# Patient Record
Sex: Female | Born: 1945 | Race: Black or African American | Hispanic: No | Smoking: Never smoker
Health system: Southern US, Community
[De-identification: ages and names within clinical notes are randomized; demographics above are authoritative.]

## PROBLEM LIST (undated history)

## (undated) DIAGNOSIS — K219 Gastro-esophageal reflux disease without esophagitis: Secondary | ICD-10-CM

## (undated) DIAGNOSIS — N183 Chronic kidney disease, stage 3 unspecified: Secondary | ICD-10-CM

## (undated) DIAGNOSIS — I10 Essential (primary) hypertension: Secondary | ICD-10-CM

## (undated) DIAGNOSIS — R011 Cardiac murmur, unspecified: Secondary | ICD-10-CM

## (undated) DIAGNOSIS — Z972 Presence of dental prosthetic device (complete) (partial): Secondary | ICD-10-CM

## (undated) DIAGNOSIS — E785 Hyperlipidemia, unspecified: Secondary | ICD-10-CM

## (undated) DIAGNOSIS — R413 Other amnesia: Secondary | ICD-10-CM

## (undated) DIAGNOSIS — E669 Obesity, unspecified: Secondary | ICD-10-CM

## (undated) DIAGNOSIS — G4730 Sleep apnea, unspecified: Secondary | ICD-10-CM

## (undated) DIAGNOSIS — I2699 Other pulmonary embolism without acute cor pulmonale: Secondary | ICD-10-CM

## (undated) DIAGNOSIS — S63509A Unspecified sprain of unspecified wrist, initial encounter: Secondary | ICD-10-CM

## (undated) DIAGNOSIS — S66919A Strain of unspecified muscle, fascia and tendon at wrist and hand level, unspecified hand, initial encounter: Secondary | ICD-10-CM

## (undated) DIAGNOSIS — M169 Osteoarthritis of hip, unspecified: Secondary | ICD-10-CM

## (undated) DIAGNOSIS — Z973 Presence of spectacles and contact lenses: Secondary | ICD-10-CM

## (undated) DIAGNOSIS — Z9989 Dependence on other enabling machines and devices: Secondary | ICD-10-CM

## (undated) DIAGNOSIS — I82409 Acute embolism and thrombosis of unspecified deep veins of unspecified lower extremity: Secondary | ICD-10-CM

## (undated) DIAGNOSIS — D649 Anemia, unspecified: Secondary | ICD-10-CM

## (undated) HISTORY — DX: Acute embolism and thrombosis of unspecified deep veins of unspecified lower extremity: I82.409

## (undated) HISTORY — DX: Hyperlipidemia, unspecified: E78.5

## (undated) HISTORY — PX: SHOULDER SURGERY: SHX246

## (undated) HISTORY — DX: Essential (primary) hypertension: I10

## (undated) HISTORY — DX: Osteoarthritis of hip, unspecified: M16.9

## (undated) HISTORY — PX: TRANSTHORACIC ECHOCARDIOGRAM: SHX275

## (undated) HISTORY — DX: Obesity, unspecified: E66.9

## (undated) HISTORY — DX: Chronic kidney disease, stage 3 (moderate): N18.3

## (undated) HISTORY — DX: Other pulmonary embolism without acute cor pulmonale: I26.99

## (undated) HISTORY — PX: PARTIAL HIP ARTHROPLASTY: SHX733

## (undated) HISTORY — PX: TOTAL HIP ARTHROPLASTY: SHX124

---
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
## 1979-08-06 HISTORY — PX: TUBAL LIGATION: SHX77

## 1999-06-23 ENCOUNTER — Encounter: Payer: Self-pay | Admitting: Internal Medicine

## 1999-06-23 ENCOUNTER — Ambulatory Visit (HOSPITAL_COMMUNITY): Admission: RE | Admit: 1999-06-23 | Discharge: 1999-06-23 | Payer: Self-pay | Admitting: Internal Medicine

## 1999-07-23 ENCOUNTER — Encounter: Payer: Self-pay | Admitting: Emergency Medicine

## 1999-07-23 ENCOUNTER — Emergency Department (HOSPITAL_COMMUNITY): Admission: EM | Admit: 1999-07-23 | Discharge: 1999-07-23 | Payer: Self-pay | Admitting: Emergency Medicine

## 1999-10-11 ENCOUNTER — Encounter: Payer: Self-pay | Admitting: Internal Medicine

## 1999-10-11 ENCOUNTER — Ambulatory Visit (HOSPITAL_COMMUNITY): Admission: RE | Admit: 1999-10-11 | Discharge: 1999-10-11 | Payer: Self-pay | Admitting: Internal Medicine

## 2000-02-15 ENCOUNTER — Other Ambulatory Visit: Admission: RE | Admit: 2000-02-15 | Discharge: 2000-02-15 | Payer: Self-pay | Admitting: Family Medicine

## 2000-09-16 ENCOUNTER — Ambulatory Visit (HOSPITAL_COMMUNITY): Admission: RE | Admit: 2000-09-16 | Discharge: 2000-09-16 | Payer: Self-pay | Admitting: Family Medicine

## 2001-08-25 ENCOUNTER — Encounter: Payer: Self-pay | Admitting: Emergency Medicine

## 2001-08-25 ENCOUNTER — Emergency Department (HOSPITAL_COMMUNITY): Admission: EM | Admit: 2001-08-25 | Discharge: 2001-08-26 | Payer: Self-pay | Admitting: Emergency Medicine

## 2002-02-25 ENCOUNTER — Encounter: Payer: Self-pay | Admitting: Internal Medicine

## 2002-02-25 ENCOUNTER — Ambulatory Visit (HOSPITAL_COMMUNITY): Admission: RE | Admit: 2002-02-25 | Discharge: 2002-02-25 | Payer: Self-pay | Admitting: Internal Medicine

## 2002-12-04 HISTORY — PX: OTHER SURGICAL HISTORY: SHX169

## 2003-03-04 ENCOUNTER — Encounter: Payer: Self-pay | Admitting: Family Medicine

## 2003-03-04 ENCOUNTER — Ambulatory Visit (HOSPITAL_COMMUNITY): Admission: RE | Admit: 2003-03-04 | Discharge: 2003-03-04 | Payer: Self-pay | Admitting: Family Medicine

## 2003-04-15 ENCOUNTER — Encounter: Payer: Self-pay | Admitting: Orthopedic Surgery

## 2003-04-18 ENCOUNTER — Inpatient Hospital Stay (HOSPITAL_COMMUNITY): Admission: RE | Admit: 2003-04-18 | Discharge: 2003-04-22 | Payer: Self-pay | Admitting: Orthopedic Surgery

## 2003-04-18 ENCOUNTER — Encounter: Payer: Self-pay | Admitting: Orthopedic Surgery

## 2003-04-22 ENCOUNTER — Inpatient Hospital Stay (HOSPITAL_COMMUNITY)
Admission: RE | Admit: 2003-04-22 | Discharge: 2003-04-29 | Payer: Self-pay | Admitting: Physical Medicine & Rehabilitation

## 2003-09-01 ENCOUNTER — Inpatient Hospital Stay (HOSPITAL_COMMUNITY): Admission: EM | Admit: 2003-09-01 | Discharge: 2003-09-29 | Payer: Self-pay | Admitting: Emergency Medicine

## 2003-09-04 ENCOUNTER — Encounter: Payer: Self-pay | Admitting: Cardiovascular Disease

## 2003-10-03 ENCOUNTER — Encounter: Admission: RE | Admit: 2003-10-03 | Discharge: 2003-10-03 | Payer: Self-pay | Admitting: Family Medicine

## 2003-10-17 ENCOUNTER — Encounter: Admission: RE | Admit: 2003-10-17 | Discharge: 2003-10-17 | Payer: Self-pay | Admitting: Sports Medicine

## 2003-11-03 HISTORY — PX: OTHER SURGICAL HISTORY: SHX169

## 2004-01-26 ENCOUNTER — Encounter: Admission: RE | Admit: 2004-01-26 | Discharge: 2004-01-26 | Payer: Self-pay | Admitting: Family Medicine

## 2004-02-02 ENCOUNTER — Encounter: Admission: RE | Admit: 2004-02-02 | Discharge: 2004-02-02 | Payer: Self-pay | Admitting: Family Medicine

## 2004-02-07 ENCOUNTER — Encounter: Admission: RE | Admit: 2004-02-07 | Discharge: 2004-02-07 | Payer: Self-pay | Admitting: Sports Medicine

## 2004-05-01 ENCOUNTER — Ambulatory Visit: Payer: Self-pay | Admitting: Family Medicine

## 2004-06-08 ENCOUNTER — Ambulatory Visit: Payer: Self-pay | Admitting: Cardiovascular Disease

## 2004-06-18 ENCOUNTER — Ambulatory Visit: Payer: Self-pay | Admitting: Cardiology

## 2004-07-02 ENCOUNTER — Ambulatory Visit: Payer: Self-pay | Admitting: Internal Medicine

## 2004-07-09 ENCOUNTER — Ambulatory Visit: Payer: Self-pay | Admitting: Cardiology

## 2004-07-16 ENCOUNTER — Ambulatory Visit: Payer: Self-pay | Admitting: Cardiology

## 2004-08-02 ENCOUNTER — Ambulatory Visit: Payer: Self-pay | Admitting: Cardiovascular Disease

## 2004-08-16 ENCOUNTER — Ambulatory Visit: Payer: Self-pay | Admitting: Internal Medicine

## 2004-08-23 ENCOUNTER — Ambulatory Visit: Payer: Self-pay | Admitting: Cardiology

## 2004-08-30 ENCOUNTER — Ambulatory Visit: Payer: Self-pay | Admitting: Sports Medicine

## 2004-09-03 ENCOUNTER — Ambulatory Visit: Payer: Self-pay | Admitting: Internal Medicine

## 2004-09-05 ENCOUNTER — Ambulatory Visit: Payer: Self-pay | Admitting: Family Medicine

## 2004-09-20 ENCOUNTER — Ambulatory Visit: Payer: Self-pay | Admitting: Cardiology

## 2004-10-04 ENCOUNTER — Ambulatory Visit: Payer: Self-pay | Admitting: Internal Medicine

## 2005-01-21 ENCOUNTER — Ambulatory Visit: Payer: Self-pay | Admitting: Family Medicine

## 2005-01-21 ENCOUNTER — Ambulatory Visit: Payer: Self-pay | Admitting: Cardiology

## 2005-01-30 ENCOUNTER — Ambulatory Visit: Payer: Self-pay | Admitting: Cardiology

## 2005-02-04 ENCOUNTER — Ambulatory Visit: Payer: Self-pay | Admitting: Cardiology

## 2005-02-12 ENCOUNTER — Ambulatory Visit: Payer: Self-pay | Admitting: *Deleted

## 2005-02-26 ENCOUNTER — Ambulatory Visit: Payer: Self-pay | Admitting: Cardiology

## 2005-03-12 ENCOUNTER — Ambulatory Visit: Payer: Self-pay | Admitting: Cardiology

## 2005-03-19 ENCOUNTER — Ambulatory Visit: Payer: Self-pay | Admitting: Cardiology

## 2005-03-25 ENCOUNTER — Ambulatory Visit: Payer: Self-pay | Admitting: Cardiology

## 2005-04-16 ENCOUNTER — Ambulatory Visit: Payer: Self-pay | Admitting: *Deleted

## 2005-05-07 ENCOUNTER — Ambulatory Visit: Payer: Self-pay | Admitting: Internal Medicine

## 2005-07-24 ENCOUNTER — Ambulatory Visit: Payer: Self-pay | Admitting: Internal Medicine

## 2005-08-21 ENCOUNTER — Ambulatory Visit: Payer: Self-pay | Admitting: Cardiology

## 2005-08-28 ENCOUNTER — Ambulatory Visit: Payer: Self-pay | Admitting: Cardiology

## 2005-09-27 ENCOUNTER — Ambulatory Visit: Payer: Self-pay | Admitting: Internal Medicine

## 2005-11-14 ENCOUNTER — Ambulatory Visit: Payer: Self-pay | Admitting: Cardiology

## 2005-11-22 ENCOUNTER — Ambulatory Visit: Payer: Self-pay | Admitting: Family Medicine

## 2005-12-09 ENCOUNTER — Encounter (INDEPENDENT_AMBULATORY_CARE_PROVIDER_SITE_OTHER): Payer: Self-pay | Admitting: *Deleted

## 2005-12-09 LAB — CONVERTED CEMR LAB

## 2005-12-23 ENCOUNTER — Ambulatory Visit: Payer: Self-pay | Admitting: Family Medicine

## 2005-12-31 ENCOUNTER — Ambulatory Visit: Payer: Self-pay | Admitting: Sports Medicine

## 2006-01-02 ENCOUNTER — Encounter: Admission: RE | Admit: 2006-01-02 | Discharge: 2006-01-02 | Payer: Self-pay | Admitting: Sports Medicine

## 2006-01-15 ENCOUNTER — Ambulatory Visit: Payer: Self-pay | Admitting: Family Medicine

## 2006-01-29 ENCOUNTER — Ambulatory Visit: Payer: Self-pay | Admitting: Family Medicine

## 2006-02-10 ENCOUNTER — Ambulatory Visit: Payer: Self-pay | Admitting: Family Medicine

## 2006-02-26 ENCOUNTER — Ambulatory Visit: Payer: Self-pay | Admitting: Sports Medicine

## 2006-03-05 ENCOUNTER — Ambulatory Visit: Payer: Self-pay | Admitting: Family Medicine

## 2006-03-18 ENCOUNTER — Ambulatory Visit: Payer: Self-pay | Admitting: Family Medicine

## 2006-03-23 ENCOUNTER — Emergency Department (HOSPITAL_COMMUNITY): Admission: EM | Admit: 2006-03-23 | Discharge: 2006-03-23 | Payer: Self-pay | Admitting: Emergency Medicine

## 2006-03-25 ENCOUNTER — Ambulatory Visit: Payer: Self-pay | Admitting: Family Medicine

## 2006-04-21 ENCOUNTER — Ambulatory Visit: Payer: Self-pay | Admitting: Sports Medicine

## 2006-05-13 ENCOUNTER — Ambulatory Visit: Payer: Self-pay | Admitting: Family Medicine

## 2006-06-13 ENCOUNTER — Ambulatory Visit: Payer: Self-pay | Admitting: Family Medicine

## 2006-06-20 ENCOUNTER — Ambulatory Visit: Payer: Self-pay | Admitting: Family Medicine

## 2006-07-04 ENCOUNTER — Ambulatory Visit: Payer: Self-pay | Admitting: Family Medicine

## 2006-08-27 ENCOUNTER — Ambulatory Visit: Payer: Self-pay | Admitting: Family Medicine

## 2006-08-27 ENCOUNTER — Encounter (INDEPENDENT_AMBULATORY_CARE_PROVIDER_SITE_OTHER): Payer: Self-pay | Admitting: Family Medicine

## 2006-08-27 LAB — CONVERTED CEMR LAB
BUN: 28 mg/dL — ABNORMAL HIGH (ref 6–23)
CO2: 26 meq/L (ref 19–32)
Calcium: 9.6 mg/dL (ref 8.4–10.5)
Chloride: 104 meq/L (ref 96–112)
Creatinine, Ser: 1.46 mg/dL — ABNORMAL HIGH (ref 0.40–1.20)
Glucose, Bld: 94 mg/dL (ref 70–99)
Potassium: 4.2 meq/L (ref 3.5–5.3)
Sodium: 139 meq/L (ref 135–145)

## 2006-10-02 DIAGNOSIS — E785 Hyperlipidemia, unspecified: Secondary | ICD-10-CM | POA: Insufficient documentation

## 2006-10-02 DIAGNOSIS — D649 Anemia, unspecified: Secondary | ICD-10-CM

## 2006-10-02 DIAGNOSIS — Z8679 Personal history of other diseases of the circulatory system: Secondary | ICD-10-CM | POA: Insufficient documentation

## 2006-10-02 DIAGNOSIS — E669 Obesity, unspecified: Secondary | ICD-10-CM | POA: Insufficient documentation

## 2006-10-02 DIAGNOSIS — I10 Essential (primary) hypertension: Secondary | ICD-10-CM | POA: Insufficient documentation

## 2006-10-02 HISTORY — DX: Anemia, unspecified: D64.9

## 2006-10-03 ENCOUNTER — Encounter (INDEPENDENT_AMBULATORY_CARE_PROVIDER_SITE_OTHER): Payer: Self-pay | Admitting: *Deleted

## 2007-03-02 ENCOUNTER — Ambulatory Visit: Payer: Self-pay | Admitting: Family Medicine

## 2007-03-02 LAB — CONVERTED CEMR LAB: INR: 1.8

## 2007-03-25 ENCOUNTER — Ambulatory Visit: Payer: Self-pay | Admitting: Family Medicine

## 2007-03-25 ENCOUNTER — Encounter (INDEPENDENT_AMBULATORY_CARE_PROVIDER_SITE_OTHER): Payer: Self-pay | Admitting: Family Medicine

## 2007-03-25 DIAGNOSIS — N183 Chronic kidney disease, stage 3 unspecified: Secondary | ICD-10-CM | POA: Insufficient documentation

## 2007-03-25 LAB — CONVERTED CEMR LAB
ALT: 13 units/L (ref 0–35)
AST: 16 units/L (ref 0–37)
Albumin: 4.2 g/dL (ref 3.5–5.2)
Alkaline Phosphatase: 73 units/L (ref 39–117)
BUN: 19 mg/dL (ref 6–23)
CO2: 27 meq/L (ref 19–32)
Calcium: 9.3 mg/dL (ref 8.4–10.5)
Chloride: 104 meq/L (ref 96–112)
Creatinine, Ser: 1.28 mg/dL — ABNORMAL HIGH (ref 0.40–1.20)
Glucose, Bld: 88 mg/dL (ref 70–99)
HCT: 39.8 % (ref 36.0–46.0)
Hemoglobin: 13 g/dL (ref 12.0–15.0)
INR: 2.9
MCHC: 32.7 g/dL (ref 30.0–36.0)
MCV: 88.6 fL (ref 78.0–100.0)
Platelets: 294 10*3/uL (ref 150–400)
Potassium: 3.6 meq/L (ref 3.5–5.3)
RBC: 4.49 M/uL (ref 3.87–5.11)
RDW: 14.8 % — ABNORMAL HIGH (ref 11.5–14.0)
Sodium: 141 meq/L (ref 135–145)
Total Bilirubin: 0.5 mg/dL (ref 0.3–1.2)
Total Protein: 7.2 g/dL (ref 6.0–8.3)
WBC: 8.2 10*3/uL (ref 4.0–10.5)

## 2007-03-30 ENCOUNTER — Ambulatory Visit: Payer: Self-pay | Admitting: Family Medicine

## 2007-04-08 ENCOUNTER — Ambulatory Visit: Admission: RE | Admit: 2007-04-08 | Discharge: 2007-04-08 | Payer: Self-pay | Admitting: Sports Medicine

## 2007-04-08 ENCOUNTER — Ambulatory Visit: Payer: Self-pay | Admitting: Surgery

## 2007-04-08 ENCOUNTER — Ambulatory Visit: Payer: Self-pay | Admitting: Family Medicine

## 2007-08-04 ENCOUNTER — Telehealth: Payer: Self-pay | Admitting: *Deleted

## 2007-08-13 ENCOUNTER — Ambulatory Visit: Payer: Self-pay | Admitting: Family Medicine

## 2007-08-13 ENCOUNTER — Encounter (INDEPENDENT_AMBULATORY_CARE_PROVIDER_SITE_OTHER): Payer: Self-pay | Admitting: Family Medicine

## 2007-08-13 LAB — CONVERTED CEMR LAB
BUN: 24 mg/dL — ABNORMAL HIGH (ref 6–23)
CO2: 24 meq/L (ref 19–32)
Calcium: 9.6 mg/dL (ref 8.4–10.5)
Chloride: 102 meq/L (ref 96–112)
Creatinine, Ser: 1.54 mg/dL — ABNORMAL HIGH (ref 0.40–1.20)
Glucose, Bld: 95 mg/dL (ref 70–99)
INR: 2.8
Potassium: 3.8 meq/L (ref 3.5–5.3)
Sodium: 140 meq/L (ref 135–145)

## 2007-09-16 ENCOUNTER — Ambulatory Visit: Payer: Self-pay | Admitting: Family Medicine

## 2007-11-17 ENCOUNTER — Telehealth: Payer: Self-pay | Admitting: *Deleted

## 2008-03-17 ENCOUNTER — Encounter: Payer: Self-pay | Admitting: *Deleted

## 2008-03-23 ENCOUNTER — Encounter: Payer: Self-pay | Admitting: Family Medicine

## 2008-03-27 ENCOUNTER — Ambulatory Visit: Payer: Self-pay | Admitting: Vascular Surgery

## 2008-03-27 ENCOUNTER — Emergency Department (HOSPITAL_COMMUNITY): Admission: EM | Admit: 2008-03-27 | Discharge: 2008-03-27 | Payer: Self-pay | Admitting: Emergency Medicine

## 2008-03-27 ENCOUNTER — Encounter (INDEPENDENT_AMBULATORY_CARE_PROVIDER_SITE_OTHER): Payer: Self-pay | Admitting: Emergency Medicine

## 2008-03-30 ENCOUNTER — Telehealth (INDEPENDENT_AMBULATORY_CARE_PROVIDER_SITE_OTHER): Payer: Self-pay | Admitting: *Deleted

## 2008-04-01 ENCOUNTER — Encounter: Payer: Self-pay | Admitting: Family Medicine

## 2008-04-07 ENCOUNTER — Encounter: Payer: Self-pay | Admitting: Family Medicine

## 2008-04-07 ENCOUNTER — Ambulatory Visit: Payer: Self-pay | Admitting: Family Medicine

## 2008-04-07 DIAGNOSIS — M169 Osteoarthritis of hip, unspecified: Secondary | ICD-10-CM | POA: Insufficient documentation

## 2008-04-07 DIAGNOSIS — M1610 Unilateral primary osteoarthritis, unspecified hip: Secondary | ICD-10-CM | POA: Insufficient documentation

## 2008-04-07 LAB — CONVERTED CEMR LAB
ALT: 27 units/L (ref 0–35)
AST: 17 units/L (ref 0–37)
Albumin: 4.2 g/dL (ref 3.5–5.2)
Alkaline Phosphatase: 71 units/L (ref 39–117)
BUN: 20 mg/dL (ref 6–23)
CO2: 29 meq/L (ref 19–32)
Calcium: 9.7 mg/dL (ref 8.4–10.5)
Chloride: 101 meq/L (ref 96–112)
Creatinine, Ser: 1.27 mg/dL — ABNORMAL HIGH (ref 0.40–1.20)
Glucose, Bld: 92 mg/dL (ref 70–99)
Potassium: 4.5 meq/L (ref 3.5–5.3)
Sodium: 140 meq/L (ref 135–145)
Total Bilirubin: 0.4 mg/dL (ref 0.3–1.2)
Total Protein: 7.3 g/dL (ref 6.0–8.3)

## 2008-04-08 ENCOUNTER — Telehealth: Payer: Self-pay | Admitting: *Deleted

## 2008-05-05 ENCOUNTER — Encounter: Payer: Self-pay | Admitting: Family Medicine

## 2008-05-05 ENCOUNTER — Ambulatory Visit: Payer: Self-pay | Admitting: Family Medicine

## 2008-05-05 LAB — CONVERTED CEMR LAB
Cholesterol: 218 mg/dL — ABNORMAL HIGH (ref 0–200)
HDL: 53 mg/dL (ref >39)
INR: 3.1
LDL Cholesterol: 145 mg/dL — ABNORMAL HIGH (ref 0–99)
Total CHOL/HDL Ratio: 4.1
Triglycerides: 98 mg/dL (ref <150)
VLDL: 20 mg/dL (ref 0–40)

## 2008-05-06 ENCOUNTER — Encounter: Payer: Self-pay | Admitting: Family Medicine

## 2008-05-06 ENCOUNTER — Ambulatory Visit: Payer: Self-pay | Admitting: Family Medicine

## 2008-05-06 LAB — CONVERTED CEMR LAB: Pap Smear: NORMAL

## 2008-05-13 ENCOUNTER — Encounter: Admission: RE | Admit: 2008-05-13 | Discharge: 2008-05-13 | Payer: Self-pay | Admitting: Family Medicine

## 2008-05-26 ENCOUNTER — Encounter: Payer: Self-pay | Admitting: Family Medicine

## 2008-06-22 ENCOUNTER — Encounter: Payer: Self-pay | Admitting: Family Medicine

## 2008-06-23 ENCOUNTER — Ambulatory Visit: Payer: Self-pay | Admitting: Family Medicine

## 2008-06-23 ENCOUNTER — Encounter: Payer: Self-pay | Admitting: Family Medicine

## 2008-06-23 LAB — CONVERTED CEMR LAB: Direct LDL: 99 mg/dL

## 2008-07-15 ENCOUNTER — Ambulatory Visit (HOSPITAL_COMMUNITY): Admission: RE | Admit: 2008-07-15 | Discharge: 2008-07-15 | Payer: Self-pay | Admitting: Orthopedic Surgery

## 2008-09-08 ENCOUNTER — Encounter: Payer: Self-pay | Admitting: Family Medicine

## 2008-10-13 ENCOUNTER — Telehealth: Payer: Self-pay | Admitting: Family Medicine

## 2008-11-28 ENCOUNTER — Ambulatory Visit: Payer: Self-pay | Admitting: Family Medicine

## 2008-11-28 LAB — CONVERTED CEMR LAB
Cholesterol, target level: 200 mg/dL
HDL goal, serum: 40 mg/dL
INR: 5
LDL Goal: 130 mg/dL

## 2008-12-05 ENCOUNTER — Ambulatory Visit: Payer: Self-pay | Admitting: Family Medicine

## 2008-12-05 LAB — CONVERTED CEMR LAB: INR: 2.2

## 2008-12-19 ENCOUNTER — Ambulatory Visit: Payer: Self-pay | Admitting: Family Medicine

## 2008-12-19 LAB — CONVERTED CEMR LAB: INR: 2.3

## 2009-01-09 ENCOUNTER — Ambulatory Visit: Payer: Self-pay | Admitting: Family Medicine

## 2009-01-09 LAB — CONVERTED CEMR LAB: INR: 1.7

## 2009-02-03 ENCOUNTER — Inpatient Hospital Stay (HOSPITAL_COMMUNITY): Admission: RE | Admit: 2009-02-03 | Discharge: 2009-02-07 | Payer: Self-pay | Admitting: Orthopedic Surgery

## 2009-02-08 ENCOUNTER — Encounter: Payer: Self-pay | Admitting: Family Medicine

## 2009-02-10 ENCOUNTER — Encounter: Payer: Self-pay | Admitting: *Deleted

## 2009-02-16 ENCOUNTER — Telehealth: Payer: Self-pay | Admitting: Family Medicine

## 2009-02-22 ENCOUNTER — Encounter: Payer: Self-pay | Admitting: Family Medicine

## 2009-02-23 ENCOUNTER — Encounter: Payer: Self-pay | Admitting: Family Medicine

## 2009-02-23 LAB — CONVERTED CEMR LAB: INR: 3.7

## 2009-03-02 ENCOUNTER — Encounter (INDEPENDENT_AMBULATORY_CARE_PROVIDER_SITE_OTHER): Payer: Self-pay | Admitting: *Deleted

## 2009-03-10 ENCOUNTER — Telehealth: Payer: Self-pay | Admitting: Family Medicine

## 2009-04-14 ENCOUNTER — Ambulatory Visit: Payer: Self-pay | Admitting: Family Medicine

## 2009-04-15 ENCOUNTER — Encounter: Payer: Self-pay | Admitting: Family Medicine

## 2009-04-28 ENCOUNTER — Telehealth (INDEPENDENT_AMBULATORY_CARE_PROVIDER_SITE_OTHER): Payer: Self-pay | Admitting: *Deleted

## 2009-05-05 ENCOUNTER — Encounter: Payer: Self-pay | Admitting: Family Medicine

## 2009-05-05 ENCOUNTER — Ambulatory Visit: Payer: Self-pay | Admitting: Family Medicine

## 2009-05-05 LAB — CONVERTED CEMR LAB
ALT: 12 units/L (ref 0–35)
AST: 18 units/L (ref 0–37)
Albumin: 4.4 g/dL (ref 3.5–5.2)
Alkaline Phosphatase: 104 units/L (ref 39–117)
BUN: 29 mg/dL — ABNORMAL HIGH (ref 6–23)
CO2: 23 meq/L (ref 19–32)
Calcium: 9.7 mg/dL (ref 8.4–10.5)
Chloride: 103 meq/L (ref 96–112)
Cholesterol: 165 mg/dL (ref 0–200)
Creatinine, Ser: 1.65 mg/dL — ABNORMAL HIGH (ref 0.40–1.20)
Glucose, Bld: 86 mg/dL (ref 70–99)
HCT: 39.7 % (ref 36.0–46.0)
HDL: 49 mg/dL (ref >39)
Hemoglobin: 12.3 g/dL (ref 12.0–15.0)
INR: 3.3
LDL Cholesterol: 94 mg/dL (ref 0–99)
MCHC: 31 g/dL (ref 30.0–36.0)
MCV: 89.2 fL (ref 78.0–100.0)
Platelets: 311 10*3/uL (ref 150–400)
Potassium: 3.8 meq/L (ref 3.5–5.3)
RBC: 4.45 M/uL (ref 3.87–5.11)
RDW: 15.7 % — ABNORMAL HIGH (ref 11.5–15.5)
Sodium: 142 meq/L (ref 135–145)
Total Bilirubin: 0.4 mg/dL (ref 0.3–1.2)
Total CHOL/HDL Ratio: 3.4
Total Protein: 7.5 g/dL (ref 6.0–8.3)
Triglycerides: 108 mg/dL (ref <150)
VLDL: 22 mg/dL (ref 0–40)
WBC: 8.2 10*3/uL (ref 4.0–10.5)

## 2009-05-12 ENCOUNTER — Telehealth: Payer: Self-pay | Admitting: Family Medicine

## 2009-08-18 ENCOUNTER — Telehealth (INDEPENDENT_AMBULATORY_CARE_PROVIDER_SITE_OTHER): Payer: Self-pay | Admitting: *Deleted

## 2009-08-24 ENCOUNTER — Ambulatory Visit: Payer: Self-pay | Admitting: Family Medicine

## 2009-08-24 DIAGNOSIS — I8290 Acute embolism and thrombosis of unspecified vein: Secondary | ICD-10-CM

## 2009-08-24 HISTORY — DX: Acute embolism and thrombosis of unspecified vein: I82.90

## 2009-08-24 LAB — CONVERTED CEMR LAB: INR: 1.7

## 2009-09-06 ENCOUNTER — Ambulatory Visit: Payer: Self-pay | Admitting: Critical Care Medicine

## 2009-09-07 ENCOUNTER — Ambulatory Visit: Payer: Self-pay | Admitting: Family Medicine

## 2009-09-07 ENCOUNTER — Encounter: Payer: Self-pay | Admitting: Family Medicine

## 2009-09-07 LAB — CONVERTED CEMR LAB
ALT: 13 units/L (ref 0–35)
AST: 19 units/L (ref 0–37)
Albumin: 4.2 g/dL (ref 3.5–5.2)
Alkaline Phosphatase: 106 units/L (ref 39–117)
BUN: 25 mg/dL — ABNORMAL HIGH (ref 6–23)
CO2: 24 meq/L (ref 19–32)
Calcium: 9.9 mg/dL (ref 8.4–10.5)
Chloride: 103 meq/L (ref 96–112)
Cholesterol: 149 mg/dL (ref 0–200)
Creatinine, Ser: 1.37 mg/dL — ABNORMAL HIGH (ref 0.40–1.20)
Glucose, Bld: 93 mg/dL (ref 70–99)
HCT: 41.3 % (ref 36.0–46.0)
HDL: 47 mg/dL (ref >39)
Hemoglobin: 13 g/dL (ref 12.0–15.0)
INR: 3.4
LDL Cholesterol: 89 mg/dL (ref 0–99)
MCHC: 31.5 g/dL (ref 30.0–36.0)
MCV: 92 fL (ref 78.0–100.0)
Platelets: 380 10*3/uL (ref 150–400)
Potassium: 3.8 meq/L (ref 3.5–5.3)
RBC: 4.49 M/uL (ref 3.87–5.11)
RDW: 14.9 % (ref 11.5–15.5)
Sodium: 141 meq/L (ref 135–145)
Total Bilirubin: 0.4 mg/dL (ref 0.3–1.2)
Total CHOL/HDL Ratio: 3.2
Total Protein: 7.5 g/dL (ref 6.0–8.3)
Triglycerides: 63 mg/dL (ref <150)
VLDL: 13 mg/dL (ref 0–40)
Vit D, 25-Hydroxy: 18 ng/mL — ABNORMAL LOW (ref 30–89)
WBC: 6.7 10*3/uL (ref 4.0–10.5)

## 2009-09-10 ENCOUNTER — Encounter: Payer: Self-pay | Admitting: Family Medicine

## 2009-09-15 ENCOUNTER — Encounter: Admission: RE | Admit: 2009-09-15 | Discharge: 2009-09-15 | Payer: Self-pay | Admitting: Family Medicine

## 2009-09-21 ENCOUNTER — Ambulatory Visit: Payer: Self-pay | Admitting: Family Medicine

## 2009-09-21 LAB — CONVERTED CEMR LAB: INR: 2.6

## 2009-10-05 ENCOUNTER — Ambulatory Visit: Payer: Self-pay | Admitting: Family Medicine

## 2009-10-05 LAB — CONVERTED CEMR LAB: INR: 2.8

## 2009-10-27 ENCOUNTER — Ambulatory Visit: Payer: Self-pay | Admitting: Family Medicine

## 2009-10-27 LAB — CONVERTED CEMR LAB: INR: 2.1

## 2009-11-15 ENCOUNTER — Telehealth: Payer: Self-pay | Admitting: Internal Medicine

## 2009-11-16 ENCOUNTER — Ambulatory Visit: Payer: Self-pay

## 2009-11-16 ENCOUNTER — Encounter: Payer: Self-pay | Admitting: Critical Care Medicine

## 2009-11-16 ENCOUNTER — Ambulatory Visit: Payer: Self-pay | Admitting: Internal Medicine

## 2009-11-16 ENCOUNTER — Encounter: Payer: Self-pay | Admitting: Adult Health

## 2009-11-16 LAB — CONVERTED CEMR LAB
INR: 3.1 — ABNORMAL HIGH (ref 0.8–1.0)
Prothrombin Time: 31.7 s — ABNORMAL HIGH (ref 9.1–11.7)

## 2009-11-23 ENCOUNTER — Ambulatory Visit: Payer: Self-pay | Admitting: Family Medicine

## 2009-11-23 LAB — CONVERTED CEMR LAB: INR: 2.3

## 2009-11-28 ENCOUNTER — Telehealth: Payer: Self-pay | Admitting: Family Medicine

## 2009-11-29 ENCOUNTER — Ambulatory Visit: Payer: Self-pay | Admitting: Family Medicine

## 2010-01-15 ENCOUNTER — Ambulatory Visit: Payer: Self-pay | Admitting: Family Medicine

## 2010-01-15 LAB — CONVERTED CEMR LAB: INR: 2.7

## 2010-05-14 ENCOUNTER — Encounter: Payer: Self-pay | Admitting: Family Medicine

## 2010-05-21 ENCOUNTER — Encounter: Payer: Self-pay | Admitting: Family Medicine

## 2010-06-13 ENCOUNTER — Encounter: Payer: Self-pay | Admitting: Family Medicine

## 2010-09-04 NOTE — Progress Notes (Signed)
Summary: knots behind knee/ previous PE  Phone Note Call from Patient Call back at Home Phone [phone]   Caller: Patient Call For: wright Summary of Call: pt has a hx of pulm embolism. today c/o of swollen knots behind her right leg/ painful from calf to bend of the knee. says this has been going on for "about 2 wks". please advise asap.  Initial call taken by: Tivis Ringer, CNA,  November 15, 2009 2:57 PM  Follow-up for Phone Call        pt c/o pain in her right calf and states it feels like she has a "ball" in her calf as well as some small knots. She states this has been x 2 weeks. She also states this is the same feeling she had when she had a pulm. embolism in the past. I advised pt she needed to come in for OV. Ov set with MW at 3:45, ok per TD. Carron Curie CMA  November 15, 2009 3:20 PM      Appended Document: knots behind knee/ previous PE pt cancelled appt today with MW because she was unable to get here. I called and r/s pt to see TP tomorrow at 10:45am.

## 2010-09-04 NOTE — Assessment & Plan Note (Signed)
Summary: F/U LEG WOUND/PER [facility]   Vital Signs:  Patient Profile:   65 Years Old Female Weight:      294 pounds (133.64 kg) Temp:     98.1 degrees F (36.72 degrees C) Pulse rate:   67 / minute BP sitting:   147 / 66  (left arm)  Pt. in pain?   yes    Location:   R leg    Intensity:   4  Vitals Entered By: Tomasa Rand (April 08, 2007 10:03 AM)                  Chief Complaint:  F/U leg wound.  History of Present Illness: S: Patient here today to f/u leg ulcer and also discuss leg pain. 1. ulcer- Patient wore unna boot for about one week she states she took it off 9/2.  Ulcer has gotten much better and has almost completely healed.  No surrounding erythema or exudate. 2. leg pain-  Patient has bilateral pain in the backs of her legs during day and night.  Not similar to pain with previous DVT.  Denies CP and SOB 3. HTN- Patient is tolerating BP meds and denies side effects.  SHe said she reviewed meds with pharmacist and has a schedule now for taking them  Current Allergies: No known allergies   Past Medical History:    Reviewed history from 03/25/2007 and no changes required:       Chronic Kidney Disaese baseline 1.4,        Degenerative  joint disease , R hip,        Recurrent  DVT/pulmonary  emboli--hypercoagulation workup negative       HTN       hyperlipidemia       Obesity   Family History:    Reviewed history from 10/02/2006 and no changes required:       CVA - siblings, Diabetes 1st degree, Heart disease - parents, Lymphoma - brother, Phlebitis - mother, Pulmonary embolism -sibling  Social History:    Reviewed history from 10/02/2006 and no changes required:       Former school Midwife. Unemployed since 2004 bec  of disability (severe R hip pain). Lives at home with  daughter born in '87. Non smoker. 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  Currently has Lorin Picket who has mulptiple medical issue and Down's thinking  about adopting her     Physical Exam  General:     Well-developed,well-nourished,in no acute distress; alert,appropriate and cooperative throughout examination. obese Head:     normocephalic and atraumatic.   Lungs:     Normal respiratory effort, chest expands symmetrically. Lungs are clear to auscultation, no crackles or wheezes. Heart:     Normal rate and regular rhythm. S1 and S2 normal without gallop, murmur, click, rub or other extra sounds. Msk:     mild ttp in lower calves bilaterally. No palpable cords. Homans negative Extremities:       Skin:     patient has small superficial ulcer on right medial lower extremity with surrounding venous stasis changes.  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  Patient was instructed on proper wound care (see patient instructions).  WIll not repeat Elveria Rising  Problem # 2:  CALF PAIN, BILATERAL (ICD-729.5) Assessment: New Likely benign but given h/o PE will order lower extremity dopplers  Orders: Ultrasound (Ultrasound) [facility]- Est  Level 4 (24401)   Problem # 3:  HYPERTENSION, BENIGN SYSTEMIC (ICD-401.1) Assessment: Improved Better but still not at goal. Patient recently non-compliant with meds due to family issues. Continue current meds Her updated medication list for this problem includes:    Hydrochlorothiazide 25 Mg Tabs (Hydrochlorothiazide) .Marland Kitchen... Take 1 tablet by mouth once a day    Lasix 20 Mg Tabs (Furosemide) .Marland Kitchen... Take 1 tablet once a day    Spironolactone 25 Mg Tabs (Spironolactone) .Marland Kitchen... Take 1 tablet by mouth once a day    Verapamil Hcl Cr 240 Mg Tbcr (Verapamil hcl) .Marland Kitchen... Take 1 tablet by mouth at bedtime    Spironolactone 25 Mg Tabs (Spironolactone) .Marland Kitchen... Take 1 tablet by mouth once a day    Toprol Xl 50 Mg Tb24 (Metoprolol succinate) ..... One tablet by mouth qday  Orders: [facility]- Est  Level 4  (02725)   Complete Medication List: 1)  Coumadin 5 Mg Tabs (Warfarin sodium) .... Take as directed 2)  Ferrous Sulfate 325 (65 Fe) Mg Tabs (Ferrous sulfate) .... Take 1 tablet by mouth twice a day 3)  Hydrochlorothiazide 25 Mg Tabs (Hydrochlorothiazide) .... Take 1 tablet by mouth once a day 4)  Lasix 20 Mg Tabs (Furosemide) .... Take 1 tablet once a day 5)  Lipitor 10 Mg Tabs (Atorvastatin calcium) .... Take 1 tablet by mouth once a day 6)  Spironolactone 25 Mg Tabs (Spironolactone) .... Take 1 tablet by mouth once a day 7)  Verapamil Hcl Cr 240 Mg Tbcr (Verapamil hcl) .... Take 1 tablet by mouth at bedtime 8)  Spironolactone 25 Mg Tabs (Spironolactone) .... Take 1 tablet by mouth once a day 9)  Toprol Xl 50 Mg Tb24 (Metoprolol succinate) .... One tablet by mouth qday   Patient Instructions: 1)  please put the antibiotic ointment cream on you leg (1/2 packet twice a day)  Would recommend stopping the peroxide and alcohol 2)  schedule mammogram as soon as you can 3)  We have ordered dopplers to evaluate your leg pain and make sure you don't have a clot 4)  please f/u in 2 months or sooner if you have any problems

## 2010-09-04 NOTE — Miscellaneous (Signed)
Summary: Changing stage of CKD  Clinical Lists Changes GFR 49.15  Problems: Changed problem from RENAL INSUFFICIENCY, CHRONIC (ICD-585.9) to CHRONIC KIDNEY DISEASE STAGE III (MODERATE) (ICD-585.3)

## 2010-09-04 NOTE — Assessment & Plan Note (Signed)
Summary: FU AND LAB/KH   Vital Signs:  Patient profile:   65 year old female Height:      65 inches Weight:      256 pounds BMI:     42.Sharon Temp:     98.2 degrees Sharp oral Pulse rate:   81 / minute BP sitting:   135 / 80  (left arm) Cuff size:   large  Vitals Entered By: Tessie Fass CMA (January 15, 2010 10:37 AM) CC: Sharp/U Is Patient Diabetic? No Pain Assessment Patient in pain? no        Primary Care Provider:  Valen Gillison MD  CC:  Sharp/U.  History of Present Illness: [name] here for Sharp/u of  Has grandchildren that are graduating from [employer] today.  Ceremony at 5pm today.    HYPERTENSION Disease Monitoring Blood pressure range: 120s-140s Medications: lasix 37m hctz 25, toprol xl 50, spironolactone 25 Compliance: yes Prevention Exercise: trying, pt is restricted by knee Salt restriction:trying   Lightheadedness:no    Edema:  no  Chest pain:no   Dyspnea:no Weight: +5 lbs since last visit  HYPERLIPIDEMIA: Med: Lipitor 40mg  Compliance: yes Prob taking med: no Muscle aches: no Abd  pain:no Weight: +5 lbs since last visit  OBESITY BMI:42.Sharon   Weight difference since last OV:+5lbs Exercise:  no       How often:       Diet: trying,     Habits & Providers  Alcohol-Tobacco-Diet     Tobacco Status: never  Current Medications (verified): 1)  Coumadin 5 Mg Tabs (Warfarin Sodium) .... Take As Directed 2)  Ferrous Sulfate 325 (65 Fe) Mg Tabs (Ferrous Sulfate) .... Take 1 Tablet By Mouth Twice A Day 3)  Hydrochlorothiazide 25 Mg Tabs (Hydrochlorothiazide) .... Take 1 Tablet By Mouth Once A Day 4)  Lasix 20 Mg Tabs (Furosemide) .... Take 1 Tablet Once A Day 5)  Lipitor 40 Mg Tabs (Atorvastatin Calcium) .... Take 1 Tab By Mouth At Bedtime 6)  Spironolactone 25 Mg Tabs (Spironolactone) .... Take 1 Tablet By Mouth Once A Day 7)  Verapamil Hcl Cr 240 Mg Tbcr (Verapamil Hcl) .... Take 1 Tablet By Mouth At Bedtime 8)  Toprol Xl 50 Mg Tb24 (Metoprolol Succinate) .... One  Tablet By Mouth Once Daily  Allergies (verified): No Known Drug Allergies  Physical Exam  General:  Well-developed,well-nourished,in no acute distress; alert,appropriate and cooperative throughout examination. vitals reviewed Head:  normocephalic and atraumatic.   Lungs:  Normal respiratory effort, chest expands symmetrically. Lungs are clear to auscultation, no crackles or wheezes. Heart:  Normal rate and regular rhythm. S1 and S2 normal without gallop, murmur, click, rub or other extra sounds. Abdomen:  Bowel sounds positive,abdomen soft and non-tender without masses, organomegaly or hernias noted. obese Pulses:  R and L radiall,dorsalis pedis and  are full and equal bilaterally Extremities:  No clubbing, cyanosis, edema, or deformity noted with normal full range of motion of all joints.   Neurologic:  alert & oriented X3.   Cervical Nodes:  No lymphadenopathy noted Psych:  Oriented X3, memory intact for recent and remote, normally interactive, and good eye contact.     Impression & Recommendations:  Problem # 1:  HYPERTENSION, BENIGN SYSTEMIC (ICD-401.1) Assessment Unchanged BP at goal.  will cont current meds.  Her updated medication list for this problem includes:    Hydrochlorothiazide 25 Mg Tabs (Hydrochlorothiazide) .Marland Kitchen... Take 1 tablet by mouth once a day    Lasix 20 Mg Tabs (Furosemide) .Marland KitchenMarland KitchenMarland KitchenMarland Kitchen  Take 1 tablet once a day    Spironolactone 25 Mg Tabs (Spironolactone) .Marland Kitchen... Take 1 tablet by mouth once a day    Verapamil Hcl Cr 240 Mg Tbcr (Verapamil hcl) .Marland Kitchen... Take 1 tablet by mouth at bedtime    Toprol Xl 50 Mg Tb24 (Metoprolol succinate) ..... One tablet by mouth once daily  Orders: [facility]- Est  Level 4 ([address])  Problem # 2:  HYPERLIPIDEMIA (ICD-272.4) Assessment: Unchanged Will cont current med.  LDL 89, at goal.   Her updated medication list for this problem includes:    Lipitor 40 Mg Tabs (Atorvastatin calcium) .Marland Kitchen... Take 1 tab by mouth at bedtime  Orders: [facility]- Est   Level 4 (99214)  Problem # 3:  OBESITY, NOS (ICD-278.00) BMI 42.Sharon vs previous41.9.  Encouraged pt to monitor portion sizes and add vegetables to her diet.  Orders: [facility]- Est  Level 4 (60454)  Problem # 4:  PULMONARY EMBOLISM (ICD-415.19) Will check INR. Her updated medication list for this problem includes:    Coumadin 5 Mg Tabs (Warfarin sodium) .Marland Kitchen... Take as directed  Complete Medication List: 1)  Coumadin 5 Mg Tabs (Warfarin sodium) .... Take as directed 2)  Ferrous Sulfate 325 (65 Fe) Mg Tabs (Ferrous sulfate) .... Take 1 tablet by mouth twice a day 3)  Hydrochlorothiazide 25 Mg Tabs (Hydrochlorothiazide) .... Take 1 tablet by mouth once a day 4)  Lasix 20 Mg Tabs (Furosemide) .... Take 1 tablet once a day 5)  Lipitor 40 Mg Tabs (Atorvastatin calcium) .... Take 1 tab by mouth at bedtime 6)  Spironolactone 25 Mg Tabs (Spironolactone) .... Take 1 tablet by mouth once a day 7)  Verapamil Hcl Cr 240 Mg Tbcr (Verapamil hcl) .... Take 1 tablet by mouth at bedtime 8)  Toprol Xl 50 Mg Tb24 (Metoprolol succinate) .... One tablet by mouth once daily  Other Orders: INR/PT-[facility] (09811)  Patient Instructions: 1)  Please schedule a follow-up appointment in 3 months for blood pressure .  2)  We will check labs at your next appoitnment. 3)  Continue all your medications as directed.   4)  I've given you a copy of your prescriptions today.   Prescriptions: TOPROL XL 50 MG TB24 (METOPROLOL SUCCINATE) one tablet by mouth once daily  #34 x 5   Entered and Authorized by:   Angeline Slim MD   Signed by:   Angeline Slim MD on 01/15/2010   Method used:   Faxed to ...       [facility] (retail)       [address]       [place], [place]  [address]       Ph: [phone]       Fax: [phone]   RxID:   [id number] VERAPAMIL HCL CR 240 MG TBCR (VERAPAMIL HCL) Take 1 tablet by mouth at bedtime  #34 x 5   Entered and Authorized by:   Angeline Slim MD   Signed by:   Angeline Slim MD on  01/15/2010   Method used:   Faxed to ...       [facility] (retail)       [address]       [place], [place]  [address]       Ph: [phone]       Fax: [phone]   RxID:   [id number] SPIRONOLACTONE 25 MG TABS (  SPIRONOLACTONE) Take 1 tablet by mouth once a day  #34 x 5   Entered and Authorized by:   Angeline Slim MD   Signed by:   Angeline Slim MD on 01/15/2010   Method used:   Faxed to ...       [facility] (retail)       [address]       [place], [place]  [address]       Ph: [phone]       Fax: [phone]   RxID:   [id number] LIPITOR 40 MG TABS (ATORVASTATIN CALCIUM) Take 1 tab by mouth at bedtime  #34 x 5   Entered and Authorized by:   Angeline Slim MD   Signed by:   Angeline Slim MD on 01/15/2010   Method used:   Faxed to ...       [facility] (retail)       [address]       [place], [place]  [address]       Ph: [phone]       Fax: [phone]   RxID:   [id number] LASIX 20 MG TABS (FUROSEMIDE) Take 1 tablet once a day  #34 x 5   Entered and Authorized by:   Angeline Slim MD   Signed by:   Angeline Slim MD on 01/15/2010   Method used:   Faxed to ...       [facility] (retail)       [address]       [place], [place]  [address]       Ph: [phone]       Fax: [phone]   RxID:   [id number] HYDROCHLOROTHIAZIDE 25 MG TABS (HYDROCHLOROTHIAZIDE) Take 1 tablet by mouth once a day  #34 x 5   Entered and Authorized by:   Angeline Slim MD   Signed by:   Angeline Slim MD on 01/15/2010   Method used:   Faxed to ...       [facility] (retail)       [address]       [place], [place]  [address]       Ph: [phone]       Fax: [phone]   RxID:   [id number] FERROUS SULFATE 325 (65 FE) MG TABS (FERROUS SULFATE) Take 1 tablet by mouth twice a day  #64 x 5   Entered and Authorized by:   Angeline Slim MD   Signed by:   Angeline Slim MD on  01/15/2010   Method used:   Faxed to ...       [facility] (retail)       [address]       [place]       [place], [place]  [address]       Ph: [phone]       Fax: [phone]   RxID:   [id number] COUMADIN 5 MG TABS (WARFARIN SODIUM) Take as directed  #60 x 5   Entered and Authorized by:   Angeline Slim MD   Signed by:   Angeline Slim MD on 01/15/2010   Method used:   Faxed to ...       [facility] (retail)  [address]       [place], [place]  [address]       Ph: [phone]       Fax: [phone]   RxID:   [id number] TOPROL XL 50 MG TB24 (METOPROLOL SUCCINATE) one tablet by mouth once daily  #34 x 5   Entered and Authorized by:   Angeline Slim MD   Signed by:   Angeline Slim MD on 01/15/2010   Method used:   Print then Give to Patient   RxID:   [id number] VERAPAMIL HCL CR 240 MG TBCR (VERAPAMIL HCL) Take 1 tablet by mouth at bedtime  #34 x 5   Entered and Authorized by:   Angeline Slim MD   Signed by:   Angeline Slim MD on 01/15/2010   Method used:   Print then Give to Patient   RxID:   [id number] SPIRONOLACTONE 25 MG TABS (SPIRONOLACTONE) Take 1 tablet by mouth once a day  #34 x 5   Entered and Authorized by:   Angeline Slim MD   Signed by:   Angeline Slim MD on 01/15/2010   Method used:   Print then Give to Patient   RxID:   [id number] LIPITOR 40 MG TABS (ATORVASTATIN CALCIUM) Take 1 tab by mouth at bedtime  #34 x 5   Entered and Authorized by:   Angeline Slim MD   Signed by:   Angeline Slim MD on 01/15/2010   Method used:   Print then Give to Patient   RxID:   [id number] LASIX 20 MG TABS (FUROSEMIDE) Take 1 tablet once a day  #34 x 5   Entered and Authorized by:   Angeline Slim MD   Signed by:   Angeline Slim MD on 01/15/2010   Method used:   Print then Give to Patient   RxID:   [id number] HYDROCHLOROTHIAZIDE 25 MG TABS (HYDROCHLOROTHIAZIDE) Take 1 tablet by mouth once a day  #34 x 5   Entered and Authorized by:   Angeline Slim MD   Signed by:   Angeline Slim MD  on 01/15/2010   Method used:   Print then Give to Patient   RxID:   [id number] FERROUS SULFATE 325 (65 FE) MG TABS (FERROUS SULFATE) Take 1 tablet by mouth twice a day  #64 x 5   Entered and Authorized by:   Angeline Slim MD   Signed by:   Angeline Slim MD on 01/15/2010   Method used:   Print then Give to Patient   RxID:   [id number] COUMADIN 5 MG TABS (WARFARIN SODIUM) Take as directed  #60 x 3   Entered and Authorized by:   Angeline Slim MD   Signed by:   Angeline Slim MD on 01/15/2010   Method used:   Print then Give to Patient   RxID:   [id number]    Prevention & Chronic Care Immunizations   Influenza vaccine: Historical  (08/24/2009)   Influenza vaccine due: 05/06/2009    Tetanus booster: 08/06/1999: Done.   Tetanus booster due: 08/05/2009    Pneumococcal vaccine: Historical  (07/05/2009)    H. zoster vaccine: Not documented  Colorectal Screening   Hemoccult: Done.  (01/04/2004)   Hemoccult due: Not Indicated    Colonoscopy: normal  (09/12/2006)   Colonoscopy due: 09/12/2016  Other Screening   Pap smear: normal  (05/06/2008)   Pap smear action/deferral: Deferred-3 yr interval  (08/24/2009)   Pap smear due: 05/07/2011  Mammogram: ASSESSMENT: Negative - BI-RADS 1^MM DIGITAL SCREENING  (09/15/2009)   Mammogram action/deferral: Ordered  (08/24/2009)   Mammogram due: 05/13/2009    DXA bone density scan: Not documented   DXA bone density action/deferral: Ordered  (08/24/2009)   Smoking status: never  (01/15/2010)  Lipids   Total Cholesterol: 149  (09/07/2009)   LDL: 89  (09/07/2009)   LDL Direct: 99  (06/23/2008)   HDL: 47  (09/07/2009)   Triglycerides: 63  (09/07/2009)    SGOT (AST): 19  (09/07/2009)   SGPT (ALT): 13  (09/07/2009)   Alkaline phosphatase: 106  (09/07/2009)   Total bilirubin: 0.4  (09/07/2009)    Lipid flowsheet reviewed?: Yes   Progress toward LDL goal: At goal  Hypertension   Last Blood Pressure: 135 / 80  (01/15/2010)   Serum creatinine:  1.37  (09/07/2009)   Serum potassium 3.8  (09/07/2009)    Hypertension flowsheet reviewed?: Yes   Progress toward BP goal: At goal  Self-Management Support :   Personal Goals (by the next clinic visit) :      Personal blood pressure goal: 130/80  (05/05/2009)     Personal LDL goal: 100  (05/05/2009)    Hypertension self-management support: Written self-care plan  (01/15/2010)   Hypertension self-care plan printed.    Lipid self-management support: Not documented    Laboratory Results   Blood Tests      INR: 2.7   (Normal Range: 0.88-1.12   Therap INR: 2.0-3.5)      ANTICOAGULATION RECORD PREVIOUS REGIMEN & LAB RESULTS Anticoagulation Diagnosis:  PULMONARY EMBOLI on  03/02/2007 Previous INR Goal Range:  2 - 3.5 on  10/27/2009 Previous INR:  2.3 on  11/23/2009 Previous Coumadin Dose(mg):  5MG  TABLETS on  03/02/2007 Previous Regimen:  continue:  5 mg - Tues, Thurs, Sat;  7.5 mg - other days on  11/23/2009 Previous Coagulation Comments:  done by [facility] 02-22-09 on  02/23/2009  NEW REGIMEN & LAB RESULTS Current INR: 2.7 Regimen: continue 5mg  M,W,[facility]; 7.5mg  other days  Provider: Dr. Janalyn Harder Repeat testing in: 4 weeks Other Comments: ...........test performed by...........Marland KitchenTerese Door, CMA   Dose has been reviewed with patient or caretaker during this visit. Reviewed by: D. Kathrine Cords, CMA  Anticoagulation Visit Questionnaire Coumadin dose missed/changed:  No Abnormal Bleeding Symptoms:  No  Any diet changes including alcohol intake, vegetables or greens since the last visit:  No Any illnesses or hospitalizations since the last visit:  No Any signs of clotting since the last visit (including chest discomfort, dizziness, shortness of breath, arm tingling, slurred speech, swelling or redness in leg):  No  MEDICATIONS COUMADIN 5 MG TABS (WARFARIN SODIUM) Take as directed FERROUS SULFATE 325 (65 FE) MG TABS (FERROUS SULFATE) Take 1 tablet by mouth twice a day HYDROCHLOROTHIAZIDE  25 MG TABS (HYDROCHLOROTHIAZIDE) Take 1 tablet by mouth once a day LASIX 20 MG TABS (FUROSEMIDE) Take 1 tablet once a day LIPITOR 40 MG TABS (ATORVASTATIN CALCIUM) Take 1 tab by mouth at bedtime SPIRONOLACTONE 25 MG TABS (SPIRONOLACTONE) Take 1 tablet by mouth once a day VERAPAMIL HCL CR 240 MG TBCR (VERAPAMIL HCL) Take 1 tablet by mouth at bedtime TOPROL XL 50 MG TB24 (METOPROLOL SUCCINATE) one tablet by mouth once daily

## 2010-09-04 NOTE — Miscellaneous (Signed)
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) Removed problem of SCREENING FOR MALIGNANT NEOPLASM OF THE CERVIX (ICD-V76.2) Removed problem of ENCOUNTER FOR LONG-TERM USE OF ANTICOAGULANTS (ICD-V58.61) Removed problem of PERSONAL HISTORY OF THROMBOPHLEBITIS (ICD-V12.52) Removed problem of VENOUS STASIS ULCER (ICD-454.0) Removed problem of VITAMIN D DEFICIENCY (ICD-268.9) Observations: Added new observation of PAST MED HX: Chronic Kidney Disaese Stage III Degenerative  joint disease , R hip,  Recurrent  DVT/pulmonary  emboli (4 times)--hypercoagulation workup negative    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  HTN Hyperlipidemia Obesity History of Venous status ulcer History of thromboblebitis Vit D def

## 2010-09-04 NOTE — Assessment & Plan Note (Signed)
Summary: Pulmonary Consultation    Primary Provider/Referring Provider:  CAT TA MD  CC:  Last seen by PW 3-4 years ago.  Here to follow up.  Needing letter for management office for house that she is renting.  States breathing is doing well overall.  Marland Kitchen  History of Present Illness: Pulmonary OV 65 yo AAF wiht prior hx of Pulmonary embollii in 1997 with recurrence in 2005 when coumadin dosing was adjusted and INR fell.  Pt had IVC filter placed in 2005 with bleeding complications.  Pt did well and has been on coumadin since that time.    Pt now in [facility] clinic and being followed.  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  Pt is limited in ambulation.  Pt desired f/u wiht pulm to obtain a letter to allow her to move to a different apt that does not have stairs. Pt has Govt support on her housing and needs the letter for this move.  Pt has dyspnea on exertion but no chest pain at this time. There is no chest pain or cough.  There have not  been any issues with coumadin dosing or bleeding recently Pt denies any significant sore throat, nasal congestion or excess secretions, fever, chills, sweats, unintended weight loss, pleurtic or exertional chest pain, orthopnea PND, or leg swelling Pt denies any increase in rescue therapy over baseline, denies waking up needing it or having any early am or nocturnal exacerbations of coughing/wheezing/or dyspnea.    Preventive Screening-Counseling & Management  Alcohol-Tobacco     Smoking Status: never  Current Medications (verified): 1)  Coumadin 5 Mg Tabs (Warfarin Sodium) .... Take As Directed 2)  Ferrous Sulfate 325 (65 Fe) Mg Tabs (Ferrous Sulfate) .... Take 1 Tablet By Mouth Twice A Day 3)  Hydrochlorothiazide 25 Mg Tabs (Hydrochlorothiazide) .... Take 1 Tablet By Mouth Once A Day 4)  Lasix 20 Mg Tabs (Furosemide) .... Take 1 Tablet Once A Day 5)   Lipitor 40 Mg Tabs (Atorvastatin Calcium) .... Take 1 Tablet By Mouth Once A Day 6)  Spironolactone 25 Mg Tabs (Spironolactone) .... Take 1 Tablet By Mouth Once A Day 7)  Verapamil Hcl Cr 240 Mg Tbcr (Verapamil Hcl) .... Take 1 Tablet By Mouth At Bedtime 8)  Toprol Xl 50 Mg Tb24 (Metoprolol Succinate) .... One Tablet By Mouth Qday 9)  Vicodin 5-500 Mg Tabs (Hydrocodone-Acetaminophen) .... As Needed  Allergies (verified): No Known Drug Allergies  Past History:  Past medical, surgical, family and social histories (including risk factors) reviewed, and no changes noted (except as noted below).  Past Medical History: Chronic Kidney Disaese baseline 1.4,  Degenerative  joint disease , R hip,  Recurrent  DVT/pulmonary  emboli (4 times)--hypercoagulation workup negative    2005 had retroperitoneal hemorrhage with IVC filter placement after recurrent PE/DVT    Post phlebitic syndrome HTN Hyperlipidemia Obesity  Past Surgical History: Reviewed history from 08/24/2009 and no changes required. Left hip replacement 02/2009, Dr Elita Quick L shoulder surgery 07/15/08, Dr Elita Quick  Femoral vein ligation, R - 12/04/2002,  [place] filter Right Jugular - 11/03/2003,  Total hip arthroplasty, R - 05/06/2003, Planning L hip Sx for 2010 Tubal ligation - 08/06/1979 ECHO- EF 55-65% , RV mod dilated, mildly increased LV wall thickness  Family History: Reviewed history from 05/06/2008 and no changes required. CVA - siblings,  Diabetes 1st degree,  Heart disease - parents (father died at 100 of MI) (  mother also had MI) Lymphoma - brother,  Phlebitis - mother,  Pulmonary embolism -sibling allergies-daughter  Social History: Reviewed history from 11/28/2008 and no changes required. 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 Lives at home with  daughter born in '87. Non smoker. 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  Currently has Lorin Picket (65  y/o) who has mulptiple medical issue and Down's thinking about adopting her.  Has a 7-mo foster daughter, Sterling Big.  Never smoked.   Review of Systems       The patient complains of non-productive cough, coughing up blood, irregular heartbeats, loss of appetite, weight change, tooth/dental problems, headaches, sneezing, hand/feet swelling, and joint stiffness or pain.  The patient denies shortness of breath with activity, shortness of breath at rest, productive cough, chest pain, acid heartburn, indigestion, abdominal pain, difficulty swallowing, sore throat, nasal congestion/difficulty breathing through nose, itching, ear ache, anxiety, depression, rash, change in color of mucus, and fever.    Vital Signs:  Patient profile:   65 year old female Height:      65 inches Weight:      247.38 pounds BMI:     41.31 O2 Sat:      100 % on Room air Temp:     98.5 degrees F oral Pulse rate:   72 / minute BP sitting:   110 / 70  (left arm) Cuff size:   large  Vitals Entered By: Gweneth Dimitri RN (September 06, 2009 9:04 AM)  O2 Flow:  Room air CC: Last seen by PW 3-4 years ago.  Here to follow up.  Needing letter for management office for house that she is renting.  States breathing is doing well overall.   Comments Medications reviewed with patient Daytime contact number verified with patient. Gweneth Dimitri RN  September 06, 2009 9:05 AM    Physical Exam  Additional Exam:  Gen: Pleasant, obese , in no distress,  normal affect ENT: No lesions,  mouth clear,  oropharynx clear, no postnasal drip Neck: No JVD, no TMG, no carotid bruits Lungs: No use of accessory muscles, no dullness to percussion, clear without rales or rhonchi Cardiovascular: RRR, heart sounds normal, no murmur or gallops, no peripheral edema Abdomen: soft and NT, no HSM,  BS normal Musculoskeletal: No deformities, no cyanosis or clubbing,  chronic venous stasis, chronic edema LE,  venous stasis dermatitis LE Neuro: alert, non  focal Skin: Warm, no lesions or rashes    Impression & Recommendations:  Problem # 1:  VARICOSE VEINS LOWER EXTREMITIES W/INFLAMMATION (ICD-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 Patient Level IV (64332)  Problem # 2:  PULMONARY EMBOLISM (ICD-415.19) Assessment: Improved See assessment number one  Her updated medication list for this problem includes:    Coumadin 5 Mg Tabs (Warfarin sodium) .Marland Kitchen... Take as directed  Orders: Est. Patient Level IV (95188)  Medications Added to Medication List This Visit: 1)  Vicodin 5-500 Mg Tabs (Hydrocodone-acetaminophen) .... As needed  Complete Medication List: 1)  Coumadin 5 Mg Tabs (Warfarin sodium) .... Take as directed 2)  Ferrous Sulfate 325 (65 Fe) Mg Tabs (Ferrous sulfate) .... Take 1 tablet by mouth twice a day 3)  Hydrochlorothiazide 25 Mg Tabs (Hydrochlorothiazide) .Marland KitchenMarland KitchenMarland Kitchen  Take 1 tablet by mouth once a day 4)  Lasix 20 Mg Tabs (Furosemide) .... Take 1 tablet once a day 5)  Lipitor 40 Mg Tabs (atorvastatin Calcium)  .... Take 1 tablet by mouth once a day 6)  Spironolactone 25 Mg Tabs (Spironolactone) .... Take 1 tablet by mouth once a day 7)  Verapamil Hcl Cr 240 Mg Tbcr (Verapamil hcl) .... Take 1 tablet by mouth at bedtime 8)  Toprol Xl 50 Mg Tb24 (Metoprolol succinate) .... One tablet by mouth qday 9)  Vicodin 5-500 Mg Tabs (Hydrocodone-acetaminophen) .... As needed  Patient Instructions: 1)  I will write your letter. 2)  You should not be climbing steps. 3)  Return 1 year 4)  No change in medications   Immunization History:  Influenza Immunization History:    Influenza:  historical (08/24/2009)

## 2010-09-04 NOTE — Assessment & Plan Note (Signed)
Summary: fxtoe? per pt/Buckley/Ta   Vital Signs:  Patient profile:   65 year old female Height:      65 inches Weight:      251 pounds BMI:     41.92 BSA:     2.18 Temp:     98.1 degrees F Pulse rate:   102 / minute BP sitting:   144 / 90  Vitals Entered By: Jone Baseman CMA (November 29, 2009 9:18 AM) CC: ? fx toe Is Patient Diabetic? No Pain Assessment Patient in pain? no        Primary Care Provider:  CAT TA MD  CC:  ? fx toe.  History of Present Illness: ? toe fx: was cooking on saturday in kitchen when ran to phone  in living room and caught 4th toe on left foot on leg of steel end table.  since then toe has been swollen, painful to wear tight shoes.  has been using her chronic pain meds with some relief.  hasn't tried anything else.   Habits & Providers  Alcohol-Tobacco-Diet     Tobacco Status: never  Current Medications (verified): 1)  Coumadin 5 Mg Tabs (Warfarin Sodium) .... Take As Directed 2)  Ferrous Sulfate 325 (65 Fe) Mg Tabs (Ferrous Sulfate) .... Take 1 Tablet By Mouth Twice A Day 3)  Hydrochlorothiazide 25 Mg Tabs (Hydrochlorothiazide) .... Take 1 Tablet By Mouth Once A Day 4)  Lasix 20 Mg Tabs (Furosemide) .... Take 1 Tablet Once A Day 5)  Lipitor 40 Mg Tabs (Atorvastatin Calcium) .... Take 1 Tab By Mouth At Bedtime 6)  Spironolactone 25 Mg Tabs (Spironolactone) .... Take 1 Tablet By Mouth Once A Day 7)  Verapamil Hcl Cr 240 Mg Tbcr (Verapamil Hcl) .... Take 1 Tablet By Mouth At Bedtime 8)  Toprol Xl 50 Mg Tb24 (Metoprolol Succinate) .... One Tablet By Mouth Once Daily 9)  Vicodin 5-500 Mg Tabs (Hydrocodone-Acetaminophen) .... As Needed 10)  Vitamin D (Ergocalciferol) 50000 Unit Caps (Ergocalciferol) .Marland Kitchen.. 1 Tab By Mouth Once Per Week For 8 Weeks  Allergies (verified): No Known Drug Allergies  Review of Systems       per HPI.  no brusing noted on toe despite being on coumadin she reports.   Physical Exam  General:  Well-developed,well-nourished,in  no acute distress; alert,appropriate and cooperative throughout examination. vitals reviewed Msk:  4th toe left foot with swelling, unable to fully bend toe.  normal distal cap refill.  nontender to touch.  no brusing noted.    Impression & Recommendations:  Problem # 1:  TOE INJURY (ICD-959.7) Assessment New  unclear if actual break vs sprain vs deep bruise - regardless rx is the same.  discussed buddy taping toes together (done today before she left), wear comfortable shoes.  use her meds already that she has for pain relief.  monitor feeling in toe and if things worsen for any reason let us know.   Orders: [facility]- Est Level  3 (04540)  Complete Medication List: 1)  Coumadin 5 Mg Tabs (Warfarin sodium) .... Take as directed 2)  Ferrous Sulfate 325 (65 Fe) Mg Tabs (Ferrous sulfate) .... Take 1 tablet by mouth twice a day 3)  Hydrochlorothiazide 25 Mg Tabs (Hydrochlorothiazide) .... Take 1 tablet by mouth once a day 4)  Lasix 20 Mg Tabs (Furosemide) .... Take 1 tablet once a day 5)  Lipitor 40 Mg Tabs (Atorvastatin calcium) .... Take 1 tab by mouth at bedtime 6)  Spironolactone 25 Mg Tabs (Spironolactone) .... Take  1 tablet by mouth once a day 7)  Verapamil Hcl Cr 240 Mg Tbcr (Verapamil hcl) .... Take 1 tablet by mouth at bedtime 8)  Toprol Xl 50 Mg Tb24 (Metoprolol succinate) .... One tablet by mouth once daily 9)  Vicodin 5-500 Mg Tabs (Hydrocodone-acetaminophen) .... As needed 10)  Vitamin D (ergocalciferol) 50000 Unit Caps (Ergocalciferol) .Marland Kitchen.. 1 tab by mouth once per week for 8 weeks

## 2010-09-04 NOTE — Letter (Signed)
Summary: Results Follow-up Letter  [facility]  [address]   [place], [place] [address]   Phone: [phone]  Fax: [phone]    09/10/2009  [address] [place], [place]  [address]  Dear Ms. Goetting,   The following are the results of your recent test(s):   Cholesterol LDL(Bad cholesterol):    89      Your goal is less than:    100     HDL (Good cholesterol):  47     Your goal is more than:     39   Vitamin D level:  18  Low  Elecryolytes:  Normal  Kidney Function:  Improved from previous  Hemoglobin:  Normal, Not anemic   Please continue to take all your medications as directed.    I would like you to take Vitamin D supplements.  I will send a prescription to your pharmacy for Vitamin D 50,000 units.  Take one tab per week for 8 consecutive weeks.  After that you can supplement with over-the-counter Vitamin D.    Sincerely,  Jessah Danser MD [facility]           Appended Document: Results Follow-up Letter mailed.

## 2010-09-04 NOTE — Miscellaneous (Signed)
Summary: Medical Report  pt dropped off form to be completed, placed on triage desk for any clinical info to be completed. Knox Royalty  May 21, 2010 1:45 PM    form to Dr. Katha Cabal RN  May 21, 2010 3:52 PM  form done and given to S. Caldwell. Maija Biggers MD  May 21, 2010 9:05 PM  pt notified yesterday & will pick up today.Golden Circle RN  May 22, 2010 9:19 AM

## 2010-09-04 NOTE — Miscellaneous (Signed)
Summary: Orders Update  Clinical Lists Changes  Orders: Added new Test order of Venous Duplex Lower Extremity (Venous Duplex Lower) - Signed 

## 2010-09-04 NOTE — Progress Notes (Signed)
Summary: talk to nurse needs letter  Phone Note Call from Patient Call back at Home Phone [phone]   Caller: Patient Call For: wright Reason for Call: Talk to Nurse Summary of Call: patient seen dr Delford Field 3 or 4 years ago. when she last seen dr Delford Field he told her not to be climbing a lot of stairs. she had to move out of her apartment that was on the second floor 3 years ago. she moved into another apartment that has 5 stairs in the front and 3 in the back. she wants dr Delford Field to write her a letter so the management office to avoid these.  since she hasnt seen dr Daphene Calamity in over 3 years she would be a new patient. does she need another new patient apt or will he write her a letter.  Initial call taken by: Valinda Hoar,  August 18, 2009 12:13 PM  Follow-up for Phone Call        [facility] to advise pt she needs appt since it has been so long in order for PW to write her a letter.Carron Curie CMA  August 18, 2009 2:29 PM   Additional Follow-up for Phone Call Additional follow up Details #1::        spoke with pt.  Pt informed she needs to make an appt for this letter-ov scheduled for 09/06/09 at 9:00 lpt verbalized understanding of appt date/time/location.  Additional Follow-up by: Gweneth Dimitri RN,  August 18, 2009 4:03 PM

## 2010-09-04 NOTE — Miscellaneous (Signed)
Summary: Letter for Home Change  Clinical Lists Changes To whom it may concern:   Ms [name] is a patient of mine with severe pulmonary embolism and chronic venous stasis / post phlebitic syndrome of the lower extremities.  She therefore cannot climb stairs at this time.  Please take this under advisement as she is unable to continue to live in her current home due to the stairs and would need assistance in finding a one level suitable non stair containing home.   Sincerely yours,       Shan Levans, MD [facility]

## 2010-09-04 NOTE — Miscellaneous (Signed)
Summary: Rx for Vit D  Clinical Lists Changes  Problems: Added new problem of VITAMIN D DEFICIENCY (ICD-268.9) Medications: Added new medication of VITAMIN D (ERGOCALCIFEROL) 50000 UNIT CAPS (ERGOCALCIFEROL) 1 tab by mouth once per week for 8 weeks - Signed Rx of VITAMIN D (ERGOCALCIFEROL) 50000 UNIT CAPS (ERGOCALCIFEROL) 1 tab by mouth once per week for 8 weeks;  #8 x 0;  Signed;  Entered by: Angeline Slim MD;  Authorized by: Angeline Slim MD;  Method used: Faxed to [facility], [address] [place], [place], [place]  [address], Ph: [phone], Fax: [phone]    Prescriptions: VITAMIN D (ERGOCALCIFEROL) 50000 UNIT CAPS (ERGOCALCIFEROL) 1 tab by mouth once per week for 8 weeks  #8 x 0   Entered and Authorized by:   Angeline Slim MD   Signed by:   Angeline Slim MD on 09/10/2009   Method used:   Faxed to ...       [facility] (retail)       [address]       [place], [place]  [address]       Ph: [phone]       Fax: [phone]   RxID:   [id number]

## 2010-09-04 NOTE — Assessment & Plan Note (Signed)
Summary: 3 mo f/u,df   Vital Signs:  Patient profile:   65 year old female Height:      63.75 inches Weight:      250 pounds Pulse rate:   89 / minute BP sitting:   133 / 86  (right arm)  Vitals Entered By: Arlyss Repress CMA, (August 24, 2009 3:09 PM) CC: f/up per Dr.Sonyia Muro. refill meds. Is Patient Diabetic? No Pain Assessment Patient in pain? yes     Location: legs Intensity: 4 Onset of pain  Chronic   Primary Care Provider:  Cher Egnor MD  CC:  f/up per Dr.Myles Mallicoat. refill meds..  History of Present Illness: cc: 65 y/o F with   Left hip replacement.  Orthopedics: Dr Seleta Rhymes  PULMONARY EMBOLI:  INR today 1.7.  Saturday and Sunday 1/15 - 1/16 pt had diarrhea and vomiting.  Feeling better now.  She missed coumadin doses on Sat and Sun.    HYPERTENSION Disease Monitoring Blood pressure range: does not check Chest pain: no Dyspnea:no Medications Compliance:yes  Lightheadedness:no  Edema:no Prevention Exercise: trying, difficult since left hip surgery  Salt restriction: yes  HLD: no muscle aches.  no abdominal pain.  Pt trying to lose weight.  Her goal is another 50 lbs.  Obesity: About 3 lbs weight loss since last visit.         Habits & Providers  Alcohol-Tobacco-Diet     Tobacco Status: never  Allergies: No Known Drug Allergies  Past History:  Past Medical History: Chronic Kidney Disaese baseline 1.4,  Degenerative  joint disease , R hip,  Recurrent  DVT/pulmonary  emboli (4 times)--hypercoagulation workup negative HTN Hyperlipidemia Obesity  Past Surgical History: Left hip replacement 02/2009, Dr Elita Quick L shoulder surgery 07/15/08, Dr Elita Quick  Femoral vein ligation, R - 12/04/2002,  [place] filter Right Jugular - 11/03/2003,  Total hip arthroplasty, R - 05/06/2003, Planning L hip Sx for 2010 Tubal ligation - 08/06/1979 ECHO- EF 55-65% , RV mod dilated, mildly increased LV wall thickness  Family History: Reviewed history from 05/06/2008 and no changes  required. CVA - siblings,  Diabetes 1st degree,  Heart disease - parents (father died at 50 of MI) (mother also had MI) Lymphoma - brother,  Phlebitis - mother,  Pulmonary embolism -sibling  Social History: Reviewed history from 11/28/2008 and no changes required. 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 Lives at home with  daughter born in '87. Non smoker. 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  Currently has Lorin Picket (65 y/o) who has mulptiple medical issue and Down's thinking about adopting her.  Has a 7-mo foster daughter, Sterling Big.  Physical Exam  General:  Well-developed,well-nourished,in no acute distress; alert,appropriate and cooperative throughout examination. vitals reviewed Neck:  supple, full ROM, no masses, and no thyromegaly.   Lungs:  Normal respiratory effort, chest expands symmetrically. Lungs are clear to auscultation, no crackles or wheezes. Heart:  Normal rate and regular rhythm. S1 and S2 normal without gallop, murmur, click, rub or other extra sounds. Abdomen:  Bowel sounds positive,abdomen soft and non-tender without masses, organomegaly or hernias noted. obese Pulses:  R radial normal and L radial normal.   Extremities:  No clubbing, cyanosis, edema, or deformity noted with normal full range of motion of all joints.   Neurologic:  alert & oriented X3, cranial nerves II-XII intact, strength normal in all extremities, and sensation intact to light touch.   Skin:  Intact without suspicious lesions or  rashes Cervical Nodes:  No lymphadenopathy noted   Impression & Recommendations:  Problem # 1:  PULMONARY EMBOLISM (ICD-415.19) Pt has not had INR checked since 05/2009.  INR today subtherapeutic at 1.7.  Pt missed dose on Sat and Sun due to gastroenterisits. She is taking 7.5mg  on M, W, Th, Fr, Su and 5mg  on Tu and Sa.  Will continue this dose and recheck INR in 2 wks.   Her updated medication list  for this problem includes:    Coumadin 5 Mg Tabs (Warfarin sodium) .Marland Kitchen... Take as directed  Orders: [facility]- Est  Level 4 ([address])  Problem # 2:  HYPERTENSION, BENIGN SYSTEMIC (ICD-401.1) Assessment: Unchanged BP at goal.  Will continue current meds. Her updated medication list for this problem includes:    Hydrochlorothiazide 25 Mg Tabs (Hydrochlorothiazide) .Marland Kitchen... Take 1 tablet by mouth once a day    Lasix 20 Mg Tabs (Furosemide) .Marland Kitchen... Take 1 tablet once a day    Spironolactone 25 Mg Tabs (Spironolactone) .Marland Kitchen... Take 1 tablet by mouth once a day    Verapamil Hcl Cr 240 Mg Tbcr (Verapamil hcl) .Marland Kitchen... Take 1 tablet by mouth at bedtime    Toprol Xl 50 Mg Tb24 (Metoprolol succinate) ..... One tablet by mouth qday  Orders: [facility]- Est  Level 4 (99214)Future Orders: Comp Met-[facility] ([id number]) ... 08/18/2010  Problem # 3:  HYPERLIPIDEMIA (ICD-272.4) Assessment: Unchanged Will check FLP.  Pt inquired if she will always need to take cholesterol med.  We discussed risk factors of heart disease and need to keep LDL < 100l.   Orders: [facility]- Est  Level 4 ([address]214)Future Orders: Comp Met-[facility] ([id number]) ... 08/18/2010 Lipid-[facility] ([id number]) ... 08/11/2010  Problem # 4:  ANEMIA, OTHER, UNSPECIFIED (ICD-285.9) NO fatigue or sob.  Will check CBC.  Cont Fe. Her updated medication list for this problem includes:    Ferrous Sulfate 325 (65 Fe) Mg Tabs (Ferrous sulfate) .Marland Kitchen... Take 1 tablet by mouth twice a day  Future Orders: CBC-[facility] (84132) ... 08/17/2011  Problem # 5:  OBESITY, NOS (ICD-278.00) Assessment: Improved Lost about 3 lbs since last visit.   Orders: [facility]- Est  Level 4 (44010)  Complete Medication List: 1)  Coumadin 5 Mg Tabs (Warfarin sodium) .... Take as directed 2)  Ferrous Sulfate 325 (65 Fe) Mg Tabs (Ferrous sulfate) .... Take 1 tablet by mouth twice a day 3)  Hydrochlorothiazide 25 Mg Tabs (Hydrochlorothiazide) .... Take 1 tablet by mouth once a day 4)  Lasix 20 Mg Tabs (Furosemide)  .... Take 1 tablet once a day 5)  Lipitor 40 Mg Tabs (atorvastatin Calcium)  .... Take 1 tablet by mouth once a day 6)  Spironolactone 25 Mg Tabs (Spironolactone) .... Take 1 tablet by mouth once a day 7)  Verapamil Hcl Cr 240 Mg Tbcr (Verapamil hcl) .... Take 1 tablet by mouth at bedtime 8)  Toprol Xl 50 Mg Tb24 (Metoprolol succinate) .... One tablet by mouth qday  Other Orders: INR/PT-[facility] (27253) Future Orders: Vit D, 25 [place]-[facility] ([id number]) ... 08/18/2010 Mammogram (Screening) (Mammo) ... 08/25/2010 Dexa scan (Dexa scan) ... 08/18/2010  Patient Instructions: 1)  Please schedule a follow-up appointment in 4 months for hypertension.  2)  Please make appt for cholesterol check.  This has to be fasting, so no food or drink after midnight the night before appointment. 3)  Congratulation on the continued weight loss. 4)  Please schedule appointment for mammogram. 5)  Please schedule appointment for bone density test. Prescriptions: TOPROL XL 50 MG TB24 (METOPROLOL  SUCCINATE) one tablet by mouth qday  #34 x 5   Entered and Authorized by:   Angeline Slim MD   Signed by:   Angeline Slim MD on 08/24/2009   Method used:   Faxed to ...       [facility] (retail)       [address]       [place], [place]  [address]       Ph: [phone]       Fax: [phone]   RxID:   [id number] VERAPAMIL HCL CR 240 MG TBCR (VERAPAMIL HCL) Take 1 tablet by mouth at bedtime  #34 x 5   Entered and Authorized by:   Angeline Slim MD   Signed by:   Angeline Slim MD on 08/24/2009   Method used:   Faxed to ...       [facility] (retail)       [address]       [place], [place]  [address]       Ph: [phone]       Fax: [phone]   RxID:   [id number] SPIRONOLACTONE 25 MG TABS (SPIRONOLACTONE) Take 1 tablet by mouth once a day  #34 x 5   Entered and Authorized by:   Angeline Slim MD   Signed by:   Angeline Slim MD on 08/24/2009   Method used:   Faxed to ...       [facility] (retail)       [address]       [place], [place]  [address]       Ph: [phone]       Fax: [phone]   RxID:   [id number] LIPITOR 40 MG TABS (ATORVASTATIN CALCIUM) Take 1 tablet by mouth once a day  #34 x 5   Entered and Authorized by:   Angeline Slim MD   Signed by:   Angeline Slim MD on 08/24/2009   Method used:   Faxed to ...       [facility] (retail)       [address]       [place], [place]  [address]       Ph: [phone]       Fax: [phone]   RxID:   [id number] LASIX 20 MG TABS (FUROSEMIDE) Take 1 tablet once a day  #34 x 5   Entered and Authorized by:   Angeline Slim MD   Signed by:   Angeline Slim MD on 08/24/2009   Method used:   Faxed to ...       [facility] (retail)       [address]       [place], [place]  [address]       Ph: [phone]       Fax: [phone]   RxID:   [id number] HYDROCHLOROTHIAZIDE 25 MG TABS (HYDROCHLOROTHIAZIDE) Take 1 tablet by mouth once a day  #34 x 5   Entered and Authorized by:   Angeline Slim MD   Signed by:   Angeline Slim MD on 08/24/2009   Method used:   Faxed to ...       [facility] (retail)  [address]       [place], [place]  [address]       Ph: [phone]       Fax: [phone]   RxID:   [id number] FERROUS SULFATE 325 (65 FE) MG TABS (FERROUS SULFATE) Take 1 tablet by mouth twice a day  #64 x 5   Entered and Authorized by:   Angeline Slim MD   Signed by:   Angeline Slim MD on 08/24/2009   Method used:   Faxed to ...       [facility] (retail)       [address]       [place], [place]  [address]       Ph: [phone]       Fax: [phone]   RxID:   [id number] COUMADIN 5 MG TABS (WARFARIN SODIUM) Take as directed  #60 x 3   Entered and Authorized by:   Angeline Slim MD   Signed by:   Angeline Slim MD on 08/24/2009   Method used:   Faxed to ...       [facility] (retail)        [address]       [place], [place]  [address]       Ph: [phone]       Fax: [phone]   RxID:   [id number]    ANTICOAGULATION RECORD PREVIOUS REGIMEN & LAB RESULTS Anticoagulation Diagnosis:  PULMONARY EMBOLI on  03/02/2007 Previous INR Goal Range:  2.5-3.5 on  03/02/2007 Previous INR:  3.3 on  05/05/2009 Previous Coumadin Dose(mg):  5MG  TABLETS on  03/02/2007 Previous Regimen:  continue 7.5mg  M,W,F; 5mg  other days on  05/05/2009 Previous Coagulation Comments:  done by [facility] 02-22-09 on  02/23/2009  NEW REGIMEN & LAB RESULTS Current INR: 1.7 Regimen: continue 7.5mg  M,W,F; 5mg  other days  (no change)  Provider: Dr. Janalyn Harder Reviewed by: Dr. Janalyn Harder  Pt missed doses on Sat and Sun.  She has been taking 7.5mg  M, W, Thr, F, Sun and 5mg  Tues, Sat.  We will continue this regimen and recheck in 2 wks.    Prevention & Chronic Care Immunizations   Influenza vaccine: given  (05/06/2008)   Influenza vaccine due: 05/06/2009    Tetanus booster: 08/06/1999: Done.   Tetanus booster due: 08/05/2009    Pneumococcal vaccine: Not documented    H. zoster vaccine: Not documented  Colorectal Screening   Hemoccult: Done.  (01/04/2004)   Hemoccult due: Not Indicated    Colonoscopy: normal  (09/12/2006)   Colonoscopy due: 09/12/2016  Other Screening   Pap smear: normal  (05/06/2008)   Pap smear action/deferral: Deferred-3 yr interval  (08/24/2009)   Pap smear due: 05/07/2011    Mammogram: ASSESSMENT: Negative - BI-RADS 1^MM DIGITAL SCREENING  (05/13/2008)   Mammogram action/deferral: Ordered  (08/24/2009)   Mammogram due: 05/13/2009    DXA bone density scan: Not documented   DXA bone density action/deferral: Ordered  (08/24/2009)   Smoking status: never  (08/24/2009)  Lipids   Total Cholesterol: 165  (05/05/2009)   LDL: 94  (05/05/2009)   LDL Direct: 99  (06/23/2008)   HDL: 49  (05/05/2009)   Triglycerides: 108  (05/05/2009)    SGOT (AST): 18   (05/05/2009)   SGPT (ALT): 12  (05/05/2009) CMP ordered    Alkaline  phosphatase: 104  (05/05/2009)   Total bilirubin: 0.4  (05/05/2009)    Lipid flowsheet reviewed?: Yes   Progress toward LDL goal: At goal  Hypertension   Last Blood Pressure: 133 / 86  (08/24/2009)   Serum creatinine: 1.65  (05/05/2009)   Serum potassium 3.8  (05/05/2009) CMP ordered     Hypertension flowsheet reviewed?: Yes   Progress toward BP goal: At goal  Self-Management Support :   Personal Goals (by the next clinic visit) :      Personal blood pressure goal: 130/80  (05/05/2009)     Personal LDL goal: 100  (05/05/2009)    Hypertension self-management support: Written self-care plan  (08/24/2009)   Hypertension self-care plan printed.    Lipid self-management support: Not documented    Nursing Instructions: Give Flu vaccine today Schedule screening mammogram (see order) Schedule screening DXA bone density scan (see order)

## 2010-09-04 NOTE — Assessment & Plan Note (Signed)
Summary: Acute NP office visit - ?DVT/PE   Primary Provider/Referring Provider:  CAT TA MD  CC:  swelliing and pain in right calf x2weeks, pt describes as dull ache, and now has knots and pain behind knee into the thigh. pt denies any redness, and or tingling/pain in foot but states she does pain that shoots up toward the tight area..  History of Present Illness: Pulmonary OV 65 yo AAF wiht prior hx of Pulmonary embollii in 1997 with recurrence in 2005 when coumadin dosing was adjusted and INR fell.  Pt had IVC filter placed in 2005 with bleeding complications.  Pt did well and has been on coumadin since that time.    Pt now in [facility] and being followed.  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  Pt is limited in ambulation.  Pt desired f/u wiht pulm to obtain a letter to allow her to move to a different apt that does not have stairs. Pt has Govt support on her housing and needs the letter for this move.  Pt has dyspnea on exertion but no chest pain at this time. There is no chest pain or cough.  There have not  been any issues with coumadin dosing or bleeding recently Pt denies any significant sore throat, nasal congestion or excess secretions, fever, chills, sweats, unintended weight loss, pleurtic or exertional chest pain, orthopnea PND, or leg swelling Pt denies any increase in rescue therapy over baseline, denies waking up needing it or having any early am or nocturnal exacerbations of coughing/wheezing/or dyspnea.  November 16, 2009 --Presents for work in visit. Complains of swelliing and pain in right calf x2weeks, pt describes as dull ache, and now has knots and pain behind knee into the thigh. pt denies any redness, or tingling/pain in foot but states she does pain that shoots up toward the knee. No known injury or recent travel. Last INR was 2.1 few weeks ago. Denies chest pain, dyspnea,  orthopnea, hemoptysis, fever, n/v/d.     Medications Prior to Update: 1)  Coumadin 5 Mg Tabs (Warfarin Sodium) .... Take As Directed 2)  Ferrous Sulfate 325 (65 Fe) Mg Tabs (Ferrous Sulfate) .... Take 1 Tablet By Mouth Twice A Day 3)  Hydrochlorothiazide 25 Mg Tabs (Hydrochlorothiazide) .... Take 1 Tablet By Mouth Once A Day 4)  Lasix 20 Mg Tabs (Furosemide) .... Take 1 Tablet Once A Day 5)  Lipitor 40 Mg Tabs (Atorvastatin Calcium) .... Take 1 Tablet By Mouth Once A Day 6)  Spironolactone 25 Mg Tabs (Spironolactone) .... Take 1 Tablet By Mouth Once A Day 7)  Verapamil Hcl Cr 240 Mg Tbcr (Verapamil Hcl) .... Take 1 Tablet By Mouth At Bedtime 8)  Toprol Xl 50 Mg Tb24 (Metoprolol Succinate) .... One Tablet By Mouth Qday 9)  Vicodin 5-500 Mg Tabs (Hydrocodone-Acetaminophen) .... As Needed 10)  Vitamin D (Ergocalciferol) 50000 Unit Caps (Ergocalciferol) .Marland Kitchen.. 1 Tab By Mouth Once Per Week For 8 Weeks  Current Medications (verified): 1)  Coumadin 5 Mg Tabs (Warfarin Sodium) .... Take As Directed 2)  Ferrous Sulfate 325 (65 Fe) Mg Tabs (Ferrous Sulfate) .... Take 1 Tablet By Mouth Twice A Day 3)  Hydrochlorothiazide 25 Mg Tabs (Hydrochlorothiazide) .... Take 1 Tablet By Mouth Once A Day 4)  Lasix 20 Mg Tabs (Furosemide) .... Take 1 Tablet Once A Day 5)  Lipitor 40 Mg Tabs (Atorvastatin Calcium) .... Take 1 Tab By Mouth  At Bedtime 6)  Spironolactone 25 Mg Tabs (Spironolactone) .... Take 1 Tablet By Mouth Once A Day 7)  Verapamil Hcl Cr 240 Mg Tbcr (Verapamil Hcl) .... Take 1 Tablet By Mouth At Bedtime 8)  Toprol Xl 50 Mg Tb24 (Metoprolol Succinate) .... One Tablet By Mouth Once Daily 9)  Vicodin 5-500 Mg Tabs (Hydrocodone-Acetaminophen) .... As Needed 10)  Vitamin D (Ergocalciferol) 50000 Unit Caps (Ergocalciferol) .Marland Kitchen.. 1 Tab By Mouth Once Per Week For 8 Weeks  Allergies (verified): No Known Drug Allergies  Past History:  Past Medical History: Last updated: 09/06/2009 Chronic Kidney Disaese  baseline 1.4,  Degenerative  joint disease , R hip,  Recurrent  DVT/pulmonary  emboli (4 times)--hypercoagulation workup negative    2005 had retroperitoneal hemorrhage with IVC filter placement after recurrent PE/DVT    Post phlebitic syndrome HTN Hyperlipidemia Obesity  Past Surgical History: Last updated: 08/24/2009 Left hip replacement 02/2009, Dr Elita Quick L shoulder surgery 07/15/08, Dr Elita Quick  Femoral vein ligation, R - 12/04/2002,  [place] filter Right Jugular - 11/03/2003,  Total hip arthroplasty, R - 05/06/2003, Planning L hip Sx for 2010 Tubal ligation - 08/06/1979 ECHO- EF 55-65% , RV mod dilated, mildly increased LV wall thickness  Family History: Last updated: 09/06/2009 CVA - siblings,  Diabetes 1st degree,  Heart disease - parents (father died at 29 of MI) (mother also had MI) Lymphoma - brother,  Phlebitis - mother,  Pulmonary embolism -sibling allergies-daughter  Social History: Last updated: 09/06/2009 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 Lives at home with  daughter born in '87. Non smoker. 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  Currently has Lorin Picket (65 y/o) who has mulptiple medical issue and Down's thinking about adopting her.  Has a 7-mo foster daughter, Sterling Big.  Never smoked.   Risk Factors: Smoking Status: never (09/06/2009)  Review of Systems      See HPI  Vital Signs:  Patient profile:   65 year old female Height:      65 inches Weight:      257.25 pounds BMI:     42.96 O2 Sat:      100 % on Room air Temp:     97.9 degrees F oral Pulse rate:   60 / minute BP sitting:   120 / 78  (left arm) Cuff size:   large  Vitals Entered By: Boone Master CNA (November 16, 2009 12:01 PM)  O2 Flow:  Room air CC: swelliing and pain in right calf x2weeks, pt describes as dull ache, and now has knots and pain behind knee into the thigh. pt denies any redness, or tingling/pain in foot but  states she does pain that shoots up toward the tight area. Is Patient Diabetic? No Comments Medications reviewed with patient Daytime contact number verified with patient. Boone Master CNA  November 16, 2009 12:03 PM    Physical Exam  Additional Exam:  Gen: Pleasant, obese , in no distress,  normal affect ENT: No lesions,  mouth clear,  oropharynx clear, no postnasal drip Neck: No JVD, no TMG, no carotid bruits Lungs: No use of accessory muscles, no dullness to percussion, clear without rales or rhonchi Cardiovascular: RRR, heart sounds normal, no murmur or gallops, no peripheral edema Abdomen: soft and NT, no HSM,  BS normal Musculoskeletal: No deformities, no cyanosis or clubbing,  chronic venous stasis, chronic edema LE,  venous stasis dermatitis LE along right posterior calf notable varicose veins,  tendner to touch.  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 suspect this is phlebitis.  will check INR today, and venous doppler to r/o new DVt.  REC:  We will check your blood today-INR I will call with lab resutls.  We are setting you up for a ultrasound/doppler of your right leg.  Keep leg elevated , warm compresses to back of leg. May use Tylenol or Vicodin as needed pain . Please contact office for sooner follow up if symptoms do not improve or worsen     Orders: Est. Patient Level IV (84696)  Medications Added to Medication List This Visit: 1)  Lipitor 40 Mg Tabs (Atorvastatin calcium) .... Take 1 tab by mouth at bedtime 2)  Toprol Xl 50 Mg Tb24 (Metoprolol succinate) .... One tablet by mouth once daily  Complete Medication List: 1)  Coumadin 5 Mg Tabs (Warfarin sodium) .... Take as directed 2)  Ferrous Sulfate 325 (65 Fe) Mg Tabs (Ferrous sulfate) .... Take 1 tablet by mouth twice a day 3)   Hydrochlorothiazide 25 Mg Tabs (Hydrochlorothiazide) .... Take 1 tablet by mouth once a day 4)  Lasix 20 Mg Tabs (Furosemide) .... Take 1 tablet once a day 5)  Lipitor 40 Mg Tabs (Atorvastatin calcium) .... Take 1 tab by mouth at bedtime 6)  Spironolactone 25 Mg Tabs (Spironolactone) .... Take 1 tablet by mouth once a day 7)  Verapamil Hcl Cr 240 Mg Tbcr (Verapamil hcl) .... Take 1 tablet by mouth at bedtime 8)  Toprol Xl 50 Mg Tb24 (Metoprolol succinate) .... One tablet by mouth once daily 9)  Vicodin 5-500 Mg Tabs (Hydrocodone-acetaminophen) .... As needed 10)  Vitamin D (ergocalciferol) 50000 Unit Caps (Ergocalciferol) .Marland Kitchen.. 1 tab by mouth once per week for 8 weeks  Other Orders: TLB-PT (Protime) ([id number]) Doppler Referral (Doppler)  Patient Instructions: 1)  We will check your blood today-INR I will call with lab resutls.  2)  We are setting you up for a ultrasound/doppler of your right leg.  3)  Keep leg elevated , warm compresses to back of leg. 4)  May use Tylenol or Vicodin as needed pain . 5)  Please contact office for sooner follow up if symptoms do not improve or worsen    Immunization History:  Pneumovax Immunization History:    Pneumovax:  historical (07/05/2009)   Appended Document: chart update-venous doppler neg DVt      Past History:  Past Medical History: Chronic Kidney Disaese baseline 1.4,  Degenerative  joint disease , R hip,  Recurrent  DVT/pulmonary  emboli (4 times)--hypercoagulation workup negative    2005 had retroperitoneal hemorrhage with IVC filter placement after recurrent PE/DVT    Post phlebitic syndrome venous doppler -11/2009 neg for new DVt.  HTN Hyperlipidemia Obesity

## 2010-09-04 NOTE — Progress Notes (Signed)
Summary: triage  Phone Note Call from Patient Call back at Home Phone [phone]   Caller: Patient Summary of Call: Pt thinks she broke her toe.  Not sure what she should do. Initial call taken by: Clydell Hakim,  November 28, 2009 2:43 PM  Follow-up for Phone Call        line remains busy Follow-up by: Golden Circle RN,  November 28, 2009 2:57 PM  Additional Follow-up for Phone Call Additional follow up Details #1::        stubbed toe on steel coffee table over the weekend. swollen, shiny skin, double in size. 4th digit. pain is better except when she wears a shoe. takes narcotic pain meds anyway so has not taken anything else. Additional Follow-up by: Golden Circle RN,  November 28, 2009 3:09 PM

## 2010-09-11 ENCOUNTER — Ambulatory Visit (INDEPENDENT_AMBULATORY_CARE_PROVIDER_SITE_OTHER): Payer: Medicare Other | Admitting: Critical Care Medicine

## 2010-09-11 ENCOUNTER — Encounter: Payer: Self-pay | Admitting: Critical Care Medicine

## 2010-09-11 DIAGNOSIS — I2699 Other pulmonary embolism without acute cor pulmonale: Secondary | ICD-10-CM

## 2010-09-19 ENCOUNTER — Encounter: Payer: Self-pay | Admitting: Family Medicine

## 2010-09-19 DIAGNOSIS — I2699 Other pulmonary embolism without acute cor pulmonale: Secondary | ICD-10-CM

## 2010-09-19 DIAGNOSIS — Z7901 Long term (current) use of anticoagulants: Secondary | ICD-10-CM | POA: Insufficient documentation

## 2010-09-20 NOTE — Assessment & Plan Note (Addendum)
Summary: Pulmonary OV   Primary Provider/Referring Provider:  CAT TA MD  CC:  Yearly Follow up.  Pt states breathing is doing well overall.  Denies SOB, wheezing, chest tightness, chest pain, and cough..  History of Present Illness: Pulmonary OV 65 yo AAF wiht prior hx of Pulmonary embollii in 1997 with recurrence in 2005 when coumadin dosing was adjusted and INR fell.  Pt had IVC filter placed in 2005 with bleeding complications.  Pt did well and has been on coumadin since that time.    Pt now in [facility] clinic and being followed.  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  Pt is limited in ambulation.  Pt desired f/u wiht pulm to obtain a letter to allow her to move to a different apt that does not have stairs. Pt has Govt support on her housing and needs the letter for this move.  Pt has dyspnea on exertion but no chest pain at this time. There is no chest pain or cough.  There have not  been any issues with coumadin dosing or bleeding recently Pt denies any significant sore throat, nasal congestion or excess secretions, fever, chills, sweats, unintended weight loss, pleurtic or exertional chest pain, orthopnea PND, or leg swelling Pt denies any increase in rescue therapy over baseline, denies waking up needing it or having any early am or nocturnal exacerbations of coughing/wheezing/or dyspnea.  November 16, 2009 --Presents for work in visit. Complains of swelliing and pain in right calf x2weeks, pt describes as dull ache, and now has knots and pain behind knee into the thigh. pt denies any redness, or tingling/pain in foot but states she does pain that shoots up toward the knee. No known injury or recent travel. Last INR was 2.1 few weeks ago. Denies chest pain, dyspnea, orthopnea, hemoptysis, fever, n/v/d.   September 11, 2010 10:54 AM No swelling in leg with lasix.  No wheeze or cough.  Still on  coumadin. Has IVC filter.  No new issues. Pt denies any significant sore throat, nasal congestion or excess secretions, fever, chills, sweats, unintended weight loss, pleurtic or exertional chest pain, orthopnea PND, or leg swelling Pt denies any increase in rescue therapy over baseline, denies waking up needing it or having any early am or nocturnal exacerbations of coughing/wheezing/or dyspnea.   Current Medications (verified): 1)  Coumadin 5 Mg Tabs (Warfarin Sodium) .... Take As Directed 2)  Ferrous Sulfate 325 (65 Fe) Mg Tabs (Ferrous Sulfate) .... Take 1 Tablet By Mouth Twice A Day 3)  Hydrochlorothiazide 25 Mg Tabs (Hydrochlorothiazide) .... Take 1 Tablet By Mouth Once A Day 4)  Lasix 20 Mg Tabs (Furosemide) .... Take 1 Tablet Once A Day 5)  Lipitor 40 Mg Tabs (Atorvastatin Calcium) .... Take 1 Tab By Mouth At Bedtime 6)  Spironolactone 25 Mg Tabs (Spironolactone) .... Take 1 Tablet By Mouth Once A Day 7)  Verapamil Hcl Cr 240 Mg Tbcr (Verapamil Hcl) .... Take 1 Tablet By Mouth At Bedtime 8)  Toprol Xl 50 Mg Tb24 (Metoprolol Succinate) .... One Tablet By Mouth Once Daily  Allergies (verified): No Known Drug Allergies  Vital Signs:  Patient profile:   65 year old female Height:      65 inches Weight:      273.25 pounds BMI:     45.64 O2 Sat:      99 % on Room air Temp:  98.0 degrees F oral Pulse rate:   61 / minute BP sitting:   138 / 70  (right arm) Cuff size:   large  Vitals Entered By: Gweneth Dimitri RN (September 11, 2010 10:48 AM)  O2 Flow:  Room air CC: Yearly Follow up.  Pt states breathing is doing well overall.  Denies SOB, wheezing, chest tightness, chest pain, cough. Comments Medications reviewed with patient Daytime contact number verified with patient. Gweneth Dimitri RN  September 11, 2010 10:49 AM    Physical Exam  Additional Exam:  Gen: Pleasant, obese , in no distress,  normal affect ENT: No lesions,  mouth clear,  oropharynx clear, no postnasal  drip Neck: No JVD, no TMG, no carotid bruits Lungs: No use of accessory muscles, no dullness to percussion, clear without rales or rhonchi Cardiovascular: RRR, heart sounds normal, no murmur or gallops, no peripheral edema Abdomen: soft and NT, no HSM,  BS normal Musculoskeletal: No deformities, no cyanosis or clubbing,  chronic venous stasis, chronic edema LE,  venous stasis dermatitis LE along right posterior calf notable varicose veins, tendner to touch.  Neuro: alert, non focal Skin: Warm, no lesions or rashes    Impression & Recommendations:  Problem # 1:  PULMONARY EMBOLISM (ICD-415.19) Assessment Improved chronic thromboembolic disease, plan cont coumadin for life>>>she would be a good candidate for Xalato once available (rivaroxaban) Her updated medication list for this problem includes:    Coumadin 5 Mg Tabs (Warfarin sodium) .Marland Kitchen... Take as directed  Orders: Est. Patient Level III (16109)  Complete Medication List: 1)  Coumadin 5 Mg Tabs (Warfarin sodium) .... Take as directed 2)  Ferrous Sulfate 325 (65 Fe) Mg Tabs (Ferrous sulfate) .... Take 1 tablet by mouth twice a day 3)  Hydrochlorothiazide 25 Mg Tabs (Hydrochlorothiazide) .... Take 1 tablet by mouth once a day 4)  Lasix 20 Mg Tabs (Furosemide) .... Take 1 tablet once a day 5)  Lipitor 40 Mg Tabs (Atorvastatin calcium) .... Take 1 tab by mouth at bedtime 6)  Spironolactone 25 Mg Tabs (Spironolactone) .... Take 1 tablet by mouth once a day 7)  Verapamil Hcl Cr 240 Mg Tbcr (Verapamil hcl) .... Take 1 tablet by mouth at bedtime 8)  Toprol Xl 50 Mg Tb24 (Metoprolol succinate) .... One tablet by mouth once daily  Patient Instructions: 1)  New medicine coming out in 4-6 months called Xalato.(Rivaroxaban) 2)  Watch your weight and diet 3)  No change in medications  4)  Return 8 months

## 2010-10-08 ENCOUNTER — Ambulatory Visit (HOSPITAL_COMMUNITY)
Admission: RE | Admit: 2010-10-08 | Discharge: 2010-10-08 | Disposition: A | Discharge status: Home / Self Care | Payer: Medicare Other | Source: Ambulatory Visit | Attending: Sports Medicine | Admitting: Sports Medicine

## 2010-10-08 ENCOUNTER — Ambulatory Visit (INDEPENDENT_AMBULATORY_CARE_PROVIDER_SITE_OTHER): Payer: Medicare Other | Admitting: Sports Medicine

## 2010-10-08 ENCOUNTER — Ambulatory Visit (INDEPENDENT_AMBULATORY_CARE_PROVIDER_SITE_OTHER): Payer: Medicare Other | Admitting: *Deleted

## 2010-10-08 ENCOUNTER — Encounter: Payer: Self-pay | Admitting: Sports Medicine

## 2010-10-08 DIAGNOSIS — M7062 Trochanteric bursitis, left hip: Secondary | ICD-10-CM | POA: Insufficient documentation

## 2010-10-08 DIAGNOSIS — Z7901 Long term (current) use of anticoagulants: Secondary | ICD-10-CM

## 2010-10-08 DIAGNOSIS — M76899 Other specified enthesopathies of unspecified lower limb, excluding foot: Secondary | ICD-10-CM

## 2010-10-08 DIAGNOSIS — M79609 Pain in unspecified limb: Secondary | ICD-10-CM | POA: Insufficient documentation

## 2010-10-08 DIAGNOSIS — I2699 Other pulmonary embolism without acute cor pulmonale: Secondary | ICD-10-CM

## 2010-10-08 LAB — PROTIME-INR: INR: 2.3 — AB (ref 0.9–1.1)

## 2010-10-08 NOTE — Assessment & Plan Note (Addendum)
Injection performed with immediate and complete relief of symptoms. Handout given.

## 2010-10-08 NOTE — Assessment & Plan Note (Signed)
Hx multiple PE's. Has not had INR checked for 8 months. Last check was 2.7. Checking today. LE dopplers today, if DVT present and therapeutic on anticoag this would mean failure of anticoag and she would benefit from a higher goal range. RTC 1-2 weeks.

## 2010-10-08 NOTE — Patient Instructions (Addendum)
Great to see you. Injected your L trochanteric bursa. Checking INR. Get your ultrasound today to make sure you don't have a DVT, I will let you know the results. Come back to see me in 1-2 weeks to go over results. -Dr. Karie Schwalbe.   Trochanteric Bursitis You have hip pain due to trochanteric bursitis. Bursitis means that the sack near the outside of the hip is filled with fluid and inflamed. This sack is made up of protective soft tissue. The pain from trochanteric bursitis can be severe and keep you from sleep. It can radiate to the buttocks or down the outside of the thigh to the knee. The pain is almost always worse when rising from the seated or lying position and with walking. Pain can improve after you take a few steps. It happens more often in people with hip joint and lumbar spine problems, such as arthritis or previous surgery. Very rarely the trochanteric bursa can become infected, and antibiotics and/or surgery may be needed. Treatment often includes an injection of local anesthetic mixed with cortisone medicine. This medicine is injected into the area where it is most tender over the hip. Repeat injections may be necessary if the response to treatment is slow. You can apply ice packs over the tender area for 30 minutes every 2 hours for the next few days. Anti-inflammatory and/or narcotic pain medicine may also be helpful. Limit your activity for the next few days if the pain continues. See your caregiver in 5-10 days if you are not greatly improved.   STRETCHING EXERCISES - Trochantic Bursitis  These exercises may help you when beginning to rehabilitate your injury. Your symptoms may resolve with or without further involvement from your physician, physical therapist or athletic trainer. While completing these exercises, remember:    Restoring tissue flexibility helps normal motion to return to the joints. This allows healthier, less painful movement and activity.   An effective stretch should be  held for at least 30 seconds.   A stretch should never be painful. You should only feel a gentle lengthening or release in the stretched tissue.  STRETCH - Iliotibial Band   On the floor or bed, lie on your side so your injured leg is on top. Bend your knee and grab your ankle.   Slowly bring your knee back so that your thigh is in line with your trunk. Keep your heel at your buttocks and gently arch your back so your head, shoulders and hips line up.   Slowly lower your leg so that your knee approaches the floor/bed until you feel a gentle stretch on the outside of your thigh. If you do not feel a stretch and your knee will not fall farther, place the heel of your opposite foot on top of your knee and pull your thigh down farther.   Hold this stretch for 15 seconds.   Repeat 3 times. Complete this exercise 1 times per day.  STRETCH - Hamstrings, Supine   Lie on your back. Loop a belt or towel over the ball of your foot as shown.   Straighten your knee and slowly pull on the belt to raise your injured leg. Do not allow the knee to bend. Keep your opposite leg flat on the floor.   Raise the leg until you feel a gentle stretch behind your knee or thigh. Hold this position for 15 seconds.   Repeat 3 times. Complete this stretch 1 times per day.  STRETCH - Quadriceps, Prone  Lie on your stomach on a firm surface, such as a bed or padded floor.   Bend your knee and grasp your ankle. If you are unable to reach, your ankle or pant leg, use a belt around your foot to lengthen your reach.   Gently pull your heel toward your buttocks. Your knee should not slide out to the side. You should feel a stretch in the front of your thigh and/or knee.   Hold this position for 15 seconds.   Repeat 3 times. Complete this stretch 1 times per day.  STRETCHING - Hip Flexors, Lunge Half kneel with your knee on the floor and your opposite knee bent and directly over your ankle.  Keep good posture with  your head over your shoulders. Tighten your buttocks to point your tailbone downward; this will prevent your back from arching too much.   You should feel a gentle stretch in the front of your thigh and/or hip. If you do not feel any resistance, slightly slide your opposite foot forward and then slowly lunge forward so your knee once again lines up over your ankle. Be sure your tailbone remains pointed downward.   Hold this stretch for 15 seconds.   Repeat 3 times. Complete this stretch 1 times per day.  STRETCH - Adductors, Lunge  While standing, spread your legs   Lean away from your injured leg by bending your opposite knee. You may rest your hands on your thigh for balance.   You should feel a stretch in your inner thigh. Hold for 15 seconds.   Repeat 3 times. Complete this exercise 1 times per day.  SEEK IMMEDIATE MEDICAL CARE IF YOU DEVELOP:  Severe pain, fever, increased redness.   Pain that radiates below the knee.  Document Released: 08/29/2004 Document Re-Released: 05/19/2009 [facility] Patient Information 2011 [place], [place].Trochanteric Bursitis You have hip pain due to trochanteric bursitis. Bursitis means that the sack near the outside of the hip is filled with fluid and inflamed. This sack is made up of protective soft tissue. The pain from trochanteric bursitis can be severe and keep you from sleep. It can radiate to the buttocks or down the outside of the thigh to the knee. The pain is almost always worse when rising from the seated or lying position and with walking. Pain can improve after you take a few steps. It happens more often in people with hip joint and lumbar spine problems, such as arthritis or previous surgery. Very rarely the trochanteric bursa can become infected, and antibiotics and/or surgery may be needed. Treatment often includes an injection of local anesthetic mixed with cortisone medicine. This medicine is injected into the area where it is most tender  over the hip. Repeat injections may be necessary if the response to treatment is slow. You can apply ice packs over the tender area for 30 minutes every 2 hours for the next few days. Anti-inflammatory and/or narcotic pain medicine may also be helpful. Limit your activity for the next few days if the pain continues. See your caregiver in 5-10 days if you are not greatly improved.   STRETCHING EXERCISES - Trochantic Bursitis  These exercises may help you when beginning to rehabilitate your injury. Your symptoms may resolve with or without further involvement from your physician, physical therapist or athletic trainer. While completing these exercises, remember:    Restoring tissue flexibility helps normal motion to return to the joints. This allows healthier, less painful movement and activity.   An effective stretch  should be held for at least 30 seconds.   A stretch should never be painful. You should only feel a gentle lengthening or release in the stretched tissue.  STRETCH - Iliotibial Band   On the floor or bed, lie on your side so your injured leg is on top. Bend your knee and grab your ankle.   Slowly bring your knee back so that your thigh is in line with your trunk. Keep your heel at your buttocks and gently arch your back so your head, shoulders and hips line up.   Slowly lower your leg so that your knee approaches the floor/bed until you feel a gentle stretch on the outside of your thigh. If you do not feel a stretch and your knee will not fall farther, place the heel of your opposite foot on top of your knee and pull your thigh down farther.   Hold this stretch for 15 seconds.   Repeat 3 times. Complete this exercise 1 times per day.  STRETCH - Hamstrings, Supine   Lie on your back. Loop a belt or towel over the ball of your foot as shown.   Straighten your knee and slowly pull on the belt to raise your injured leg. Do not allow the knee to bend. Keep your opposite leg flat on the  floor.   Raise the leg until you feel a gentle stretch behind your knee or thigh. Hold this position for 15 seconds.   Repeat 3 times. Complete this stretch 1 times per day.  STRETCH - Quadriceps, Prone   Lie on your stomach on a firm surface, such as a bed or padded floor.   Bend your knee and grasp your ankle. If you are unable to reach, your ankle or pant leg, use a belt around your foot to lengthen your reach.   Gently pull your heel toward your buttocks. Your knee should not slide out to the side. You should feel a stretch in the front of your thigh and/or knee.   Hold this position for 15 seconds.   Repeat 3 times. Complete this stretch 1 times per day.  STRETCHING - Hip Flexors, Lunge Half kneel with your knee on the floor and your opposite knee bent and directly over your ankle.  Keep good posture with your head over your shoulders. Tighten your buttocks to point your tailbone downward; this will prevent your back from arching too much.   You should feel a gentle stretch in the front of your thigh and/or hip. If you do not feel any resistance, slightly slide your opposite foot forward and then slowly lunge forward so your knee once again lines up over your ankle. Be sure your tailbone remains pointed downward.   Hold this stretch for 15 seconds.   Repeat 3 times. Complete this stretch 1 times per day.  STRETCH - Adductors, Lunge  While standing, spread your legs   Lean away from your injured leg by bending your opposite knee. You may rest your hands on your thigh for balance.   You should feel a stretch in your inner thigh. Hold for 15 seconds.   Repeat 3 times. Complete this exercise 1 times per day.  SEEK IMMEDIATE MEDICAL CARE IF YOU DEVELOP:  Severe pain, fever, increased redness.   Pain that radiates below the knee.  Document Released: 08/29/2004 Document Re-Released: 05/19/2009 [facility] Patient Information 2011 [place], [place].

## 2010-10-08 NOTE — Progress Notes (Signed)
  Subjective:    Patient ID: [name], female    DOB: 06-21-1946, 65 y.o.   MRN: [id number]  HPI Several week hx L leg pain with some swelling.  Pain is mostly over L trochanteric bursa.  Worse with essentially any activity.  Better with rest.  Has tried oxycodone and other analgesics and these never worked.  She desires to not use prescription oral meds for this.  Pain is sharp.  She does not exercise.  She does have a Hx multiple PE's, IVC filter, on coumadin but has not had INR checked in 8 months.  Last check was 2.7.  No CP, no SOB.  Review of Systems    See HPI Objective:   Physical Exam  Constitutional: She appears well-developed and well-nourished. No distress.       Morbidly obese  Cardiovascular: Normal rate, regular rhythm and normal heart sounds.  Exam reveals no gallop and no friction rub.   No murmur heard. Pulmonary/Chest: Effort normal and breath sounds normal. No respiratory distress. She has no wheezes. She has no rales. She exhibits no tenderness.  L Hip: ROM IR: 45 Deg, ER: 45 Deg, Flexion: 90 Deg, Extension: 70 Deg, Abduction: 20 Deg, Adduction: 20 Deg Strength IR: 4/5, ER: 4/5, Flexion: 4/5, Extension: 4/5, Abduction: 4/5, Adduction: 4/5 Pelvic alignment unremarkable to inspection and palpation. Standing hip rotation and gait without trendelenburg sign / unsteadiness. Greater trochanter WITH SIGNIFICANT tenderness to palpation. Minimal tenderness over piriformis. No pain with FABER or FADIR. No SI joint tenderness and normal minimal SI movement.  Negative lying straight leg raise.  Consent obtained and verified. Sterile betadine prep. Furthur cleansed with alcohol. Topical analgesic spray: Ethyl chloride. Joint: L Trochanteric bursa Approached in typical fashion with: spinal needle Completed without difficulty Meds: 4cc lidocaine 1%, 1cc kenalog 40. Aftercare instructions and Red flags advised. All pain resolved immediately after injection       Assessment & Plan:

## 2010-10-15 ENCOUNTER — Ambulatory Visit (INDEPENDENT_AMBULATORY_CARE_PROVIDER_SITE_OTHER): Payer: Medicare Other | Admitting: Family Medicine

## 2010-10-15 ENCOUNTER — Encounter: Payer: Self-pay | Admitting: Family Medicine

## 2010-10-15 ENCOUNTER — Telehealth: Payer: Self-pay | Admitting: Family Medicine

## 2010-10-15 VITALS — BP 130/80 | HR 64 | Wt 270.0 lb

## 2010-10-15 DIAGNOSIS — I10 Essential (primary) hypertension: Secondary | ICD-10-CM

## 2010-10-15 LAB — CBC
HCT: 42 % (ref 36.0–46.0)
Hemoglobin: 13.1 g/dL (ref 12.0–15.0)
MCH: 28.7 pg (ref 26.0–34.0)
MCHC: 31.2 g/dL (ref 30.0–36.0)
MCV: 91.9 fL (ref 78.0–100.0)
Platelets: 350 10*3/uL (ref 150–400)
RBC: 4.57 MIL/uL (ref 3.87–5.11)
RDW: 15.6 % — ABNORMAL HIGH (ref 11.5–15.5)
WBC: 10.6 10*3/uL — ABNORMAL HIGH (ref 4.0–10.5)

## 2010-10-15 LAB — BASIC METABOLIC PANEL
BUN: 29 mg/dL — ABNORMAL HIGH (ref 6–23)
CO2: 27 mEq/L (ref 19–32)
Calcium: 9.5 mg/dL (ref 8.4–10.5)
Chloride: 103 mEq/L (ref 96–112)
Creat: 1.31 mg/dL — ABNORMAL HIGH (ref 0.40–1.20)
Glucose, Bld: 90 mg/dL (ref 70–99)
Potassium: 4.7 mEq/L (ref 3.5–5.3)
Sodium: 141 mEq/L (ref 135–145)

## 2010-10-15 LAB — CONVERTED CEMR LAB
BUN: 29 mg/dL — ABNORMAL HIGH (ref 6–23)
CO2: 27 meq/L (ref 19–32)
Calcium: 9.5 mg/dL (ref 8.4–10.5)
Chloride: 103 meq/L (ref 96–112)
Creatinine, Ser: 1.31 mg/dL — ABNORMAL HIGH (ref 0.40–1.20)
Glucose, Bld: 90 mg/dL (ref 70–99)
HCT: 42 % (ref 36.0–46.0)
Hemoglobin: 13.1 g/dL (ref 12.0–15.0)
MCHC: 31.2 g/dL (ref 30.0–36.0)
MCV: 91.9 fL (ref 78.0–100.0)
Platelets: 350 10*3/uL (ref 150–400)
Potassium: 4.7 meq/L (ref 3.5–5.3)
RBC: 4.57 M/uL (ref 3.87–5.11)
RDW: 15.6 % — ABNORMAL HIGH (ref 11.5–15.5)
Sodium: 141 meq/L (ref 135–145)
WBC: 10.6 10*3/uL — ABNORMAL HIGH (ref 4.0–10.5)

## 2010-10-15 NOTE — Patient Instructions (Signed)
Please schedule an appointment in one month for chronic issues. Please come back this month for cholesterol check.  This is a fasting lab, so no food or drink after midnight the next before the blood test.

## 2010-10-15 NOTE — Telephone Encounter (Signed)
Received paper work from [employer]  Discussed that she would need to come in for appt for evaluation for power chair.  Pt feels that she does not need power chair.  She states that if she gets a power chair, then it is like "I am giving up on me."  She states that she requested info many months ago and forgot about it.  The company called her recently and she felt bad telling them "no."  Advised pt that I can fax a note back stating that she does not desire power chair.  Pt agreed to this.

## 2010-10-15 NOTE — Assessment & Plan Note (Addendum)
BP 130/80 and stable.  Cont current meds.  Will check bmet and cbc today.  Needs FLP.

## 2010-10-15 NOTE — Progress Notes (Signed)
  Subjective:    Patient ID: [name], female    DOB: [birth year], 65 y.o.   MRN: [id number]  HPI Subjective:  [name] is a 65 y.o. female with hypertension. Current Outpatient Prescriptions  Medication Sig Dispense Refill  . atorvastatin (LIPITOR) 40 MG tablet Take 40 mg by mouth at bedtime.        . ferrous sulfate 325 (65 FE) MG tablet Take 325 mg by mouth 2 (two) times daily.        . furosemide (LASIX) 20 MG tablet Take 20 mg by mouth daily.        . hydrochlorothiazide 25 MG tablet Take 25 mg by mouth daily.        . metoprolol (TOPROL-XL) 50 MG 24 hr tablet Take 50 mg by mouth daily.        Marland Kitchen spironolactone (ALDACTONE) 25 MG tablet Take 25 mg by mouth daily.        . verapamil (CALAN-SR) 240 MG CR tablet Take 240 mg by mouth at bedtime.        Marland Kitchen warfarin (COUMADIN) 5 MG tablet 5 mg. Take as directed.         Hypertension ROS: taking medications as instructed, no medication side effects noted, no TIA's, no chest pain on exertion, no dyspnea on exertion and no swelling of ankles.  New concerns: weight gain, .   Objective:  BP 130/80  Pulse 64  Wt 270 lb (122.471 kg)  Appearance alert, well appearing, and in no distress, oriented to person, place, and time, overweight, acyanotic, in no respiratory distress and well hydrated. General exam BP noted to be well controlled today in office, S1, S2 normal, no gallop, no murmur, chest clear, no JVD, no HSM, no edema.  Lab review: labs are reviewed, up to date and normal, labs reviewed, I note that glycosylated hemoglobin (none), lipids LDL (89) result meets goal, HDL normal (47) , triglycerides normal (63), electrolytes normal, renal functions abnormal but stable, has CKD III, basic metabolic panel normal.   Assessment:   Hypertension stable.   Plan:  Current treatment plan is effective, no change in therapy..    Review of Systems Per hpi     Objective:   Physical Exam General:  Well-developed,well-nourished,in no  acute distress; alert,appropriate and cooperative throughout examination. vitals reviewed.  Neck:  No deformities, masses, or tenderness noted. No bruit.  Lungs:  Normal respiratory effort, chest expands symmetrically. Lungs are clear to auscultation, no crackles or wheezes. Heart:  Normal rate and regular rhythm. S1 and S2 normal without gallop, murmur, click, rub or other extra sounds. Abdomen:  Bowel sounds positive,abdomen soft and non-tender without masses, organomegaly or hernias noted. Obese. Extremities:  No clubbing, cyanosis, edema, or deformity noted with normal full range of motion of all joints.   Neurologic:  alert & oriented X3 and cranial nerves II-XII intact.   Cervical Nodes:  No lymphadenopathy noted        Assessment & Plan:

## 2010-10-23 ENCOUNTER — Ambulatory Visit (INDEPENDENT_AMBULATORY_CARE_PROVIDER_SITE_OTHER): Payer: Medicare Other | Admitting: Sports Medicine

## 2010-10-23 ENCOUNTER — Encounter: Payer: Self-pay | Admitting: Sports Medicine

## 2010-10-23 DIAGNOSIS — M76899 Other specified enthesopathies of unspecified lower limb, excluding foot: Secondary | ICD-10-CM

## 2010-10-23 DIAGNOSIS — M25561 Pain in right knee: Secondary | ICD-10-CM | POA: Insufficient documentation

## 2010-10-23 DIAGNOSIS — M25562 Pain in left knee: Secondary | ICD-10-CM | POA: Insufficient documentation

## 2010-10-23 DIAGNOSIS — M7062 Trochanteric bursitis, left hip: Secondary | ICD-10-CM

## 2010-10-23 DIAGNOSIS — M25569 Pain in unspecified knee: Secondary | ICD-10-CM

## 2010-10-23 DIAGNOSIS — E785 Hyperlipidemia, unspecified: Secondary | ICD-10-CM

## 2010-10-23 LAB — LIPID PANEL
Cholesterol: 165 mg/dL (ref 0–200)
HDL: 52 mg/dL (ref >39)
LDL Cholesterol: 101 mg/dL — ABNORMAL HIGH (ref 0–99)
Total CHOL/HDL Ratio: 3.2 Ratio
Triglycerides: 61 mg/dL (ref <150)
VLDL: 12 mg/dL (ref 0–40)

## 2010-10-23 NOTE — Assessment & Plan Note (Signed)
Internal derangement. 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   Complete resolution of pain with injection, pt so happy she hugged me. Expect at least a few months of painlessness. Can re-inject in 3 months if needed. Will see her again prn.

## 2010-10-23 NOTE — Progress Notes (Addendum)
  Subjective:    Patient ID: [name], female    DOB: November 18, 1945, 65 y.o.   MRN: [id number]  HPI L trochanteric bursitis:  Injected at last visit, completely painless now, pt VERY happy.  R knee pain:  Started after the pain in her L hip went away.  Worst at ant medial joint line.  Unable to elicit any precip/palliating factors.  Some popping, no grinding, catching, locking.  No swelling.  Had 1 round of supartz injections at [facility] with Dr. Turner Daniels.     Review of Systems    See HPI Objective:   Physical Exam  Constitutional: She appears well-developed and well-nourished. No distress.  Skin: Skin is warm and dry.  R Knee: Normal to inspection with no erythema or effusion or obvious bony abnormalities. Palpation normal with no warmth, some ant/medial joint line tenderness, no patellar tenderness, or condyle tenderness. ROM full in flexion and extension and lower leg rotation. Ligaments with solid consistent endpoints including ACL, PCL, LCL, MCL. She did have some pain but no opening of the joint line with valgus stress. Positive Mcmurray's with pain but no pop. Non painful patellar compression. Patellar glide without crepitus. Patellar and quadriceps tendons unremarkable. Hamstring and quadriceps strength is normal.      Consent obtained and verified. Sterile betadine prep. Furthur cleansed with alcohol. Topical analgesic spray: Ethyl chloride. Joint: R knee Approached in typical fashion with: 25g needle in anteromedial approach just medial to patellar tendon at inferior pole of patella. Completed without difficulty Meds: 3cc lidocaine, 3cc marcaine, 1cc kenalog 40 Aftercare instructions and Red flags advised.  Assessment & Plan:

## 2010-10-23 NOTE — Assessment & Plan Note (Signed)
Completely resolved s/p injection, this is the best her L leg has felt in years.

## 2010-11-05 ENCOUNTER — Ambulatory Visit: Payer: Medicare Other

## 2010-11-12 ENCOUNTER — Ambulatory Visit: Payer: Medicare Other

## 2010-11-12 LAB — PROTIME-INR
INR: 2 — ABNORMAL HIGH (ref 0.00–1.49)
INR: 2.1 — ABNORMAL HIGH (ref 0.00–1.49)
INR: 2.4 — ABNORMAL HIGH (ref 0.00–1.49)
INR: 2.7 — ABNORMAL HIGH (ref 0.00–1.49)
INR: 2.9 — ABNORMAL HIGH (ref 0.00–1.49)
INR: 3.2 — ABNORMAL HIGH (ref 0.00–1.49)
Prothrombin Time: 23.8 seconds — ABNORMAL HIGH (ref 11.6–15.2)
Prothrombin Time: 25.3 seconds — ABNORMAL HIGH (ref 11.6–15.2)
Prothrombin Time: 27.8 seconds — ABNORMAL HIGH (ref 11.6–15.2)
Prothrombin Time: 30.7 seconds — ABNORMAL HIGH (ref 11.6–15.2)
Prothrombin Time: 32.5 seconds — ABNORMAL HIGH (ref 11.6–15.2)
Prothrombin Time: 35.4 seconds — ABNORMAL HIGH (ref 11.6–15.2)

## 2010-11-12 LAB — BASIC METABOLIC PANEL
BUN: 16 mg/dL (ref 6–23)
BUN: 19 mg/dL (ref 6–23)
BUN: 24 mg/dL — ABNORMAL HIGH (ref 6–23)
BUN: 31 mg/dL — ABNORMAL HIGH (ref 6–23)
CO2: 26 mEq/L (ref 19–32)
CO2: 30 mEq/L (ref 19–32)
CO2: 30 mEq/L (ref 19–32)
CO2: 30 mEq/L (ref 19–32)
Calcium: 8.4 mg/dL (ref 8.4–10.5)
Calcium: 8.4 mg/dL (ref 8.4–10.5)
Calcium: 8.7 mg/dL (ref 8.4–10.5)
Calcium: 9.6 mg/dL (ref 8.4–10.5)
Chloride: 103 mEq/L (ref 96–112)
Chloride: 92 mEq/L — ABNORMAL LOW (ref 96–112)
Chloride: 96 mEq/L (ref 96–112)
Chloride: 97 mEq/L (ref 96–112)
Creatinine, Ser: 1.16 mg/dL (ref 0.4–1.2)
Creatinine, Ser: 1.29 mg/dL — ABNORMAL HIGH (ref 0.4–1.2)
Creatinine, Ser: 1.38 mg/dL — ABNORMAL HIGH (ref 0.4–1.2)
Creatinine, Ser: 1.41 mg/dL — ABNORMAL HIGH (ref 0.4–1.2)
GFR calc Af Amer: 46 mL/min — ABNORMAL LOW (ref >60)
GFR calc Af Amer: 47 mL/min — ABNORMAL LOW (ref >60)
GFR calc Af Amer: 51 mL/min — ABNORMAL LOW (ref >60)
GFR calc Af Amer: 57 mL/min — ABNORMAL LOW (ref >60)
GFR calc non Af Amer: 38 mL/min — ABNORMAL LOW (ref >60)
GFR calc non Af Amer: 39 mL/min — ABNORMAL LOW (ref >60)
GFR calc non Af Amer: 42 mL/min — ABNORMAL LOW (ref >60)
GFR calc non Af Amer: 47 mL/min — ABNORMAL LOW (ref >60)
Glucose, Bld: 116 mg/dL — ABNORMAL HIGH (ref 70–99)
Glucose, Bld: 120 mg/dL — ABNORMAL HIGH (ref 70–99)
Glucose, Bld: 180 mg/dL — ABNORMAL HIGH (ref 70–99)
Glucose, Bld: 87 mg/dL (ref 70–99)
Potassium: 3 mEq/L — ABNORMAL LOW (ref 3.5–5.1)
Potassium: 4.1 mEq/L (ref 3.5–5.1)
Potassium: 4.2 mEq/L (ref 3.5–5.1)
Potassium: 4.4 mEq/L (ref 3.5–5.1)
Sodium: 130 mEq/L — ABNORMAL LOW (ref 135–145)
Sodium: 133 mEq/L — ABNORMAL LOW (ref 135–145)
Sodium: 133 mEq/L — ABNORMAL LOW (ref 135–145)
Sodium: 138 mEq/L (ref 135–145)

## 2010-11-12 LAB — CBC
HCT: 23.8 % — ABNORMAL LOW (ref 36.0–46.0)
HCT: 25.6 % — ABNORMAL LOW (ref 36.0–46.0)
HCT: 26.8 % — ABNORMAL LOW (ref 36.0–46.0)
HCT: 28.1 % — ABNORMAL LOW (ref 36.0–46.0)
HCT: 37.7 % (ref 36.0–46.0)
Hemoglobin: 12.6 g/dL (ref 12.0–15.0)
Hemoglobin: 7.9 g/dL — CL (ref 12.0–15.0)
Hemoglobin: 8.7 g/dL — ABNORMAL LOW (ref 12.0–15.0)
Hemoglobin: 9.2 g/dL — ABNORMAL LOW (ref 12.0–15.0)
Hemoglobin: 9.5 g/dL — ABNORMAL LOW (ref 12.0–15.0)
MCHC: 33.3 g/dL (ref 30.0–36.0)
MCHC: 33.5 g/dL (ref 30.0–36.0)
MCHC: 33.9 g/dL (ref 30.0–36.0)
MCHC: 33.9 g/dL (ref 30.0–36.0)
MCHC: 34.3 g/dL (ref 30.0–36.0)
MCV: 88.1 fL (ref 78.0–100.0)
MCV: 89.1 fL (ref 78.0–100.0)
MCV: 89.6 fL (ref 78.0–100.0)
MCV: 90.1 fL (ref 78.0–100.0)
MCV: 90.6 fL (ref 78.0–100.0)
Platelets: 200 10*3/uL (ref 150–400)
Platelets: 214 10*3/uL (ref 150–400)
Platelets: 234 10*3/uL (ref 150–400)
Platelets: 242 10*3/uL (ref 150–400)
Platelets: 270 10*3/uL (ref 150–400)
RBC: 2.63 MIL/uL — ABNORMAL LOW (ref 3.87–5.11)
RBC: 2.86 MIL/uL — ABNORMAL LOW (ref 3.87–5.11)
RBC: 3.04 MIL/uL — ABNORMAL LOW (ref 3.87–5.11)
RBC: 3.15 MIL/uL — ABNORMAL LOW (ref 3.87–5.11)
RBC: 4.18 MIL/uL (ref 3.87–5.11)
RDW: 15.8 % — ABNORMAL HIGH (ref 11.5–15.5)
RDW: 16 % — ABNORMAL HIGH (ref 11.5–15.5)
RDW: 16.2 % — ABNORMAL HIGH (ref 11.5–15.5)
RDW: 16.6 % — ABNORMAL HIGH (ref 11.5–15.5)
RDW: 16.6 % — ABNORMAL HIGH (ref 11.5–15.5)
WBC: 10.8 10*3/uL — ABNORMAL HIGH (ref 4.0–10.5)
WBC: 11.8 10*3/uL — ABNORMAL HIGH (ref 4.0–10.5)
WBC: 6.7 10*3/uL (ref 4.0–10.5)
WBC: 9.1 10*3/uL (ref 4.0–10.5)
WBC: 9.6 10*3/uL (ref 4.0–10.5)

## 2010-11-12 LAB — TYPE AND SCREEN
ABO/RH(D): AB POS
Antibody Screen: NEGATIVE

## 2010-11-12 LAB — DIFFERENTIAL
Basophils Absolute: 0.2 10*3/uL — ABNORMAL HIGH (ref 0.0–0.1)
Basophils Relative: 3 % — ABNORMAL HIGH (ref 0–1)
Eosinophils Absolute: 0.1 10*3/uL (ref 0.0–0.7)
Eosinophils Relative: 2 % (ref 0–5)
Lymphocytes Relative: 29 % (ref 12–46)
Lymphs Abs: 1.9 10*3/uL (ref 0.7–4.0)
Monocytes Absolute: 0.4 10*3/uL (ref 0.1–1.0)
Monocytes Relative: 6 % (ref 3–12)
Neutro Abs: 4.1 10*3/uL (ref 1.7–7.7)
Neutrophils Relative %: 61 % (ref 43–77)

## 2010-11-12 LAB — URINE MICROSCOPIC-ADD ON

## 2010-11-12 LAB — URINALYSIS, ROUTINE W REFLEX MICROSCOPIC
Bilirubin Urine: NEGATIVE
Glucose, UA: NEGATIVE mg/dL
Hgb urine dipstick: NEGATIVE
Ketones, ur: NEGATIVE mg/dL
Nitrite: NEGATIVE
Protein, ur: NEGATIVE mg/dL
Specific Gravity, Urine: 1.022 (ref 1.005–1.030)
Urobilinogen, UA: 0.2 mg/dL (ref 0.0–1.0)
pH: 6 (ref 5.0–8.0)

## 2010-11-12 LAB — APTT
aPTT: 33 seconds (ref 24–37)
aPTT: 34 seconds (ref 24–37)

## 2010-11-12 LAB — PREPARE RBC (CROSSMATCH)

## 2010-11-12 LAB — ABO/RH: ABO/RH(D): AB POS

## 2010-12-14 ENCOUNTER — Ambulatory Visit (INDEPENDENT_AMBULATORY_CARE_PROVIDER_SITE_OTHER): Payer: Medicare Other | Admitting: *Deleted

## 2010-12-14 ENCOUNTER — Ambulatory Visit (INDEPENDENT_AMBULATORY_CARE_PROVIDER_SITE_OTHER): Payer: Medicare Other | Admitting: Family Medicine

## 2010-12-14 ENCOUNTER — Encounter: Payer: Self-pay | Admitting: Family Medicine

## 2010-12-14 VITALS — BP 134/75 | HR 68 | Temp 97.6°F | Ht 65.0 in | Wt 281.0 lb

## 2010-12-14 DIAGNOSIS — Z7901 Long term (current) use of anticoagulants: Secondary | ICD-10-CM

## 2010-12-14 DIAGNOSIS — K089 Disorder of teeth and supporting structures, unspecified: Secondary | ICD-10-CM

## 2010-12-14 DIAGNOSIS — I2699 Other pulmonary embolism without acute cor pulmonale: Secondary | ICD-10-CM

## 2010-12-14 DIAGNOSIS — K0889 Other specified disorders of teeth and supporting structures: Secondary | ICD-10-CM | POA: Insufficient documentation

## 2010-12-14 LAB — POCT INR: INR: 3.3

## 2010-12-14 MED ORDER — HYDROCODONE-ACETAMINOPHEN 5-325 MG PO TABS: 1.0000 | ORAL_TABLET | ORAL | Status: DC | PRN

## 2010-12-14 MED ORDER — AMOXICILLIN 500 MG PO CAPS: 500.0000 mg | ORAL_CAPSULE | Freq: Three times a day (TID) | ORAL | Status: AC

## 2010-12-14 NOTE — Patient Instructions (Signed)
Stop coumadin 5 days before dental procedure. Restart coumadin 24 hours after procedure. Come to clinic for INR check 1 week after resuming INR.

## 2010-12-14 NOTE — Assessment & Plan Note (Addendum)
Dental pain from dental abscess/cavities.  Tooth #7,8, 9 loose +gingival edema. Will Rx Amox 500mg  tid x 10 days, Hydrocodone #20 tab.  I have advised pt that if she has dental procedure she can stop coumadin 5 days before it and resume 24 hours after procedure.  Pt is to rtc one week after she resumes coumadin for INR check.  Letter was written to dentist.  Written instructions also given to pt.  Pt repeated instructions back to me verbally.

## 2010-12-14 NOTE — Progress Notes (Signed)
  Subjective:    Patient ID: [name], female    DOB: 1945/09/04, 65 y.o.   MRN: [id number]  HPI Lip swelling and gum swelling It was swelling when she woke up this morning.  Pt has at least 4 teeth that needs extraction.  She has been dealing with dental pain x 1 week.  She needs a dental referral but because she is taking coumadin chronically for recurrent PE.  She does not come to clinic regularly to help her INR checked.    Swallowing fine. Breathing fine. She ate this morning without any problems.    Dr Hessie Knows will be able to see pt today but needs referral because she is taking coumadin.   Review of Systems  Constitutional: Negative for fever and chills.  HENT: Positive for facial swelling. Negative for ear pain, rhinorrhea and sneezing.   Respiratory: Negative.   Cardiovascular: Negative.        Objective:   Physical Exam  Constitutional: She appears well-developed and well-nourished. No distress.  HENT:  Mouth/Throat: She does not have dentures. No oral lesions. Abnormal dentition. Dental abscesses, uvula swelling and dental caries present. No lacerations. No oropharyngeal exudate, posterior oropharyngeal edema, posterior oropharyngeal erythema or tonsillar abscesses.  tooth #7,8,9 loose and +gingival edema        Assessment & Plan:

## 2010-12-18 NOTE — Op Note (Signed)
[name], [name]               ACCOUNT NO.:  [id number]   MEDICAL RECORD NO.:  [id number]          PATIENT TYPE:  INP   LOCATION:  5040                         FACILITY:  [facility]   PHYSICIAN:  Feliberto Gottron. Turner Daniels, M.D.   DATE OF BIRTH:  1945-12-21   DATE OF PROCEDURE:  02/03/2009  DATE OF DISCHARGE:                               OPERATIVE REPORT   PREOPERATIVE DIAGNOSIS:  End-stage arthritis, left hip, complicated by  morbid obesity.   POSTOPERATIVE DIAGNOSIS:  End-stage arthritis, left hip, complicated by  morbid obesity.   PROCEDURE:  Complex left total hip arthroplasty using DePuy 54-mm  pinnacle cup, 36-mm metal liner, S-ROM 20 x 15 x 160 x 42 S-ROM stem,  20D small cone, NK +0 36-mm ultimate ball.   SURGEON:  Feliberto Gottron. Turner Daniels, MD   FIRST ASSISTANT:  Shirl Harris, PA-C   ANESTHETIC:  General endotracheal.   ESTIMATED BLOOD LOSS:  500 mL.   FLUID REPLACEMENT:  2 L of crystalloid.   DRAINS PLACED:  Foley catheter and 2 medium Hemovacs.   URINE OUTPUT:  About 400 mL.   INDICATIONS FOR PROCEDURE:  A 65 year old woman, who is little over 300  pounds in weight and a little under 5 feet 4 inches in height, has end-  stage arthritis of her left hip and had a successful right total hip  arthroplasty 5 years ago using a metal-on-metal bearing system that was  actually performed quite well.  She desires elective total hip  arthroplasty on the left side because of end-stage arthritis and bone-on-  bone arthritic changes and is well aware of the risks and benefits of  surgery and the increased risks that accompanies her morbid obesity.  Her BMI is some more over 40.  In any event, she desires elective total  hip arthroplasty on the left side.  Risks and benefits of surgery are  discussed, questions are answered.  She also had a PE about 2 years ago  and is on chronic Coumadin.  We have elected to not get her INR down the  normal.  We got it down to 2 on the morning of surgery  and I think that  is a good choice for her because of the hypercoagulable state she is in  and we can except some bleeding in exchange for hopefully preventing a  perioperative clot or PE.   DESCRIPTION OF PROCEDURE:  The patient was identified by armband and  received preoperative antibiotics in the holding area at [facility].  She was then taken to the operating room #15.  Appropriate  anesthetic monitors were attached.  General endotracheal anesthesia was  induced with the patient in supine position, rolled into the right  lateral decubitus position, fixed over the Stulberg Mark II pelvic  clamp.  The left lower extremity was prepped and draped in the usual  sterile fashion from the ankle to the hemipelvis.  A standard time-out  procedure was performed.  Skin along the lateral hip and thigh was  infiltrated with 25 mL of 0.5% Marcaine and epinephrine solution and  then an incision  that was about 30 cm in length centered over the  greater trochanter was made through the skin and subcutaneous tissue  allowing posterolateral approach to the hip.  Adson-Beckman retractors  were used to get the fat pad all the way as we worked our way down to  the iliotibial band which was cut along the skin incision exposing the  greater trochanter.  Cobra retractors were placed between the gluteus  minimus and superior hip joint capsule and between the quadratus femoris  and the inferior hip joint capsule isolating the short external rotators  and piriformis.  These were cut off near insertion on the  intertrochanteric crest and tagged with a #2 Ethibond suture.  This  exposed the posterior aspect of the hip joint capsule from superior to  inferior and starting on the edge of the acetabulum we cut down towards  the femoral neck inferiorly along the femoral neck and back out towards  the acetabulum creating an acetabular-based capsular flap that was  likewise tagged with two #2 Ethibond sutures.   This exposed the femoral  head.  We flexed and internally rotated the hip dislocating the femoral  head.  A standard neck cut was performed 1 fingerbreadth above the  lesser trochanter and the femoral head was noted to have large  peripheral osteophytes and down to bare bone.  No articular cartilage on  metaphyseal region.  Levering the proximal femur off the anterior column  with Homan retractor, we placed a spike Cobra retractor in the cotyloid  notch and a posterior inferior wing retractor at the junction of the  ischium and the acetabulum.  This allowed [place] to perform a labrectomy and  sequentially reamed the acetabulum up to a 53-mm basket reamer obtaining  good coverage in all quadrants and we just touched the rim with a 54  basket reamer and removed peripheral osteophytes.  A 54-mm DePuy  pinnacle cup was then selected and hammered into place and 45 degrees of  abduction and about 20 degrees of anteversion obtaining good firm fit  and fill and you could easily lever the pelvis on the table with the  inserter attached.  No supplemental screws were required.  A central  occluder screw was then placed followed by a 36-mm metal liner which  hammered into place without difficulty.  We then flexed and internally  rotated the hip exposing the proximal femur which was entered with a box  cutting chisel followed by initiating reamer followed by sequential  reaming up to a 14-mm axial reamer where we started to obtain cortical  chatter.  We continued reaming up to 15.5 obtaining good chatter at 15.5  and partially reamed to 16.  We then conically reamed up to a 20D cone  at the appropriate depth for a 42 base neck.  We then milled the calcar  up to a 20 D small calcar and placed a 20D small trial cone in place  followed by a trial stem with a 42 base neck and an NK +0 head.  The hip  was then reduced.  Stability was noted to flexion of about 80 limited by  adipose tissue with internal rotation  of 75 again limited by adipose  tissue.  The hip came to full extension, external rotated to 30 degrees,  and could not be dislocated in extension.  At this point, the trial  components were removed.  Wound was irrigated out with normal saline  solution.  A 20D small ZTT-1 S-ROM  cone was then hammered into place in  the proximal femur followed by a 20 x 15 x 160 x 42 X-ROM stem in the  same version as calcar.  Once this was seated, an NK +0 36-mm ultimate  ball was hammered onto the stem and hip was again reduced.  Stability  was checked and found to be excellent.  The capsular flap and short  external rotators were repaired back to the intertrochanteric crest  through drill holes with a #2 Ethibond suture.  A medium Hemovac drain  was placed deep in the acetabulum and a second Hemovac was then laid in  the subcutaneous tissue.  Wound was irrigated out one more time with  normal saline solution.  The IT band was closed with running #1 Vicryl  suture and then the subcutaneous tissue was closed in layers using 2  layers of 0 Vicryl suture, 2 layers of 2-0 Vicryl suture, and finally  skin staples on the skin.  A dressing of Mepilex and required 2 of the  full size Mepilex dressings was then applied and a smaller dressing on  the drains which exited about 2 inches anterior to the wound.  At this  point, the patient was unclamped, rolled supine, and transferred to the  recovery room bed, being careful to maintain gentle traction on the leg,  so that there was no chance of dislocating her new hip.  She was then  awakened, extubated, and taken to the recovery room without difficulty.      Feliberto Gottron. Turner Daniels, M.D.  Electronically Signed     FJR/MEDQ  D:  02/03/2009  T:  02/03/2009  Job:  [id number]

## 2010-12-18 NOTE — Op Note (Signed)
[name], [name]               ACCOUNT NO.:  [id number]   MEDICAL RECORD NO.:  [id number]          PATIENT TYPE:  AMB   LOCATION:  SDS                          FACILITY:  [facility]   PHYSICIAN:  Feliberto Gottron. Turner Daniels, M.D.   DATE OF BIRTH:  17-Apr-1946   DATE OF PROCEDURE:  07/15/2008  DATE OF DISCHARGE:  07/15/2008                               OPERATIVE REPORT   PREOPERATIVE DIAGNOSES:  Left shoulder impingement syndrome,  acromioclavicular joint arthritis, and partial thickness internal  leaflet rotator cuff tear with loose body in the acromioclavicular  joint.   POSTOPERATIVE DIAGNOSES:  Left shoulder impingement syndrome,  acromioclavicular joint arthritis, and partial thickness internal  leaflet rotator cuff tear with loose body in the acromioclavicular  joint.   PROCEDURE:  Left shoulder arthroscopic anterior-inferior acromioplasty,  removal of osteochondral loose body from the acromioclavicular joint,  formal distal clavicle excision, and debridement of partial thickness  internal leaflet rotator cuff tear.   SURGEON:  Feliberto Gottron. Turner Daniels, MD   FIRST ASSISTANT:  Shirl Harris PA-C   ANESTHETIC:  General endotracheal.   ESTIMATED BLOOD LOSS:  Minimal.   FLUID REPLACEMENT:  A liter of crystalloid.   DRAINS PLACED:  None.   TOURNIQUET TIME:  None.   INDICATIONS FOR PROCEDURE:  A 65 year old woman with left shoulder  impingement syndrome and symptomatic AC joint arthritis who has failed  conservative treatment, anti-inflammatory medicines, physical therapy,  and cortisone injections provided only temporary relief, who desires  elective arthroscopic anterior-inferior acromioplasty and formal distal  clavicle excision.  She does have a 1-cm, type 2 subacromial spur as  well as debridements or repair of any rotator cuff pathology.  Risks and  benefits of surgery were discussed, questions answered.  This surgery is  being done at [facility] because of a history of a  pulmonary  emboli as well as morbid obesity.   DESCRIPTION OF PROCEDURE:  The patient was identified by armband and  underwent left shoulder interscalene block anesthetic at [facility] in the block area.  She was given IV antibiotics preoperatively  and then taken to the operating room where the appropriate anesthetic  monitors were attached and general endotracheal anesthesia was induced  with the patient in supine position.  She was then placed in the beach  chair position.  The left upper extremity was prepped and draped in  usual sterile fashion from the wrist to the hemithorax.  Using a #11  blade, standard portals were then made 1.5 cm anterior to the [facility] joint  lateral to the junction of middle and posterior thirds acromion and  posterior to posterolateral corner of acromion process.  The inflow was  placed anteriorly and the arthroscope laterally, and a 4.2 [place]  sucker shaver posteriorly allowing subacromial bursectomy.  We then  carefully evaluated the rotator cuff and externally, there was some  minimal tearing, but certainly no full-thickness tearing.  The  subacromial spur was then removed with a 4.5 hooded vortex bur going  from posterior to anterior creating a flat type 1 acromion.  This spur  itself  was about 8-9 mm in size.  This exposed the arthritic AC joint  which was down to bare bone arthritic changes and a distal inferior  centimeter of the clavicle was then resected.  We then switched portals,  bringing the scope in posteriorly, the inflow laterally, and the  arthroscope anteriorly, allowing [place] to complete the distal clavicle  excision removing a full centimeter of the distal clavicle and  photographic documentation was made of this.  At this time, we also  identified an osteochondral loose body in the [facility] joint which was  removed.  The arthroscope was then repositioned into the glenohumeral  joint where we documented relatively normal-appearing  articular  cartilage of the glenoid and humerus.  The labrum had some minimal  degenerative tearing.  The biceps and biceps anchor were intact.  There  was some internal leaflet rotator cuff tearing about 20% of the fibers  and this was debrided back to a stable margin with the 4.2 [place]  sucker shaver.  The shoulder was then irrigated out with normal saline  solution.  The arthroscopic instruments were removed, and a dressing of  Xeroform, 4x4 dressing, sponges, paper tape, and a sling were applied.  The patient was then awakened and taken to the recovery room without  difficulty.      Feliberto Gottron. Turner Daniels, M.D.  Electronically Signed     FJR/MEDQ  D:  08/08/2008  T:  08/09/2008  Job:  [id number]

## 2010-12-21 NOTE — Op Note (Signed)
NAME:  [name], [name]                         ACCOUNT NO.:  [id number]   MEDICAL RECORD NO.:  [id number]                   PATIENT TYPE:  INP   LOCATION:  3305                                 FACILITY:  [facility]   PHYSICIAN:  Larina Earthly, M.D.                 DATE OF BIRTH:  11/28/1945   DATE OF PROCEDURE:  09/02/2003  DATE OF DISCHARGE:                                 OPERATIVE REPORT   PREOPERATIVE DIAGNOSIS:  Pulmonary embolus and left popliteal deep venous  thrombosis.   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   PROCEDURE:  Attempted vena caval filter replacement via the right and left  femoral venous puncture and finally right IJ venous puncture.   SURGEON:  Larina Earthly, M.D.   ANESTHESIA:  1% lidocaine local.   COMPLICATIONS:  None.   CONDITION:  Admission to holding area, stable.   INDICATIONS FOR PROCEDURE:  The patient is a 65 year old black female with a  prior history of deep venous thrombosis.  She was admitted with chest pain  and had a CT scan which revealed saddle pulmonary embolus and also venous  lower extremity study revealing a left popliteal deep venous thrombosis.  It  was recommended that the patient undergo vena cava filter placement with  presumed acute pulmonary embolus in the face of anticoagulation. Her INR was  subtherapeutic at 1.7.   DESCRIPTION OF PROCEDURE:  The patient was taken to the peripheral vascular  catheterization laboratory and placed in the supine position where the areas  of the right and left groin were prepped and draped in usual sterile  fashion.  Using local anesthesia,  attempts were made at cannulating the  right common femoral vein.  The patient had an ultrasound on the afternoon  of the procedure which revealed presumed patency of the right common femoral  vein.  Despite multiple attempts, the vein could not be cannulated.  The  artery  was entered on occasion and hemostasis was held with pressure.  The  decision was then made to attempt the left groin.  Again, the attempts at  accessing the left common femoral vein were also unsuccessful.  After  multiple attempts, the decision was made to attempt a right IJ puncture.  The patient was re-prepped and draped.  The legs were elevated on pillows.  Using local anesthesia and a finder needle, the right internal jugular vein  was easily entered.  A guide wire was passed down to the level of the  inferior vena cava.  The trapeze introducer was positioned over the guide  wire down to the level of the renal veins.  Hand injections through the  sheath were obtained.  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  There was no evidence of  collateral flow around this.  There did appear to be reflux seen in the right  renal vein.  Due to complete occlusion of the infrarenal cava, the decision  to proceed with a renal filter was discussed and it was felt that there was  no possibility for pulmonary embolus from  the lower extremities due to the chronic occlusion of the cava.  It was also  questioned whether or not the pulmonary embolus was acute or chronic as  well.  The sheath was removed from the right internal jugular vein and  pressure was held for hemostasis.  The patient was returned to the floor in  stable condition.                                               Larina Earthly, M.D.    TFE/MEDQ  D:  09/02/2003  T:  09/03/2003  Job:  [id number]

## 2010-12-21 NOTE — Discharge Summary (Signed)
NAME:  [name], [name]                         ACCOUNT NO.:  [id number]   MEDICAL RECORD NO.:  [id number]                   PATIENT TYPE:  IPS   LOCATION:  4153                                 FACILITY:  [facility]   PHYSICIAN:  [name], M.D.                DATE OF BIRTH:  06-19-1946   DATE OF ADMISSION:  04/22/2003  DATE OF DISCHARGE:  04/29/2003                                 DISCHARGE SUMMARY   DISCHARGE DIAGNOSES:  1. 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  2. History of pulmonary embolism and deep vein thrombosis.  3. Anemia.  4. History of hypertension.  5. Obesity.  6. Deep venous thrombosis prophylaxis.   HISTORY OF PRESENT ILLNESS:  The patient is a 65 year old black female with  a past medical history of PE x3 and right hip pain.  She has failed  conservative care.  Elected to undergo a right total hip replacement on  April 18, 2003, by Dr. Gean Sharp.  The patient is on Coumadin for DVT  prophylaxis.  PT report at this time indicates the patient is ambulating  approximately 40 feet modified independent with a rolling walker, bed  mobility minimal assist.  Hospital course significant for anemia.  The  patient was transferred to [facility] Department on April 22, 2003.   PAST MEDICAL HISTORY:  Significant for as above.  Denies diabetes or  cardiovascular disease.   PRIMARY CARE PHYSICIAN:  [name], M.D. at [facility].   PAST SURGICAL HISTORY:  Tubal ligation.   MEDICATIONS PRIOR TO ADMISSION:  1. Covera 240 mg q.h.s.  2. Coumadin 6 mg q.p.m.  3. HCTZ 25 mg q.d.  4. Spironolactone 25 mg b.i.d.  5. Vicodin p.r.n.   FAMILY HISTORY:  Noncontributory.   REVIEW OF SYSTEMS:  Significant for shortness of breath, nausea, vomiting,  joint pain, chest pain, joint swelling.   SOCIAL HISTORY:  The patient lives with daughter (25 year old) in an  apartment.  Independent prior to admission.  She uses  crutches.  Denies any  tobacco or alcohol use.  She is retired from [employer].  Is presented on  disability.  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  Limited family support.  She has one step to entry.  She is planning to go to her sister's house at  the time of discharge.   ALLERGIES:  None.   HOSPITAL COURSE:  Mrs. [name] was admitted to [facility]  Department on April 22, 2003, for comprehensive inpatient rehabilitation  where she received more than three hours of therapy.  Overall, [name]  made great progress during her seven day stay.  She was discharged at a  modified independent level.  At time of discharge, the patient was able to  ambulate greater than 400 feet at modified independent level.  She is bed  mobility modified independent and transfers modified  independently.  The  patient remained on Coumadin for DVT prophylaxis throughout her entire stay  in rehab.  Surgical incision healed very well.  During the first few days of  rehab there was serous drainage from her right hip incision.  The patient  was started on antibiotics empirically and this was discontinued.  White  blood cell count remained within normal limits.  The patient had no  significant fevers while in rehab.   The patient was started on Trinsicon one tablet b.i.d. for anemia.  The  patient remained on her blood pressure medications, verapamil as well as  HCTZ and Aldactone throughout her entire stay in rehab.  Blood pressure was  under fair control.  The patient was started on potassium supplement due to  hypokalemia on April 25, 2003.  The patient had a potassium level of  2.8.  She was started on K-Dur 20 mEq p.o. t.i.d. for two days.  Repeat  potassium level was normalized.  There were no other major issues that  occurred while the patient was in rehab.   LABORATORY DATA:  Latest labs indicate latest INR was 3, performed on  April 28, 2003.  Latest sodium level was 133,  potassium 3.6, chloride  96, CO2 22, glucose 112, BUN 13, creatinine 1.1, AST 22, ALT 21.  Urine  culture performed on April 23, 2003, demonstrated 75,000 colonies multi-  species present with no uropathogens isolated.   At time of discharge, all vitals were stable.  Blood pressure is 127/62,  respiratory rate was 20, pulse was 91, temperature 96.8.  Surgical incision  still had mattress sutures intact with very minimum drainage.  Lower  extremity had about 1+ edema.  PT report indicated the patient is ambulating  greater than 400 feet modified independent level.  Chair to stand modified  independent level.  She can perform all ADLs modified independent level.  The patient was discharged home with her family.   DISCHARGE MEDICATIONS:  1. Coumadin 4 mg daily.  2. Multivitamin one tablet daily.  3. HCTZ 25 mg daily.  4. Spironolactone 25 mg daily.  5. Trinsicon one tablet b.i.d.  6. Covera 200 mg daily.  7. Oxycodone 5 to 10 mg q.4-6h. as needed for pain.  8. Os-Cal plus D one tablet daily.  9. Pain management includes oxycodone and Tylenol.   ACTIVITY:  Observe hip precautions.  She is weightbearing as tolerated.  No  smoking, use walker, no drinking alcohol, no driving.   DIET:  Low salt.   She has sutures to be removed at Dr. Wadie Sharp office.  She will have [facility]  [facility] and [facility] from [facility]  Home health R.N. to draw PT and INR and  call in the results to Dr. Audria Sharp or [name] on May 02, 2003.  She is to follow up with Dr. Turner Sharp next week for sutures removed, and follow  up for appointment.  Follow up with [facility] in two to four weeks to  monitor anemia.       [name], P.A.                       [name], M.D.    LH/MEDQ  D:  04/29/2003  T:  05/01/2003  Job:  [id number]   cc:   [name]. [name], M.D.  [address]  [place]  [place] [address]  Fax: [phone]   [facility]   [name], M.D. [address]  [address]   [place]  [place] [address]  Fax: [phone]

## 2010-12-21 NOTE — Discharge Summary (Signed)
NAME:  [name], [name]                         ACCOUNT NO.:  [id number]   MEDICAL RECORD NO.:  [id number]                   PATIENT TYPE:  INP   LOCATION:  3029                                 FACILITY:  [facility]   PHYSICIAN:  Lawerance Sabal, MD                   DATE OF BIRTH:  [birth year]   DATE OF ADMISSION:  09/01/2003  DATE OF DISCHARGE:  09/29/2003                                 DISCHARGE SUMMARY   ADDENDUM   1. Pulmonary embolism and deep venous thrombosis.  Heparin drip was     restarted and Coumadin titrated to reach therapy goal.  The patient was     discharged with life long Coumadin and will follow up with the [facility].  2. DVT of the left leg.  The patient developed swelling, erythema and     tenderness of the left leg and thigh during her hospital stay.     Supportive measures were done, including pain medications, elevation of     the leg and warm compress prior to discharge to facilitate the decrease     of swelling and tenderness and erythema of the left leg and thigh.  3. Hydronephrosis, secondary to retroperitoneal hematoma.  The patient's     hemoglobin was noted to drop to as low as 7.1.  A CT scan of the abdomen     showed retroperitoneal hematoma, which was presumed to be causing the     hydronephrosis.  Hemoglobin and hematocrit monitoring was done.  The     patient was transfused with four units of radiated packed RBCs.  Repeat     CT of the abdomen showed resolution of the hematoma and the     hydronephrosis.  A Urology consult was done because of the persistent     hematuria and the CT scan findings.  Dr. Annabell Howells suggested following     creatinine closely and monitoring her clinically.  He advised that if the     patient developed  , then possible cystoscopy, retrograde pyelography and     possible ureteroscopy may be required.  Fortunately, the patient's     symptoms did not progress and so no intervention for the hydronephrosis     was  indicated.  4. Renal insufficiency.  Creatine was monitored.  On the day of discharge,     the patient's creatinine was 1.8.  The patient had good urine output.  5. Urinary tract infection.  The patient had a urine culture, which grew     Klebsiella sensitive to Ciprofloxacin.  The patient was maintained on     Ciprofloxacin 500 mg p.o. b.i.d., which she took for seven days.  6. Hypertension.  The patient was maintained on HCTZ, spironolactone and     Verapamil with good blood pressure control.  7. Anemia, probably secondary to retroperitoneal hematoma.  The patient  received a total of four unit of packed RBCs.  Hemoglobin and hematocrit     remained stable and retroperitoneal hematoma was noted to resolve.  The     patient was started on iron supplement.  8. Electrolytes and nutrition.  The patient was started on Resource 440 cc     b.i.d.  9. The patient underwent physical therapy.  Supportive measures.  They     consented to  , which was started.  The patient will need to undergo home     physical therapy so that she can recuperate from these unfortunate     events.    CONDITION ON DISCHARGE:  The patient was discharged with no complaint.  She  did not complain of shortness of breath, dyspnea.  No further episodes of  bleeding.  Hemoglobin and hematocrit were stable.  Blood pressure was 123 to  138/72 to 80, pulse rate 84 to 92, with O2 sats of 95 to 97% on room air.  The patient was afebrile.  There was noted decreased swelling of the left  leg and left thigh with decreased erythema and tenderness.  The patient was  advised to follow up at [facility] and their family M.D., Dr.  Evonnie Pat.   PERTINENT LABORATORY DATA ON DISCHARGE:  Hemoglobin 10.2, hematocrit 30.4,  WBC 7.4, platelets 387, PT 20.9, INR of 2.3, sodium of 138, potassium 3.8,  chloride of 106, CO2 of 26, glucose of 88, BUN of 12, creatinine of 1.8,  calcium of 9.2, AST of 30, ALT of 32, alkaline  phosphatase 95, total  bilirubin 0.7.  Urine culture more than 100,000 of colonies of Klebsiella  pneumoniae, sensitive to Ciprofloxacin.   COORDINATION STUDIES:  Factor V Leiden mutations negative.  Protein-C 112  with a reference range of 63 to 153.  Anticardiolipin IgA less than 14.  Anticardiolipin IgG less 12.  Anticardiolipin IgM less than 11.  Lipid  profile:  Cholesterol 203, triglycerides 115, HDL cholesterol 140, LDL  cholesterol 23.  Transthoracic echocardiogram showed overall level  ventricular systolic function was normal.  Left ventricular ejection  fracture was estimated to be 55 to 65%.  Left ventricular wall thickness was  mildly increased.  Right ventricle was moderately dilated.  Right  ventricular systolic function was mildly reduced.  EKG:  Normal sinus  rhythm, possible left atrial enlargement.  Right bundle branch block.  Left  anterior fascicular block.  Chest x-ray:  Negative for acute cardiopulmonary  process.  CT of the chest showed massive pulmonary embolism with the site of  the embolus at the bifurcation of the pulmonary artery.  CT of the abdomen:  New moderate hemorrhage along the perisplenic and perihepatic regions.  Bibasilar atelectasis.  CT of the pelvis showed moderate to large right  inferior pelvic hematoma.  Repeat CT of the abdomen and pelvis showed  marginal decrease in the size of the right pelvic hematoma.   The dictation ended at this point.                                                Lawerance Sabal, MD    MC/MEDQ  D:  10/18/2003  T:  10/21/2003  Job:  [id number]

## 2010-12-21 NOTE — Consult Note (Signed)
NAME:  ANTHONY, ROLAND                         ACCOUNT NO.:  [id number]   MEDICAL RECORD NO.:  [id number]                   PATIENT TYPE:  INP   LOCATION:  3310                                 FACILITY:  [facility]   PHYSICIAN:  Excell Seltzer. Annabell Howells, M.D.                 DATE OF BIRTH:  05-28-1946   DATE OF CONSULTATION:  09/12/2003  DATE OF DISCHARGE:                                   CONSULTATION   CHIEF COMPLAINT:  Right hydronephrosis.   HISTORY:  Ms. Ivanoff is a 65 year old black female with a history of DVT  with pulmonary embolus several years ago who was admitted on September 01, 2003 with a presyncopal episode.  She has since been found to have recurrent  DVT with pulmonary embolus.  She had been placed on anticoagulation therapy,  but continued to have problems, eventually requiring an attempted placement  of a vena cava filter on September 02, 2003.  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  A filter  was not placed because of the complete occlusion.  Following this procedure,  the patient developed the acute onset of gross hematuria and also had right  lower quadrant pain.  She was found to have a progressive anemia and CT  scanning of the abdomen and pelvis eventually demonstrated a large  retroperitoneal bleed with the greatest volume of blood being in the right  pelvis.  The blood displaced the bladder laterally as well as the other  pelvic structures.  On CT scan she was also noted to have right  hydronephrosis.  I reviewed those films with Dr. Sherrine Maples T. Reunion today and  the ureter can be seen adjacent to some calcifications in the pelvis, which  on one review were felt to possibly be ureteral stones, but Dr. Fredia Sorrow  feels fairly strongly that they are phleboliths.  The hematoma in the pelvis  is of sufficient size that ureteral obstruction is a  distinct possibility.  The patient has since undergone successful placement of inferior vena cava  filter from a right jugular vein approach by interventional radiology and  her anticoagulation has been stopped to allow resolution of the hematoma.  Her hematuria has since resolved.  She did have a urinalysis on September 02, 2003 which was negative.  She currently reports some right lower quadrant  discomfort, but no flank pain.   PAST MEDICAL HISTORY:  Significant for no drug allergies.   CURRENT MEDICATIONS:  Include Protonix, Tylenol, Phenergan p.r.n., B&O  suppositories.  She is receiving Fentanyl for pain.  Her medications prior  to admission also included:  1. Coumadin 6 mg daily.  2. Spironolactone 60 mg twice daily.  3. Verapamil 30 mg daily.  4. Ivax 240 mg daily.  5. Hydrochlorothiazide 25 mg  daily.  6. Darvocet p.r.n.   MEDICAL HISTORY:  Significant for:  1. Hypertension.  2. History of deep venous thrombosis with pulmonary embolus.  3. She had a right thigh filter placed in May 2004.  4. 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  5. She had a tubal ligation in 1981, following which she had two episodes of     pulmonary embolus.   FAMILY HISTORY:  Remarkable for a brother and sister who died of cancer.  The brother had lymphoma.  A brother and sister who died secondary to  strokes, but her parents died of heart attacks.  Her father had diabetes and  she has a sister with diabetes.   SOCIAL HISTORY:  Negative for tobacco and alcohol.  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   REVIEW OF SYMPTOMS:  She reports some mild residual shortness of breath.  She denies chest pain.  She had some nausea and vomiting over the weekend,  but none in the last several days.  She denies any problems with  constipation and has regular bowel movements.  She is currently voiding with  some frequency, but is otherwise without  complaints of dysuria, urgency or  hematuria.  She reports that when she was having the hematuria she felt like  she was having some vaginal bleeding with that the bleeding was so heavy.  She has pain in the right lower quadrant, but denies flank pain.  She denies  fever or chills.  She denies any lower extremity edema.  She does have some  pain in her right hip from her prior surgery.  She is otherwise entirely  without complaints.   PHYSICAL EXAMINATION:  BLOOD PRESSURE:  115/55  HEART RATE:  85  RESPIRATIONS:  20  TEMPERATURE:  100.1  GENERAL:  She is a well-developed, obese, black female in no acute distress.  She is alert and oriented times three.  HEENT:  Normocephalic, atraumatic.  NECK:  Supple with recent IV puncture site on the right.  LUNGS:  Bilateral breath sounds with normal effort.  HEART:  Regular rate and rhythm.  ABDOMEN:  Soft, obese with right lower quadrant tenderness.  There is no  right upper quadrant tenderness or right CVA tenderness.  No mass,  hepatosplenomegaly or hernias are noted.  There is no inguinal adenopathy.  GENITOURINARY/RECTAL:  Not indicated at this time.  EXTREMITIES:  Full range of motion without edema.  NEUROLOGIC:  She is grossly intact.  SKIN:  Warm and dry.  RECTAL:  Reported by the admitting physician.  She was noted to have some  small external hemorrhoids, good rectal tone and no masses.   ACCESSORY CLINICAL INFORMATION:  Hemoglobin is 7.2 with a white count of 10,  PTT is 48, PT is 14.8, all from today.  Urinalysis from January 28 was  clear.  BMET from September 09, 2003 was significant for a sodium of 129,  potassium of 4.2, chloride 101, CO2 22, glucose 120, BUN 26, creatinine 1.7,  calcium 8.6.  BUN on September 07, 2003 was 23 with a creatinine of 2.1.  BUN  on admission was 15 with a creatinine of 1.1.   I reviewed the CT's with Dr. Fredia Sorrow including those from February 1, February 5 and September 12, 2003 and the hematoma is  present on all three.  The hydronephrosis is present on all three.   IMPRESSION:  1. Right hydronephrosis,  secondary to a large right pelvic hematoma     following attempted vena cava filter placement in an anticoagulated     patient.  I do not believe that she has ureteral calculi.  2. Gross hematuria that was temporally related to her attempted bilateral     femoral access with resulting pelvic hematoma.  The patient's urine on     January 28 was clear.  There are no stents in the kidneys.  I believe the     hematuria was related to the pelvic hematoma as well.  3. History of pulmonary embolus with deep venous thrombosis.  4. Obesity.   RECOMMENDATIONS:  1. At this time I do not believe active intervention for the hydronephrosis     is indicated.  I would suggest that she be watched closely and have     repeat scanning in approximately one week to reassess the hydronephrosis     and hematoma.  2. If the patient develops flank pain or particularly if the flank pain is     associated with a fever, then cystoscopy, retrograde pyelography and     possibly ureteral stenting may be required.  3. A urinalysis was ordered today, medical service will review those     results.  4. I will follow with you.                                               Excell Seltzer. Annabell Howells, M.D.    JJW/MEDQ  D:  09/12/2003  T:  09/13/2003  Job:  [id number]   cc:   Charlaine Dalton. Sherene Sires, M.D. [facility]   Franklyn Lor, MD  Fax: [phone]

## 2010-12-21 NOTE — Discharge Summary (Signed)
NAMECASHAE, WEICH               ACCOUNT NO.:  [id number]   MEDICAL RECORD NO.:  [id number]          PATIENT TYPE:  INP   LOCATION:  5040                         FACILITY:  [facility]   PHYSICIAN:  Feliberto Gottron. Turner Daniels, M.D.   DATE OF BIRTH:  [birth year]   DATE OF ADMISSION:  02/03/2009  DATE OF DISCHARGE:  02/07/2009                               DISCHARGE SUMMARY   CHIEF COMPLAINT:  Left hip pain.   HISTORY OF PRESENT ILLNESS:  This is a 65 year old lady who complains of  severe unremitting pain in her left hip despite conservative treatment.  She has a history of a successful right total hip arthroplasty and now  desires surgical intervention on the contralateral side.  All risks and  benefits of surgery were discussed with the patient.   PAST MEDICAL HISTORY:  Significant for multiple pulmonary emboli and  hypertension.   PAST SURGICAL HISTORY:  She had a right total hip arthroplasty, left  shoulder arthroscopy, and tubal ligation.   SOCIAL HISTORY:  She denies use of tobacco or alcohol.   FAMILY HISTORY:  Positive for coronary artery disease, diabetes, and  hypertension.   ALLERGIES:  She has no known drug allergies.   CURRENT MEDICATIONS:  1. Coumadin 7.5 mg 1 p.o. daily.  2. Verapamil HCL 240 mg 1 p.o. q.h.s.  3. Furosemide  20 mg 1 p.o. daily.  4. HCTZ 25 mg 1 p.o. daily.  5. Spironolactone 25 mg 1 p.o. daily.  6. Ferrous sulfate 325 mg 1 p.o. b.i.d.  7. Lipitor 40 mg 1 p.o. daily.  8. Toprol 50 mg 1 p.o. daily.   PHYSICAL EXAM:  Gross examination of the left hip demonstrates the  patient to have pain with internal rotation and a negative foot tap.  She is neurovascularly intact with intact skin.  X-rays demonstrate bone-  on-bone degenerative changes in the left hip.   PREOP LABS:  White blood cells 6.7, red blood cells 4.18, hemoglobin  12.6, hematocrit 37.7, platelets 270.  PT 27.8, INR 2.4, PTT 34.  Sodium  138, potassium 4.4, chloride 103, glucose 87, BUN 31,  creatinine 1.41.  Urinalysis demonstrates moderate epithelial cells and few bacteria,  otherwise within normal limit.   HOSPITAL COURSE:  Ms. Garretson was admitted to [facility] on February 03, 2009 when she underwent left total hip arthroplasty.  The procedure was  performed by Dr. Gean Birchwood and the patient tolerated it well.  A  perioperative Foley catheter was placed.  She resumed her home Coumadin  therapy and was transferred to the floor in good condition.  On the  first postoperative day, the patient reported minimal pain in her hip  and was tolerating p.o. intake well.  She was afebrile.  Her surgical  drain was removed.  Hemoglobin was 8.7 and she was evaluated by Physical  Therapy.  On the second postoperative day, the patient's Foley catheter  was removed.  She complained of fatigue.  Hemoglobin was 7.9.  Surgical  dressing was clean and dry.  She was transfused with 2 units of packed  red blood cells.  She ambulated 15 feet with physical therapy.  On the  third postoperative day, the patient reported improvement in her fatigue  and stated that her hip pain was well controlled with oral medications.  Surgical dressing remained clean.  Hemoglobin had increased to 9.5 after  transfusion on the previous day.  She ambulated 235 feet with physical  therapy.  On the fourth postoperative day, the patient was awake, alert,  eating well, and ambulating independently off the assistance of a  walker.  Hemoglobin was 9.2.  Surgical dressing was partially soaked.  It was changed and her incision was found to be benign, and she was  discharged home.   DISPOSITION:  The patient was discharged home on February 07, 2009.  She was  weightbearing as tolerated and will return to the clinic in 7 to 10 days  for x-rays and staple removal.  Discharge medicines were as per the HMR  with the addition of Percocet 5 mg 1 to 2 tablets p.o. q. 4h. p.r.n.  pain.  Home Health would manage her wound, Coumadin, and  physical  therapy.   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      Shirl Harris, PA      Feliberto Gottron. Turner Daniels, M.D.  Electronically Signed    JW/MEDQ  D:  03/06/2009  T:  03/06/2009  Job:  [id number]

## 2010-12-21 NOTE — Discharge Summary (Signed)
[name], [name]                         ACCOUNT NO.:  [id number]   MEDICAL RECORD NO.:  [id number]                   PATIENT TYPE:  INP   LOCATION:  3029                                 FACILITY:  [facility]   PHYSICIAN:  Leighton Roach McDiarmid, M.D.             DATE OF BIRTH:  10-18-1945   DATE OF ADMISSION:  09/01/2003  DATE OF DISCHARGE:  09/29/2003                                 DISCHARGE SUMMARY   DISCHARGE DIAGNOSES:  1. Massive pulmonary embolism.  2. Deep venous thrombosis of the left leg.  3. Hypertension.  4. Hydronephrosis secondary to retroperitoneal hematoma, resolved.  5. Anemia.  6. Renal insufficiency.  7. Urinary tract infection, resolved.  8. Obesity.  9. 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   DISCHARGE MEDICATIONS:  1. Coumadin 7.5 mg on Monday/Wednesday/Friday and 10 mg on     Tuesday/Thursday/Saturday/Sunday.  2. HCTZ 25 mg p.o. daily.  3. Spironolactone 25 mg p.o. b.i.d.  4. Verapamil SR 240 mg p.o. daily.  5. Ferrous sulfate 325 mg p.o. b.i.d.   DISCHARGE INSTRUCTIONS:  1. Diet; low fat, low salt diet.  Resource Peach liquid 240 mL t.i.d.  2. Activity as tolerated.  3. Use thigh-high TED hose.  4. [facility] to see the patient at home.  5. Home physical therapy three times a week.   FOLLOW UP:  With Lawerance Sabal, M.D. at the [facility] on March  14, at 3 p.m.  Follow up at [facility] on February 28, at 11  a.m.   CONSULTATIONS:  1. Shan Levans, M.D. [facility]  2. Excell Seltzer. Annabell Howells, M.D., urology.   PROCEDURE:  IVC venogram with infrarenal IVC filter insertion.   HISTORY OF PRESENT ILLNESS:  This is a 65 year old African-American female  who presented with dyspnea and syncopal episodes with substernal chest pain.  The patient reports that she got out of the shower feeling faint.  She  passed out on her bed and believed she was passed out for about an hour. She  was thus brought to the  [facility] ED.  Please refer to the history  and physical for further details.   PHYSICAL EXAMINATION:  VITAL SIGNS:  Blood pressure 116 to 123 over 68 to 77  with a pulse rate of 88 to 97, respiratory rate 20 to 24, temperature 97.8  with O2 saturations at 97 to 99% on 2 liters of oxygen by nasal cannula.  GENERAL:  The patient was alert, oriented x3, and in no acute distress.  NECK:  No JVD.  HEART:  Regular rate and rhythm. Normal S1 and S2 with no murmurs.  LUNGS:  Clear to auscultation bilaterally with decreased breath sounds at  the right apex.  No crackles, no wheezes, and no rhonchi.  The rest of the physical examination was essentially normal.   LABORATORY DATA:  Admission hemoglobin 13.4, hematocrit 40.8, WBC 10.6,  platelets 226.  Sodium 136, potassium 4.1, chloride 104, CO2 27.3, BUN 18,  creatinine 1, glucose 131, D-dimer more than 20.  PT 16.1, INR 1.4.   Chest x-ray showed no abnormality detected.  12-lead EKG showed anterior  fascicular block and ischemia.   CT of the chest showed massive pulmonary embolism including saddle embolus  at the main pulmonary arterial bifurcation.  CT of the lower extremities  showed left popliteal deep venous thrombosis.   HOSPITAL COURSE:  Problem 1.  Massive pulmonary embolism and DVT.  Workup  was done for hypercoagulable state.  A referral to CVTS was done for  possible placement of a [place] Filter.  The patient was started on  heparin drip and Coumadin on the second hospital day.  An attempted access  of the left and right femoral vein was unsuccessful.  There was noted  infrarenal IVC occlusion.  An IVC venogram with infrarenal IVC filter  insertion was successfully placed on September 07, 2003 with findings of a  patent IVC.  Heparin was restarted after the procedure, however, was  temporarily discontinued because of a drop in the hemoglobin and evidence of  retroperitoneal hematoma by CT scan.  Heparin was then  restarted and when  the bleeding issues resolved.  The patient was maintained on a therapeutic  level of heparin.      Lawerance Sabal, MD                         Leighton Roach McDiarmid, M.D.    MC/MEDQ  D:  10/18/2003  T:  10/21/2003  Job:  [id number]   cc:   Shan Levans, M.D. [facility]   [facility]   John J. Annabell Howells, M.D.  [address]  [place]  [place] [address]  Fax: [phone]

## 2010-12-21 NOTE — H&P (Signed)
NAME:  [name], [name]                         ACCOUNT NO.:  [id number]   MEDICAL RECORD NO.:  [id number]                   PATIENT TYPE:  INP   LOCATION:  3305                                 FACILITY:  [facility]   PHYSICIAN:  Franklyn Lor, MD                      DATE OF BIRTH:  06-Jan-1946   DATE OF ADMISSION:  09/01/2003  DATE OF DISCHARGE:                                HISTORY & PHYSICAL   CHIEF COMPLAINT:  Dyspnea and syncope.   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  She believes she was unconscious for  approximately one hour.  Upon awakening she remained presyncopal and  experienced shortness of air and substernal chest pain.  She reports  experiencing two additional syncopal episodes varying in duration, none  longer than the first before calling her family who brought her to the  emergency room.  She admitted at that time that she had pain in the left  lower extremity behind her knee for approximately three or four days.  She  also reported having cold sweats three to four days prior to admission.   PAST MEDICAL HISTORY:  1. Hypertension.  2. The patient is status post tubal ligation in 1981, after which time she     had pulmonary embolus x2 within six weeks in the postoperative period.  3. Right femoral vein ligation in May 2004.  4. Pulmonary embolus in 1997.  5. Right total hip arthroplasty in September 2004, secondary to degenerative     joint disease.   FAMILY HISTORY:  The patient had seven siblings;  four of whom are deceased.  She had one brother and one sister who died of cancer of different types.  Brother was lymphoma, sister is unknown.  Also had one brother and one  sister each of whom died secondary to CVA.  Both her parents died of heart  attacks.  Her father had diabetes, and she has one living sister who has  diabetes.   SOCIAL HISTORY:  The patient used to be a school bus driver, out of work for  approximately six months secondary to right hip pain and right hip surgery.  She does not smoke.   MEDICATIONS:  1. Verapamil SR 240 one p.o. daily.  2. Coumadin 6 mg p.o. daily, although this has been changed in recent     history on a number of occasions.  3. Spironolactone 25 mg p.o. b.i.d.  4. HCTZ 25 mg p.o. q.a.m.   ALLERGIES:  No known drug allergies.   REVIEW OF SYSTEMS:  As in HPI.   PHYSICAL EXAMINATION:  VITAL SIGNS:  Temperature 97.8, pulse 88 to 97,  respirations  20 to 24, blood pressure 115 to 123 systolic/68 to 77  diastolic, O2 saturation is 97 to 99% on 2 L by nasal cannula.  GENERAL:  This is a well-appearing 65 African-American female with  the head of the bed at 30 degrees who is tearful, but otherwise in no  apparent distress.  She is alert and oriented x3.  HEENT:  TMs are clear bilaterally.  Oropharynx clear.  Mucous membranes are  moist.  PERRL, EOMI.  NECK:  No carotid bruits bilaterally, no JVD.  CHEST:  Regular rate and rhythm, no murmurs, rubs, or gallops.  Normal S1  and S2.  LUNGS:  Clear to auscultation bilaterally with questionable decreased breath  sounds in the right apex.  No crackles, wheezes, or rhonchi.  ABDOMEN:  Soft, nontender, obese, positive bowel sounds.  EXTREMITIES:  5/5 strength throughout.  Negative Homans sign, 1+ distal  dorsalis pedis and posterior tibial pulses bilaterally with brisk capillary  refill.  Lower extremities are obese, but there is no asymmetric swelling  noted.  The patient does have a positive cord in the left popliteal fossa.  She also has a well-healed approximately 14 inch scar of her right hip.  NEUROLOGIC:  Cranial nerves II-XII intact.  Peripheral sensation intact,  although somewhat diminished.  RECTAL:  Several small non-thrombosed  external hemorrhoids.  Good rectal  tone.  No masses.   LABORATORY DATA:  White count 10.6, hemoglobin 13.4, hematocrit 40.8,  platelets 226.  Absolute neutrophil count 9.1, MCV 82.8, RDW 18%.  PT 16.1,  INR 1.4.  D-dimer greater than 20.  Sodium 136, potassium 4.1, chloride 104,  bicarbonate 27.3, BUN 18, creatinine 1, glucose 131.  Cardiac enzymes, first  set:  CK-MB 27, myoglobin 99.6, troponin-I 0.11.  Second set:  CK-MB 2.8,  myoglobin 92.7, troponin-I 0.11.  Third set:  CK-MB 2.4, myoglobin 77.1,  troponin-I 0.19.  The patient was fecal occult blood negative.  She had a  chest x-ray that showed no acute disease.  A CT scan of the chest showed  massive pulmonary embolus, including saddle embolus and main pulmonary  artery bifurcation.  Also had a CT scan of the lower extremity which showed  a left popliteal DVT.  A 12-lead EKG showed questionable anterior fascicular  anterior block and/or ischemia which represents a positive EKG change from  September 2004.   ASSESSMENT AND PLAN:  This is a 65 year old African-American female with a  strong history of deep vein thrombosis and pulmonary embolism who now  presents with a large saddle pulmonary embolism and left lower extremity  deep vein thrombosis.  1. Pulmonary embolism/deep vein thrombosis.  The patient has a strong     personal and family history of thromboembolic disease.  Records that were     available to [place] in the emergency department failed to reveal coagulopathy     workup in the past despite documented pulmonary embolism and deep vein     thrombosis x3 in the past.  She does have a deep vein thrombosis and     pulmonary embolism by CT scan on this admission.  Differential diagnoses     includes hereditary clotting factor deficiency which is entirely possible     given the patient's family history.  Check antithrombin 3, factor 5     leiden, protein C, free protein S, anticardiolipin antibody, also assess    for lupus.   Also in differential is malignancy associated thrombosis.     The patient has never  had a colonoscopy.  Has not had a mammogram nor a     Pap smear in greater than two years.  These are all procedures that can     be performed on an outpatient basis if the patient's coagulopathy workup     is negative, otherwise, it may be pursued on an outpatient basis.  We     will check erythrocyte sedimentation rate.  I spoke with Dr. Dietrich Pates of     [facility] and described the patient's situation.  He did not     feel the patient's presentation was worthy of evaluation at that time.  I     spoke with Dr. Darrick Penna of cardiovascular and thoracic surgery, and we felt     that the patient's vital signs were stable and that she was asymptomatic     clinically, he felt neither thrombolytic, ____________, or immediate     surgical intervention were necessary.  He agreed to see the patient in     the morning to evaluate her for placement of a filter.  One of the     primary goals of this admission will be to tightly regulate the patient's     anticoagulation to achieve a therapeutic INR, as this patient has     continued to demonstrate thrombosis in the face of anticoagulation.     Pharmacy was very prompt and saw the patient in the emergency department     and gave a partial heparin bolus of 2500 units, and also wrote to     maintain a heparin drip that they will follow.  The patient was     maintained on O2 by nasal cannula, and written to be admitted for a step-     down bed for continuous pulse ox and cardiorespiratory monitoring.  2. Cardiology.  Increased troponin-I and positive EKG changes.  Could be due     to a strain secondary to pulmonary embolism, but we will check her     cardiac enzymes x2, in addition to the cardiac enzymes drawn in the     emergency department, and repeat EKG in the a.m.  Morphine sulfate IV for     pain.  O2 by nasal cannula.     Maintain the patient's home  antihypertensive medications.  3. Disposition.  This patient will need extremely close followup for INR and     more consistent preventable health care regarding colonoscopy and     mammogram.                                                Franklyn Lor, MD    TD/MEDQ  D:  09/02/2003  T:  09/03/2003  Job:  [id number]

## 2010-12-21 NOTE — Op Note (Signed)
NAME:  [name], BACHTELL                         ACCOUNT NO.:  [id number]   MEDICAL RECORD NO.:  [id number]                   PATIENT TYPE:  INP   LOCATION:  5037                                 FACILITY:  [facility]   PHYSICIAN:  Feliberto Gottron. Turner Daniels, M.D.                DATE OF BIRTH:  1945-12-05   DATE OF PROCEDURE:  DATE OF DISCHARGE:                                 OPERATIVE REPORT   PREOPERATIVE DIAGNOSIS:  End-stage arthritis of the right hip.   POSTOPERATIVE DIAGNOSIS:  End-stage arthritis of the right hip.   PROCEDURE:  Right total hip arthroplasty using Dupuy/SROM components.  A 54  mm Pinnacle cup, a 36 metal liner, 20 x 15 x 42 x 165 femoral stem, 20D  small cone, NK+0 36 mm metal ball.   SURGEON:  Feliberto Gottron. Turner Daniels, M.D.   ASSISTANTLaural Benes. Jannet Mantis.   ANESTHESIA:  General endotracheal.   ESTIMATED BLOOD LOSS:  500 ml.   FLUIDS REPLACED:  2 liters of crystalloid.   URINE OUTPUT:  About 100 ml.   DRAINS:  Foley catheter and two medium Hemovac's.   INDICATIONS FOR PROCEDURE:  The patient is a 65 year old woman with multiple  medical problems including pulmonary emboli x3, each time she has been taken  off of Coumadin in the past for surgery.  Because of this, we left her on  Coumadin and kept her INR just at the 2.0 level in hopes of preventing  further problems.  She has horrible end-stage osteoarthritis with bone-on-  bone grinding and is absolutely miserable with pain and accepts the  significant risk she would incur with any elective surgery in exchange for  pain relief.  She is well aware that this operation because of it's nature  may harm her or even kill her, but she will take that risk in order to  escape the pain that has been present now for many years.  Plain x-rays show  bone-on-bone arthritis with collapse of the femoral head. She is approaching  the point where she is no longer ambulatory and simply does not want to live  this way anymore.   DESCRIPTION OF PROCEDURE:  The patient was identified by armband and taken  to the operating room at [facility]. Appropriate anesthetic monitors  were attached and general endotracheal anesthesia induced with the patient  in the supine position.  She was then rolled into the left lateral decubitus  position, fixed over the stool berg mark to pelvic clamp, keeping her pelvis  in the vertical.  The right lower extremity was then prepped and draped in  the usual sterile fashion from the ankle to the hemipelvis and the skin  along the lateral hip and thigh infiltrated with 10 to 20 ml with 0.5%  Marcaine and epinephrine solution.  A 30 cm incision centered over the  greater trochanter was made through the skin and  subcutaneous tissue down to  the level of the IT band.  She is 5 feet 2 inches tall and weighs about 380  pounds and the fat pad was relatively generous in this area.  We incised the  IT band along the skin incision exposing the greater trochanter, gluteus  muscles, short external rotators, and the quadratus.  A Cobra retractor was  placed between the superior hip joint capsule and the gluteus minimus and  between the inferior hip joint capsule and the quadratus femoris.  The short  external rotators and piriformis were then tagged with one #2 Ethibond  suture and cut off their insertion on the intertrochanteric crest.  We then  developed an acetabular based capsular flap going from posterior superior to  posterior inferior and tagged this #2 Ethibond sutures and retracted it  posteriorly protecting the sciatic nerve.  This exposed the arthritic  femoral head which was down to bear bone and collapsing focally in a number  of areas.  The hip was then flexed and internally rotated dislocating the  femoral head which was then resected one fingerbreadth above the lesser  trochanter with a power saw.  Posterior superior and posterior inferior wing  retractors were then placed  enhancing the acetabular exposure.  The proximal  femur was bevelled anteriorly using a Homan off the anterior column and a  spiked Cobra was placed in the cotyloid notch. This allowed use to remove  numerous joint mice or free-floating osteochondral loose bodies as well as  hypertrophic labrum anterior and superior and completing our exposure of the  acetabulum.  We then sequentially reamed up to a 53 mm basket reamer which  was pretty much what we Templated, obtaining good bony coverage in all four  quadrants.  We then hammered into place a 54 mm Pinnacle cup with an  excellent press fit and did not require any supplemental fixation.  A trial  0 degree liner was then screwed into place and the hip was flexed and  internally rotated exposing the proximal femur. Using the SROM cookie  cutter, we entered the proximal femur followed by the SROM drill initiator  and then sequentially reamed up to a 15 mm cylindrical reamer to the full  depth and a 15.5 mm to 3/4 depth.  We then conically reamed up to a 15D cone  obtaining good cortical chatter anteriorly and because of the patient's  obesity we were unable to mill for the calcar spout, but we were able to use  curets and set up for a 20D small trial cone which was hammered into place  without difficulty. This was followed by a trial stem with a 42 base neck  set at the same version of the neck itself which was about 20 degrees with  an NK+0 trial ball. The hip was reassembled, could flex to 90 with 60 of  internal rotation and could not be dislocated anteriorly in external  rotation.  Satisfied with the position of the trials, these were removed and  a Metasul liner was then hammered into the metal shell after cleaning it  with lavage and occluding the hole with an apex hole eliminator.  We then  inserted the 22D small ZTT1 cone from SROM followed by a 15 x 42 x 165 stem  which was hammered in the same version as the cone and an NK+0 36 mm  ball was then hammered onto the stem. The hip was once again relocated and  excellent stability noted.  At this point, the wound was thoroughly  irrigated with hand lavage. The short external rotators and capsule were  repaired back to the intertrochanteric crest with drill holes using a #2  Ethibond suture. A deep Hemovac drain was placed into the acetabulum and a  superficial drain into the subcutaneous layer.  The IT band was closed with  running #1 Vicryl suture, the subcutaneous tissue with 0 and 2-0 undyed  Vicryl suture in layers, and the skin with running interlocking 3-0 nylon  suture. A dressing of Xeroform, 4x4 dressing, sponges, and Hypafix tape  applied.  The patient was rolled supine, awakened, and taken to the recovery  room without difficulty.                                               Feliberto Gottron. Turner Daniels, M.D.    Ovid Curd  D:  04/18/2003  T:  04/18/2003  Job:  [id number]

## 2010-12-21 NOTE — Consult Note (Signed)
NAME:  DEBRIA, BROECKER                         ACCOUNT NO.:  [id number]   MEDICAL RECORD NO.:  [id number]                   PATIENT TYPE:  INP   LOCATION:  3310                                 FACILITY:  [facility]   PHYSICIAN:  Excell Seltzer. Annabell Howells, M.D.                 DATE OF BIRTH:  1945/09/09   DATE OF CONSULTATION:  DATE OF DISCHARGE:                                   CONSULTATION   ADDENDUM:   IMPRESSION:  Mild renal insufficiency with normal admission BUN and  creatinine. This is likely due to the right ureteral obstruction, possibly  with some contrast effect.   RECOMMENDATIONS:  I would follow her creatinine closely. If this does not  continue returning toward normal, she may require placement of a stent.                                               Excell Seltzer. Annabell Howells, M.D.    JJW/MEDQ  D:  09/12/2003  T:  09/13/2003  Job:  [id number]   cc:   Charlaine Dalton. Sherene Sires, M.D. [facility]   Franklyn Lor, MD  Fax: [phone]

## 2010-12-21 NOTE — Discharge Summary (Signed)
NAME:  [name], [name]                         ACCOUNT NO.:  [id number]   MEDICAL RECORD NO.:  [id number]                   PATIENT TYPE:  INP   LOCATION:  5037                                 FACILITY:  [facility]   PHYSICIAN:  Feliberto Gottron. Turner Daniels, M.D.                DATE OF BIRTH:  March 28, 1946   DATE OF ADMISSION:  04/18/2003  DATE OF DISCHARGE:  04/22/2003                                 DISCHARGE SUMMARY   HISTORY OF PRESENT ILLNESS:  The patient is a 65 year old woman with  osteoarthritis of right greater than left hip with bone on bone arthritic  changes on the right side and less so on the left side.  She has failed  conservative treatment, antiinflammatory medications, use of narcotics and  has a great deal of difficulty getting around.  She is 5 feet 5 inches and  she probably weighs about 300 pounds and is almost wheelchair bound.  She  wishes to proceed with right total hip arthroplasty to preserve function and  decreased pain.   ALLERGIES:  No known drug allergies.   MEDICATIONS:  1. Hydrochlorothiazide.  2. Spironolactone.  3. Vicodin.  4. Coumadin.  5. Covera-HS.  6. Flexeril.  7. Triamcinolone.   PAST MEDICAL HISTORY:  Pulmonary emboli x3.  Each time she has had surgery  in the past, they take her off her Coumadin.  She has a pulmonary embolus,  therefore we are planning on leaving her on her Coumadin for her total hip  arthroplasty and watch her closely for any bleeding problems.   PAST SURGICAL HISTORY:  1. Tubal ligation in 1981.  2. Ligation of the right femoral vein in May 2004.   SOCIAL HISTORY:  She does not smoke or drink.  She is widowed and lives with  her 1 year old daughter and has multiple family members here in town.  Mother died at 3 of a heart attack.  Father died at 72 of a heart attack.   REVIEW OF SYMPTOMS:  HEENT:  She does wear glasses and has a heart murmur.  She has some loosening of her front teeth.  RESPIRATORY:  Denies any  shortness of  breath or chest pain.   PHYSICAL EXAMINATION:  VITAL SIGNS:  At the time of admission, her pulse was  70, respirations 18, blood pressure 130/86.  HEART:  She had a very faint heart murmur.  Regular rate and rhythm.  LUNGS:  Clear.  ABDOMEN:  Soft, nontender.  EXTREMITIES:  Right hip had almost no internal rotation at all.  External  rotation was limited to five degrees.  Foot tap was positive for pain on any  attempts at flexion and internal rotation.  Severe right hip pain, less so  on the left side.   HOSPITAL COURSE:  The patient was admitted to the hospital on the day of  admission and underwent right total arthroplasty using a DePuy/SROM total  hip construct.  We used a 54 mm pennicle acetabular cup, 36 metal liner, 20  x 15 x 42 SROM stem, 20D small cone and a 36 mm NK plus 0 femoral head.  Postoperatively, she did very well.  Her dressing remained clean and dry.  She remained afebrile.  We kept her on her Coumadin and her protime never  really dropped below 20.  She was able to ambulate up to 40 feet on the  third day after surgery, but because of her body habitus and because her  home situation required climbing 14 stairs, we did consult the  rehabilitation specialist for consideration of SACU versus rehabilitation  and they have agreed to look for a bed for her.  She has not required any  transfusions.  She is able to void on her own.  Her IV was removed on  April 22, 2003.  She is eating well and having bowel movements.   CONDITION ON DISCHARGE:  She is doing well after right total hip  arthroplasty.   DISPOSITION:  She is discharged to [facility] [place], rehabilitation or possibly home  depending on the results of her physical therapy.   MEDICATIONS:  1. Hydrochlorothiazide 25 mg a day.  2. Spironolactone 25 mb b.i.d.  3. Vicodin one to two p.o. q.4-6h. p.r.n.  4. Coumadin 6 mg p.o. q.p.m.  5. Covera 240 mg p.o. q.h.s.  6. Flexeril 10 mg p.o. q.h.s.  7. Triamcinolone cream  on a t.i.d. basis.   FOLLOW UP:  She will return to see me in the office in about a week for  suture removal and she will remain on her Coumadin indefinitely because of  her history of PE in the past.   ACTIVITY:  She is weightbearing as tolerated with or without crutches or a  walker depending on her rehabilitation potential.                                                Feliberto Gottron. Turner Daniels, M.D.    Ovid Curd  D:  04/22/2003  T:  04/22/2003  Job:  [id number]

## 2011-01-08 ENCOUNTER — Other Ambulatory Visit: Payer: Self-pay | Admitting: Family Medicine

## 2011-01-08 NOTE — Telephone Encounter (Signed)
Refill request

## 2011-01-25 ENCOUNTER — Ambulatory Visit (INDEPENDENT_AMBULATORY_CARE_PROVIDER_SITE_OTHER): Payer: Medicare Other | Admitting: *Deleted

## 2011-01-25 DIAGNOSIS — Z7901 Long term (current) use of anticoagulants: Secondary | ICD-10-CM

## 2011-01-25 DIAGNOSIS — I2699 Other pulmonary embolism without acute cor pulmonale: Secondary | ICD-10-CM

## 2011-01-25 LAB — POCT INR: INR: 2.8

## 2011-02-11 ENCOUNTER — Ambulatory Visit (INDEPENDENT_AMBULATORY_CARE_PROVIDER_SITE_OTHER): Payer: Medicare Other | Admitting: *Deleted

## 2011-02-11 DIAGNOSIS — Z7901 Long term (current) use of anticoagulants: Secondary | ICD-10-CM

## 2011-02-11 DIAGNOSIS — I2699 Other pulmonary embolism without acute cor pulmonale: Secondary | ICD-10-CM

## 2011-02-11 LAB — POCT INR: INR: 2.2

## 2011-02-13 ENCOUNTER — Ambulatory Visit (INDEPENDENT_AMBULATORY_CARE_PROVIDER_SITE_OTHER): Payer: Medicare Other | Admitting: Family Medicine

## 2011-02-13 ENCOUNTER — Encounter: Payer: Self-pay | Admitting: Family Medicine

## 2011-02-13 VITALS — BP 157/79 | HR 75 | Temp 98.1°F | Ht 65.0 in | Wt 273.2 lb

## 2011-02-13 DIAGNOSIS — M25569 Pain in unspecified knee: Secondary | ICD-10-CM

## 2011-02-13 DIAGNOSIS — M25561 Pain in right knee: Secondary | ICD-10-CM

## 2011-02-13 NOTE — Progress Notes (Signed)
  Subjective:    Patient ID: [name], female    DOB: 1946/02/11, 65 y.o.   MRN: [id number]  HPI Pt here requesting injection of steroid into right knee.  Has severe arthritis in right knee.  Told by orthopedist that she would need knee replacement.  States she is not ready for this.  Pt state she would like another injection.  She was told by Dr. Karie Schwalbe that she could have these every 3 months.  The last injection significantly improved her pain.     Review of Systems No fever.  No dizziness.  No joint trauma. No joint redness.    Objective:   Physical Exam  Constitutional:       obese  Musculoskeletal:       Pain on palpation of knees bilateral along tibial plateau - particularly on palpation of lateral aspects of knees.  + mild joint effusion.  No joint edema.  No joint redness.  No joint laxity.   Skin: No rash noted.    Right knee injection: Consent obtained and verified.  Sterile betadine prep. Furthur cleansed with alcohol.  Topical analgesic spray: Ethyl chloride.  Joint: R knee  Approached in typical fashion with: 25g needle in anterolateral approach just lateral to patellar tendon at inferior pole of patella.  Completed without difficulty  Meds: 3cc lidocaine, 3cc marcaine, 1cc kenalog 40  Aftercare instructions and Red flags advised.    Assessment & Plan:

## 2011-02-13 NOTE — Assessment & Plan Note (Signed)
Right knee injected without complication.  Pt given red flags for return.  Pt to return as needed for f/up.  Pt to return for annual exam.

## 2011-02-18 ENCOUNTER — Ambulatory Visit (INDEPENDENT_AMBULATORY_CARE_PROVIDER_SITE_OTHER): Payer: Medicare Other | Admitting: *Deleted

## 2011-02-18 DIAGNOSIS — Z7901 Long term (current) use of anticoagulants: Secondary | ICD-10-CM

## 2011-02-18 DIAGNOSIS — I2699 Other pulmonary embolism without acute cor pulmonale: Secondary | ICD-10-CM

## 2011-02-18 LAB — POCT INR: INR: 3

## 2011-02-19 ENCOUNTER — Other Ambulatory Visit: Payer: Self-pay | Admitting: Family Medicine

## 2011-03-11 ENCOUNTER — Ambulatory Visit (INDEPENDENT_AMBULATORY_CARE_PROVIDER_SITE_OTHER): Payer: Medicare Other | Admitting: *Deleted

## 2011-03-11 DIAGNOSIS — I2699 Other pulmonary embolism without acute cor pulmonale: Secondary | ICD-10-CM

## 2011-03-11 DIAGNOSIS — Z7901 Long term (current) use of anticoagulants: Secondary | ICD-10-CM

## 2011-03-11 LAB — POCT INR: INR: 2.2

## 2011-04-09 ENCOUNTER — Ambulatory Visit (INDEPENDENT_AMBULATORY_CARE_PROVIDER_SITE_OTHER): Payer: Medicare Other | Admitting: *Deleted

## 2011-04-09 ENCOUNTER — Ambulatory Visit: Payer: Medicare Other

## 2011-04-09 DIAGNOSIS — I2699 Other pulmonary embolism without acute cor pulmonale: Secondary | ICD-10-CM

## 2011-04-09 DIAGNOSIS — Z7901 Long term (current) use of anticoagulants: Secondary | ICD-10-CM

## 2011-04-09 LAB — POCT INR: INR: 2.6

## 2011-05-07 ENCOUNTER — Ambulatory Visit: Payer: Medicare Other

## 2011-05-10 LAB — PROTIME-INR
INR: 2.2 — ABNORMAL HIGH (ref 0.00–1.49)
INR: 3 — ABNORMAL HIGH (ref 0.00–1.49)
Prothrombin Time: 26.1 seconds — ABNORMAL HIGH (ref 11.6–15.2)
Prothrombin Time: 33.8 seconds — ABNORMAL HIGH (ref 11.6–15.2)

## 2011-05-10 LAB — BASIC METABOLIC PANEL
BUN: 24 mg/dL — ABNORMAL HIGH (ref 6–23)
BUN: 27 mg/dL — ABNORMAL HIGH (ref 6–23)
CO2: 27 mEq/L (ref 19–32)
CO2: 29 mEq/L (ref 19–32)
Calcium: 10.1 mg/dL (ref 8.4–10.5)
Calcium: 10.4 mg/dL (ref 8.4–10.5)
Chloride: 104 mEq/L (ref 96–112)
Chloride: 104 mEq/L (ref 96–112)
Creatinine, Ser: 1.43 mg/dL — ABNORMAL HIGH (ref 0.4–1.2)
Creatinine, Ser: 1.46 mg/dL — ABNORMAL HIGH (ref 0.4–1.2)
GFR calc Af Amer: 44 mL/min — ABNORMAL LOW (ref >60)
GFR calc Af Amer: 45 mL/min — ABNORMAL LOW (ref >60)
GFR calc non Af Amer: 36 mL/min — ABNORMAL LOW (ref >60)
GFR calc non Af Amer: 37 mL/min — ABNORMAL LOW (ref >60)
Glucose, Bld: 115 mg/dL — ABNORMAL HIGH (ref 70–99)
Glucose, Bld: 81 mg/dL (ref 70–99)
Potassium: 4.1 mEq/L (ref 3.5–5.1)
Potassium: 4.6 mEq/L (ref 3.5–5.1)
Sodium: 139 mEq/L (ref 135–145)
Sodium: 140 mEq/L (ref 135–145)

## 2011-05-10 LAB — CBC
HCT: 38.6 % (ref 36.0–46.0)
Hemoglobin: 12.7 g/dL (ref 12.0–15.0)
MCHC: 32.9 g/dL (ref 30.0–36.0)
MCV: 91.7 fL (ref 78.0–100.0)
Platelets: 317 10*3/uL (ref 150–400)
RBC: 4.21 MIL/uL (ref 3.87–5.11)
RDW: 16.3 % — ABNORMAL HIGH (ref 11.5–15.5)
WBC: 9 10*3/uL (ref 4.0–10.5)

## 2011-05-10 LAB — APTT: aPTT: 33 seconds (ref 24–37)

## 2011-06-18 ENCOUNTER — Encounter: Payer: Self-pay | Admitting: Family Medicine

## 2011-06-18 ENCOUNTER — Ambulatory Visit (INDEPENDENT_AMBULATORY_CARE_PROVIDER_SITE_OTHER): Payer: Medicare Other | Admitting: Family Medicine

## 2011-06-18 VITALS — BP 164/95 | HR 79 | Temp 99.2°F | Ht 65.0 in | Wt 275.8 lb

## 2011-06-18 DIAGNOSIS — J00 Acute nasopharyngitis [common cold]: Secondary | ICD-10-CM

## 2011-06-18 MED ORDER — ZOSTER VACCINE LIVE 19400 UNT/0.65ML ~~LOC~~ SOLR: 0.6500 mL | Freq: Once | SUBCUTANEOUS | Status: AC

## 2011-06-18 MED ORDER — BENZONATATE 100 MG PO CAPS: 100.0000 mg | ORAL_CAPSULE | Freq: Three times a day (TID) | ORAL | Status: AC | PRN

## 2011-06-18 NOTE — Patient Instructions (Addendum)
It has been a pleasure to see you today.  An upper respiratory infection (URI) is also sometimes known as the common cold. The upper respiratory tract includes the nose, sinuses, throat, trachea, and bronchi. Bronchi are the airways leading to the lungs. Most people improve within 1 week, but symptoms can last up to 2 weeks. A residual cough may last even longer.  CAUSES Many different viruses can infect the tissues lining the upper respiratory tract. The tissues become irritated and inflamed and often become very moist. Mucus production is also common. A cold is contagious. You can easily spread the virus to others by oral contact. This includes kissing, sharing a glass, coughing, or sneezing. Touching your mouth or nose and then touching a surface, which is then touched by another person, can also spread the virus. SYMPTOMS  Symptoms typically develop 1 to 3 days after you come in contact with a cold virus. Symptoms vary from person to person. They may include:  Runny nose.   Sneezing.   Nasal congestion.   Sinus irritation.   Sore throat.   Loss of voice (laryngitis).   Cough.   Fatigue.   Muscle aches.   Loss of appetite.   Headache.   Low-grade fever.  RISKS AND COMPLICATIONS  You may be at risk for a more severe case of the common cold if you smoke cigarettes, have chronic heart disease (such as heart failure) or lung disease (such as asthma), or if you have a weakened immune system. The very young and very old are also at risk for more serious infections. Bacterial sinusitis, middle ear infections, and bacterial pneumonia can complicate the common cold. The common cold can worsen asthma and chronic obstructive pulmonary disease (COPD). Sometimes, these complications can require emergency medical care and may be life-threatening. PREVENTION  The best way to protect against getting a cold is to practice good hygiene. Avoid oral or hand contact with people with cold symptoms.  Wash your hands often if contact occurs. There is no clear evidence that vitamin C, vitamin E, echinacea, or exercise reduces the chance of developing a cold. However, it is always recommended to get plenty of rest and practice good nutrition. TREATMENT  Treatment is directed at relieving symptoms. There is no cure. Antibiotics are not effective, because the infection is caused by a virus, not by bacteria. Treatment may include:  Increased fluid intake. Sports drinks offer valuable electrolytes, sugars, and fluids.   Breathing heated mist or steam (vaporizer or shower).   Eating chicken soup or other clear broths, and maintaining good nutrition.   Getting plenty of rest.   Using gargles or lozenges for comfort. Zinc gel and zinc lozenges, taken in the first 24 hours of the common cold, can shorten the duration and lessen the severity of symptoms. Pain medicines may help with fever, muscle aches, and throat pain. A variety of non-prescription medicines are available to treat congestion and runny nose. Your caregiver can make recommendations and may suggest nasal or lung inhalers for other symptoms.  HOME CARE INSTRUCTIONS   Only take over-the-counter or prescription medicines for pain, discomfort, or fever as directed by your caregiver.   Use a warm mist humidifier or inhale steam from a shower to increase air moisture. This may keep secretions moist and make it easier to breathe.   Drink enough water and fluids to keep your urine clear or pale yellow.   Rest as needed.   Return to work when your temperature has returned  to normal or as your caregiver advises. You may need to stay home longer to avoid infecting others. You can also use a face mask and careful hand washing to prevent spread of the virus.  SEEK MEDICAL CARE IF:   After the first few days, you feel you are getting worse rather than better.   You need your caregiver's advice about medicines to control symptoms.   You  develop chills, worsening shortness of breath, or brown or red sputum. These may be signs of pneumonia.   You develop yellow or brown nasal discharge or pain in the face, especially when you bend forward. These may be signs of sinusitis.   You develop a fever, swollen neck glands, pain with swallowing, or white areas in the back of your throat. These may be signs of strep throat.  SEEK IMMEDIATE MEDICAL CARE IF:   You have a fever.   You develop severe or persistent headache, ear pain, sinus pain, or chest pain.   You develop wheezing, a prolonged cough, cough up blood, or have a change in your usual mucus (if you have chronic lung disease).   You develop sore muscles or a stiff neck.  Document Released: 01/15/2001 Document Revised: 04/03/2011 Document Reviewed: 11/23/2010 [facility] Patient Information 2012 [place], [place].   Make an appointment to get your flu shot. You can take Tessalon capsules 1  twice a day and 2 capsules before bed. Dayquil 1 tablespoon every 6 hours.  Do not take acetaminophen separately as the dayquil contains it.

## 2011-06-18 NOTE — Progress Notes (Signed)
  Subjective:    Patient ID: [name], female    DOB: [birth year], 65 y.o.   MRN: [id number]  HPI Patient with a history of PE on Coumadin comes today complaining of cough for about 4 days. This cough is worse at night and  there is minimal white production with it. This cough and postnasal secretions makes her nauseated and has had 2 vomits since all this started. Patient also complains of mild general malaise. No fevers or chills, no joint or muscle pain. No shortness of breath or chest pain. She has bilateral eye pruritus since yesterday. No other associated symptoms  Review of Systems    Per history of present illness Objective:   Physical Exam  Constitutional: She is oriented to person, place, and time. No distress.  HENT:  Right Ear: External ear normal.  Left Ear: External ear normal.  Nose: Nose normal.  Mouth/Throat: Oropharynx is clear and moist. No oropharyngeal exudate.       Pharyngeal erythema.  Eyes: Conjunctivae and EOM are normal. Pupils are equal, round, and reactive to light. Right eye exhibits no discharge. Left eye exhibits no discharge.  Neck: Neck supple.  Cardiovascular: Normal rate, regular rhythm and normal heart sounds.   No murmur heard. Pulmonary/Chest: Effort normal and breath sounds normal. No respiratory distress. She has no wheezes. She has no rales. She exhibits no tenderness.  Abdominal: Bowel sounds are normal.  Musculoskeletal: She exhibits no edema.  Neurological: She is alert and oriented to person, place, and time.          Assessment & Plan:

## 2011-06-18 NOTE — Assessment & Plan Note (Signed)
Cough and malaise for 4 days, no fever no shortness of breath. Physical exam only with pharyngeal erythema.  Plan: Discussed red flags of worsening of symptoms, appearance of fever or other complications. Symptomatic treatment regulating dose of acetaminophen no more than 1.3 g.a day. Discussed shingles and flu vaccine. Gave prescription for shingles vaccine. To be administered after current symptoms have resolved. Patient at increased risk for taking care of 2 small children on daily basis.

## 2011-08-12 ENCOUNTER — Telehealth: Payer: Self-pay | Admitting: Family Medicine

## 2011-08-12 NOTE — Telephone Encounter (Signed)
Pt is going to be out of her coumidin today - has appt 1/[facility]

## 2011-08-13 ENCOUNTER — Other Ambulatory Visit: Payer: Self-pay | Admitting: Family Medicine

## 2011-08-13 MED ORDER — WARFARIN SODIUM 5 MG PO TABS: 5.0000 mg | ORAL_TABLET | ORAL | Status: DC

## 2011-08-13 NOTE — Telephone Encounter (Signed)
Refill of coumadin sent to get pt through until her appt on 1/10.

## 2011-08-15 ENCOUNTER — Telehealth: Payer: Self-pay | Admitting: Family Medicine

## 2011-08-15 ENCOUNTER — Encounter: Payer: Self-pay | Admitting: Family Medicine

## 2011-08-15 ENCOUNTER — Ambulatory Visit (INDEPENDENT_AMBULATORY_CARE_PROVIDER_SITE_OTHER): Payer: Medicare Other | Admitting: Family Medicine

## 2011-08-15 VITALS — BP 154/81 | HR 84 | Ht 65.0 in | Wt 273.0 lb

## 2011-08-15 DIAGNOSIS — E559 Vitamin D deficiency, unspecified: Secondary | ICD-10-CM

## 2011-08-15 DIAGNOSIS — E669 Obesity, unspecified: Secondary | ICD-10-CM

## 2011-08-15 DIAGNOSIS — Z7901 Long term (current) use of anticoagulants: Secondary | ICD-10-CM

## 2011-08-15 DIAGNOSIS — I10 Essential (primary) hypertension: Secondary | ICD-10-CM

## 2011-08-15 DIAGNOSIS — I2699 Other pulmonary embolism without acute cor pulmonale: Secondary | ICD-10-CM

## 2011-08-15 DIAGNOSIS — D649 Anemia, unspecified: Secondary | ICD-10-CM

## 2011-08-15 DIAGNOSIS — Z23 Encounter for immunization: Secondary | ICD-10-CM

## 2011-08-15 DIAGNOSIS — E785 Hyperlipidemia, unspecified: Secondary | ICD-10-CM

## 2011-08-15 LAB — CBC
HCT: 38.7 % (ref 36.0–46.0)
Hemoglobin: 12.1 g/dL (ref 12.0–15.0)
MCH: 28.7 pg (ref 26.0–34.0)
MCHC: 31.3 g/dL (ref 30.0–36.0)
MCV: 91.7 fL (ref 78.0–100.0)
Platelets: 322 10*3/uL (ref 150–400)
RBC: 4.22 MIL/uL (ref 3.87–5.11)
RDW: 15.4 % (ref 11.5–15.5)
WBC: 7.4 10*3/uL (ref 4.0–10.5)

## 2011-08-15 LAB — COMPREHENSIVE METABOLIC PANEL
ALT: 10 U/L (ref 0–35)
AST: 16 U/L (ref 0–37)
Albumin: 4.6 g/dL (ref 3.5–5.2)
Alkaline Phosphatase: 56 U/L (ref 39–117)
BUN: 23 mg/dL (ref 6–23)
CO2: 26 mEq/L (ref 19–32)
Calcium: 9.4 mg/dL (ref 8.4–10.5)
Chloride: 104 mEq/L (ref 96–112)
Creat: 1.31 mg/dL — ABNORMAL HIGH (ref 0.50–1.10)
Glucose, Bld: 82 mg/dL (ref 70–99)
Potassium: 4 mEq/L (ref 3.5–5.3)
Sodium: 142 mEq/L (ref 135–145)
Total Bilirubin: 0.4 mg/dL (ref 0.3–1.2)
Total Protein: 7.2 g/dL (ref 6.0–8.3)

## 2011-08-15 NOTE — Patient Instructions (Addendum)
Blood pressure: Keep log book of blood pressures and return in 1 month with readings.   Cholesterol: Stop Lipitor-see if reflux symptoms improve. Remember exercise- daily.  Healthy diet. Will recheck lipid panel in march 2013  Return in 1 month for follow up

## 2011-08-15 NOTE — Telephone Encounter (Signed)
[name] is calling because she was in earlier today and she would like an Rx for a Hospital Bed.  The Rx needs to go to [facility], which is where she got her last one.  She can be reach at either home or cell #'s.

## 2011-08-15 NOTE — Progress Notes (Signed)
  Subjective:    Patient ID: [name], female    DOB: 1945-09-24, 66 y.o.   MRN: [id number]  HPI Patient here for medication refill: Adjusting she is out of all medications. States she doesn't take the Vicodin-doesn't like the way it makes her feel. Was taking this for joint pain.  Blood pressure: Elevated today at 154/81. Rechecked 158/98. Patient states she has a blood pressure machine at home but does not use it. States she is taking blood pressure medications as directed.  Weight management: Patient states that she is not happy with her weight. Patient's weight goal is 200 pounds. Currently to 73. States that stress is her biggest barrier to weight loss.  Hyperlipidemia: Patient states that she has been skipping some doses of Lipitor due to her feeling that this is causing reflux symptoms.  States that reflux symptoms are better when she does not take Lipitor. Would like to stop at this time or change to another medication.  Health maintenance: Patient to receive flu shot today.   Review of Systems No chest pain. No shortness ofBreath. No syncope. No cold symptoms. No fever.    Objective:   Physical Exam  Constitutional: She is oriented to person, place, and time. She appears well-developed and well-nourished.       OBESE  HENT:  Head: Normocephalic and atraumatic.  Cardiovascular: Normal rate, regular rhythm and normal heart sounds.   No murmur heard. Pulmonary/Chest: Effort normal. No respiratory distress. She has no wheezes. She has no rales.  Abdominal: Soft. She exhibits no distension.  Musculoskeletal: She exhibits edema.       Trace edema  Neurological: She is alert and oriented to person, place, and time.  Skin: Skin is warm.  Psychiatric: She has a normal mood and affect. Her behavior is normal.          Assessment & Plan:

## 2011-08-15 NOTE — Telephone Encounter (Signed)
Will fwd. Request to PCP .[name]  

## 2011-08-16 NOTE — Assessment & Plan Note (Signed)
Pt taking iron supplement.  Last cbc showed normal hgb.  Will recheck today.  If normal can d/c iron supplement.

## 2011-08-16 NOTE — Assessment & Plan Note (Signed)
Pt states her weight goal is 200lbs.  Currently 273lbs.  Pt encouraged to work on [employer] and healthy diet and return in 1 month for follow up on weight management.

## 2011-08-16 NOTE — Assessment & Plan Note (Signed)
Since pt's compliance to lipitor is low since she feels it causes her to have reflux.  Will stop at this time.  Pt to work on diet and exercise.  Will recheck lipids in 10/2011 and restart on other lipid lowering med if still indicated.

## 2011-08-16 NOTE — Assessment & Plan Note (Signed)
Blood pressure elevated today- 154/81 today.  Elevated at last appt.  On HCTZ 25, lasix 20mg  daily, toprol xl 50mg  daily, apironolactone 25mg , verapamil 240mg  daily.  Pt to keep bp log book during the next month.  And return for follow up i 1 month to ensure that bp wnl.

## 2011-08-16 NOTE — Assessment & Plan Note (Signed)
Pt has h/o stage III kidney disease.  Will avoid nephrotoxic agents.  Will monitor closely.  Will work on improving BP control and improving weight control to help overall health as well as renal disease.

## 2011-08-16 NOTE — Assessment & Plan Note (Signed)
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  Coumadin monitored here at [facility].

## 2011-08-18 ENCOUNTER — Emergency Department (HOSPITAL_COMMUNITY)
Admission: EM | Admit: 2011-08-18 | Discharge: 2011-08-18 | Disposition: A | Discharge status: Home / Self Care | Payer: Medicare Other | Attending: Emergency Medicine | Admitting: Emergency Medicine

## 2011-08-18 ENCOUNTER — Encounter (HOSPITAL_COMMUNITY): Payer: Self-pay | Admitting: Adult Health

## 2011-08-18 ENCOUNTER — Emergency Department (HOSPITAL_COMMUNITY): Payer: Medicare Other

## 2011-08-18 DIAGNOSIS — R269 Unspecified abnormalities of gait and mobility: Secondary | ICD-10-CM | POA: Insufficient documentation

## 2011-08-18 DIAGNOSIS — Z86718 Personal history of other venous thrombosis and embolism: Secondary | ICD-10-CM | POA: Insufficient documentation

## 2011-08-18 DIAGNOSIS — M79609 Pain in unspecified limb: Secondary | ICD-10-CM | POA: Insufficient documentation

## 2011-08-18 DIAGNOSIS — M25552 Pain in left hip: Secondary | ICD-10-CM

## 2011-08-18 DIAGNOSIS — M25559 Pain in unspecified hip: Secondary | ICD-10-CM | POA: Insufficient documentation

## 2011-08-18 DIAGNOSIS — Z7901 Long term (current) use of anticoagulants: Secondary | ICD-10-CM | POA: Insufficient documentation

## 2011-08-18 LAB — DIFFERENTIAL
Basophils Absolute: 0 10*3/uL (ref 0.0–0.1)
Basophils Relative: 0 % (ref 0–1)
Eosinophils Absolute: 0.1 10*3/uL (ref 0.0–0.7)
Eosinophils Relative: 1 % (ref 0–5)
Lymphocytes Relative: 29 % (ref 12–46)
Lymphs Abs: 2.7 10*3/uL (ref 0.7–4.0)
Monocytes Absolute: 0.6 10*3/uL (ref 0.1–1.0)
Monocytes Relative: 7 % (ref 3–12)
Neutro Abs: 5.9 10*3/uL (ref 1.7–7.7)
Neutrophils Relative %: 64 % (ref 43–77)

## 2011-08-18 LAB — CBC
HCT: 38.5 % (ref 36.0–46.0)
Hemoglobin: 12.8 g/dL (ref 12.0–15.0)
MCH: 29.2 pg (ref 26.0–34.0)
MCHC: 33.2 g/dL (ref 30.0–36.0)
MCV: 87.7 fL (ref 78.0–100.0)
Platelets: 304 10*3/uL (ref 150–400)
RBC: 4.39 MIL/uL (ref 3.87–5.11)
RDW: 14.9 % (ref 11.5–15.5)
WBC: 9.3 10*3/uL (ref 4.0–10.5)

## 2011-08-18 LAB — PROTIME-INR
INR: 1.7 — ABNORMAL HIGH (ref 0.00–1.49)
Prothrombin Time: 20.3 seconds — ABNORMAL HIGH (ref 11.6–15.2)

## 2011-08-18 MED ORDER — KETOROLAC TROMETHAMINE 30 MG/ML IJ SOLN
30.0000 mg | Freq: Once | INTRAMUSCULAR | Status: AC
  Administered 2011-08-18: 30 mg via INTRAMUSCULAR
  Filled 2011-08-18: qty 1

## 2011-08-18 MED ORDER — PREDNISONE 20 MG PO TABS: 40.0000 mg | ORAL_TABLET | Freq: Every day | ORAL | Status: AC

## 2011-08-18 NOTE — ED Notes (Signed)
Reports waking from sleep with severe pain in left hip that radiates down left thigh. States it feels like it did when she had DVT. No deformity and denies injury.

## 2011-08-18 NOTE — ED Provider Notes (Signed)
Patient reports significant relief and hip pain. Now reports hip pain is a 3-4/10. Discussed patient's slightly low INR. States her "Coumadin level" was drawn Thursday but she has not heard back from her [facility] regarding the results. Recommended patient call the family practice [facility] tomorrow and relate her results of tonight with a primary care physician to see if they won't make any adjustments in her Coumadin dosage. Patient agreeable with plan. Will plan to discharge home w/ a week of prednisone therapy per plan of Sharen Hones, FNP-C. Patient agreeable with plan  Leanne Chang, NP 08/18/11 2138

## 2011-08-18 NOTE — ED Notes (Signed)
Negative HOMANS sign.

## 2011-08-18 NOTE — ED Notes (Signed)
Patient stable upon discharge. Discharged home with family 

## 2011-08-18 NOTE — ED Provider Notes (Signed)
Medical screening examination/treatment/procedure(s) were performed by non-physician practitioner and as supervising physician I was immediately available for consultation/collaboration.  Loren Racer, MD 08/18/11 2329

## 2011-08-18 NOTE — ED Provider Notes (Signed)
History     CSN: [id number]  Arrival date & time 08/18/11  1514   First MD Initiated Contact with Patient 08/18/11 1830      Chief Complaint  Patient presents with  . Hip Pain    (Consider location/radiation/quality/duration/timing/severity/associated sxs/prior treatment) HPI Comments: L hip pain that radiates to low back and knee worse with movement, palpitation over lateral bursa has remove Hx lumbar "piinched nerve" Hx PE from DVT currently taking Coumadin No lower leg pain or swelling  Does aerate Percocet or hydrocodone as this upsets her stomach and makes her "crazy."  The history is provided by the patient.    Past Medical History  Diagnosis Date  . Hyperlipidemia   . Hypertension   . CKD (chronic kidney disease), stage III     Baseline Cr 1.4  . DJD (degenerative joint disease) of hip   . 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 Recurrent pulmonary embolism   . Obesity (BMI 30-39.9)     Past Surgical History  Procedure Date  . Partial hip arthroplasty Left, 02/2009    Dr Elita Quick  . Shoulder surgery Left 07/15/08    Dr Elita Quick  . Femoral vein ligation 12/2002  . [place] filter 11/03/2003    R Jugular  . Total hip arthroplasty R 05/2203, L 2010  . Tubal ligation 1981  . Transthoracic echocardiogram     EF 55-60%, RV mod dilated, mildly increased LV wall thickness    Family History  Problem Relation Age of Onset  . Heart disease Mother   . Heart disease Father   . Cancer Brother     History  Substance Use Topics  . Smoking status: Never Smoker   . Smokeless tobacco: Not on file  . Alcohol Use: Not on file    OB History    Grav Para Term Preterm Abortions TAB SAB Ect Mult Living                  Review of Systems  Constitutional: Positive for activity change. Negative for fever.  Cardiovascular: Negative for  leg swelling.  Musculoskeletal: Positive for gait problem. Negative for back pain and joint swelling.  Skin: Negative for wound.  Neurological: Negative for dizziness.    Allergies  Review of patient's allergies indicates no known allergies.  Home Medications   Current Outpatient Rx  Name Route Sig Dispense Refill  . FERROUS SULFATE 325 (65 FE) MG PO TABS  TAKE ONE TABLET TWICE DAILY 64 tablet 3  . FUROSEMIDE 20 MG PO TABS      . HYDROCHLOROTHIAZIDE 25 MG PO TABS  TAKE ONE (1) TABLET EACH DAY 34 tablet 3  . METOPROLOL SUCCINATE ER 50 MG PO TB24  TAKE ONE (1) TABLET EACH DAY 34 tablet 3  . SPIRONOLACTONE 25 MG PO TABS  TAKE ONE (1) TABLET EACH DAY 34 tablet 3  . VERAPAMIL HCL ER 240 MG PO TBCR  TAKE ONE TABLET AT BEDTIME 34 tablet 3  . WARFARIN SODIUM 5 MG PO TABS Oral Take 5-7.5 mg by mouth See admin instructions. Takes 1 tablet on mondays, Wednesday, and fridays for 5mg  dosage and take 1 and 1/2 tablet on sundays, tuesdays, thursdays, and saturdays for 7.5mg  dosage    . PREDNISONE 20 MG PO TABS Oral Take 2 tablets (40 mg total) by mouth daily. 10 tablet 0  BP 130/68  Pulse 66  Temp(Src) 98.9 F (37.2 C) (Oral)  Resp 20  SpO2 96%  Physical Exam  Constitutional: She is oriented to person, place, and time. She appears well-developed and well-nourished.  HENT:  Head: Normocephalic.  Eyes: Pupils are equal, round, and reactive to light.  Neck: Normal range of motion.  Cardiovascular: Normal rate.   Pulmonary/Chest: Effort normal.  Abdominal: Soft. Bowel sounds are normal.  Musculoskeletal: She exhibits tenderness. She exhibits no edema.       Left hip: She exhibits decreased range of motion and bony tenderness. She exhibits normal strength, no swelling and no deformity.       Legs:      No lumbar sacral pain   Neurological: She is alert and oriented to person, place, and time.  Skin: Skin is warm and dry.  Psychiatric: She has a normal mood and affect.    ED Course    Procedures (including critical care time)  Labs Reviewed  PROTIME-INR - Abnormal; Notable for the following:    Prothrombin Time 20.3 (*)    INR 1.70 (*)    All other components within normal limits  CBC  DIFFERENTIAL  LAB REPORT - SCANNED   No results found.   1. Hip pain, left     This patient has a negative Homans sign.  No lower extremity swelling.  She is currently taking Coumadin.  Will check INR level.  Doubt this is DVT.  Most likely bursitis of the lateral bursa or the left hip.  Negative straight leg raise does not have any lumbar, sacral pain  MDM  Bursitis        Arman Filter, NP 08/22/11 [phone]

## 2011-08-19 NOTE — Telephone Encounter (Signed)
Pt is calling again and needs it this week.  Moving  This Saturday and needs before then.  Is also asking about the refill on her meds - had spoken to [place] about this last Thursday.

## 2011-08-21 NOTE — Telephone Encounter (Signed)
Will forward to Dr Caviness 

## 2011-08-21 NOTE — Telephone Encounter (Signed)
Called pt.  Told pt that she needs to come in to have an exam asap since she is having postmenopausal bleeding.

## 2011-08-21 NOTE — Telephone Encounter (Signed)
Spoke with pt on the phone.  She is going to have [facility] fax form to our office for needed equipment.

## 2011-08-21 NOTE — Telephone Encounter (Signed)
Pt is calling again about her meds and bed.  pls call her today

## 2011-08-21 NOTE — Telephone Encounter (Signed)
Pt states she went to hosp. on Sunday and her coumadin level was 1.7 wants to know what she should do.

## 2011-08-21 NOTE — Telephone Encounter (Signed)
She is also spotting and not sure what to do.

## 2011-08-22 ENCOUNTER — Encounter: Payer: Self-pay | Admitting: Family Medicine

## 2011-08-22 ENCOUNTER — Telehealth: Payer: Self-pay | Admitting: Family Medicine

## 2011-08-22 NOTE — Telephone Encounter (Signed)
Please fax rx for patient to receive hospital bed from [facility].  Their fax# [phone]

## 2011-08-27 NOTE — ED Provider Notes (Signed)
Medical screening examination/treatment/procedure(s) were performed by non-physician practitioner and as supervising physician I was immediately available for consultation/collaboration.  Loren Racer, MD 08/27/11 [phone]

## 2011-08-29 NOTE — Telephone Encounter (Signed)
Prescription sent to [facility].

## 2011-08-30 ENCOUNTER — Ambulatory Visit: Payer: Medicare Other | Admitting: Family Medicine

## 2011-09-02 ENCOUNTER — Ambulatory Visit (INDEPENDENT_AMBULATORY_CARE_PROVIDER_SITE_OTHER): Payer: Medicare Other | Admitting: Family Medicine

## 2011-09-02 ENCOUNTER — Encounter: Payer: Self-pay | Admitting: Family Medicine

## 2011-09-02 ENCOUNTER — Ambulatory Visit (INDEPENDENT_AMBULATORY_CARE_PROVIDER_SITE_OTHER): Payer: Medicare Other | Admitting: *Deleted

## 2011-09-02 DIAGNOSIS — I10 Essential (primary) hypertension: Secondary | ICD-10-CM

## 2011-09-02 DIAGNOSIS — Z7901 Long term (current) use of anticoagulants: Secondary | ICD-10-CM

## 2011-09-02 DIAGNOSIS — I2699 Other pulmonary embolism without acute cor pulmonale: Secondary | ICD-10-CM

## 2011-09-02 DIAGNOSIS — J069 Acute upper respiratory infection, unspecified: Secondary | ICD-10-CM

## 2011-09-02 DIAGNOSIS — N950 Postmenopausal bleeding: Secondary | ICD-10-CM

## 2011-09-02 LAB — POCT INR: INR: 1.9

## 2011-09-02 NOTE — Patient Instructions (Signed)
I think your symptoms are from a virus: mucinex 2 x day. Afrin as needed for no more than 3 days.  Body aches or fever- tylenol  Vaginal bleeding: Make an appointment for in 1 week for exam and biopsy.   Blood pressure: Increase toprol XL to 75mg - take 1 1/2 tablets daily.  And return in 1 week for recheck.

## 2011-09-02 NOTE — Progress Notes (Signed)
  Subjective:    Patient ID: [name], female    DOB: [birth year], 66 y.o.   MRN: [id number]  HPI Sinus congestion: Patient reports sinus congestion x2 weeks. Positive headache off and on.  Positive cough-productive. Positive nausea. No vomiting. No abdominal pain. No sore throat. No ear pain. Has not had any second worsening. No fever. Has not had any sick contacts.  Hypertension followup: Patient blood pressure 159/82 today. Patient states that she checks her blood pressure at home and it runs in the 150s over 80s consistently. Patient heart rate 91 today. States she is taking all medications as directed. No syncope. No dizziness. Headache as per above.  Vaginal bleeding: Patient reports that she has had vaginal spotting off and on for the past 2-3 weeks. She states her last menstrual period was at age 67. And has not had any bleeding since that time. No other vaginal discharge. No recent sexual activity. No abdominal pain.   Review of Systems As per above.    Objective:   Physical Exam  HENT:  Mouth/Throat: No oropharyngeal exudate.       TM normal bilateral, no fluid, no erythema  Clear nasal drainage  Mild throat erythema  Eyes: Pupils are equal, round, and reactive to light. Right eye exhibits no discharge. Left eye exhibits no discharge.  Cardiovascular: Normal rate, regular rhythm and normal heart sounds.   No murmur heard. Pulmonary/Chest: Effort normal. No respiratory distress. She has no wheezes. She has no rales.  Abdominal: Soft. She exhibits no distension. There is no tenderness.  Genitourinary:       deferred  Skin: No rash noted.          Assessment & Plan:

## 2011-09-04 ENCOUNTER — Other Ambulatory Visit: Payer: Self-pay | Admitting: Family Medicine

## 2011-09-04 DIAGNOSIS — N950 Postmenopausal bleeding: Secondary | ICD-10-CM

## 2011-09-04 DIAGNOSIS — J069 Acute upper respiratory infection, unspecified: Secondary | ICD-10-CM | POA: Insufficient documentation

## 2011-09-04 HISTORY — DX: Postmenopausal bleeding: N95.0

## 2011-09-04 NOTE — Assessment & Plan Note (Signed)
Continue all medications as per regimen except increase toprol XL to 75mg - take 1 1/2 tablets daily.  And return in 1 week for recheck.

## 2011-09-04 NOTE — Assessment & Plan Note (Signed)
INR theraputic.  This does need to have thorough workup.  Pt to return in 1 week for endometrial biopsy and pelvic ultrasound.  Will defer pelvic exam to next week since this was not pt's primary concern today.

## 2011-09-04 NOTE — Assessment & Plan Note (Signed)
Symptomatic treatment only at this time.  See pt instructions.  Pt to return if any new or worsening of symptoms.

## 2011-09-09 ENCOUNTER — Ambulatory Visit (HOSPITAL_COMMUNITY): Payer: Medicare Other | Attending: Family Medicine

## 2011-09-11 ENCOUNTER — Ambulatory Visit: Payer: Medicare Other | Admitting: Family Medicine

## 2011-10-01 ENCOUNTER — Ambulatory Visit: Payer: Medicare Other | Admitting: Family Medicine

## 2011-10-10 ENCOUNTER — Ambulatory Visit: Payer: Medicare Other | Admitting: Family Medicine

## 2011-10-10 ENCOUNTER — Telehealth: Payer: Self-pay | Admitting: Family Medicine

## 2011-10-10 NOTE — Telephone Encounter (Signed)
Needs refills on Coumadin, Verapamil, Ferrous Sulfate and Toprol XL sent to [facility] on [address].  She is completely out of Coumadin.

## 2011-10-10 NOTE — Telephone Encounter (Signed)
Forward to PCP for Rx refill.[name], [name]

## 2011-10-11 ENCOUNTER — Other Ambulatory Visit: Payer: Self-pay | Admitting: Family Medicine

## 2011-10-11 MED ORDER — VERAPAMIL HCL ER 240 MG PO TBCR: 240.0000 mg | EXTENDED_RELEASE_TABLET | Freq: Every day | ORAL | Status: DC

## 2011-10-11 MED ORDER — METOPROLOL SUCCINATE ER 50 MG PO TB24: 50.0000 mg | ORAL_TABLET | Freq: Every day | ORAL | Status: DC

## 2011-10-11 MED ORDER — WARFARIN SODIUM 5 MG PO TABS: 5.0000 mg | ORAL_TABLET | ORAL | Status: DC

## 2011-10-11 MED ORDER — FERROUS SULFATE 325 (65 FE) MG PO TABS: 325.0000 mg | ORAL_TABLET | Freq: Two times a day (BID) | ORAL | Status: DC

## 2011-10-11 NOTE — Telephone Encounter (Signed)
Let pt know I have called in refills to get her through until her appointment.  Please schedule her for a follow up appointment.

## 2011-10-11 NOTE — Telephone Encounter (Signed)
Patient has called back.  She is completely out of her Coumadin and is concerned about ending up back in the hospital with a blood clot.

## 2011-10-11 NOTE — Telephone Encounter (Signed)
Waiting for call back. See Dr.Caviness message. [name], [name]

## 2011-10-11 NOTE — Telephone Encounter (Signed)
I called and lvm letting the patient know about her meds and to call back to reschedule.  I did say that no more meds can be refilled until she makes and keeps her appt since she Canceled 2 and No Showed for 2 appt since January.

## 2011-10-17 ENCOUNTER — Ambulatory Visit (INDEPENDENT_AMBULATORY_CARE_PROVIDER_SITE_OTHER): Payer: Medicare Other | Admitting: Family Medicine

## 2011-10-17 ENCOUNTER — Encounter: Payer: Self-pay | Admitting: Family Medicine

## 2011-10-17 VITALS — BP 120/79 | HR 66 | Ht 65.0 in | Wt 249.0 lb

## 2011-10-17 DIAGNOSIS — E669 Obesity, unspecified: Secondary | ICD-10-CM

## 2011-10-17 DIAGNOSIS — I10 Essential (primary) hypertension: Secondary | ICD-10-CM

## 2011-10-17 DIAGNOSIS — N950 Postmenopausal bleeding: Secondary | ICD-10-CM

## 2011-10-17 NOTE — Progress Notes (Signed)
  Subjective:    Patient ID: [name], female    DOB: [birth year], 66 y.o.   MRN: [id number]  HPI Blood pressure followup: Patient's blood pressure is 120/79 today. Taking medication as directed. No vision changes. No syncope. No dizziness. No headache.  Weight management: Patient has lost 10 pounds since last visit. She states she has done this probably because she was not feeling well from viral illness-at last visit. But mostly because of portion control. Patient states she is very proud of his weight loss.  Vaginal bleeding followup: Patient states she's had no further vaginal bleeding. (See last office note) She does not feel that she needs any further workup. No abdominal pain. No back pain. No vaginal discharge. Patient is not currently sexually active.   Review of Systems As per above.    Objective:   Physical Exam  Constitutional: She is oriented to person, place, and time. She appears well-developed and well-nourished.  HENT:  Head: Normocephalic and atraumatic.  Neck: Normal range of motion. No thyromegaly present.  Cardiovascular: Normal rate, regular rhythm and normal heart sounds.   No murmur heard. Pulmonary/Chest: Effort normal and breath sounds normal. No respiratory distress. She has no wheezes.  Abdominal: Soft. Bowel sounds are normal. She exhibits no distension. There is no tenderness. There is no rebound and no guarding.  Musculoskeletal: She exhibits no edema.  Neurological: She is alert and oriented to person, place, and time.  Psychiatric: She has a normal mood and affect.          Assessment & Plan:

## 2011-10-17 NOTE — Patient Instructions (Signed)
Blood pressure: Great work! Your blood pressure 120/79.  Good job with the weight loss and controlling your portion sizes!  Weight management: Great job!!  Continue the great work!  Vaginal bleeding: Make an appointment for endometrial biopsy.  My office will call you with your ultrasound appointment.

## 2011-10-22 NOTE — Assessment & Plan Note (Signed)
Pt taking meds as directed.  bP 120/79 today.  Pt to continue to monitor at home.

## 2011-10-22 NOTE — Assessment & Plan Note (Signed)
Congratulated pt on 10lb weight loss. Encouraged continued portion control.  Also encouraged exercise plan.

## 2011-10-22 NOTE — Assessment & Plan Note (Signed)
Even though pt has not had any further vaginal bleeding episodes recently- I still feel that it would be best to get a pelvic u/s and endometrial biopsy.  Pt agrees with this plan.  Pt to make an appointment in the next 1-2 weeks for biopsy.  The staff will call pt with u/s appt.

## 2011-10-29 ENCOUNTER — Telehealth: Payer: Self-pay | Admitting: Family Medicine

## 2011-10-29 NOTE — Telephone Encounter (Addendum)
Returned call to patient.  States this is a new problem.  Is due to follow-up with Dr. Edmonia James and will discuss at appt, but has not received appt info for pelvic U/S or endometrial biopsy.  R/S'd appt for pelvic U/S @ [facility] for 11/04/11 @ 9:30 am and made appt for endometrial Bx with Dr. Edmonia James on 11/14/11 at 9:45 am.  Patient is on Coumadin and wants to know if she needs to continue on it for the procedure.  Will route note to Dr. Edmonia James and call patient back.  Gaylene Colombo, RN

## 2011-10-29 NOTE — Telephone Encounter (Signed)
Per [facility]---Dr. Edmonia James will need to sign orders in Epic for pelvic U/S.   Gaylene Astarita, RN

## 2011-10-29 NOTE — Telephone Encounter (Signed)
Wants to talk to nurse about her right hand.  The tips of each finger is numb/tingley and feels cold (from the inside) - wants to know what she should do.

## 2011-10-31 NOTE — Telephone Encounter (Signed)
I entered the order and signed it when I placed the order.  So, the order should already have my name signed.  Please call patient and let her know that she should take coumadin per her normal regimen.

## 2011-10-31 NOTE — Telephone Encounter (Signed)
Called patient and informed her to take coumadin per normal regimen on day of endometrial bx.  Contacted [facility] regarding orders needing electronic signature for pelvic & transvaginal U/S.  Per Rodman Pickle, electronic signature "fell off" due to patient being rescheduled.  Dr. Edmonia James can contact Jeannie on Monday (11/04/11) at [phone] for assistance with signing orders.  Spoke with Dr. Cherrie Gauze print orders to be signed and fax to [phone].  Gaylene Wisor, RN

## 2011-10-31 NOTE — Telephone Encounter (Signed)
Signed orders faxed to [facility]/[phone].  Gaylene Wilmott, RN

## 2011-11-04 ENCOUNTER — Ambulatory Visit (HOSPITAL_COMMUNITY)
Admission: RE | Admit: 2011-11-04 | Discharge: 2011-11-04 | Disposition: A | Discharge status: Home / Self Care | Payer: Medicare Other | Source: Ambulatory Visit | Attending: Family Medicine | Admitting: Family Medicine

## 2011-11-04 ENCOUNTER — Encounter: Payer: Self-pay | Admitting: Obstetrics & Gynecology

## 2011-11-04 ENCOUNTER — Telehealth: Payer: Self-pay | Admitting: Family Medicine

## 2011-11-04 DIAGNOSIS — D251 Intramural leiomyoma of uterus: Secondary | ICD-10-CM | POA: Insufficient documentation

## 2011-11-04 DIAGNOSIS — N950 Postmenopausal bleeding: Secondary | ICD-10-CM

## 2011-11-04 NOTE — Telephone Encounter (Signed)
Called patient and stated that I feel that we need to get this endometrial biopsy sooner. Patient is scheduled with Dr. Jennette Kettle on Thursday, April 4 Will send this note to Dr. Jennette Kettle to know that this patient needs an endometrial biopsy and a more recent Pap smear for postmenopausal bleeding and abnormal abdominal ultrasound shows diffuse thickening. Patient is stop bleeding at this time so I do not think Provera as necessary. Will also refer patient to gynecology in case a D&C as necessary.

## 2011-11-06 ENCOUNTER — Encounter (HOSPITAL_COMMUNITY): Payer: Self-pay | Admitting: Emergency Medicine

## 2011-11-06 ENCOUNTER — Emergency Department (HOSPITAL_COMMUNITY): Payer: Medicare Other

## 2011-11-06 ENCOUNTER — Ambulatory Visit (INDEPENDENT_AMBULATORY_CARE_PROVIDER_SITE_OTHER): Payer: Medicare Other | Admitting: Family Medicine

## 2011-11-06 ENCOUNTER — Encounter: Payer: Self-pay | Admitting: Family Medicine

## 2011-11-06 ENCOUNTER — Emergency Department (HOSPITAL_COMMUNITY)
Admission: EM | Admit: 2011-11-06 | Discharge: 2011-11-06 | Disposition: A | Discharge status: Home / Self Care | Payer: Medicare Other | Attending: Emergency Medicine | Admitting: Emergency Medicine

## 2011-11-06 ENCOUNTER — Telehealth: Payer: Self-pay | Admitting: Family Medicine

## 2011-11-06 VITALS — BP 155/88 | HR 73 | Ht 65.0 in | Wt 249.0 lb

## 2011-11-06 DIAGNOSIS — Z79899 Other long term (current) drug therapy: Secondary | ICD-10-CM | POA: Insufficient documentation

## 2011-11-06 DIAGNOSIS — Z86711 Personal history of pulmonary embolism: Secondary | ICD-10-CM | POA: Insufficient documentation

## 2011-11-06 DIAGNOSIS — W208XXA Other cause of strike by thrown, projected or falling object, initial encounter: Secondary | ICD-10-CM | POA: Insufficient documentation

## 2011-11-06 DIAGNOSIS — Z86718 Personal history of other venous thrombosis and embolism: Secondary | ICD-10-CM | POA: Insufficient documentation

## 2011-11-06 DIAGNOSIS — Z9889 Other specified postprocedural states: Secondary | ICD-10-CM | POA: Insufficient documentation

## 2011-11-06 DIAGNOSIS — S8990XA Unspecified injury of unspecified lower leg, initial encounter: Secondary | ICD-10-CM

## 2011-11-06 DIAGNOSIS — S9030XA Contusion of unspecified foot, initial encounter: Secondary | ICD-10-CM | POA: Insufficient documentation

## 2011-11-06 DIAGNOSIS — N183 Chronic kidney disease, stage 3 unspecified: Secondary | ICD-10-CM | POA: Insufficient documentation

## 2011-11-06 DIAGNOSIS — E785 Hyperlipidemia, unspecified: Secondary | ICD-10-CM | POA: Insufficient documentation

## 2011-11-06 DIAGNOSIS — I129 Hypertensive chronic kidney disease with stage 1 through stage 4 chronic kidney disease, or unspecified chronic kidney disease: Secondary | ICD-10-CM | POA: Insufficient documentation

## 2011-11-06 DIAGNOSIS — M79609 Pain in unspecified limb: Secondary | ICD-10-CM | POA: Insufficient documentation

## 2011-11-06 DIAGNOSIS — M7989 Other specified soft tissue disorders: Secondary | ICD-10-CM | POA: Insufficient documentation

## 2011-11-06 DIAGNOSIS — Z7901 Long term (current) use of anticoagulants: Secondary | ICD-10-CM | POA: Insufficient documentation

## 2011-11-06 DIAGNOSIS — S99919A Unspecified injury of unspecified ankle, initial encounter: Secondary | ICD-10-CM

## 2011-11-06 DIAGNOSIS — M1990 Unspecified osteoarthritis, unspecified site: Secondary | ICD-10-CM | POA: Insufficient documentation

## 2011-11-06 DIAGNOSIS — S99922A Unspecified injury of left foot, initial encounter: Secondary | ICD-10-CM | POA: Insufficient documentation

## 2011-11-06 MED ORDER — HYDROCODONE-ACETAMINOPHEN 5-500 MG PO TABS: 1.0000 | ORAL_TABLET | Freq: Four times a day (QID) | ORAL | Status: AC | PRN

## 2011-11-06 NOTE — Telephone Encounter (Signed)
70 pound child fell on her leg and has hurt foot/leg (swollen) and she wants to be seen this late afternoon- has an appt for endo bx here tomorrow.

## 2011-11-06 NOTE — Telephone Encounter (Signed)
Appointment scheduled for SDA today.

## 2011-11-06 NOTE — ED Provider Notes (Signed)
Medical screening examination/treatment/procedure(s) were performed by non-physician practitioner and as supervising physician I was immediately available for consultation/collaboration.  Lief Palmatier R. Ahsley Attwood, MD 11/06/11 2344 

## 2011-11-06 NOTE — Patient Instructions (Addendum)
Please continue to elevate and ice your foot as much as possible. After 48 hours you may begin to use heating pads or warm baths to help speed up the healing process. Please go to [facility] for your xray, and please start wearing the support boot. Follow up with Dr Edmonia James at your next scheduled appointment

## 2011-11-06 NOTE — Discharge Instructions (Signed)
Contusion A contusion is a deep bruise. Contusions happen when an injury causes bleeding under the skin. Signs of bruising include pain, puffiness (swelling), and discolored skin. The contusion may turn blue, purple, or yellow. HOME CARE   Put ice on the injured area.   Put ice in a plastic bag.   Place a towel between your skin and the bag.   Leave the ice on for 15 to 20 minutes, 3 to 4 times a day.   Only take medicine as told by your doctor.   Rest the injured area.   If possible, raise (elevate) the injured area to lessen puffiness.  GET HELP RIGHT AWAY IF:   You have more bruising or puffiness.   You have pain that is getting worse.   Your puffiness or pain is not helped by medicine.  MAKE SURE YOU:   Understand these instructions.   Will watch your condition.   Will get help right away if you are not doing well or get worse.  Document Released: 01/08/2008 Document Revised: 07/11/2011 Document Reviewed: 05/27/2011 [facility] Patient Information 2012 [place], [place]. Wear the postoperative shoe for comfort measure to follow up with your primary care provider as scheduled tomorrow

## 2011-11-06 NOTE — ED Provider Notes (Signed)
History     CSN: [id number]  Arrival date & time 11/06/11  2104   First MD Initiated Contact with Patient 11/06/11 2200      Chief Complaint  Patient presents with  . Foot Injury    (Consider location/radiation/quality/duration/timing/severity/associated sxs/prior treatment) HPI Comments: Patient states her 66-year-old daughter, fell on her foot last night.  She has bruising she was seen at outpatient clinic and was supposed to go to radiology for an x-ray.  She has a prescription for postop shoe and followup tomorrow, but she inadvertently registered in the emergency room  Patient is a 66 y.o. female presenting with foot injury. The history is provided by the patient.  Foot Injury  The incident occurred yesterday. The incident occurred at home. The injury mechanism was a direct blow. The pain is present in the left foot. Pertinent negatives include no numbness.    Past Medical History  Diagnosis Date  . Hyperlipidemia   . Hypertension   . CKD (chronic kidney disease), stage III     Baseline Cr 1.4  . DJD (degenerative joint disease) of hip   . 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 Recurrent pulmonary embolism   . Obesity (BMI 30-39.9)     Past Surgical History  Procedure Date  . Partial hip arthroplasty Left, 02/2009    Dr Elita Quick  . Shoulder surgery Left 07/15/08    Dr Elita Quick  . Femoral vein ligation 12/2002  . Greenfield filter 11/03/2003    R Jugular  . Total hip arthroplasty R 05/2203, L 2010  . Tubal ligation 1981  . Transthoracic echocardiogram     EF 55-60%, RV mod dilated, mildly increased LV wall thickness    Family History  Problem Relation Age of Onset  . Heart disease Mother   . Heart disease Father   . Cancer Brother     History  Substance Use Topics  . Smoking status: Never Smoker   . Smokeless tobacco: Not on  file  . Alcohol Use: No    OB History    Grav Para Term Preterm Abortions TAB SAB Ect Mult Living                  Review of Systems  Constitutional: Negative for fever.  Musculoskeletal: Positive for joint swelling.  Neurological: Negative for weakness and numbness.    Allergies  Review of patient's allergies indicates no known allergies.  Home Medications   Current Outpatient Rx  Name Route Sig Dispense Refill  . FERROUS SULFATE 325 (65 FE) MG PO TABS Oral Take 325 mg by mouth 2 (two) times daily.    . FUROSEMIDE 20 MG PO TABS Oral Take 20 mg by mouth daily.    Marland Kitchen HYDROCHLOROTHIAZIDE 25 MG PO TABS Oral Take 25 mg by mouth daily.    Marland Kitchen METOPROLOL SUCCINATE ER 50 MG PO TB24 Oral Take 50 mg by mouth daily. Take with or immediately following a meal.    . SPIRONOLACTONE 25 MG PO TABS Oral Take 25 mg by mouth daily.    Marland Kitchen VERAPAMIL HCL ER 240 MG PO TBCR Oral Take 240 mg by mouth at bedtime.    . WARFARIN SODIUM 5 MG PO TABS Oral Take 5 mg by mouth daily. Takes on mon wed and fri    . WARFARIN SODIUM 7.5 MG PO TABS Oral Take 7.5  mg by mouth daily. Takes on sat sun tues and thurs    . HYDROCODONE-ACETAMINOPHEN 5-500 MG PO TABS Oral Take 1-2 tablets by mouth every 6 (six) hours as needed for pain. 15 tablet 0    BP 118/69  Pulse 73  Temp(Src) 98.6 F (37 C) (Oral)  Resp 20  SpO2 96%  Physical Exam  Constitutional: She appears well-developed and well-nourished.  HENT:  Head: Normocephalic.  Eyes: Pupils are equal, round, and reactive to light.  Cardiovascular: Normal rate.   Pulmonary/Chest: Effort normal.  Musculoskeletal:       Patient has discoloration and swelling at the base of all 5 toes left foot shows good range of motion of the toes.  Good cap refill of toes.  X-ray is negative for fracture  Skin: Skin is warm.    ED Course  Procedures (including critical care time)  Labs Reviewed - No data to display Dg Foot Complete Left  11/06/2011  *RADIOLOGY REPORT*   Clinical Data: Foot injury.  Pain and swelling.  LEFT FOOT - COMPLETE 3+ VIEW  Comparison: None  Findings: The joint spaces are maintained.  No acute fracture. Small calcaneal heel spurs are noted.  IMPRESSION: No acute bony findings.  Original Report Authenticated By: P. Loralie Champagne, M.D.     1. Foot contusion       MDM  Foot contusion, will supply with postop shoe.  Patient has followup appointment tomorrow with outpatient        Arman Filter, NP 11/06/11 2304  Arman Filter, NP 11/06/11 1610

## 2011-11-06 NOTE — ED Notes (Signed)
PT. REPORTS LEFT FOOT INJURY WITH PAIN/SWELLING , DAUGHTER ACCIDENTALLY FELL ON HER FOOT LAST NIGHT.

## 2011-11-07 ENCOUNTER — Ambulatory Visit (INDEPENDENT_AMBULATORY_CARE_PROVIDER_SITE_OTHER): Payer: Medicare Other | Admitting: Family Medicine

## 2011-11-07 VITALS — BP 110/62 | HR 52 | Temp 98.5°F | Ht 65.0 in | Wt 249.0 lb

## 2011-11-07 DIAGNOSIS — N950 Postmenopausal bleeding: Secondary | ICD-10-CM

## 2011-11-07 NOTE — Progress Notes (Signed)
Patient ID: [name], female   DOB: 1946-04-13, 66 y.o.   MRN: [id number] Patient here for endometrial biopsy. She had ultrasound showing endometrial stripe a 29 mm. The ultrasound was obtained for postmenopausal bleeding. We tried to do endometrial biopsy today but she has bilateral total hip replacements and we were unable to get her in an appropriate lithotomy position. We will refer her to [facility] for further care in the GYN clinic. They have different types of tables that can safely get her in lithotomy position.

## 2011-11-07 NOTE — Patient Instructions (Signed)
We will schedule you for an endometrial biopsy and Pap smear at [facility].   We will also refer you to a gynecologist to evaluate your uterus.   Please make sure you have an appointment with Dr. Edmonia James coming up.

## 2011-11-08 NOTE — Progress Notes (Signed)
  Subjective:    Patient ID: [name], female    DOB: 05-Sep-1945, 66 y.o.   MRN: [id number]  HPI CC: Foot pain  Pt had 70lb daughter fall on foot last night. No pop. Foot immediately tender at the MTP of the 2nd adn 3rd toe of the L foot. Pt tried soaking foot in hot salt bath and noted the swelling and pain worsened. Has not tried anything else to reduce the pain. Pain made worse w/ weight bearing and ambulation. Foot sinificantly more swollen this morning. Little sleep overnight due to pain. No h/o previous foot injury. Uses a cain for ambulation at baseline.    Review of Systems Denies: recent infection, blood loss, syncope, lightheadedness, rash, change in sensation of foot    Objective:   Physical Exam  L foot edematous, warm to the touch, and tight. Bruising noted overt he 2nd and 3rd MTP joint tracking proximately. Tender to the touch. ROM minimal of 2nd through 4th toes of L foot. Sensation intact       Assessment & Plan:

## 2011-11-08 NOTE — Assessment & Plan Note (Addendum)
Sprain vs fracture. [facility] today. Pt being seen in clinic tomorrow for uterine biopsy and can f/u w/ results then. Post op boot. Tylenol for pain. No NSAIDs due to elevated INR due to warfarin therapy. Rest and elevation. Ice for next 24to 48 hrs

## 2011-11-14 ENCOUNTER — Ambulatory Visit (INDEPENDENT_AMBULATORY_CARE_PROVIDER_SITE_OTHER): Payer: Medicare Other | Admitting: Family Medicine

## 2011-11-14 ENCOUNTER — Encounter: Payer: Self-pay | Admitting: Family Medicine

## 2011-11-14 VITALS — BP 124/72 | HR 60 | Ht 65.0 in | Wt 245.5 lb

## 2011-11-14 DIAGNOSIS — R209 Unspecified disturbances of skin sensation: Secondary | ICD-10-CM

## 2011-11-14 DIAGNOSIS — N950 Postmenopausal bleeding: Secondary | ICD-10-CM

## 2011-11-14 DIAGNOSIS — M549 Dorsalgia, unspecified: Secondary | ICD-10-CM

## 2011-11-14 DIAGNOSIS — R200 Anesthesia of skin: Secondary | ICD-10-CM

## 2011-11-14 NOTE — Patient Instructions (Signed)
Vaginal bleeding: Appointment on May 2nd at [facility] hospital.   Back Pain: Take tylenol if needed.  I do think that this muscular.   Continue to walk. If this doesn't better within the next couple of months I will send you Physical therapy.   Numbness in right hand: May be neuropathy.  Continue to monitor symptoms.  We will continue to work this up once we get the bigger problems addressed.

## 2011-11-14 NOTE — Progress Notes (Signed)
  Subjective:    Patient ID: [name], female    DOB: 07/28/1946, 66 y.o.   MRN: [id number]  HPI H/o vaginal bleeding: No further vaginal bleeding.  No vaginal discharge. [facility]  Unable to get pt positioned appropriately here 2/2 hip issues in order to obtain endometrial biopsy.  [place] results are as follows:   IMPRESSION:  Diffusely abnormally thickened and internally cystic endometrium.  Malignancy or hyperplasia could have this appearance, less likely  polyp or submucosal fibroid. Sampling is recommended. Results discussed with pt and son.   Numbness in fingertips of right hand x2-3 weeks:  Initially just in the finger tips of right hand.  Now 1/2 way down first 3 fingers and just in the tips of the 4th and 5th fingers.  No redness.  No joint swelling.  No fever. No trauma. No numbness in other extremities. Can't seem to open jars as well with the left hand.   Back pain: Off and on, right mid back pain,  worse with walking, only occurs if she walks a long distance. Maybe occurs 2-3 weeks.   Resting/sitting makes it better. No abd pain.  No flank pain.  No urinary frequency.  No retention. No dysuria. No trauma.  No falls.       Review of Systems    as per above.  Objective:   Physical Exam  Constitutional: She is oriented to person, place, and time. She appears well-developed and well-nourished.  Cardiovascular: Normal rate, regular rhythm and normal heart sounds.   No murmur heard. Pulmonary/Chest: Effort normal. No respiratory distress.  Abdominal: Soft. She exhibits no distension. There is no tenderness.  Musculoskeletal: She exhibits no edema.       Right hand: + decreased sensation in tips off all fingers of right hand.  No joint redness. No joint swelling.  Good grip strength- equal bilateral.  No rash.     Back exam: No pain on palpation of back.  Limited rom- but equal bilateral- per baseline.   Antalgic gait- per baseline hip issues.  Unable to reproduce symptoms.  Normal reflexes.  Normal sensation in legs.  Negative straight leg raise.     Neurological: She is alert and oriented to person, place, and time.  Skin: No rash noted.  Psychiatric: She has a normal mood and affect. Her behavior is normal.          Assessment & Plan:

## 2011-11-15 DIAGNOSIS — M549 Dorsalgia, unspecified: Secondary | ICD-10-CM | POA: Insufficient documentation

## 2011-11-15 DIAGNOSIS — R200 Anesthesia of skin: Secondary | ICD-10-CM

## 2011-11-15 HISTORY — DX: Anesthesia of skin: R20.0

## 2011-11-15 NOTE — Assessment & Plan Note (Signed)
Cause unknown, could be a type of neuropathy.  Isn't impairing patient's function.  Will monitor symptoms closely.   Will work up further once we have finished workup for possible uterine cancer.  Pt agrees with this plan.  Pt is to Return if new or worsening of symptoms.

## 2011-11-15 NOTE — Assessment & Plan Note (Signed)
Most likely muscular.  If reoccurs can take tylenol.  Encouraged walking and stretching.  If no improvement or if new or worsening symptoms pt to return for recheck.  Can also consider referral to PT if not improving.

## 2011-11-15 NOTE — Assessment & Plan Note (Signed)
Abnormal, concerning u/s.  Unsuccessful attempt at endometrial biopsy here at [facility].  Pt has appt at [facility] hospital outpatient clinic for may 2nd.  Called attending, Dr. Shawnie Pons, and discussed u/s findings.  She requested that I send an inbox message to her concerning this case.  She will review and try to get pt in more quickly.  Pt notified by telephone on 11/15/11, that we are working on getting appt moved up.

## 2011-11-21 ENCOUNTER — Encounter: Payer: Self-pay | Admitting: Obstetrics & Gynecology

## 2011-11-21 ENCOUNTER — Other Ambulatory Visit (HOSPITAL_COMMUNITY)
Admission: RE | Admit: 2011-11-21 | Discharge: 2011-11-21 | Disposition: A | Discharge status: Home / Self Care | Payer: Medicare Other | Source: Ambulatory Visit | Attending: Obstetrics & Gynecology | Admitting: Obstetrics & Gynecology

## 2011-11-21 ENCOUNTER — Ambulatory Visit (INDEPENDENT_AMBULATORY_CARE_PROVIDER_SITE_OTHER): Payer: Medicare Other | Admitting: Obstetrics & Gynecology

## 2011-11-21 VITALS — BP 141/81 | HR 80 | Temp 97.3°F | Ht 65.0 in | Wt 246.0 lb

## 2011-11-21 DIAGNOSIS — N950 Postmenopausal bleeding: Secondary | ICD-10-CM

## 2011-11-21 DIAGNOSIS — Z124 Encounter for screening for malignant neoplasm of cervix: Secondary | ICD-10-CM | POA: Insufficient documentation

## 2011-11-21 MED ORDER — MISOPROSTOL 200 MCG PO TABS: ORAL_TABLET | ORAL | Status: DC

## 2011-11-21 NOTE — Progress Notes (Signed)
Addended by: Lynnell Dike on: 11/21/2011 04:23 PM   Modules accepted: Orders

## 2011-11-21 NOTE — Progress Notes (Addendum)
Subjective:    Patient ID: [name], female    DOB: 09-15-1945, 66 y.o.   MRN: [id number]  [id number] No LMP recorded. Patient is postmenopausal. The patient is referred by the family practice Center do to spotting that she noticed at the beginning of the year. She's not had any bleeding since January. A pelvic ultrasound showed an endometrium that was abnormal and she was recommended to come for endometrial biopsy. No complaints of pain or abnormal discharge. She's not sexually active since she was widowed in 42. Past Medical History  Diagnosis Date  . Hyperlipidemia   . Hypertension   . CKD (chronic kidney disease), stage III     Baseline Cr 1.4  . DJD (degenerative joint disease) of hip   . 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 Recurrent pulmonary embolism   . Obesity (BMI 30-39.9)    Past Surgical History  Procedure Date  . Partial hip arthroplasty Left, 02/2009    Dr Elita Quick  . Shoulder surgery Left 07/15/08    Dr Elita Quick  . Femoral vein ligation 12/2002  . Greenfield filter 11/03/2003    R Jugular  . Total hip arthroplasty R 05/2203, L 2010  . Tubal ligation 1981  . Transthoracic echocardiogram     EF 55-60%, RV mod dilated, mildly increased LV wall thickness   Current Outpatient Prescriptions on File Prior to Visit  Medication Sig Dispense Refill  . ferrous sulfate 325 (65 FE) MG tablet Take 325 mg by mouth 2 (two) times daily.      . furosemide (LASIX) 20 MG tablet Take 20 mg by mouth daily.      . hydrochlorothiazide (HYDRODIURIL) 25 MG tablet Take 25 mg by mouth daily.      . metoprolol succinate (TOPROL-XL) 50 MG 24 hr tablet Take 50 mg by mouth daily. Take with or immediately following a meal.      . spironolactone (ALDACTONE) 25 MG tablet Take 25 mg by mouth daily.      . verapamil (CALAN-SR) 240 MG CR tablet Take 240 mg  by mouth at bedtime.      Marland Kitchen warfarin (COUMADIN) 5 MG tablet Take 5 mg by mouth daily. Takes on mon wed and fri      . warfarin (COUMADIN) 7.5 MG tablet Take 7.5 mg by mouth daily. Takes on sat sun tues and thurs       No Known Allergies Family History  Problem Relation Age of Onset  . Heart disease Mother   . Heart disease Father   . Cancer Brother   . Cancer Sister    History   Social History  . Marital Status: Widowed    Spouse Name: N/A    Number of Children: N/A  . Years of Education: N/A   Occupational History  . Not on file.   Social History Main Topics  . Smoking status: Never Smoker   . Smokeless tobacco: Not on file  . Alcohol Use: No  . Drug Use: No  . Sexually Active: Not Currently    Birth Control/ Protection: None   Other Topics Concern  . Not on file   Social History Narrative   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 Lives at home with  daughter born in '87. Non smoker. Non alcohol drinker.; Fosters children .  Review of Systems  Constitutional: Negative for activity change and unexpected weight change.  Gastrointestinal: Negative for abdominal distention.  Genitourinary: Negative for vaginal bleeding and vaginal discharge.       Objective: Filed Vitals:   11/21/11 1353  Height: 5\' 5"  (1.651 m)  Weight: 246 lb (111.585 kg)    Filed Vitals:   11/21/11 1353  BP: 141/81  Pulse: 80  Temp: 97.3 F (36.3 C)  TempSrc: Oral  Height: 5\' 5"  (1.651 m)  Weight: 246 lb (111.585 kg)      Physical Exam  Constitutional: She appears well-developed and well-nourished. No distress.  HENT:  Head: Normocephalic.  Pulmonary/Chest: Effort normal.  Abdominal: Soft. She exhibits no distension and no mass. There is no tenderness.  Genitourinary: Vagina normal.       Vagina appeared normal. The cervix was stenotic. Uterus normal size no masses. Pap test was obtained. Attempted to dilate the cervix in order  to endometrial biopsy but I was unable to pass a cervical dilator. The procedure was abandoned.          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 She will return for endometrial biopsy after a dose of Cytotec 400 mg per vagina nightly for the procedure.  Dr. Scheryl Darter  11/21/2011

## 2011-11-21 NOTE — Progress Notes (Signed)
Addended by: Adam Phenix on: 11/21/2011 03:27 PM   Modules accepted: Orders

## 2011-11-21 NOTE — Patient Instructions (Signed)
Postmenopausal Bleeding Menopause is commonly referred to as the "change in life." It is a time when the fertile years, the time of ovulating and having menstrual periods, has come to an end. It is also determined by not having menstrual periods for 12 months.  Postmenopausal bleeding is any bleeding a woman has after she has entered into menopause. Any type of postmenopausal bleeding, even if it appears to be a typical menstrual period, is concerning. This should be evaluated by your caregiver.  CAUSES   Hormone therapy.   Cancer of the cervix or cancer of the lining of the uterus (endometrial cancer).   Thinning of the uterine lining (uterine atrophy).   Thyroid diseases.   Certain medicines.   Infection of the uterus or cervix.   Inflammation or irritation of the uterine lining (endometritis).   Estrogen-secreting tumors.   Growths (polyps) on the cervix, uterine lining, or uterus.   Uterine tumors (fibroids).   Being very overweight (obese).  DIAGNOSIS  Your caregiver will take a medical history and ask questions. A physical exam will also be performed. Further tests may include:   A transvaginal ultrasound. An ultrasound wand or probe is inserted into your vagina to view the pelvic organs.   A biopsy of the lining of the uterus (endometrium). A sample of the endometrium is removed and examined.   A hysteroscopy. Your caregiver may use an instrument with a light and a camera attached to it (hysteroscope). The hysteroscope is used to look inside the uterus for problems.   A dilation and curettage (D&C). Tissue is removed from the uterine lining to be examined for problems.  TREATMENT  Treatment depends on the cause of the bleeding. Some treatments include:   Surgery.   Medicines.   Hormones.   A hysteroscopy or D&C to remove polyps or fibroids.   Changing or stopping a current medicine you are taking.  Talk to your caregiver about your specific treatment. HOME CARE  INSTRUCTIONS   Maintain a healthy weight.   Keep regular pelvic exams and Pap tests.  SEEK MEDICAL CARE IF:   You have bleeding, even if it is light in comparison to your previous periods.   Your bleeding lasts more than 1 week.   You have abdominal pain.   You develop bleeding with sexual intercourse.  SEEK IMMEDIATE MEDICAL CARE IF:   You have a fever, chills, headache, dizziness, muscle aches, and bleeding.   You have severe pain with bleeding.   You are passing blood clots.   You have bleeding and need more than 1 pad an hour.   You feel faint.  MAKE SURE YOU:  Understand these instructions.   Will watch your condition.   Will get help right away if you are not doing well or get worse.  Document Released: 10/30/2005 Document Revised: 07/11/2011 Document Reviewed: 03/28/2011 [employer] Patient Information 2012 [employer]. 

## 2011-11-22 ENCOUNTER — Other Ambulatory Visit: Payer: Self-pay | Admitting: Family Medicine

## 2011-12-05 ENCOUNTER — Encounter: Payer: Medicare Other | Admitting: Obstetrics & Gynecology

## 2011-12-19 ENCOUNTER — Encounter: Payer: Self-pay | Admitting: Obstetrics and Gynecology

## 2011-12-19 ENCOUNTER — Ambulatory Visit (INDEPENDENT_AMBULATORY_CARE_PROVIDER_SITE_OTHER): Payer: Medicare Other | Admitting: Obstetrics and Gynecology

## 2011-12-19 VITALS — BP 122/75 | HR 82 | Temp 98.9°F | Ht 65.0 in | Wt 250.0 lb

## 2011-12-19 DIAGNOSIS — N950 Postmenopausal bleeding: Secondary | ICD-10-CM

## 2011-12-19 NOTE — Patient Instructions (Signed)
Endometrial Biopsy This is a test in which a tissue sample (a biopsy) is taken from inside the uterus (womb). It is then looked at by a specialist under a microscope to see if the tissue is normal or abnormal. The endometrium is the lining of the uterus. This test helps determine where you are in your menstrual cycle and how hormone levels are affecting the lining of the uterus. Another use for this test is to diagnose endometrial cancer, tuberculosis, polyps, or inflammatory conditions and to evaluate uterine bleeding. PREPARATION FOR TEST No preparation or fasting is necessary. NORMAL FINDINGS No pathologic conditions. Presence of "secretory-type" endometrium 3 to 5 days before to normal menstruation. Ranges for normal findings may vary among different laboratories and hospitals. You should always check with your doctor after having lab work or other tests done to discuss the meaning of your test results and whether your values are considered within normal limits. MEANING OF TEST  Your caregiver will go over the test results with you and discuss the importance and meaning of your results, as well as treatment options and the need for additional tests if necessary. OBTAINING THE TEST RESULTS It is your responsibility to obtain your test results. Ask the lab or department performing the test when and how you will get your results. Document Released: 11/22/2004 Document Revised: 07/11/2011 Document Reviewed: 07/01/2008 [facility] Patient Information 2012 [place], [place].  Someone from our office will call with pre-operative appointment for your surgery: hysteroscopy/ D&C

## 2011-12-19 NOTE — Progress Notes (Signed)
Patient ID: [name], female   DOB: 1946-07-31, 66 y.o.   MRN: [id number] History 66 yo [id number] postmenopausal female was seen here 11/21/11 for PMB which last occurred Jan 2013. Dr. Debroah Loop  was unable to advance the dilator due to stenotic cx so could not do endometrial biopsy. She returns now for another attempt after having placed Cytotec 400 mcg intravaginally last night. Her Pap from last visit was negative.  Of note she has had both hips replaced and has difficulty with lithotomy position.  See Dr. Barb Merino of 11/21/11 for complete hx.         Result Notes    [place] of 09/04/11: It was necessary to proceed with endovaginal exam following the  transabdominal exam to visualize the endometrium and ovaries, not  seen transabdominally.  Findings:  Uterus: 7.6 x 5.5 x 4.9 cm. Anteverted, anteflexed. Intramural  fibroids are noted, largest at the posterior uterine body measuring  1.6 x 1.6 x 1.0 cm. No appreciable mass effect upon the  endometrium or subserosal / pedunculated component.  Endometrium: 2.9 cm, diffusely abnormal with internal cystic spaces  and suggestion of internal color flow.  Right ovary: 2.2 x 2.2 x 1.2 cm. Normal.  Left ovary: 2.4 x 1.9 x 1.2 cm. Normal.  Other findings: No free fluid  IMPRESSION:  Diffusely abnormally thickened and internally cystic endometrium.  Malignancy or hyperplasia could have this appearance, less likely  polyp or submucosal fibroid. Sampling is recommended.  Original Report Authenticated By: Harrel Lemon, M.D.   Physical exam Filed Vitals:   12/19/11 1353  BP: 122/75  Pulse: 82  Temp: 98.9 F (37.2 C)   Patient given informed consent, signed copy in the chart, time out was performed. Appropriate time out taken. . The patient was placed in the lithotomy position and the cervix brought into view with sterile speculum.  Portio of cervix cleansed x 2 with betadine swabs.  A tenaculum was placed in the anterior lip of the  cervix. Unable to advance dilator and had Dr. Marice Potter come in and attempt but unsuccessful and procedure abandoned    Assessment Cervical stenosis Postmenopausal bleeding with thickened endometrium  Plan Will schedule for [facility] hysteroscopy per Dr. Marice Potter

## 2011-12-20 ENCOUNTER — Telehealth: Payer: Self-pay | Admitting: Family Medicine

## 2011-12-20 NOTE — Telephone Encounter (Signed)
Did not have endo bx today and was told she needed a D & C and she has a few questions about this - pls call

## 2011-12-20 NOTE — Telephone Encounter (Signed)
Forward to PCP for review

## 2011-12-24 ENCOUNTER — Encounter (HOSPITAL_COMMUNITY): Payer: Self-pay

## 2011-12-24 ENCOUNTER — Emergency Department (INDEPENDENT_AMBULATORY_CARE_PROVIDER_SITE_OTHER): Payer: Medicare Other

## 2011-12-24 ENCOUNTER — Emergency Department (INDEPENDENT_AMBULATORY_CARE_PROVIDER_SITE_OTHER)
Admission: EM | Admit: 2011-12-24 | Discharge: 2011-12-24 | Disposition: A | Discharge status: Home Health | Payer: Medicare Other | Source: Home / Self Care | Attending: Emergency Medicine | Admitting: Emergency Medicine

## 2011-12-24 DIAGNOSIS — W19XXXA Unspecified fall, initial encounter: Secondary | ICD-10-CM

## 2011-12-24 DIAGNOSIS — S6990XA Unspecified injury of unspecified wrist, hand and finger(s), initial encounter: Secondary | ICD-10-CM

## 2011-12-24 DIAGNOSIS — S59909A Unspecified injury of unspecified elbow, initial encounter: Secondary | ICD-10-CM

## 2011-12-24 DIAGNOSIS — IMO0002 Reserved for concepts with insufficient information to code with codable children: Secondary | ICD-10-CM

## 2011-12-24 DIAGNOSIS — M25331 Other instability, right wrist: Secondary | ICD-10-CM

## 2011-12-24 NOTE — Discharge Instructions (Signed)
Wear splint as much as possible.  Follow up with Dr. Amanda Pea in 1 week.

## 2011-12-24 NOTE — ED Provider Notes (Signed)
Chief Complaint  Patient presents with  . Wrist Injury    History of Present Illness:   [name] is a 66 year old female who fell today around noon at home, injuring her right wrist. She was chasing her grandchild and fell on outstretched hand. Ever since then she's had pain, swelling, and some decreased range of motion of her right wrist. There is no numbness or tingling.  Review of Systems:  Other than noted above, the patient denies any of the following symptoms: Systemic:  No fevers, chills, sweats, or aches.  No fatigue or tiredness. Musculoskeletal:  No joint pain, arthritis, bursitis, swelling, back pain, or neck pain. Neurological:  No muscular weakness, paresthesias, headache, or trouble with speech or coordination.  No dizziness.   PMFSH:  Past medical history, family history, social history, meds, and allergies were reviewed.  Physical Exam:   Vital signs:  BP 146/60  Pulse 61  Temp(Src) 98.2 F (36.8 C) (Oral)  Resp 20  SpO2 100% Gen:  Alert and oriented times 3.  In no distress. Musculoskeletal: He has pain to palpation over the mid wrist, extending on down to the forearm and up to the middle 3 metacarpals. There is slight swelling. No bruising or deformity. There is no pain to palpation over the anatomical snuff box area, the radial or the ulnar styloid. The wrist has a full range of motion with some pain on full flexion and full extension and Otherwise, all joints had a full a ROM with no swelling, bruising or deformity.  No edema, pulses full. Extremities were warm and pink.  Capillary refill was brisk.  Skin:  Clear, warm and dry.  No rash. Neuro:  Alert and oriented times 3.  Muscle strength was normal.  Sensation was intact to light touch.   Radiology:  Dg Wrist Complete Right  12/24/2011  *RADIOLOGY REPORT*  Clinical Data: Larey Seat on outstretched hand earlier today, right wrist pain and swelling.  RIGHT WRIST - COMPLETE 3+ VIEW  Comparison: None.  Findings: No evidence  of acute fracture or dislocation.  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  There may be slight widening of the space between the scaphoid and lunate.  IMPRESSION: No acute fractures.  Query scapholunate ligament injury, as there may be slight widening of the space between these bones. 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  Original Report Authenticated By: Arnell Sieving, M.D.   Course in Urgent Care Center:   The patient was placed in a thumb spica splint.  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  Plan:   1.  The following meds were prescribed:   New Prescriptions   No medications on file   2.  The patient was instructed in symptomatic care, including rest and activity, elevation, application of ice and compression.  Appropriate handouts were given. 3.  The patient was told to return if becoming worse in any way, if no better in 3 or 4 days, and given some red flag symptoms that would indicate earlier return.   4.  The patient was told to follow up with Dr. Amanda Pea in one week. Dr. Amanda Pea was called and he recommended a removable thumb spica, and followup with him in one week.   Reuben Likes, MD 12/24/11 [phone]

## 2011-12-24 NOTE — ED Notes (Signed)
Pt c/o R wrist pain following fall at home.  Pt states she tripped over her own feet and fell onto tile floor.  Pt denies LOC.  Pt states pain increases to movement and palpation.  Some swelling noted to R wrist.

## 2011-12-25 NOTE — Telephone Encounter (Signed)
Spoke to pt on phone today- pt asking for note for medicaid.  Pt has adopted special needs child- and needs to submit request to medicaid to provide care assistance for child while she has surgery and recovers.  [facility] since [facility] is there.  I also Sent inbox note to Poe, CNM who saw pt at last appt at [facility] hospital clinic- to ask details about when pt will be scheduled in OR and what physical restrictions pt will have during recovery periods.  If needed I can draft the letter but I need this information in order to write it accurately.

## 2012-01-07 ENCOUNTER — Telehealth: Payer: Self-pay | Admitting: Family Medicine

## 2012-01-07 NOTE — Telephone Encounter (Signed)
Patient is calling because she is going to have surgery in July.  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  She said that Dr. Edmonia James needs to go to [email or web address].org, change of status order is the form needed and it needs to be faxed to [phone].  She said that they need it right away and it goes to the attention of Karl.

## 2012-01-09 NOTE — Telephone Encounter (Signed)
Patient is calling back.  She said that since tomorrow is the last day of school, they need the letter as soon as possible.

## 2012-01-10 ENCOUNTER — Encounter: Payer: Self-pay | Admitting: Family Medicine

## 2012-01-10 NOTE — Telephone Encounter (Signed)
Went to website- could not find the form that pt is requesting on the website.  I did draft a letter stating the date of the procedure and that she will have 2 weeks with no heavy lifting after the procedure.  Sharon Mcgregor, do you mind faxing this letter to the fax number she gave to see if this will work since I can't find the form.  If pt needs that particular form complete she will need to bring it in or have it faxed to [place] and I would be glad to complete it.

## 2012-01-13 NOTE — Telephone Encounter (Signed)
Patient is calling and was informed about the letter from Dr. Edmonia James.  I let her know that it was faxed this morning.  She also wants to let Dr. Edmonia James know that she has started spotting and she doesn't know if she needs to be seen.

## 2012-01-14 ENCOUNTER — Telehealth: Payer: Self-pay | Admitting: Family Medicine

## 2012-01-14 NOTE — Telephone Encounter (Signed)
Pt is bleeding heavier and is not sure what to do.  Is supposed to have D & C but concerned that it is heavier and heavier.

## 2012-01-14 NOTE — Telephone Encounter (Addendum)
Patient states she started  spotting again this past Sunday. Then yesterday was a little heavier and today is heavier still. She is using 6 pads per day . Bleeding is dark , slimy and clots.  She has been to [facility] and has seen MD there . Advised her to call [facility] now to let them know what is going on and I also will check with MD  here.

## 2012-01-14 NOTE — Telephone Encounter (Signed)
Patient notified and she did speak with someone at [facility] clinic and they will be calling her back. Also gave her message from Dr. Jennette Kettle.

## 2012-01-14 NOTE — Telephone Encounter (Signed)
Patient called to say that her bleeding has restarted with heavier flows and now having clots.  Patient is sched to have a D&C on 7/19, but would prefer to just go ahead and have the hysterectomy.  Please call her asap to discuss this matter.  Need to be advised.

## 2012-01-14 NOTE — Telephone Encounter (Signed)
Consulted with Dr. Jennette Kettle and she advises for patient to contact [facility] . If unable to get in touch with anyone and bleeding  profusely she will need to go to ED at [facility].

## 2012-01-15 ENCOUNTER — Telehealth: Payer: Self-pay | Admitting: Obstetrics and Gynecology

## 2012-01-15 NOTE — Telephone Encounter (Signed)
Please see phone note on 6/11

## 2012-01-15 NOTE — Telephone Encounter (Signed)
Please have patient call [facility] and discuss this with the OB/GYN surgeons-- they will be able to better help her with her questions.  Since they are the ones that are managing this problem at this time and performing the procedure. Please let me know if any questions.

## 2012-01-15 NOTE — Telephone Encounter (Signed)
Patient left phone message with questions about continuous bleed. She is scheduled for [facility] on July 19th. Called patient back 2X but no answer. Left message to call us back.

## 2012-01-16 NOTE — Telephone Encounter (Signed)
1144am- Pt called for nurse Gaylyn Rong concerning surgery.

## 2012-01-16 NOTE — Telephone Encounter (Signed)
Called patient. She had c/o bleeding but now had stopped. States she is only spotting now but has new c/o of moderate pain on her pelvic and middle back that is tolerable to her.  Advised patient that if pain increases and intolerable then she should go to [facility]. Patient agrees. Also reminded patient of D&C appt on 01/22/12.

## 2012-01-20 ENCOUNTER — Other Ambulatory Visit: Payer: Self-pay | Admitting: Family Medicine

## 2012-02-03 ENCOUNTER — Encounter (HOSPITAL_COMMUNITY): Payer: Self-pay | Admitting: Pharmacist

## 2012-02-10 ENCOUNTER — Other Ambulatory Visit: Payer: Self-pay

## 2012-02-10 ENCOUNTER — Encounter (HOSPITAL_COMMUNITY)
Admission: RE | Admit: 2012-02-10 | Discharge: 2012-02-10 | Disposition: A | Discharge status: Home / Self Care | Payer: Medicare Other | Source: Ambulatory Visit | Attending: Obstetrics & Gynecology | Admitting: Obstetrics & Gynecology

## 2012-02-10 ENCOUNTER — Encounter (HOSPITAL_COMMUNITY): Payer: Self-pay

## 2012-02-10 HISTORY — DX: Gastro-esophageal reflux disease without esophagitis: K21.9

## 2012-02-10 HISTORY — DX: Unspecified sprain of unspecified wrist, initial encounter: S63.509A

## 2012-02-10 HISTORY — DX: Strain of unspecified muscle, fascia and tendon at wrist and hand level, unspecified hand, initial encounter: S66.919A

## 2012-02-10 LAB — BASIC METABOLIC PANEL
BUN: 26 mg/dL — ABNORMAL HIGH (ref 6–23)
CO2: 28 mEq/L (ref 19–32)
Calcium: 9.9 mg/dL (ref 8.4–10.5)
Chloride: 100 mEq/L (ref 96–112)
Creatinine, Ser: 1.32 mg/dL — ABNORMAL HIGH (ref 0.50–1.10)
GFR calc Af Amer: 48 mL/min — ABNORMAL LOW (ref >90)
GFR calc non Af Amer: 41 mL/min — ABNORMAL LOW (ref >90)
Glucose, Bld: 79 mg/dL (ref 70–99)
Potassium: 3.4 mEq/L — ABNORMAL LOW (ref 3.5–5.1)
Sodium: 139 mEq/L (ref 135–145)

## 2012-02-10 LAB — CBC
HCT: 40.5 % (ref 36.0–46.0)
Hemoglobin: 13.1 g/dL (ref 12.0–15.0)
MCH: 28.6 pg (ref 26.0–34.0)
MCHC: 32.3 g/dL (ref 30.0–36.0)
MCV: 88.4 fL (ref 78.0–100.0)
Platelets: 314 10*3/uL (ref 150–400)
RBC: 4.58 MIL/uL (ref 3.87–5.11)
RDW: 14.9 % (ref 11.5–15.5)
WBC: 8 10*3/uL (ref 4.0–10.5)

## 2012-02-10 LAB — PROTIME-INR
INR: 2.71 — ABNORMAL HIGH (ref 0.00–1.49)
Prothrombin Time: 29.2 seconds — ABNORMAL HIGH (ref 11.6–15.2)

## 2012-02-10 LAB — APTT: aPTT: 44 seconds — ABNORMAL HIGH (ref 24–37)

## 2012-02-10 NOTE — Patient Instructions (Addendum)
YOUR PROCEDURE IS SCHEDULED ON:02/21/12 ENTER THROUGH THE MAIN ENTRANCE OF [facility] AT:1 pm  USE DESK PHONE AND DIAL [phone] TO INFORM us OF YOUR ARRIVAL  CALL [phone] IF YOU HAVE ANY QUESTIONS OR PROBLEMS PRIOR TO YOUR ARRIVAL.  REMEMBER: DO NOT EAT AFTER MIDNIGHT :Thursday   SPECIAL INSTRUCTIONS:clear liquids ok until 10:30 am Friday YOU MAY BRUSH YOUR TEETH THE MORNING OF SURGERY   TAKE THESE MEDICINES THE DAY OF SURGERY WITH SIP OF WATER:morning blood pressure meds   DO NOT WEAR JEWELRY, EYE MAKEUP, LIPSTICK OR DARK FINGERNAIL POLISH DO NOT WEAR LOTIONS  DO NOT SHAVE FOR 48 HOURS PRIOR TO SURGERY  YOU WILL NOT BE ALLOWED TO DRIVE YOURSELF HOME.

## 2012-02-10 NOTE — Pre-Procedure Instructions (Signed)
I spoke with Lupita Leash at [facility] to inquire about the plan for pt's Coumadin routine. Lupita Leash will check results of PT,PTT, etc that I am drawing today and will call pt with instructions according to lab results.

## 2012-02-11 ENCOUNTER — Telehealth: Payer: Self-pay | Admitting: Family Medicine

## 2012-02-11 NOTE — Telephone Encounter (Signed)
Ms. Kathrine Cords,   Can you please notify patient and care team at [facility] hospital regarding my recommendations regarding anticoagulation around time of pt's surgery.   5 days prior to surgery: Stop warfarin  3 days prior to surgery: Start lovenox- (make an appointment with Dr. Raymondo Band for lovenox teaching.   Day before surgery: Come in for INR- if INR >1.5 will give Vit K 1-2 mg po.   Evening before surgery: Do not give Lovenox dose.   Day of Surgery- no blood thinners. When taking po fluids can restart coumadin.   Restart lovenox 24 hours after surgery if no post op bleeding.   Come back to [facility] 3 days after surgery for recheck of INR and continue lovenox until MD at [facility] instructs you to stop.

## 2012-02-13 ENCOUNTER — Ambulatory Visit (INDEPENDENT_AMBULATORY_CARE_PROVIDER_SITE_OTHER): Payer: Medicare Other | Admitting: Pharmacist

## 2012-02-13 ENCOUNTER — Encounter: Payer: Self-pay | Admitting: Pharmacist

## 2012-02-13 ENCOUNTER — Telehealth: Payer: Self-pay | Admitting: Critical Care Medicine

## 2012-02-13 VITALS — BP 133/71 | HR 76 | Temp 98.3°F | Ht 64.0 in | Wt 242.0 lb

## 2012-02-13 DIAGNOSIS — I2699 Other pulmonary embolism without acute cor pulmonale: Secondary | ICD-10-CM

## 2012-02-13 MED ORDER — ENOXAPARIN SODIUM 100 MG/ML ~~LOC~~ SOLN: 100.0000 mg | Freq: Two times a day (BID) | SUBCUTANEOUS | Status: DC

## 2012-02-13 NOTE — Telephone Encounter (Signed)
i am not available but she can /should see Tammy Parrett preop preferably

## 2012-02-13 NOTE — Telephone Encounter (Signed)
Since TP can not see pt's for pre-op I scheduled pt to come in and see RA on 02/17/12 at 3:45. Nothing further was needed

## 2012-02-13 NOTE — Progress Notes (Signed)
That would be a good idea regarding Xarelto transition  p wright

## 2012-02-13 NOTE — Progress Notes (Signed)
  Subjective:    Patient ID: [name], female    DOB: April 21, 1946, 66 y.o.   MRN: [id number]  HPI Patient arrives with son and adopted child [name]").   Patient is scheduled to have D/C procedure at [facility] on Friday July 19th.   She is here to be educated on Lovenox injection technique and scheduling of Lovenox dosing prior to procedure.   Patient has history of PE on multiple occasions in the past.  She notes that Dr. Delford Field had mentioned the option of an alternative anticoagulant at her last visit with him (about 1 year ago).  (likely this is Xarelto/rivaroxaban)   Review of Systems     Objective:   Physical Exam        Assessment & Plan:   Patient with history of multiple episodes of Pulmonary Embolus currently being anticoagulated with Warfarin and requiring bridging with Lovenox for D/C procedure next week.   Following education on proper injection technique, patient verbalize understanding of injection directions and demonstrated understanding of the use of the injection using demo device and injection block.   Educated on the risks including injection site burning and bruising. Patient was instructed to use full syringe dose.    Directions provided included; take your warfarin as you have been taking through next Monday 7/15 On Tuesday 7/16 - STOP taking warfarin and in the evening give the full shot of Lovenox . Give Lovenox twice on Wed in the AM and the PM Give Lovenox twice daily on Thursday.   Come in on Thursday 7/18 to [facility] and have INR.  NO Lovenox on Friday.   Daisey Must on your procedure.    You will get more directions on anticoagulation AFTER your procedure.   Xarelto may be option after procedure for long-term management.

## 2012-02-13 NOTE — Telephone Encounter (Signed)
I spoke with pt and she states she is scheduled to have a d&c/hysterectomy on [facility]. She did not know the doctors name that is going to do this. She states her u/s had shown she had multiple polyps around her uterus. She is going to start Lovenox injections on Tuesday. She is wanting to know if Dr. Delford Field needs to see her before this surgery. Please advise Dr. Delford Field thanks

## 2012-02-13 NOTE — Patient Instructions (Addendum)
Take your warfarin as you have been taking through next Monday 7/15 On Tuesday 7/16 - STOP taking warfarin and in the evening give the full shot of Lovenox . Give Lovenox twice on Wed in the AM and the PM Give Lovenox twice daily on Thursday.   Come in on Thursday 7/18 to [facility] and have INR.  NO Lovenox on Friday.   Daisey Must on your procedure.    You will get more directions on anticoagulation AFTER your procedure.   I will talk with Drs Edmonia James and Delford Field about the option of Xarelto/rivaroxaban.

## 2012-02-13 NOTE — Assessment & Plan Note (Addendum)
Patient with history of multiple episodes of Pulmonary Embolus currently being anticoagulated with Warfarin and requiring bridging with Lovenox for D/C procedure next week.   Following education on proper injection technique, patient verbalize understanding of injection directions and demonstrated understanding of the use of the injection using demo device and injection block.   Educated on the risks including injection site burning and bruising. Patient was instructed to use full syringe dose.    Directions provided included; take your warfarin as you have been taking through next Monday 7/15 On Tuesday 7/16 - STOP taking warfarin and in the evening give the full shot of Lovenox . Give Lovenox twice on Wed (AM and PM) Give Lovenox twice daily on Thursday (AM and PM).   Come in on Thursday 7/18 to [place] and have INR.  NO Lovenox on Friday.   Daisey Must on your procedure.    You will get more directions on anticoagulation AFTER your procedure.   Xarelto may be option after procedure for long-term management.

## 2012-02-14 NOTE — Progress Notes (Signed)
Patient ID: [name], female   DOB: 07-23-1946, 66 y.o.   MRN: [id number] Reviewed and agree with Dr. Macky Lower documentation and management.

## 2012-02-17 ENCOUNTER — Ambulatory Visit (INDEPENDENT_AMBULATORY_CARE_PROVIDER_SITE_OTHER): Payer: Medicare Other | Admitting: Pulmonary Disease

## 2012-02-17 ENCOUNTER — Encounter: Payer: Self-pay | Admitting: Pulmonary Disease

## 2012-02-17 VITALS — BP 132/60 | HR 71 | Temp 99.1°F | Ht 65.0 in | Wt 248.0 lb

## 2012-02-17 DIAGNOSIS — I2699 Other pulmonary embolism without acute cor pulmonale: Secondary | ICD-10-CM

## 2012-02-17 NOTE — Progress Notes (Signed)
  Subjective:    Patient ID: [name], female    DOB: [birth year], 66 y.o.   MRN: [id number]  HPI 66 yo AAF wiht prior hx of Pulmonary embollii in 1997 with recurrence in 2005 when coumadin dosing was adjusted and INR fell. Pt had IVC filter placed in 2005 with bleeding complications. Pt did well and has been on coumadin since that time.  Pt now in [facility] clinic and being followed. 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 Pt is limited in ambulation.   There have not been any issues with coumadin dosing or bleeding recently   02/17/2012  Pt is scheduled to have d&c/hysterectomy on 02/21/12 over at [facility].  Developed post-menopausal bleeding , pelvic US showed cystic endometrium, biopsy could not be performed.Pt states her breathing has been good. denies any wheezing, chest tx, cough INR 2.7 on 02/10/12  Past Medical History  Diagnosis Date  . Hyperlipidemia   . Hypertension   . 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 Recurrent pulmonary embolism   . Obesity (BMI 30-39.9)   . DJD (degenerative joint disease) of hip   . Sprain and strain of wrist   . CKD (chronic kidney disease), stage III     pt doesn't know why this is listed  . GERD (gastroesophageal reflux disease)     h/o in the 1990s   No Known Allergies  Review of Systems Patient denies significant dyspnea,cough, hemoptysis,  chest pain, palpitations, pedal edema, orthopnea, paroxysmal nocturnal dyspnea, lightheadedness, nausea, vomiting, abdominal or  leg pains      Objective:   Physical Exam  Gen. Pleasant, well-nourished, in no distress, normal affect ENT - no lesions, no post nasal drip Neck: No JVD, no thyromegaly, no carotid bruits Lungs: no use of accessory muscles, no  dullness to percussion, clear without rales or rhonchi  Cardiovascular: Rhythm regular, heart sounds  normal, no murmurs or gallops, 2+ peripheral edema Abdomen: soft and non-tender, no hepatosplenomegaly, BS normal. Musculoskeletal: No deformities, no cyanosis or clubbing Neuro:  alert, non focal       Assessment & Plan:

## 2012-02-17 NOTE — Assessment & Plan Note (Addendum)
Ok to proceed with surgery Stop taking coumadin tonight Stat taking lovenox shots from tomorrow as prescribed We can ask pharmacy to switch you to Rivaroxaban instead of coumadin  (post surgery) which would not require blood monitoring - we discussed risks of his medication

## 2012-02-17 NOTE — Progress Notes (Signed)
Patient ID: [name], female   DOB: 09-Mar-1946, 66 y.o.   MRN: [id number] Reviewed and agree with Dr. Macky Lower management and documentation.

## 2012-02-17 NOTE — Patient Instructions (Addendum)
Ok to proceed with surgery Stop taking coumadin tonight Stat taking lovenox shots from tomorrow as prescribed We can ask pharmacy to switch you to Rivaroxaban (post surgery) which would not require blood monitoring - we discussed risks of his medication

## 2012-02-20 ENCOUNTER — Other Ambulatory Visit: Payer: Self-pay | Admitting: Family Medicine

## 2012-02-20 ENCOUNTER — Ambulatory Visit (INDEPENDENT_AMBULATORY_CARE_PROVIDER_SITE_OTHER): Payer: Medicare Other | Admitting: *Deleted

## 2012-02-20 DIAGNOSIS — I2699 Other pulmonary embolism without acute cor pulmonale: Secondary | ICD-10-CM

## 2012-02-20 DIAGNOSIS — Z7901 Long term (current) use of anticoagulants: Secondary | ICD-10-CM

## 2012-02-20 LAB — POCT INR: INR: 2

## 2012-02-20 NOTE — Progress Notes (Signed)
Per instructions given by Madelon Lips, PHARMD on 02-13-12: Since pt's INR was 2.0, discussed with Dr. Edmonia James and per her instructions:  Pt given 1/4 of 5 mg Vit K tablet. Phytonadione Mephyton 5 mg Lot: [id number] Exp Date: 06/08/12 Mosie Lukes (ASCP)cm

## 2012-02-21 ENCOUNTER — Encounter (HOSPITAL_COMMUNITY): Payer: Self-pay | Admitting: Anesthesiology

## 2012-02-21 ENCOUNTER — Ambulatory Visit (HOSPITAL_COMMUNITY): Payer: Medicare Other | Admitting: Anesthesiology

## 2012-02-21 ENCOUNTER — Ambulatory Visit (HOSPITAL_COMMUNITY)
Admission: RE | Admit: 2012-02-21 | Discharge: 2012-02-21 | Disposition: A | Discharge status: Home / Self Care | Payer: Medicare Other | Source: Ambulatory Visit | Attending: Obstetrics & Gynecology | Admitting: Obstetrics & Gynecology

## 2012-02-21 ENCOUNTER — Encounter (HOSPITAL_COMMUNITY)
Admission: RE | Disposition: A | Discharge status: Home / Self Care | Payer: Self-pay | Source: Ambulatory Visit | Attending: Obstetrics & Gynecology

## 2012-02-21 DIAGNOSIS — N950 Postmenopausal bleeding: Secondary | ICD-10-CM | POA: Insufficient documentation

## 2012-02-21 DIAGNOSIS — Z96649 Presence of unspecified artificial hip joint: Secondary | ICD-10-CM | POA: Insufficient documentation

## 2012-02-21 DIAGNOSIS — R9389 Abnormal findings on diagnostic imaging of other specified body structures: Secondary | ICD-10-CM | POA: Insufficient documentation

## 2012-02-21 HISTORY — PX: DILATION AND CURETTAGE OF UTERUS: SHX78

## 2012-02-21 SURGERY — DILATION AND CURETTAGE
Anesthesia: General | Site: Vagina | Wound class: Clean Contaminated

## 2012-02-21 MED ORDER — BUPIVACAINE HCL (PF) 0.5 % IJ SOLN
INTRAMUSCULAR | Status: DC | PRN
  Administered 2012-02-21: 10 mL

## 2012-02-21 MED ORDER — DEXAMETHASONE SODIUM PHOSPHATE 10 MG/ML IJ SOLN
INTRAMUSCULAR | Status: AC
  Filled 2012-02-21: qty 1

## 2012-02-21 MED ORDER — BUPIVACAINE HCL (PF) 0.5 % IJ SOLN
INTRAMUSCULAR | Status: AC
  Filled 2012-02-21: qty 30

## 2012-02-21 MED ORDER — ONDANSETRON HCL 4 MG/2ML IJ SOLN
INTRAMUSCULAR | Status: DC | PRN
  Administered 2012-02-21: 4 mg via INTRAVENOUS

## 2012-02-21 MED ORDER — PROPOFOL 10 MG/ML IV EMUL
INTRAVENOUS | Status: AC
  Filled 2012-02-21: qty 20

## 2012-02-21 MED ORDER — FENTANYL CITRATE 0.05 MG/ML IJ SOLN
INTRAMUSCULAR | Status: AC
  Filled 2012-02-21: qty 4

## 2012-02-21 MED ORDER — PHENYLEPHRINE HCL 10 MG/ML IJ SOLN
INTRAMUSCULAR | Status: DC | PRN
  Administered 2012-02-21 (×2): 40 ug via INTRAVENOUS

## 2012-02-21 MED ORDER — MIDAZOLAM HCL 2 MG/2ML IJ SOLN
INTRAMUSCULAR | Status: AC
  Filled 2012-02-21: qty 2

## 2012-02-21 MED ORDER — FENTANYL CITRATE 0.05 MG/ML IJ SOLN: 25.0000 ug | INTRAMUSCULAR | Status: DC | PRN

## 2012-02-21 MED ORDER — MIDAZOLAM HCL 5 MG/5ML IJ SOLN
INTRAMUSCULAR | Status: DC | PRN
  Administered 2012-02-21: 2 mg via INTRAVENOUS

## 2012-02-21 MED ORDER — DEXAMETHASONE SODIUM PHOSPHATE 4 MG/ML IJ SOLN
INTRAMUSCULAR | Status: DC | PRN
  Administered 2012-02-21: 4 mg via INTRAVENOUS

## 2012-02-21 MED ORDER — KETOROLAC TROMETHAMINE 60 MG/2ML IM SOLN
INTRAMUSCULAR | Status: AC
  Filled 2012-02-21: qty 2

## 2012-02-21 MED ORDER — FENTANYL CITRATE 0.05 MG/ML IJ SOLN
INTRAMUSCULAR | Status: DC | PRN
  Administered 2012-02-21: 50 ug via INTRAVENOUS
  Administered 2012-02-21: 25 ug via INTRAVENOUS

## 2012-02-21 MED ORDER — LACTATED RINGERS IV SOLN
INTRAVENOUS | Status: DC | PRN
  Administered 2012-02-21 (×3): via INTRAVENOUS

## 2012-02-21 MED ORDER — LIDOCAINE HCL (CARDIAC) 20 MG/ML IV SOLN
INTRAVENOUS | Status: AC
  Filled 2012-02-21: qty 5

## 2012-02-21 MED ORDER — DROPERIDOL 2.5 MG/ML IJ SOLN
INTRAMUSCULAR | Status: AC
  Filled 2012-02-21: qty 2

## 2012-02-21 MED ORDER — GLYCINE 1.5 % IR SOLN
Status: DC | PRN
  Administered 2012-02-21: 3000 mL

## 2012-02-21 MED ORDER — LIDOCAINE HCL (CARDIAC) 20 MG/ML IV SOLN
INTRAVENOUS | Status: DC | PRN
  Administered 2012-02-21: 80 mg via INTRAVENOUS

## 2012-02-21 MED ORDER — HYDROCODONE-ACETAMINOPHEN 2.5-500 MG PO TABS: 1.0000 | ORAL_TABLET | Freq: Four times a day (QID) | ORAL | Status: DC | PRN

## 2012-02-21 MED ORDER — PROPOFOL 10 MG/ML IV EMUL
INTRAVENOUS | Status: DC | PRN
  Administered 2012-02-21 (×2): 100 mg via INTRAVENOUS

## 2012-02-21 SURGICAL SUPPLY — 19 items
CANISTER SUCTION 2500CC (MISCELLANEOUS) ×3 IMPLANT
CATH ROBINSON RED A/P 16FR (CATHETERS) ×3 IMPLANT
CLOTH BEACON ORANGE TIMEOUT ST (SAFETY) ×3 IMPLANT
CONTAINER PREFILL 10% NBF 60ML (FORM) ×3 IMPLANT
DILATOR CANAL MILEX (MISCELLANEOUS) IMPLANT
DRAPE PROXIMA HALF (DRAPES) ×3 IMPLANT
ELECT REM PT RETURN 9FT ADLT (ELECTROSURGICAL)
ELECTRODE REM PT RTRN 9FT ADLT (ELECTROSURGICAL) IMPLANT
GLOVE BIO SURGEON STRL SZ 6.5 (GLOVE) ×6 IMPLANT
GLOVE BIOGEL PI IND STRL 7.0 (GLOVE) ×4 IMPLANT
GLOVE BIOGEL PI INDICATOR 7.0 (GLOVE) ×2
GLOVE SURG SS PI 7.0 STRL IVOR (GLOVE) ×6 IMPLANT
GOWN PREVENTION PLUS LG XLONG (DISPOSABLE) ×6 IMPLANT
GOWN STRL REIN XL XLG (GOWN DISPOSABLE) ×3 IMPLANT
LOOP ANGLED CUTTING 22FR (CUTTING LOOP) IMPLANT
PACK HYSTEROSCOPY LF (CUSTOM PROCEDURE TRAY) ×3 IMPLANT
PAD OB MATERNITY 4.3X12.25 (PERSONAL CARE ITEMS) ×3 IMPLANT
TOWEL OR 17X24 6PK STRL BLUE (TOWEL DISPOSABLE) ×6 IMPLANT
WATER STERILE IRR 1000ML POUR (IV SOLUTION) ×3 IMPLANT

## 2012-02-21 NOTE — Anesthesia Postprocedure Evaluation (Signed)
  Anesthesia Post-op Note  Patient: [name]  Procedure(s) Performed: Procedure(s) (LRB): DILATATION AND CURETTAGE (N/A) Patient is awake and responsive. Pain and nausea are reasonably well controlled. Vital signs are stable and clinically acceptable. Oxygen saturation is clinically acceptable. There are no apparent anesthetic complications at this time. Patient is ready for discharge.

## 2012-02-21 NOTE — Anesthesia Preprocedure Evaluation (Addendum)
Anesthesia Evaluation  Patient identified by MRN, date of birth, ID band Patient awake    Reviewed: Allergy & Precautions, H&P , NPO status , Patient's Chart, lab work & pertinent test results, reviewed documented beta blocker date and time   History of Anesthesia Complications Negative for: history of anesthetic complications  Airway Mallampati: I TM Distance: >3 FB Neck ROM: full    Dental  (+) Edentulous Upper and Upper Dentures   Pulmonary neg pulmonary ROS,  breath sounds clear to auscultation  Pulmonary exam normal       Cardiovascular Exercise Tolerance: Good hypertension, On Medications and On Home Beta Blockers DVT (Recurrent CVT/PE - has IVC filter and has been on coumadin (since 1997), stopped Sunday and bridged with lovenox bid.  Last lovenox dose was last night at 7 pm) Rhythm:regular Rate:Normal     Neuro/Psych negative neurological ROS  negative psych ROS   GI/Hepatic Neg liver ROS, GERD-  ,  Endo/Other  Morbid obesity  Renal/GU CRFRenal disease (baseline cr 1.32)  Female GU complaint (PMB)     Musculoskeletal  (+) Arthritis - (hips (have been replaced) and knees), Osteoarthritis,    Abdominal   Peds  Hematology negative hematology ROS (+)   Anesthesia Other Findings spinal not an option due to lovenox last night.    Reproductive/Obstetrics negative OB ROS                           Anesthesia Physical Anesthesia Plan  ASA: III  Anesthesia Plan: General LMA   Post-op Pain Management:    Induction:   Airway Management Planned:   Additional Equipment:   Intra-op Plan:   Post-operative Plan:   Informed Consent: I have reviewed the patients History and Physical, chart, labs and discussed the procedure including the risks, benefits and alternatives for the proposed anesthesia with the patient or authorized representative who has indicated his/her understanding and  acceptance.   Dental Advisory Given  Plan Discussed with: CRNA and Surgeon  Anesthesia Plan Comments:         Anesthesia Quick Evaluation

## 2012-02-21 NOTE — Op Note (Signed)
02/21/2012  2:54 PM  PATIENT:  [name]  66 y.o. female  PRE-OPERATIVE DIAGNOSIS:  Postmenopausal bleeding with thickened endometrium, morbid obesity, BL hip replacement  POST-OPERATIVE DIAGNOSIS:  same  PROCEDURE:  Procedure(s) (LRB): DILATATION AND CURETTAGE (N/A)  SURGEON:  Surgeon(s) and Role:    * Allie Bossier, MD - Primary  PHYSICIAN ASSISTANT:   ASSISTANTS: none   ANESTHESIA:   general  EBL:  Total I/O In: 200 [I.V.:200] Out: 10 [Blood:10]  BLOOD ADMINISTERED:none  DRAINS: none   LOCAL MEDICATIONS USED:  MARCAINE     SPECIMEN:  Source of Specimen:  uterine curettings  DISPOSITION OF SPECIMEN:  PATHOLOGY  COUNTS:  YES  TOURNIQUET:  * No tourniquets in log *  DICTATION: .Dragon Dictation  PLAN OF CARE: Discharge to home after PACU  PATIENT DISPOSITION:  PACU - hemodynamically stable.   Delay start of Pharmacological VTE agent (>24hrs) due to surgical blood loss or risk of bleeding: not applicable  The risks, benefits, and alternatives of surgery were explained, understood, accepted. Consents were signed. She was taken to the operating room and general anesthesia was without complication. An attempt was made to put her legs into stirrups, but with her bilateral hip replacements, the left hip joint did not want to allow all movement further foot to be able to go to the stirrup. We therefore had a assistants elevate her leg, but not as high as it would need to be in the stirrup. We also had to have 2 assistants to hold back the adipose tissue from her thighs in order to allow all visualization of her bed and brought and vagina. Once all the assistants were in place holding all the necessary parts, her vulva and vagina were prepped and draped in the usual fashion. The cervix was finally visualized with the aid of a large speculum. The anterior lip of the cervix was grasped with a single-tooth tenaculum. Because of the stenotic nature of her cervix, and os finder  was used to locate the cervical os once the os was dilated, the uterine cavity was measured to 10 cm. The cervix was then easily dilated to accommodate a sharp curette. Curettage was done in all quadrants and the fundus of the uterus until a gritty sensation was appreciated throughout. Because I felt that it would be detrimental to have her legs and possibly uncomfortable positions for any longer than necessary, I elected to not do a hysteroscopy. The tenaculum was removed and the anterior lip of the cervix and hemostasis was noted. She was taken to the recovery room in stable condition with the instrument, sponge, needle count correct.

## 2012-02-21 NOTE — Preoperative (Signed)
Beta Blockers   Reason not to administer Beta Blockers:Patient took medication as prescribed at home this a.m.

## 2012-02-21 NOTE — Transfer of Care (Signed)
Immediate Anesthesia Transfer of Care Note  Patient: [name]  Procedure(s) Performed: Procedure(s) (LRB): DILATATION AND CURETTAGE (N/A)  Patient Location: PACU  Anesthesia Type: General  Level of Consciousness: patient cooperative  Airway & Oxygen Therapy: Patient Spontanous Breathing, nasal cannula oxygen  Post-op Assessment: Report given to PACU RN and Post -op Vital signs reviewed and stable  Post vital signs: Reviewed and stable  Complications: No apparent anesthesia complications

## 2012-02-21 NOTE — H&P (Signed)
  66 yo W AA P2 who started with PMB 4/13. She had "a full period" in June. An u/s showed endometrial thickening of 2.9 cm. A embx was attempted in the office, but her cervix was stenotic and the procedure was not accomplished.  PMH- kidney disease           PE x 4           DJD, GERD, obesity, HTN             PSH- BL hip replacement, BTL  NKDA  SH- no smoking, etoh, drugs,  FH- no breast, gyn, colon cancer  ROS- widowed  PE  [facility] AAF NAD  Heart- rrr Lungs- CTAB Abd- benign  A/P.PMB- plan for h/s, d&c I have spoken with her pulmonologist's office regarding her f/u with her lovenox/coumadin.

## 2012-02-24 ENCOUNTER — Telehealth: Payer: Self-pay | Admitting: Family Medicine

## 2012-02-24 ENCOUNTER — Ambulatory Visit (INDEPENDENT_AMBULATORY_CARE_PROVIDER_SITE_OTHER): Payer: Medicare Other | Admitting: Critical Care Medicine

## 2012-02-24 ENCOUNTER — Encounter: Payer: Self-pay | Admitting: Critical Care Medicine

## 2012-02-24 VITALS — BP 122/72 | HR 72 | Temp 98.4°F | Ht 65.0 in | Wt 244.0 lb

## 2012-02-24 DIAGNOSIS — I2699 Other pulmonary embolism without acute cor pulmonale: Secondary | ICD-10-CM

## 2012-02-24 MED ORDER — WARFARIN SODIUM 5 MG PO TABS: ORAL_TABLET | ORAL | Status: DC

## 2012-02-24 MED ORDER — RIVAROXABAN 20 MG PO TABS: 20.0000 mg | ORAL_TABLET | Freq: Every day | ORAL | Status: DC

## 2012-02-24 NOTE — Patient Instructions (Addendum)
Check with your pharmacy on Xarelto cost.  IF too high, call us for patient assistance STay on coumadin until you start Xarelto, stop coumadin day you start xarelto Keep appt with coumadin clinic Stop lovenox Return 4 months [facility] office

## 2012-02-24 NOTE — Telephone Encounter (Signed)
Ms. Bouldin saw her pulmonologist this am and he want provider to change patient from the Coumadin to Zorelto 20mg .  Please send rx to [facility] and let patient know when sent.

## 2012-02-24 NOTE — Telephone Encounter (Signed)
It looks like Dr. Delford Field has already sent this in.

## 2012-02-24 NOTE — Progress Notes (Signed)
  Subjective:    Patient ID: [name], female    DOB: July 05, 1946, 66 y.o.   MRN: [id number]  HPI  66 yo AAF wiht prior hx of Pulmonary embollii in 1997 with recurrence in 2005 when coumadin dosing was adjusted and INR fell. Pt had IVC filter placed in 2005 with bleeding complications. Pt did well and has been on coumadin since that time.  Pt now in [facility] clinic and being followed. 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 Pt is limited in ambulation.   There have not been any issues with coumadin dosing or bleeding recently   7/15  Pt is scheduled to have d&c/hysterectomy on 02/21/12 over at [facility].  Developed post-menopausal bleeding , pelvic US showed cystic endometrium, biopsy could not be performed.Pt states her breathing has been good. denies any wheezing, chest tx, cough INR 2.7 on 02/10/12  02/24/2012 Pt had D and C without complications.   Now the question ? Of Xarelto. No active issues. The patient's breathing is stable. There is no chest discomfort wheezing or cough. Pt denies any significant sore throat, nasal congestion or excess secretions, fever, chills, sweats, unintended weight loss, pleurtic or exertional chest pain, orthopnea PND, or leg swelling Pt denies any increase in rescue therapy over baseline, denies waking up needing it or having any early am or nocturnal exacerbations of coughing/wheezing/or dyspnea. Pt also denies any obvious fluctuation in symptoms with  weather or environmental change or other alleviating or aggravating factors     Past Medical History  Diagnosis Date  . Hyperlipidemia   . Hypertension   . 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 Recurrent pulmonary embolism   . Obesity (BMI 30-39.9)   . DJD  (degenerative joint disease) of hip   . Sprain and strain of wrist   . CKD (chronic kidney disease), stage III     pt doesn't know why this is listed  . GERD (gastroesophageal reflux disease)     h/o in the 1990s   No Known Allergies  Review of Systems  Patient denies significant dyspnea,cough, hemoptysis,  chest pain, palpitations, pedal edema, orthopnea, paroxysmal nocturnal dyspnea, lightheadedness, nausea, vomiting, abdominal or  leg pains      Objective:   Physical Exam   Gen. Pleasant, well-nourished, in no distress, normal affect ENT - no lesions, no post nasal drip Neck: No JVD, no thyromegaly, no carotid bruits Lungs: no use of accessory muscles, no dullness to percussion, clear without rales or rhonchi  Cardiovascular: Rhythm regular, heart sounds  normal, no murmurs or gallops, 2+ peripheral edema Abdomen: soft and non-tender, no hepatosplenomegaly, BS normal. Musculoskeletal: No deformities, no cyanosis or clubbing Neuro:  alert, non focal       Assessment & Plan:   PULMONARY EMBOLISM History of chronic deep venous thrombosis and pulmonary embolism. Patient is now on lifelong Coumadin therapy. The patient is interested in switching over to Rivaroxaban.  She is now back on Coumadin status post D&C. There were no complications from the [facility] Plan Referral back to family practice Coumadin clinic to transition over to Xarelto. Prescription was given for 20 mg daily dosage. The patient's renal function is not severe enough for dose reduction to be necessary.

## 2012-02-24 NOTE — Assessment & Plan Note (Signed)
History of chronic deep venous thrombosis and pulmonary embolism. Patient is now on lifelong Coumadin therapy. The patient is interested in switching over to Rivaroxaban.  She is now back on Coumadin status post D&C. There were no complications from the [facility] Plan Referral back to family practice Coumadin clinic to transition over to Xarelto. Prescription was given for 20 mg daily dosage. The patient's renal function is not severe enough for dose reduction to be necessary.

## 2012-03-05 ENCOUNTER — Telehealth: Payer: Self-pay | Admitting: Family Medicine

## 2012-03-05 MED ORDER — PHYTONADIONE 5 MG PO TABS: 1.2500 mg | ORAL_TABLET | Freq: Once | ORAL | Status: DC

## 2012-03-05 NOTE — Telephone Encounter (Signed)
States  symptoms  started about 5 hours ago.    Dizziness came on all of a sudden. Also has headache. Advised patient that she needs to be evaluated today and advised to go to Urgent Care now. She will  call her son to come and take her now.

## 2012-03-05 NOTE — Addendum Note (Signed)
Addended by: [place], Marjorie Deprey on: 03/05/2012 01:57 PM   Modules accepted: Orders

## 2012-03-05 NOTE — Telephone Encounter (Signed)
Patient is calling because she has been dizzy for the last 4 hours.  She does have blood pressure problems, but she has taken her meds.  She doesn't want to schedule an appt, she wants to know if there is anything she can take over the counter.

## 2012-03-06 NOTE — Telephone Encounter (Signed)
Pt calling to let us know that she did not go to UC - she started feeling better so she is calling to let us know.  Wanted to let nurse

## 2012-03-12 ENCOUNTER — Ambulatory Visit (INDEPENDENT_AMBULATORY_CARE_PROVIDER_SITE_OTHER): Payer: Medicare Other | Admitting: Obstetrics & Gynecology

## 2012-03-12 VITALS — BP 123/74 | HR 64 | Temp 98.3°F | Ht 62.5 in | Wt 242.5 lb

## 2012-03-12 DIAGNOSIS — Z0000 Encounter for general adult medical examination without abnormal findings: Secondary | ICD-10-CM

## 2012-03-12 DIAGNOSIS — Z1231 Encounter for screening mammogram for malignant neoplasm of breast: Secondary | ICD-10-CM

## 2012-03-12 DIAGNOSIS — N950 Postmenopausal bleeding: Secondary | ICD-10-CM

## 2012-03-12 DIAGNOSIS — Z09 Encounter for follow-up examination after completed treatment for conditions other than malignant neoplasm: Secondary | ICD-10-CM

## 2012-03-12 DIAGNOSIS — Z9889 Other specified postprocedural states: Secondary | ICD-10-CM

## 2012-03-12 NOTE — Progress Notes (Signed)
  Subjective:    Patient ID: [name], female    DOB: 12-28-1945, 66 y.o.   MRN: [id number]  HPI  She is post op and doing well. She had some spotting for about 3 days post op. She has no problems.  Review of Systems  Pathology negative    Objective:   Physical Exam        Assessment & Plan:  PMB- benign pathology RTC 1 year/prn sooner Schedule mammogram today

## 2012-03-27 ENCOUNTER — Ambulatory Visit (HOSPITAL_COMMUNITY): Admission: RE | Admit: 2012-03-27 | Payer: Medicare Other | Source: Ambulatory Visit

## 2012-04-30 ENCOUNTER — Ambulatory Visit (HOSPITAL_COMMUNITY)
Admission: RE | Admit: 2012-04-30 | Discharge: 2012-04-30 | Disposition: A | Discharge status: Home / Self Care | Payer: Medicare Other | Source: Ambulatory Visit | Attending: Obstetrics & Gynecology | Admitting: Obstetrics & Gynecology

## 2012-04-30 DIAGNOSIS — Z1231 Encounter for screening mammogram for malignant neoplasm of breast: Secondary | ICD-10-CM | POA: Insufficient documentation

## 2012-05-05 ENCOUNTER — Encounter: Payer: Self-pay | Admitting: Family Medicine

## 2012-05-05 ENCOUNTER — Ambulatory Visit (INDEPENDENT_AMBULATORY_CARE_PROVIDER_SITE_OTHER): Payer: Medicare Other | Admitting: Family Medicine

## 2012-05-05 VITALS — BP 137/85 | HR 73 | Ht 62.5 in | Wt 248.0 lb

## 2012-05-05 DIAGNOSIS — R200 Anesthesia of skin: Secondary | ICD-10-CM

## 2012-05-05 DIAGNOSIS — R209 Unspecified disturbances of skin sensation: Secondary | ICD-10-CM

## 2012-05-05 DIAGNOSIS — M25512 Pain in left shoulder: Secondary | ICD-10-CM

## 2012-05-05 DIAGNOSIS — E785 Hyperlipidemia, unspecified: Secondary | ICD-10-CM

## 2012-05-05 DIAGNOSIS — M25519 Pain in unspecified shoulder: Secondary | ICD-10-CM

## 2012-05-05 DIAGNOSIS — I10 Essential (primary) hypertension: Secondary | ICD-10-CM

## 2012-05-05 DIAGNOSIS — M25511 Pain in right shoulder: Secondary | ICD-10-CM | POA: Insufficient documentation

## 2012-05-05 DIAGNOSIS — I2699 Other pulmonary embolism without acute cor pulmonale: Secondary | ICD-10-CM

## 2012-05-05 LAB — COMPREHENSIVE METABOLIC PANEL
ALT: 8 U/L (ref 0–35)
AST: 17 U/L (ref 0–37)
Albumin: 4.3 g/dL (ref 3.5–5.2)
Alkaline Phosphatase: 65 U/L (ref 39–117)
BUN: 28 mg/dL — ABNORMAL HIGH (ref 6–23)
CO2: 26 mEq/L (ref 19–32)
Calcium: 9.9 mg/dL (ref 8.4–10.5)
Chloride: 103 mEq/L (ref 96–112)
Creat: 1.36 mg/dL — ABNORMAL HIGH (ref 0.50–1.10)
Glucose, Bld: 86 mg/dL (ref 70–99)
Potassium: 4.2 mEq/L (ref 3.5–5.3)
Sodium: 141 mEq/L (ref 135–145)
Total Bilirubin: 0.4 mg/dL (ref 0.3–1.2)
Total Protein: 7.4 g/dL (ref 6.0–8.3)

## 2012-05-05 LAB — LDL CHOLESTEROL, DIRECT: Direct LDL: 151 mg/dL — ABNORMAL HIGH

## 2012-05-05 MED ORDER — FUROSEMIDE 20 MG PO TABS: 20.0000 mg | ORAL_TABLET | Freq: Every day | ORAL | Status: DC

## 2012-05-05 MED ORDER — TRAMADOL HCL 50 MG PO TABS: 50.0000 mg | ORAL_TABLET | Freq: Two times a day (BID) | ORAL | Status: DC | PRN

## 2012-05-05 NOTE — Assessment & Plan Note (Signed)
BP looks fairly good today at 137/85.  Will go ahead and restart her lasix as this will give her a little better control.  Discussed avoiding salty foods including canned, fast foods, etc.

## 2012-05-05 NOTE — Progress Notes (Signed)
  Subjective:    Patient ID: [name], female    DOB: [birth year], 66 y.o.   MRN: [id number]  HPI Here for follow up of chronic medical issues and meet new physician  1.  HTN: CHRONIC HYPERTENSION:  She states she is doing well.  She feels like she hold onto a lot of fluid that affects her BP.  She states she has not been receiving refills on her lasix.  Disease Monitoring  Blood pressure range: Does not typically monitor at home  Chest pain: no   Dyspnea: no   Claudication: no   Medication compliance: yes  Medication Side Effects  Lightheadedness: no   Urinary frequency: no   Edema: no   Preventitive Healthcare:  Exercise: yes, she is trying to walk but ambulates with a cane 2/2 to bilateral hip replacement   Diet Pattern: Pretty good diet.  She tries to limit salt and fats.  She eats a good variety of fruits  and vegetables    2.  Arm pain:  Bilateral arm pain L>R for about a year.  She has pain that radiates from her posterior shoulder all the way to the elbow.  Pain sometimes radiates past elbow depending on what she is doing.  She does also have numbness in her fingertips and often has difficulty manipulating small objects due to this.    3. HLD: Low compliance in the past with lipitor so discontinued..  She has been working on diet and exercise to improve her lipid profile.  She would be willing to start another lipid lowering medication if needed.   Review of Systems Per HPI    Objective:   Physical Exam  Constitutional: She appears well-nourished. No distress.  HENT:  Head: Normocephalic and atraumatic.  Neck: Neck supple.       ROM limited in sidebending and full extension. Negative spurlings bilaterally  Cardiovascular: Normal rate, regular rhythm and normal heart sounds.   Pulmonary/Chest: Effort normal and breath sounds normal.  Musculoskeletal: She exhibits no edema.       Grip strength 5/5.  5/5 with flexion and extension of arm.   Negative impingement  signs   Neurological: She is alert.          Assessment & Plan:

## 2012-05-05 NOTE — Assessment & Plan Note (Signed)
Pain does not seem to be in her shoulder joint or surrounding structures.  Unable to reproduce pain today.  Shoulder pain along with paraesthesias in the fingers could indicate DJD or DDD of the c spine.  Will start her on tramadol for pain control and get plain films of neck to evaluate c spine.

## 2012-05-05 NOTE — Assessment & Plan Note (Signed)
Poor compliance with lipitor in the past.  Check D-LDL today.  If significantly elevated we discussed starting on a different medication.

## 2012-05-05 NOTE — Patient Instructions (Signed)
Thank you for coming in today, it was nice to meet you We are checking your cholesterol, liver and kidney function today I would like for you to try the tramadol as needed for pain Please go get the x-rays of your neck Your blood pressure looked pretty good today at 137/85.  I have refilled your lasix.   I will see you back in 6-8 weeks to see how the medication is working for your pain.

## 2012-05-08 ENCOUNTER — Ambulatory Visit
Admission: RE | Admit: 2012-05-08 | Discharge: 2012-05-08 | Disposition: A | Discharge status: Home / Self Care | Payer: Medicare Other | Source: Ambulatory Visit | Attending: Family Medicine | Admitting: Family Medicine

## 2012-05-08 DIAGNOSIS — R200 Anesthesia of skin: Secondary | ICD-10-CM

## 2012-05-11 ENCOUNTER — Telehealth: Payer: Self-pay | Admitting: Family Medicine

## 2012-05-11 NOTE — Telephone Encounter (Signed)
Please let patient know that neck x rays show multiple areas of degeneration that could be leading to her pain.  Please have her make an appointment with me for [place] to discuss next course of action for her.

## 2012-05-12 NOTE — Telephone Encounter (Signed)
Called pt. Informed. Pt agreed. .[name]  

## 2012-05-18 ENCOUNTER — Ambulatory Visit (INDEPENDENT_AMBULATORY_CARE_PROVIDER_SITE_OTHER): Payer: Medicare Other | Admitting: Family Medicine

## 2012-05-18 ENCOUNTER — Encounter: Payer: Self-pay | Admitting: Family Medicine

## 2012-05-18 VITALS — BP 145/81 | HR 66 | Ht 62.5 in | Wt 250.0 lb

## 2012-05-18 DIAGNOSIS — M25512 Pain in left shoulder: Secondary | ICD-10-CM

## 2012-05-18 DIAGNOSIS — E785 Hyperlipidemia, unspecified: Secondary | ICD-10-CM

## 2012-05-18 DIAGNOSIS — M25519 Pain in unspecified shoulder: Secondary | ICD-10-CM

## 2012-05-18 DIAGNOSIS — M25511 Pain in right shoulder: Secondary | ICD-10-CM

## 2012-05-18 MED ORDER — PREDNISONE 50 MG PO TABS: 50.0000 mg | ORAL_TABLET | Freq: Every day | ORAL | Status: DC

## 2012-05-18 NOTE — Patient Instructions (Signed)
I have sent in a medication called prednisone for your neck, I am also going to send over a  Referral to physical therapy.  I will see you back in 6-8 weeks to see if this improving.    Cervical Radiculopathy Cervical radiculopathy happens when a nerve in the neck is pinched or bruised by a slipped (herniated) disk or by arthritic changes in the bones of the cervical spine. This can occur due to an injury or as part of the normal aging process. Pressure on the cervical nerves can cause pain or numbness that runs from your neck all the way down into your arm and fingers. CAUSES  There are many possible causes, including:  Injury.  Muscle tightness in the neck from overuse.  Swollen, painful joints (arthritis).  Breakdown or degeneration in the bones and joints of the spine (spondylosis) due to aging.  Bone spurs that may develop near the cervical nerves. SYMPTOMS  Symptoms include pain, weakness, or numbness in the affected arm and hand. Pain can be severe or irritating. Symptoms may be worse when extending or turning the neck. DIAGNOSIS  Your caregiver will ask about your symptoms and do a physical exam. He or she may test your strength and reflexes. X-rays, CT scans, and MRI scans may be needed in cases of injury or if the symptoms do not go away after a period of time. Electromyography (EMG) or nerve conduction testing may be done to study how your nerves and muscles are working. TREATMENT  Your caregiver may recommend certain exercises to help relieve your symptoms. Cervical radiculopathy can, and often does, get better with time and treatment. If your problems continue, treatment options may include:  Wearing a soft collar for short periods of time.  Physical therapy to strengthen the neck muscles.  Medicines, such as nonsteroidal anti-inflammatory drugs (NSAIDs), oral corticosteroids, or spinal injections.  Surgery. Different types of surgery may be done depending on the cause of  your problems. HOME CARE INSTRUCTIONS   Put ice on the affected area.  Put ice in a plastic bag.  Place a towel between your skin and the bag.  Leave the ice on for 15 to 20 minutes, 3 to 4 times a day or as directed by your caregiver.  If ice does not help, you can try using heat. Take a warm shower or bath, or use a hot water bottle as directed by your caregiver.  You may try a gentle neck and shoulder massage.  Use a flat pillow when you sleep.  Only take over-the-counter or prescription medicines for pain, discomfort, or fever as directed by your caregiver.  If physical therapy was prescribed, follow your caregiver's directions.  If a soft collar was prescribed, use it as directed. SEEK IMMEDIATE MEDICAL CARE IF:   Your pain gets much worse and cannot be controlled with medicines.  You have weakness or numbness in your hand, arm, face, or leg.  You have a high fever or a stiff, rigid neck.  You lose bowel or bladder control (incontinence).  You have trouble with walking, balance, or speaking. MAKE SURE YOU:   Understand these instructions.  Will watch your condition.  Will get help right away if you are not doing well or get worse. Document Released: 04/16/2001 Document Revised: 10/14/2011 Document Reviewed: 03/05/2011 [facility] [place] Patient Information 2013 [place], [place].

## 2012-05-19 NOTE — Progress Notes (Signed)
  Subjective:    Patient ID: [name], female    DOB: 08-28-1945, 66 y.o.   MRN: [id number]  HPI  1. Neck/shoulder pain:  Continued neck and shoulder pain bilaterally.  Sometimes worse after sleeping, depending on positioning.   Still with numbness and tingling in fingertips.  Had cervical films recently that showed spondylosis and DDD.  She has not picked up tramadol for pain control yet.    Review of Systems Denies fever, chills, shortness of breath, chest pain, palpitations, weakness in arms.    Objective:   Physical Exam  Constitutional: She appears well-nourished. No distress.  HENT:  Head: Normocephalic and atraumatic.  Neurological: She is alert.          Assessment & Plan:

## 2012-05-19 NOTE — Assessment & Plan Note (Signed)
C spine films with DDD and anterolisthesis with 3mm slippage.   This could be placing pressure on neural foramina.  Discussed options including obtaining MRI for further evaluation, referral for neurosx or conservative tx with PT and steroid burst.  She elects for conservative tx with PT and trial of steroids.  She will f/u in 6-8 weeks

## 2012-05-19 NOTE — Assessment & Plan Note (Signed)
LDL 150, discussed dietary changes and increasing exercising to improve this.  Reviewed goal for her is ldl < 130

## 2012-05-21 ENCOUNTER — Ambulatory Visit: Payer: Medicare Other | Admitting: Physical Therapy

## 2012-05-27 ENCOUNTER — Ambulatory Visit: Payer: Medicare Other | Attending: Family Medicine | Admitting: Physical Therapy

## 2012-05-27 DIAGNOSIS — M2569 Stiffness of other specified joint, not elsewhere classified: Secondary | ICD-10-CM | POA: Insufficient documentation

## 2012-05-27 DIAGNOSIS — IMO0001 Reserved for inherently not codable concepts without codable children: Secondary | ICD-10-CM | POA: Insufficient documentation

## 2012-05-27 DIAGNOSIS — M25519 Pain in unspecified shoulder: Secondary | ICD-10-CM | POA: Insufficient documentation

## 2012-06-01 ENCOUNTER — Encounter: Payer: Medicare Other | Admitting: Physical Therapy

## 2012-06-03 ENCOUNTER — Ambulatory Visit: Payer: Medicare Other | Admitting: Physical Therapy

## 2012-06-08 ENCOUNTER — Ambulatory Visit: Payer: Medicare Other | Attending: Family Medicine | Admitting: Physical Therapy

## 2012-06-08 DIAGNOSIS — IMO0001 Reserved for inherently not codable concepts without codable children: Secondary | ICD-10-CM | POA: Insufficient documentation

## 2012-06-08 DIAGNOSIS — M25519 Pain in unspecified shoulder: Secondary | ICD-10-CM | POA: Insufficient documentation

## 2012-06-08 DIAGNOSIS — M2569 Stiffness of other specified joint, not elsewhere classified: Secondary | ICD-10-CM | POA: Insufficient documentation

## 2012-06-10 ENCOUNTER — Ambulatory Visit: Payer: Medicare Other | Admitting: Physical Therapy

## 2012-06-15 ENCOUNTER — Encounter: Payer: Medicare Other | Admitting: Physical Therapy

## 2012-06-16 ENCOUNTER — Encounter: Payer: Self-pay | Admitting: Critical Care Medicine

## 2012-06-16 ENCOUNTER — Ambulatory Visit (INDEPENDENT_AMBULATORY_CARE_PROVIDER_SITE_OTHER): Payer: Medicare Other | Admitting: Critical Care Medicine

## 2012-06-16 VITALS — BP 130/68 | HR 73 | Temp 98.1°F | Ht 62.5 in | Wt 250.8 lb

## 2012-06-16 DIAGNOSIS — I2699 Other pulmonary embolism without acute cor pulmonale: Secondary | ICD-10-CM

## 2012-06-16 NOTE — Progress Notes (Signed)
  Subjective:    Patient ID: [name], female    DOB: 1945-08-21, 66 y.o.   MRN: [id number]  HPI  66 y.o.AAF wiht prior hx of Pulmonary embollii in 1997 with recurrence in 2005 when coumadin dosing was adjusted and INR fell. Pt had IVC filter placed in 2005 with bleeding complications. Pt did well and has been on coumadin since that time.  Pt now in [facility] clinic and being followed. 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 Pt is limited in ambulation.  The patient was switched to Xarelto  this past summer 2013  06/16/2012 Now on xarelto and doing well  No issues so far.  No chest pain.  No dyspnea  Pt denies any significant sore throat, nasal congestion or excess secretions, fever, chills, sweats, unintended weight loss, pleurtic or exertional chest pain, orthopnea PND, or leg swelling Pt denies any increase in rescue therapy over baseline, denies waking up needing it or having any early am or nocturnal exacerbations of coughing/wheezing/or dyspnea. Pt also denies any obvious fluctuation in symptoms with  weather or environmental change or other alleviating or aggravating factors      Past Medical History  Diagnosis Date  . Hyperlipidemia   . Hypertension   . 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 Recurrent pulmonary embolism   . Obesity (BMI 30-39.9)   . DJD (degenerative joint disease) of hip   . Sprain and strain of wrist   . CKD (chronic kidney disease), stage III     pt doesn't know why this is listed  . GERD (gastroesophageal reflux disease)     h/o in the 1990s   No Known Allergies  Review of Systems  Patient denies significant dyspnea,cough, hemoptysis,  chest pain, palpitations, pedal edema, orthopnea, paroxysmal nocturnal  dyspnea, lightheadedness, nausea, vomiting, abdominal or  leg pains      Objective:   Physical Exam   Gen. Pleasant, well-nourished, in no distress, normal affect ENT - no lesions, no post nasal drip Neck: No JVD, no thyromegaly, no carotid bruits Lungs: no use of accessory muscles, no dullness to percussion, clear without rales or rhonchi  Cardiovascular: Rhythm regular, heart sounds  normal, no murmurs or gallops, 2+ peripheral edema Abdomen: soft and non-tender, no hepatosplenomegaly, BS normal. Musculoskeletal: No deformities, no cyanosis or clubbing Neuro:  alert, non focal       Assessment & Plan:   PULMONARY EMBOLISM History of recurrent venous thromboembolic disease now on lifelong anticoagulation with Xarelto. Stable at this time Plan Maintains Xarelto Return 6 months

## 2012-06-16 NOTE — Assessment & Plan Note (Signed)
History of recurrent venous thromboembolic disease now on lifelong anticoagulation with Xarelto. Stable at this time Plan Maintains Xarelto Return 6 months

## 2012-06-16 NOTE — Patient Instructions (Signed)
No change in medications. Return in        6 months        

## 2012-06-17 ENCOUNTER — Ambulatory Visit: Payer: Medicare Other | Admitting: Physical Therapy

## 2012-06-22 ENCOUNTER — Encounter: Payer: Medicare Other | Admitting: Physical Therapy

## 2012-06-24 ENCOUNTER — Encounter: Payer: Medicare Other | Admitting: Physical Therapy

## 2012-06-25 ENCOUNTER — Other Ambulatory Visit: Payer: Self-pay | Admitting: Family Medicine

## 2012-06-29 ENCOUNTER — Encounter: Payer: Medicare Other | Admitting: Physical Therapy

## 2012-07-01 ENCOUNTER — Ambulatory Visit (INDEPENDENT_AMBULATORY_CARE_PROVIDER_SITE_OTHER): Payer: Medicare Other | Admitting: Family Medicine

## 2012-07-01 ENCOUNTER — Encounter: Payer: Medicare Other | Admitting: Physical Therapy

## 2012-07-01 ENCOUNTER — Encounter: Payer: Self-pay | Admitting: Family Medicine

## 2012-07-01 VITALS — BP 159/82 | HR 78 | Ht 62.5 in | Wt 251.9 lb

## 2012-07-01 DIAGNOSIS — M25511 Pain in right shoulder: Secondary | ICD-10-CM

## 2012-07-01 DIAGNOSIS — M25512 Pain in left shoulder: Secondary | ICD-10-CM

## 2012-07-01 DIAGNOSIS — M25519 Pain in unspecified shoulder: Secondary | ICD-10-CM

## 2012-07-01 MED ORDER — CYCLOBENZAPRINE HCL 10 MG PO TABS: 10.0000 mg | ORAL_TABLET | Freq: Three times a day (TID) | ORAL | Status: DC | PRN

## 2012-07-01 NOTE — Patient Instructions (Signed)
Thank you for coming in today, it was good to see you Use the flexeril as needed for spasm. This may make you drowsy so you may want to start using only at night. Follow up with me in 2-3 weeks.

## 2012-07-05 NOTE — Progress Notes (Signed)
  Subjective:    Patient ID: [name], female    DOB: Jun 06, 1946, 66 y.o.   MRN: [id number]  HPI 1.  F/u Neck/Shoulder pain:  Here for f/u of neck and shoulder pain.  Sounded like radicular pain during previous visit.  Given trial of steroids with not much relief of symptoms.  She also has been using tramadol which does help some.  She did attend some of her PT sessions but had difficulty making most of them, and discharged herself.  She felt like this did help a lot.  She reports that she still has some numbness in the tips of the fingers bilaterally, R>L.  She denies any muscle weakness, fevers, chills, chest pain.    Review of Systems Per HPI    Objective:   Physical Exam  Constitutional: She appears well-nourished. No distress.  Neck: Neck supple.       ROM limited in sidebending and full extension.   Musculoskeletal:       Shoulder exam with no signs of impingment bilaterally.   She does have spasm of the upper trapezius bilaterally.          Assessment & Plan:

## 2012-07-05 NOTE — Assessment & Plan Note (Signed)
Has quite of spasm in upper trapezius that may be contributing to symptoms.  Will have her try short trial of flexeril to see if this is helpful for her pain.  If not improving may need to obtain MRI vs going ahead and referring to neurosx for further evaluation given numbness in fingers and finding on plain films.

## 2012-07-23 ENCOUNTER — Ambulatory Visit (INDEPENDENT_AMBULATORY_CARE_PROVIDER_SITE_OTHER): Payer: Medicare Other | Admitting: Family Medicine

## 2012-07-23 VITALS — BP 142/83 | HR 72 | Temp 98.1°F | Ht 62.5 in | Wt 250.0 lb

## 2012-07-23 DIAGNOSIS — M5410 Radiculopathy, site unspecified: Secondary | ICD-10-CM | POA: Insufficient documentation

## 2012-07-23 DIAGNOSIS — M5412 Radiculopathy, cervical region: Secondary | ICD-10-CM

## 2012-07-23 HISTORY — DX: Radiculopathy, site unspecified: M54.10

## 2012-07-23 MED ORDER — GABAPENTIN 100 MG PO CAPS: ORAL_CAPSULE | ORAL | Status: DC

## 2012-07-23 NOTE — Patient Instructions (Addendum)
Thank you for coming in today, it was good to see you Try the low dose gabapentin to see if this is helpful They will call to schedule your MRI. I will call you when I get the results back

## 2012-07-27 ENCOUNTER — Ambulatory Visit (HOSPITAL_COMMUNITY)
Admission: RE | Admit: 2012-07-27 | Discharge: 2012-07-27 | Disposition: A | Discharge status: Home / Self Care | Payer: Medicare Other | Source: Ambulatory Visit | Attending: Family Medicine | Admitting: Family Medicine

## 2012-07-27 DIAGNOSIS — M5412 Radiculopathy, cervical region: Secondary | ICD-10-CM | POA: Insufficient documentation

## 2012-07-27 DIAGNOSIS — M5410 Radiculopathy, site unspecified: Secondary | ICD-10-CM

## 2012-07-27 DIAGNOSIS — M25519 Pain in unspecified shoulder: Secondary | ICD-10-CM | POA: Insufficient documentation

## 2012-07-30 NOTE — Progress Notes (Signed)
  Subjective:    Patient ID: [name], female    DOB: 11/19/1945, 66 y.o.   MRN: [id number]  HPI 1. Shoulder pain:  Here for follow up of neck and shoulder pain.  First visit for this issue was in October.  Pain has been unchanged.  Has tried conservative therapy with steroids, tramadol, flexeril and physical therapy (although only went for a few sessions).  Cervical xray with multilevel DDD.  She still has numbness of fingertips of R hand.  Denies weakness of the hand or arm.    Review of Systems Per HPI    Objective:   Physical Exam  Constitutional: She appears well-nourished. No distress.  Musculoskeletal:       Spasm of upper trapezius present.   + spurling on R Strength 5/5 in RUE and LUE Some parasthesias in R fingers compared to Left.          Assessment & Plan:

## 2012-07-30 NOTE — Assessment & Plan Note (Signed)
Radicular signs on R side with numbness of fingertips.  She has failed conservative therapy at this point.  WIll send for MRI of neck for further evaluation.  Will also start low dose gabapentin to help with pain control until results return for MRI.

## 2012-08-06 ENCOUNTER — Telehealth: Payer: Self-pay | Admitting: Family Medicine

## 2012-08-06 DIAGNOSIS — R9389 Abnormal findings on diagnostic imaging of other specified body structures: Secondary | ICD-10-CM

## 2012-08-06 DIAGNOSIS — M5410 Radiculopathy, site unspecified: Secondary | ICD-10-CM

## 2012-08-06 NOTE — Telephone Encounter (Signed)
Called patient to give her results of MRI.  Left VM to call our clinic, but told her that I would go ahead and place referral to neurosurgery.

## 2012-08-07 NOTE — Telephone Encounter (Signed)
Pt returned call about her results - told her that Ashley Royalty was out today, but she is hoping someone could call her to give her info.

## 2012-08-10 ENCOUNTER — Telehealth: Payer: Self-pay | Admitting: Family Medicine

## 2012-08-10 NOTE — Telephone Encounter (Signed)
Patient is returning call to Dr. Ashley Royalty from last Thursday.  She is hoping that he can call her back before he starts clinic this morning.

## 2012-08-10 NOTE — Telephone Encounter (Signed)
Spoke with patient about MRI results.  Proceed with Neurosurgery referral as planned.

## 2012-08-18 ENCOUNTER — Other Ambulatory Visit: Payer: Self-pay | Admitting: Neurosurgery

## 2012-08-18 DIAGNOSIS — M25511 Pain in right shoulder: Secondary | ICD-10-CM

## 2012-08-18 DIAGNOSIS — M545 Low back pain: Secondary | ICD-10-CM

## 2012-08-27 ENCOUNTER — Ambulatory Visit
Admission: RE | Admit: 2012-08-27 | Discharge: 2012-08-27 | Disposition: A | Discharge status: Home / Self Care | Payer: Medicare Other | Source: Ambulatory Visit | Attending: Neurosurgery | Admitting: Neurosurgery

## 2012-08-27 ENCOUNTER — Telehealth: Payer: Self-pay | Admitting: Family Medicine

## 2012-08-27 ENCOUNTER — Other Ambulatory Visit: Payer: Self-pay | Admitting: Neurosurgery

## 2012-08-27 DIAGNOSIS — M545 Low back pain: Secondary | ICD-10-CM

## 2012-08-27 DIAGNOSIS — M25511 Pain in right shoulder: Secondary | ICD-10-CM

## 2012-08-27 NOTE — Telephone Encounter (Signed)
Patient dropped off form to be filled out for social services.  Please call her when completed. °

## 2012-08-27 NOTE — Telephone Encounter (Signed)
Looks like she will need PPD according to form.  Remainder of form completed and returned to [place].

## 2012-08-27 NOTE — Telephone Encounter (Addendum)
Patient notified of message from Dr. Ashley Royalty. 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  Dr. Ashley Royalty notified and he added note to form.

## 2012-08-27 NOTE — Telephone Encounter (Signed)
Medical Evaluation for [place] [employer] placed in Dr. Anastasio Auerbach box for completion.  Ileana Ladd

## 2012-08-27 NOTE — Telephone Encounter (Signed)
Needs asap - just found out that she needs to have this done before Monday - needs it to be faxed to [facility] - [phone] Her phn #  Is [phone]

## 2012-08-31 ENCOUNTER — Telehealth: Payer: Self-pay | Admitting: Critical Care Medicine

## 2012-08-31 ENCOUNTER — Other Ambulatory Visit: Payer: Self-pay | Admitting: Neurosurgery

## 2012-08-31 NOTE — Telephone Encounter (Signed)
Pt advised. Arul Farabee, CMA  

## 2012-08-31 NOTE — Telephone Encounter (Signed)
Stop xarelto TWO days before surgery. Resume 1 day after surgery if cleared by orthopedics

## 2012-08-31 NOTE — Telephone Encounter (Signed)
ATC patient, no answer [id number] 

## 2012-08-31 NOTE — Telephone Encounter (Signed)
Spoke with pt She states that she is scheduled to have carpal tunnel surgery on 09/02/12 Wants to know if needs to stop xarelto and if so, for how long? Please advise, thanks!

## 2012-09-01 ENCOUNTER — Encounter (HOSPITAL_COMMUNITY): Payer: Self-pay | Admitting: Pharmacist

## 2012-09-01 ENCOUNTER — Encounter (HOSPITAL_COMMUNITY): Payer: Self-pay

## 2012-09-01 MED ORDER — CEFAZOLIN SODIUM-DEXTROSE 2-3 GM-% IV SOLR
2.0000 g | INTRAVENOUS | Status: AC
  Administered 2012-09-02: 2 g via INTRAVENOUS
  Filled 2012-09-01: qty 50

## 2012-09-01 NOTE — H&P (Signed)
[name] is a 67 year old woman who is currently retired.  She presents today for evaluation of pain that she is having in the waistline on the right side and the neck and shoulder on the right side.  She reports that she has been very weak in her right shoulder and it is painful to move.  She says she has had this pain since the end of last year in January.  She says that the right side is quite stiff.  She does have a cane today and says she has been using a walking cane for the last few months.  She has taken two falls within the last 12 months.  She says that she is using the cane secondary to back pain.  She noticed that she was unable to fully abduct the right shoulder and says that this has been temporaneous with the pain that she has had in the neck and lower back for about 12 months.  She reports fairly significant hand numbness on the right side.  She says that her fingers are numb.  It started at the tips and progressed to include all of the fingers equally.  She says that the palm of her right hand is not numb, but the fingers are.  She does not have any problems on the left side.  She gave a further element of description stating that her fingers feel like they are cold from the inside, but they are not cold to touch.    PAST MEDICAL HISTORY:  She has a history of hypertension.  She has had four pulmonary emboli by her account.    She reports pain in the arm, neck, leg, back, weakness in the right hand, numbness and tingling in the right hand, bladder problems meaning incontinence, headaches.    PAST SURGICAL HISTORY:  She has had operations for the right hip, left hip and tubal ligation.    FAMILY HISTORY:    Mother 74, father 28, both are deceased.  Diabetes, hypertension, neurologic disease are present in the family history.  She did not specify more than that.    DRUG ALLERGIES:    No known drug allergies.    SOCIAL HISTORY:    She does not smoke.  She does not use alcohol.  She does  not use illicit drugs.  She reports being 5', 2 " tall and weighing 250 lbs.     REVIEW OF SYSTEMS:   Positive for eyeglasses, hypertension, heart murmur, leg pain with walking, arm weakness, back pain, arm pain, leg pain at rest, arthritis, neck pain.  Denies constitutional, ear, nose, throat, mouth, respiratory, gastrointestinal, genitourinary, skin, neurological, psychiatric, endocrine, hematologic and allergic problems.    On a pain chart she says her pain has gotten worse.  It has moved further around the right side of her waistline.  She says nothing relieves the pain and the prescription medications do not work either.  She feels stiff in and around her neck and pain going down the posterior aspect of the arm.    MEDICATIONS:    Hydrochlorothiazide, Spironolactone, Ferrous Sulfate, Xarelto, Lasix, Metoprolol, Verapamil and Gabapentin.    EXAMINATION:    On examination she is alert, oriented x 4 and answers all questions appropriately.  Memory, language, attention span and fund of knowledge are normal.  She is in obvious distress.  She walks with a cane and has a markedly antalgic gait.  She seems to favor the right lower extremity.  She is morbidly obese.  2+ reflexes biceps, triceps, brachioradialis, trace at the left ankle, not elicited at the right ankle, 1+ right knee, trace at the left knee.  She has normal muscle tone, bulk and coordination.  She had marked decrease of proprioception at the right index finger.  She simply stated she could not tell and that her finger was numb.  She had a markedly positive Tinel's sign over the right carpal tunnel; none on the left.  She had no Tinel's sign over the right cubital tunnel.  She has weakness in the right deltoid 4/5.  She has pain with passive abduction.  She has pain with passive flexion and extension also of the right shoulder.  She has pain when asked to forcibly adduct the right shoulder.  No pain or weakness in the left upper extremity.  No  pain or weakness in the lower extremities.    DIAGNOSTIC STUDIES:   MRI of the cervical spine is reviewed.  It shows a significant amount of degeneration at 5-6 and 6-7.  She is focally kyphotic at that level also.  There is certainly narrowing of the spinal canal.  CSF is effaced anteriorly at 6-7 space, somewhat at 5-6.  Abnormal cord signal is not present.  She has a significant amount of degenerative change present on the plain cervical spine film and some element of anterolisthesis of 3 on 4.  No lesion seen in the paraspinous tissue.  No abnormality seen within the bone itself. MRI of the right shoulder reveals a great deal of arthropathy.   DIAGNOSIS:     Neck pain, back pain, carpal tunnel syndrome right hand, cervical spondylosis.  Right shoulder pain.  SUMMARY:       [name] on EMG   has severe carpal tunnel disease on the right side, very bad carpal tunnel on the left side. She has a torn up shoulder so to speak as seen on MRI, but it is a lot of arthropathy present there and tendinitis. I think this is the reason for the problem she has had in the upper extremity.    In the lumbar spine, she has a degenerative listhesis and stenosis which is present.  If we were to do something about this, she would need to be fused secondary to the degenerative listhesis at L4-5.   We will proceed with the carpal tunnel release because it is so severe on that side and I think that is the reason for the hand discomfort. I will send her to Dr. Gean Birchwood who operated on her hips and whom she has great faith in to evaluate what is the right shoulder problem.

## 2012-09-02 ENCOUNTER — Encounter (HOSPITAL_COMMUNITY): Payer: Self-pay | Admitting: Anesthesiology

## 2012-09-02 ENCOUNTER — Encounter (HOSPITAL_COMMUNITY): Payer: Self-pay | Admitting: Surgery

## 2012-09-02 ENCOUNTER — Ambulatory Visit (HOSPITAL_COMMUNITY)
Admission: RE | Admit: 2012-09-02 | Discharge: 2012-09-02 | Disposition: A | Discharge status: Home / Self Care | Payer: Medicare Other | Source: Ambulatory Visit | Attending: Neurosurgery | Admitting: Neurosurgery

## 2012-09-02 ENCOUNTER — Ambulatory Visit (HOSPITAL_COMMUNITY): Payer: Medicare Other | Admitting: Anesthesiology

## 2012-09-02 ENCOUNTER — Ambulatory Visit (HOSPITAL_COMMUNITY): Payer: Medicare Other

## 2012-09-02 ENCOUNTER — Encounter (HOSPITAL_COMMUNITY)
Admission: RE | Disposition: A | Discharge status: Home / Self Care | Payer: Self-pay | Source: Ambulatory Visit | Attending: Neurosurgery

## 2012-09-02 DIAGNOSIS — M47812 Spondylosis without myelopathy or radiculopathy, cervical region: Secondary | ICD-10-CM | POA: Insufficient documentation

## 2012-09-02 DIAGNOSIS — G5600 Carpal tunnel syndrome, unspecified upper limb: Secondary | ICD-10-CM | POA: Insufficient documentation

## 2012-09-02 DIAGNOSIS — I10 Essential (primary) hypertension: Secondary | ICD-10-CM | POA: Insufficient documentation

## 2012-09-02 DIAGNOSIS — Z9181 History of falling: Secondary | ICD-10-CM | POA: Insufficient documentation

## 2012-09-02 HISTORY — PX: CARPAL TUNNEL RELEASE: SHX101

## 2012-09-02 HISTORY — DX: Cardiac murmur, unspecified: R01.1

## 2012-09-02 HISTORY — DX: Anemia, unspecified: D64.9

## 2012-09-02 LAB — CBC
HCT: 38.4 % (ref 36.0–46.0)
Hemoglobin: 12.7 g/dL (ref 12.0–15.0)
MCH: 29.4 pg (ref 26.0–34.0)
MCHC: 33.1 g/dL (ref 30.0–36.0)
MCV: 88.9 fL (ref 78.0–100.0)
Platelets: 282 10*3/uL (ref 150–400)
RBC: 4.32 MIL/uL (ref 3.87–5.11)
RDW: 14.6 % (ref 11.5–15.5)
WBC: 7.4 10*3/uL (ref 4.0–10.5)

## 2012-09-02 LAB — BASIC METABOLIC PANEL
BUN: 26 mg/dL — ABNORMAL HIGH (ref 6–23)
CO2: 27 mEq/L (ref 19–32)
Calcium: 9.9 mg/dL (ref 8.4–10.5)
Chloride: 101 mEq/L (ref 96–112)
Creatinine, Ser: 1.39 mg/dL — ABNORMAL HIGH (ref 0.50–1.10)
GFR calc Af Amer: 45 mL/min — ABNORMAL LOW (ref >90)
GFR calc non Af Amer: 39 mL/min — ABNORMAL LOW (ref >90)
Glucose, Bld: 94 mg/dL (ref 70–99)
Potassium: 3.8 mEq/L (ref 3.5–5.1)
Sodium: 139 mEq/L (ref 135–145)

## 2012-09-02 LAB — SURGICAL PCR SCREEN
MRSA, PCR: NEGATIVE
Staphylococcus aureus: NEGATIVE

## 2012-09-02 SURGERY — CARPAL TUNNEL RELEASE
Anesthesia: General | Laterality: Right | Wound class: Clean

## 2012-09-02 MED ORDER — LIDOCAINE HCL (CARDIAC) 20 MG/ML IV SOLN
INTRAVENOUS | Status: DC | PRN
  Administered 2012-09-02: 50 mg via INTRAVENOUS

## 2012-09-02 MED ORDER — MUPIROCIN 2 % EX OINT
TOPICAL_OINTMENT | CUTANEOUS | Status: AC
  Administered 2012-09-02: 1 via NASAL
  Filled 2012-09-02: qty 22

## 2012-09-02 MED ORDER — PROPOFOL 10 MG/ML IV BOLUS: INTRAVENOUS | Status: DC | PRN

## 2012-09-02 MED ORDER — FENTANYL CITRATE 0.05 MG/ML IJ SOLN
INTRAMUSCULAR | Status: DC | PRN
  Administered 2012-09-02: 50 ug via INTRAVENOUS

## 2012-09-02 MED ORDER — LACTATED RINGERS IV SOLN
INTRAVENOUS | Status: DC | PRN
  Administered 2012-09-02: 09:00:00 via INTRAVENOUS

## 2012-09-02 MED ORDER — HYDROCODONE-ACETAMINOPHEN 5-325 MG PO TABS: 1.0000 | ORAL_TABLET | Freq: Four times a day (QID) | ORAL | Status: DC | PRN

## 2012-09-02 MED ORDER — PROPOFOL INFUSION 10 MG/ML OPTIME
INTRAVENOUS | Status: DC | PRN
  Administered 2012-09-02: 50 ug/kg/min via INTRAVENOUS

## 2012-09-02 MED ORDER — 0.9 % SODIUM CHLORIDE (POUR BTL) OPTIME
TOPICAL | Status: DC | PRN
  Administered 2012-09-02: 1000 mL

## 2012-09-02 MED ORDER — ONDANSETRON HCL 4 MG/2ML IJ SOLN
INTRAMUSCULAR | Status: DC | PRN
  Administered 2012-09-02: 4 mg via INTRAVENOUS

## 2012-09-02 MED ORDER — MIDAZOLAM HCL 5 MG/5ML IJ SOLN
INTRAMUSCULAR | Status: DC | PRN
  Administered 2012-09-02: 1 mg via INTRAVENOUS

## 2012-09-02 MED ORDER — HYDROMORPHONE HCL PF 1 MG/ML IJ SOLN: 0.2500 mg | INTRAMUSCULAR | Status: DC | PRN

## 2012-09-02 MED ORDER — MUPIROCIN 2 % EX OINT
TOPICAL_OINTMENT | Freq: Two times a day (BID) | CUTANEOUS | Status: DC
  Administered 2012-09-02: 1 via NASAL
  Filled 2012-09-02: qty 22

## 2012-09-02 MED ORDER — ONDANSETRON HCL 4 MG/2ML IJ SOLN: 4.0000 mg | Freq: Once | INTRAMUSCULAR | Status: DC | PRN

## 2012-09-02 MED ORDER — LIDOCAINE-EPINEPHRINE 0.5 %-1:200000 IJ SOLN
INTRAMUSCULAR | Status: DC | PRN
  Administered 2012-09-02: 8 mL

## 2012-09-02 SURGICAL SUPPLY — 56 items
BANDAGE ELASTIC 3 VELCRO ST LF (GAUZE/BANDAGES/DRESSINGS) ×2 IMPLANT
BANDAGE ELASTIC 4 VELCRO ST LF (GAUZE/BANDAGES/DRESSINGS) ×2 IMPLANT
BLADE SURG 15 STRL LF DISP TIS (BLADE) ×1 IMPLANT
BLADE SURG 15 STRL SS (BLADE) ×1
CLOTH BEACON ORANGE TIMEOUT ST (SAFETY) ×2 IMPLANT
CORDS BIPOLAR (ELECTRODE) ×2 IMPLANT
DECANTER SPIKE VIAL GLASS SM (MISCELLANEOUS) ×2 IMPLANT
DERMABOND ADVANCED (GAUZE/BANDAGES/DRESSINGS)
DERMABOND ADVANCED .7 DNX12 (GAUZE/BANDAGES/DRESSINGS) IMPLANT
DRAPE EXTREMITY T 121X128X90 (DRAPE) ×2 IMPLANT
DRESSING TELFA 8X3 (GAUZE/BANDAGES/DRESSINGS) ×2 IMPLANT
DURAPREP 26ML APPLICATOR (WOUND CARE) ×2 IMPLANT
GAUZE SPONGE 4X4 16PLY XRAY LF (GAUZE/BANDAGES/DRESSINGS) ×2 IMPLANT
GLOVE BIO SURGEON STRL SZ 6.5 (GLOVE) IMPLANT
GLOVE BIO SURGEON STRL SZ7 (GLOVE) IMPLANT
GLOVE BIO SURGEON STRL SZ7.5 (GLOVE) IMPLANT
GLOVE BIO SURGEON STRL SZ8 (GLOVE) IMPLANT
GLOVE BIO SURGEON STRL SZ8.5 (GLOVE) IMPLANT
GLOVE BIOGEL M 8.0 STRL (GLOVE) IMPLANT
GLOVE BIOGEL PI IND STRL 6.5 (GLOVE) ×4 IMPLANT
GLOVE BIOGEL PI INDICATOR 6.5 (GLOVE) ×4
GLOVE ECLIPSE 6.5 STRL STRAW (GLOVE) ×2 IMPLANT
GLOVE ECLIPSE 7.0 STRL STRAW (GLOVE) ×2 IMPLANT
GLOVE ECLIPSE 7.5 STRL STRAW (GLOVE) IMPLANT
GLOVE ECLIPSE 8.0 STRL XLNG CF (GLOVE) IMPLANT
GLOVE ECLIPSE 8.5 STRL (GLOVE) IMPLANT
GLOVE EXAM NITRILE LRG STRL (GLOVE) ×2 IMPLANT
GLOVE EXAM NITRILE MD LF STRL (GLOVE) ×2 IMPLANT
GLOVE EXAM NITRILE XL STR (GLOVE) IMPLANT
GLOVE EXAM NITRILE XS STR PU (GLOVE) IMPLANT
GLOVE INDICATOR 6.5 STRL GRN (GLOVE) IMPLANT
GLOVE INDICATOR 7.0 STRL GRN (GLOVE) ×4 IMPLANT
GLOVE INDICATOR 7.5 STRL GRN (GLOVE) IMPLANT
GLOVE INDICATOR 8.0 STRL GRN (GLOVE) IMPLANT
GLOVE INDICATOR 8.5 STRL (GLOVE) IMPLANT
GLOVE OPTIFIT SS 8.0 STRL (GLOVE) IMPLANT
GLOVE SURG SS PI 6.5 STRL IVOR (GLOVE) IMPLANT
GOWN BRE IMP SLV AUR LG STRL (GOWN DISPOSABLE) ×4 IMPLANT
GOWN BRE IMP SLV AUR XL STRL (GOWN DISPOSABLE) ×2 IMPLANT
GOWN STRL REIN 2XL LVL4 (GOWN DISPOSABLE) IMPLANT
KIT BASIN OR (CUSTOM PROCEDURE TRAY) ×2 IMPLANT
KIT ROOM TURNOVER OR (KITS) ×2 IMPLANT
NEEDLE HYPO 25X1 1.5 SAFETY (NEEDLE) ×2 IMPLANT
NS IRRIG 1000ML POUR BTL (IV SOLUTION) ×2 IMPLANT
PACK SURGICAL SETUP 50X90 (CUSTOM PROCEDURE TRAY) ×2 IMPLANT
PAD ARMBOARD 7.5X6 YLW CONV (MISCELLANEOUS) ×6 IMPLANT
SPONGE GAUZE 4X4 12PLY (GAUZE/BANDAGES/DRESSINGS) ×2 IMPLANT
STOCKINETTE 4X48 STRL (DRAPES) ×2 IMPLANT
SUT ETHILON 3 0 PS 1 (SUTURE) ×2 IMPLANT
SYR BULB 3OZ (MISCELLANEOUS) ×2 IMPLANT
SYR CONTROL 10ML LL (SYRINGE) ×2 IMPLANT
TOWEL OR 17X24 6PK STRL BLUE (TOWEL DISPOSABLE) ×2 IMPLANT
TOWEL OR 17X26 10 PK STRL BLUE (TOWEL DISPOSABLE) ×2 IMPLANT
TUBE CONNECTING 12X1/4 (SUCTIONS) ×2 IMPLANT
UNDERPAD 30X30 INCONTINENT (UNDERPADS AND DIAPERS) ×2 IMPLANT
WATER STERILE IRR 1000ML POUR (IV SOLUTION) ×2 IMPLANT

## 2012-09-02 NOTE — Anesthesia Postprocedure Evaluation (Signed)
  Anesthesia Post-op Note  Patient: [name]  Procedure(s) Performed: Procedure(s) (LRB) with comments: CARPAL TUNNEL RELEASE (Right) - RIGHT carpal tunnel release  Patient Location: PACU  Anesthesia Type:MAC  Level of Consciousness: awake, alert , oriented and patient cooperative  Airway and Oxygen Therapy: Patient Spontanous Breathing  Post-op Pain: none  Post-op Assessment: Post-op Vital signs reviewed, Patient's Cardiovascular Status Stable, Respiratory Function Stable, Patent Airway, No signs of Nausea or vomiting and Pain level controlled  Post-op Vital Signs: stable  Complications: No apparent anesthesia complications

## 2012-09-02 NOTE — Op Note (Signed)
09/02/2012  9:48 AM  PATIENT:  Ocie Bob  67 y.o. female  PRE-OPERATIVE DIAGNOSIS:right  carpal tunnel syndrome  POST-OPERATIVE DIAGNOSIS: Right carpal tunnel syndrome  PROCEDURE:  Procedure(s): Right CARPAL TUNNEL RELEASE  SURGEON:  Surgeon(s): Carmela Hurt, MD  ASSISTANTS:none  ANESTHESIA:   local and IV sedation  EBL:  Total I/O In: 500 [I.V.:500] Out: -   BLOOD ADMINISTERED:none  CELL SAVER GIVEN:none  COUNT:per nursing  DRAINS: none   SPECIMEN:  No Specimen  DICTATION: Mrs. Calder was brought to the operating room and positioned supine on the operating room bed. Her right upper extremity was prepped and draped in a sterile manner. I infiltrated lidocaine into my planned incision starting at the distal palmar crease extending into the palm about 1cm. I opened the skin with a 15 blade. I dissected through the soft tissue and transverse carpal ligament using scissors and the 15 blade. I used a curved hemostat to protect the contents of the carpal tunnel. I decompressed the carpal tunnel by transecting the transverse carpal ligament. After inspection of the median nerve, and being satisfied with the decompression I irrigated the wound. I closed the wound in a single layer with vertical mattress sutures. I applied a sterile dressing.   PLAN OF CARE: Discharge to home after PACU  PATIENT DISPOSITION:  PACU - hemodynamically stable.   Delay start of Pharmacological VTE agent (>24hrs) due to surgical blood loss or risk of bleeding:  yes

## 2012-09-02 NOTE — Anesthesia Preprocedure Evaluation (Signed)
Anesthesia Evaluation  Patient identified by MRN, date of birth, ID band Patient awake    Reviewed: Allergy & Precautions, H&P , NPO status , Patient's Chart, lab work & pertinent test results  Airway Mallampati: I TM Distance: >3 FB Neck ROM: full    Dental   Pulmonary          Cardiovascular hypertension, Rhythm:regular Rate:Normal     Neuro/Psych  Neuromuscular disease    GI/Hepatic GERD-  ,  Endo/Other  Morbid obesity  Renal/GU Renal InsufficiencyRenal disease     Musculoskeletal   Abdominal   Peds  Hematology   Anesthesia Other Findings   Reproductive/Obstetrics                           Anesthesia Physical Anesthesia Plan  ASA: II  Anesthesia Plan: General   Post-op Pain Management:    Induction: Intravenous  Airway Management Planned: Nasal Cannula  Additional Equipment:   Intra-op Plan:   Post-operative Plan:   Informed Consent: I have reviewed the patients History and Physical, chart, labs and discussed the procedure including the risks, benefits and alternatives for the proposed anesthesia with the patient or authorized representative who has indicated his/her understanding and acceptance.     Plan Discussed with: CRNA, Anesthesiologist and Surgeon  Anesthesia Plan Comments:         Anesthesia Quick Evaluation

## 2012-09-02 NOTE — Preoperative (Signed)
Beta Blockers   Reason not to administer Beta Blockers:Not Applicable 

## 2012-09-02 NOTE — Transfer of Care (Signed)
Immediate Anesthesia Transfer of Care Note  Patient: [name]  Procedure(s) Performed: Procedure(s) (LRB) with comments: CARPAL TUNNEL RELEASE (Right) - RIGHT carpal tunnel release  Patient Location: PACU  Anesthesia Type:MAC  Level of Consciousness: awake, alert  and oriented  Airway & Oxygen Therapy: Patient Spontanous Breathing and Patient connected to nasal cannula oxygen  Post-op Assessment: Report given to PACU RN, Post -op Vital signs reviewed and stable and Patient moving all extremities X 4  Post vital signs: Reviewed and stable  Complications: No apparent anesthesia complications

## 2012-09-03 ENCOUNTER — Encounter (HOSPITAL_COMMUNITY): Payer: Self-pay | Admitting: Neurosurgery

## 2012-12-04 ENCOUNTER — Ambulatory Visit (INDEPENDENT_AMBULATORY_CARE_PROVIDER_SITE_OTHER): Payer: Medicare Other | Admitting: Family Medicine

## 2012-12-04 VITALS — BP 120/77 | HR 70 | Ht 62.5 in | Wt 256.0 lb

## 2012-12-04 DIAGNOSIS — M25561 Pain in right knee: Secondary | ICD-10-CM

## 2012-12-04 DIAGNOSIS — M25569 Pain in unspecified knee: Secondary | ICD-10-CM

## 2012-12-04 MED ORDER — METHYLPREDNISOLONE ACETATE 80 MG/ML IJ SUSP
80.0000 mg | Freq: Once | INTRAMUSCULAR | Status: AC
  Administered 2012-12-04: 80 mg via INTRA_ARTICULAR

## 2012-12-04 NOTE — Progress Notes (Signed)
  Subjective:    Patient ID: [name], female    DOB: [birth year], 67 y.o.   MRN: [id number]  HPI  [name] comes in for bilateral Knee pain.  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 Turner Daniels did visco supplementation injections a few years ago that helped for a while.  Dr. Briant Sites also did cortisone injections in her knee a few years ago that helped for several months.  She is not taking any medications for the pain because she has had PE's and is taking Xarelto and was told not to take any NSAIDS or aspirin.  She denies any new injury to her knees, says the pain ranges from 4/10-7/10.  She had lost about 50 lbs in the past several years, but has gained a bit back, wants to lose 50 more.   Review of Systems See HPI    Objective:   Physical Exam BP 120/77  Pulse 70  Ht 5' 2.5" (1.588 m)  Wt 256 lb (116.121 kg)  BMI 46.05 kg/m2 General appearance: alert, cooperative, no distress and morbidly obese Knee: Bilateral crepitus and joint line tenderness to palpation Medially>laterally. No obvious effusion, Patella and patellar tendons are pulled laterally due to body habitus.   Left Knee Injection Consent obtained and verified. Skin cleansed with alcohol x3 Topical analgesic spray: Ethyl chloride. Joint:Left Knee Approached in typical fashion with:Later approach Completed without difficulty Meds:40 mg Depomedrol, 4 cc's 1% Lidocaine Needle:21 g 1 1/2 inch needle Aftercare instructions and Red flags advised.  Right Knee Injection Consent obtained and verified. Skin cleansed with alcohol x3 Topical analgesic spray: Ethyl chloride. Joint:Right Knee Approached in typical fashion with:Later approach Completed without difficulty Meds:40 mg Depomedrol, 4 cc's 1% Lidocaine Needle:21 g 1 1/2 inch needle Aftercare instructions and Red flags advised.      Assessment & Plan:

## 2012-12-04 NOTE — Assessment & Plan Note (Signed)
Due to OA, bilateral cortisone injections.  Also discussed weight loss.  Also advised scheduling tylenol 650 PO TID.

## 2012-12-04 NOTE — Patient Instructions (Signed)
Please ice your knees tonight for twenty minutes.  Please call the office if you have any problems.   I suggest you start taking Tylenol (acetominophen) 650 mg three times a day to help control your arthritis pain.  This medication is OK to take with blood thinners.

## 2013-01-01 ENCOUNTER — Ambulatory Visit (INDEPENDENT_AMBULATORY_CARE_PROVIDER_SITE_OTHER): Payer: Medicare Other | Admitting: Critical Care Medicine

## 2013-01-01 ENCOUNTER — Encounter: Payer: Self-pay | Admitting: Critical Care Medicine

## 2013-01-01 VITALS — BP 140/80 | HR 70 | Temp 97.6°F | Ht 62.5 in | Wt 242.0 lb

## 2013-01-01 DIAGNOSIS — I2699 Other pulmonary embolism without acute cor pulmonale: Secondary | ICD-10-CM

## 2013-01-01 NOTE — Patient Instructions (Addendum)
No change in medications. Return in        6 months        

## 2013-01-01 NOTE — Progress Notes (Signed)
  Subjective:    Patient ID: [name], female    DOB: Mar 19, 1946, 67 y.o.   MRN: [id number]  HPI  67 y.o.AAF wiht prior hx of Pulmonary embollii in 1997 with recurrence in 2005 when coumadin dosing was adjusted and INR fell. Pt had IVC filter placed in 2005 with bleeding complications. Pt did well and has been on coumadin since that time.  Pt now in [facility] clinic and being followed. 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 Pt is limited in ambulation.  The patient was switched to Xarelto  summer 2013   01/01/2013 Now on xarelto.  DOing well. No bleeding so far.  No chest pain .  No wheezing No coumadin rash     Past Medical History  Diagnosis Date  . Hyperlipidemia   . 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 Recurrent pulmonary embolism     see pulmonologist Dr. Shan Levans  . Obesity (BMI 30-39.9)   . DJD (degenerative joint disease) of hip   . Sprain and strain of wrist   . CKD (chronic kidney disease), stage III     pt doesn't know why this is listed  . GERD (gastroesophageal reflux disease)     h/o in the 1990s  . Heart murmur   . Hypertension     sees Dr. Edilia Bo  . Anemia    No Known Allergies  Review of Systems  Patient denies significant dyspnea,cough, hemoptysis,  chest pain, palpitations, pedal edema, orthopnea, paroxysmal nocturnal dyspnea, lightheadedness, nausea, vomiting, abdominal or  leg pains      Objective:   Physical Exam  BP 140/80  Pulse 70  Temp(Src) 97.6 F (36.4 C) (Oral)  Ht 5' 2.5" (1.588 m)  Wt 242 lb (109.77 kg)  BMI 43.53 kg/m2  SpO2 95%   Gen. Pleasant, well-nourished, in no distress, normal affect ENT - no lesions, no post nasal drip Neck: No JVD, no thyromegaly, no carotid  bruits Lungs: no use of accessory muscles, no dullness to percussion, clear without rales or rhonchi  Cardiovascular: Rhythm regular, heart sounds  normal, no murmurs or gallops, 2+ peripheral edema, chronic venous stasis both LE Abdomen: soft and non-tender, no hepatosplenomegaly, BS normal. Musculoskeletal: No deformities, no cyanosis or clubbing Neuro:  alert, non focal       Assessment & Plan:   PULMONARY EMBOLISM Hx of recurrent DVT/PE with IVC filter . Now on lifelong anticoagulation with Xarelto Cr is satisfactory for current 20mg /d xarelto dose with Cr Cl of >45 Plan xarelto for life rov 6 months

## 2013-01-02 NOTE — Assessment & Plan Note (Signed)
Hx of recurrent DVT/PE with IVC filter . Now on lifelong anticoagulation with Xarelto Cr is satisfactory for current 20mg /d xarelto dose with Cr Cl of >45 Plan xarelto for life rov 6 months

## 2013-01-08 ENCOUNTER — Telehealth: Payer: Self-pay | Admitting: Family Medicine

## 2013-01-08 NOTE — Telephone Encounter (Signed)
[name] need a letter stating that she is unable to be on jury duty due to her physical condition that inhibits her from walking long distances and up the stairs.  Please call her w hen letter is ready for pick up.  Need before the 26th of this month

## 2013-01-11 NOTE — Telephone Encounter (Signed)
I have not seen her since December so I'm not sure what her current physical condition is.  I would be glad to fill out a form for a temporary handicap placard as there are handicap spots available and elevator for her to access the building.

## 2013-01-11 NOTE — Telephone Encounter (Signed)
Patient notified of MD response.  Patient states she already has a handicap placard.  Patient states "I have to go to [facility] and I don't know anything about [place]."  I told patient she could call and make an appt with Dr. Ashley Royalty to discuss this further if she wanted.  Pt said "ok, Thank you." and hung up.   Marvel Sapp, Darlyne Russian, CMA

## 2013-01-11 NOTE — Telephone Encounter (Signed)
Will fwd to MD.  [name], CMA  

## 2013-01-11 NOTE — Telephone Encounter (Signed)
Patient is calling to see if the letter to excuse her from jury duty is ready.  She would have to go to [facility], and the court case is on the 4th floor and she feels this would be too much for her legs.  She needs this long before June 26th.  Please call her when it is ready to be picked up.

## 2013-01-27 ENCOUNTER — Ambulatory Visit (INDEPENDENT_AMBULATORY_CARE_PROVIDER_SITE_OTHER): Payer: Medicare Other | Admitting: Family Medicine

## 2013-01-27 ENCOUNTER — Ambulatory Visit (HOSPITAL_COMMUNITY)
Admission: RE | Admit: 2013-01-27 | Discharge: 2013-01-27 | Disposition: A | Discharge status: Home / Self Care | Payer: Medicare Other | Source: Ambulatory Visit | Attending: Family Medicine | Admitting: Family Medicine

## 2013-01-27 ENCOUNTER — Encounter: Payer: Self-pay | Admitting: Family Medicine

## 2013-01-27 VITALS — BP 135/90 | HR 73 | Ht 62.5 in | Wt 256.0 lb

## 2013-01-27 DIAGNOSIS — J323 Chronic sphenoidal sinusitis: Secondary | ICD-10-CM | POA: Insufficient documentation

## 2013-01-27 DIAGNOSIS — G459 Transient cerebral ischemic attack, unspecified: Secondary | ICD-10-CM

## 2013-01-27 DIAGNOSIS — R200 Anesthesia of skin: Secondary | ICD-10-CM

## 2013-01-27 DIAGNOSIS — R209 Unspecified disturbances of skin sensation: Secondary | ICD-10-CM | POA: Insufficient documentation

## 2013-01-27 DIAGNOSIS — R51 Headache: Secondary | ICD-10-CM | POA: Insufficient documentation

## 2013-01-27 HISTORY — DX: Anesthesia of skin: R20.0

## 2013-01-27 NOTE — Assessment & Plan Note (Signed)
Large concern for TIAs, precepted with Drs Mauricio Po and Leveda Anna.  Will initiate TIA/stroke work up with MRI/MRA, carotid dopplers, and ECHO.  Pt informed not to drive prior to f/u with MD.  Will have her return in 1 week for follow up.  Advised to go to ER if she has another one of these episodes.  Pt supposed to have jury duty tomorrow, excuse note written due to her needing to have testing done tomorrow.  If stroke w/u is negative, could consider radiculopathy/disc issues in neck vs demyelinating d/o vs other.

## 2013-01-27 NOTE — Patient Instructions (Signed)
It was nice to meet you today.  My biggest concern is that you are having mini strokes.  I do not want you to drive until this work up is completed.  We are getting some things done, including a scan of your head and ultrasounds of your heart and neck veins.  Come back next week to see your new doctor.

## 2013-01-27 NOTE — Progress Notes (Signed)
S: Pt comes in today for SDA for episodes of arms getting numb and headache.  She reports that she has had 5 episodes of both arms "going to sleep" and "going complete dead" where they are numb and she cannot move them.  Her first episode was last Tuesday (8 days ago), which happened when she was sitting on the porch talking with a friend.  She suddenly felt nauseated and her arms when numb from her shoulders to her fingers-- last for 4-5 minutes and resolved spontaneously.  She then had multiple episodes in a row last Sunday (3 days ago) that started when she was driving and getting ready to park-- she reports she luckily could just pull straight into a parking spot because she was unable to move her arms again.  She had 4 episodes, each lasting 3-5 min with a few minutes of spontaneous resolution between episodes, entire thing lasted for ~30 min.  She feels nauseated during these episodes, but no dizziness, syncope, she can recall the entire episodes, has never had anything happen like this before.  She does note that for the past 2-3 weeks she has been having severe headaches off and on, having them every 2 days.  Worse than she has had before.  Pain is mostly frontal, throbbing - unsure if they were associated with the numbness episodes.  No falls, no head trauma.  Is on Xarelto for h/o multiple PEs, switched from coumadin ~11 months ago.  Does not use tobacco.  Does have h/o carpal tunnel, with surgery in 08/2012 on her right wrist, which resolved the hand numbness she was having-- this is different than the carpal tunnel.  No neck or back surgeries, no movements or positions can cause the numbness to come on.   ROS: Per HPI  History  Smoking status  . Never Smoker   Smokeless tobacco  . Not on file    O:  Filed Vitals:   01/27/13 0950  BP: 135/90  Pulse: 73    Gen: NAD HEENT: MMM, EOMI, PERRLA  CV: RRR, no murmur Pulm: CTA bilat, no wheezes or crackles Abd: soft, NT Ext: Warm, no chronic  skin changes, no edema Neuro: CN 2-12 intact, sensation intact throughout, strength 5/5 throughout- including bilateral arm, grip, and leg; no current deficits, gait normal for pt per pt with use of cane     A/P: 67 y.o. female p/w concern for TIAs -See problem list -f/u within 1 week

## 2013-01-28 ENCOUNTER — Ambulatory Visit (HOSPITAL_COMMUNITY): Payer: Medicare Other

## 2013-01-28 ENCOUNTER — Other Ambulatory Visit (HOSPITAL_COMMUNITY): Payer: Self-pay | Admitting: Family Medicine

## 2013-01-28 ENCOUNTER — Encounter: Payer: Self-pay | Admitting: Family Medicine

## 2013-01-28 ENCOUNTER — Ambulatory Visit (HOSPITAL_COMMUNITY)
Admission: RE | Admit: 2013-01-28 | Discharge: 2013-01-28 | Disposition: A | Discharge status: Home / Self Care | Payer: Medicare Other | Source: Ambulatory Visit | Attending: Family Medicine | Admitting: Family Medicine

## 2013-01-28 DIAGNOSIS — Z86711 Personal history of pulmonary embolism: Secondary | ICD-10-CM | POA: Insufficient documentation

## 2013-01-28 DIAGNOSIS — R200 Anesthesia of skin: Secondary | ICD-10-CM

## 2013-01-28 DIAGNOSIS — I059 Rheumatic mitral valve disease, unspecified: Secondary | ICD-10-CM | POA: Insufficient documentation

## 2013-01-28 DIAGNOSIS — E785 Hyperlipidemia, unspecified: Secondary | ICD-10-CM | POA: Insufficient documentation

## 2013-01-28 DIAGNOSIS — I519 Heart disease, unspecified: Secondary | ICD-10-CM | POA: Insufficient documentation

## 2013-01-28 DIAGNOSIS — R209 Unspecified disturbances of skin sensation: Secondary | ICD-10-CM | POA: Insufficient documentation

## 2013-01-28 DIAGNOSIS — I10 Essential (primary) hypertension: Secondary | ICD-10-CM | POA: Insufficient documentation

## 2013-01-28 DIAGNOSIS — G459 Transient cerebral ischemic attack, unspecified: Secondary | ICD-10-CM

## 2013-01-28 DIAGNOSIS — R51 Headache: Secondary | ICD-10-CM | POA: Insufficient documentation

## 2013-01-28 NOTE — Progress Notes (Signed)
VASCULAR LAB PRELIMINARY  PRELIMINARY  PRELIMINARY  PRELIMINARY  Carotid duplex  completed.    Preliminary report:  Bilateral:  Less than 39% ICA stenosis.  Vertebral artery flow is antegrade.      Aryona Sill, RVT 01/28/2013, 9:34 AM    3

## 2013-01-28 NOTE — Progress Notes (Signed)
Echo Lab  2D Echocardiogram completed.  Kyeisha Janowicz L Donato Studley, RDCS 01/28/2013 9:57 AM

## 2013-02-08 ENCOUNTER — Telehealth: Payer: Self-pay | Admitting: Family Medicine

## 2013-02-08 NOTE — Telephone Encounter (Signed)
Called pt. Advised pt to schedule appt with Dr.McIntyre (new PCP) and she will go over all of her results with her and she can also meet her new PCP. Pt agreed and will schedule OV. Fwd. For info. Lorenda Hatchet, Renato Battles

## 2013-02-08 NOTE — Telephone Encounter (Signed)
Pt is requesting the results from her MRI, Echo, and ultrasound. JW

## 2013-02-09 ENCOUNTER — Encounter: Payer: Self-pay | Admitting: Family Medicine

## 2013-02-09 ENCOUNTER — Ambulatory Visit (INDEPENDENT_AMBULATORY_CARE_PROVIDER_SITE_OTHER): Payer: Medicare Other | Admitting: Family Medicine

## 2013-02-09 VITALS — BP 122/80 | HR 72 | Temp 98.1°F | Ht 62.5 in | Wt 261.0 lb

## 2013-02-09 DIAGNOSIS — R200 Anesthesia of skin: Secondary | ICD-10-CM

## 2013-02-09 DIAGNOSIS — M6740 Ganglion, unspecified site: Secondary | ICD-10-CM

## 2013-02-09 DIAGNOSIS — R209 Unspecified disturbances of skin sensation: Secondary | ICD-10-CM

## 2013-02-09 DIAGNOSIS — D649 Anemia, unspecified: Secondary | ICD-10-CM

## 2013-02-09 DIAGNOSIS — E785 Hyperlipidemia, unspecified: Secondary | ICD-10-CM

## 2013-02-09 DIAGNOSIS — I10 Essential (primary) hypertension: Secondary | ICD-10-CM

## 2013-02-09 LAB — CBC
HCT: 37.2 % (ref 36.0–46.0)
Hemoglobin: 12.6 g/dL (ref 12.0–15.0)
MCH: 29.2 pg (ref 26.0–34.0)
MCHC: 33.9 g/dL (ref 30.0–36.0)
MCV: 86.3 fL (ref 78.0–100.0)
Platelets: 349 10*3/uL (ref 150–400)
RBC: 4.31 MIL/uL (ref 3.87–5.11)
RDW: 15.1 % (ref 11.5–15.5)
WBC: 8.5 10*3/uL (ref 4.0–10.5)

## 2013-02-09 LAB — POCT GLYCOSYLATED HEMOGLOBIN (HGB A1C): Hemoglobin A1C: 6

## 2013-02-09 NOTE — Patient Instructions (Addendum)
It was nice to meet you today!  I am referring you to neurology for the problems you are having in your arms. Please do not drive until cleared by a doctor.  For the wrist, try getting an over the counter wrist splint and see if it helps.  We are checking some routine bloodwork today. I will call you if your test results are not normal.  Otherwise, I will send you a letter.  If you do not hear from me with in 2 weeks please call our office.     Be well, Dr. Pollie Meyer

## 2013-02-10 LAB — COMPREHENSIVE METABOLIC PANEL
ALT: <8 U/L (ref 0–35)
AST: 13 U/L (ref 0–37)
Albumin: 4.2 g/dL (ref 3.5–5.2)
Alkaline Phosphatase: 72 U/L (ref 39–117)
BUN: 22 mg/dL (ref 6–23)
CO2: 26 mEq/L (ref 19–32)
Calcium: 9.8 mg/dL (ref 8.4–10.5)
Chloride: 103 mEq/L (ref 96–112)
Creat: 1.43 mg/dL — ABNORMAL HIGH (ref 0.50–1.10)
Glucose, Bld: 87 mg/dL (ref 70–99)
Potassium: 4.1 mEq/L (ref 3.5–5.3)
Sodium: 139 mEq/L (ref 135–145)
Total Bilirubin: 0.3 mg/dL (ref 0.3–1.2)
Total Protein: 7 g/dL (ref 6.0–8.3)

## 2013-02-10 LAB — TSH: TSH: 2.099 u[IU]/mL (ref 0.350–4.500)

## 2013-02-10 LAB — LDL CHOLESTEROL, DIRECT: Direct LDL: 170 mg/dL — ABNORMAL HIGH

## 2013-02-10 NOTE — Progress Notes (Signed)
Patient ID: [name], female   DOB: Jan 05, 1946, 67 y.o.   MRN: [id number]  HPI:  Arm numbness: pt seen previously by prior PCP who initiated TIA workup but MRI/MRA, echo, and carotid dopplers were essentially unremarkable. Pt reports continued episodes of arm numbness and weakness. She has had two episodes since her last appointment, each lasting 3-5 minutes at a time. She has not been driving. The episodes consist of numbness form her arms down to her hands, and she cannot move her hands or arms when it happens. Most recent episode was on Sunday. Denies chest pain or shortness of breath. Has a headache about every other day for the last 2 months.  Wrist bump: has noticed a bump on her R wrist. Previously had carpal tunnel release surgery done by Dr. Threasa Heads of neurosurgery. Endorses some weakness in her R hand from this bump.  ROS: See HPI. + arm weakness, numbness. No chest pain, sob. + headache.  PMFSH: has hx of recurrent VTE and is on xarelto for this.  PHYSICAL EXAM: BP 122/80  Pulse 72  Temp(Src) 98.1 F (36.7 C) (Oral)  Ht 5' 2.5" (1.588 m)  Wt 261 lb (118.389 kg)  BMI 46.95 kg/m2 Gen: NAD Heart: RRR Lungs: CTAB Neuro: CN II-XII tested and intact. Grip 5/5 bilaterally. Leg strength 5/5 bilaterally. Ambulates with ease. Speech normal. Normal FNF and rapid alternating movements. Ext: R wrist with ganglion cyst present on ulnar aspect of flexor surface

## 2013-02-14 DIAGNOSIS — M6740 Ganglion, unspecified site: Secondary | ICD-10-CM | POA: Insufficient documentation

## 2013-02-14 HISTORY — DX: Ganglion, unspecified site: M67.40

## 2013-02-14 NOTE — Assessment & Plan Note (Addendum)
Since we are drawing blood today, will check CBC today to monitor Hgb.

## 2013-02-14 NOTE — Assessment & Plan Note (Addendum)
Check direct LDL due to hx of HLD and as part of TIA workup. Will also check CMET today to monitor LFT's. Pt not currently on a statin, it appears she has taken lipitor in the past but did not tolerate it due to reflux. Will assess LDL and consider adding statin.

## 2013-02-14 NOTE — Assessment & Plan Note (Signed)
Check creatinine today on CMET to monitor for CKD progression. If GFR is less than 30 will need to adjust dose of xarelto.

## 2013-02-14 NOTE — Assessment & Plan Note (Addendum)
Recent TIA workup essentially negative - only findings on echo were calcified mitral valve, grade 1 diastolic dysfunction, and on carotid doppler <39% stenosis of carotid arteries. Neuro exam nonfocal today. To complete TIA workup will obtain A1c, TSH, direct LDL today (returning for fasting lipid panel would be difficult for pt, who is unable to drive due to episodes). Given continued episodes, will refer to neurology for further evaluation. She may benefit from an MRI of her cervical spine, but I would prefer to have neurology evaluate her first in case MRI is not actually indicated. Will have pt f/u with me in 1 month to monitor symptoms and follow up on neurology recommendations. Instructed pt continue to abstain from driving until cleared by a physician.

## 2013-02-14 NOTE — Assessment & Plan Note (Signed)
Appearance on exam consistent with ganglion cyst (Dr. Leveda Anna examined wrist and agrees with this assessment). Pt ambulates with cane and uses R hand for balance, which likely exacerbates the cyst pain. Due to concern about more serious problem (episodes of bilateral arm numbness) we were not able to fully address this problem today. Attempted to find a cockup splint in clinic to give pt, but we did not have one available. Advised she obtain an OTC wrist splint to see if it will help her symptoms. Will have pt f/u in 1 month to see how she is doing for this and for her other medical problems.

## 2013-02-16 ENCOUNTER — Telehealth: Payer: Self-pay | Admitting: Critical Care Medicine

## 2013-02-16 NOTE — Telephone Encounter (Signed)
i do not feel headaches are due to xarelto

## 2013-02-16 NOTE — Telephone Encounter (Signed)
Spoke with pt She has been having HA's for the past 2 months  Was reading about s/e of xarelto and HA was one of them  She has been taking med x 11 months now Has appt with neuro for HA eval  In the meantime, she is wondering if PW thinks that xarelto to be contributing to her HA Please advise, thanks!

## 2013-02-17 NOTE — Telephone Encounter (Signed)
Called, spoke with pt.  Informed her of below per PW.  Advised to keep OV with Neuro.  She verbalized understanding and voiced no further questions or concerns at this time.

## 2013-02-26 ENCOUNTER — Encounter: Payer: Self-pay | Admitting: Neurology

## 2013-02-26 ENCOUNTER — Other Ambulatory Visit: Payer: Self-pay | Admitting: Neurology

## 2013-02-26 ENCOUNTER — Ambulatory Visit (INDEPENDENT_AMBULATORY_CARE_PROVIDER_SITE_OTHER): Payer: Medicare Other | Admitting: Neurology

## 2013-02-26 VITALS — BP 123/77 | HR 81 | Temp 97.3°F | Ht 62.5 in | Wt 257.0 lb

## 2013-02-26 DIAGNOSIS — R51 Headache: Secondary | ICD-10-CM

## 2013-02-26 DIAGNOSIS — R200 Anesthesia of skin: Secondary | ICD-10-CM

## 2013-02-26 DIAGNOSIS — R110 Nausea: Secondary | ICD-10-CM

## 2013-02-26 DIAGNOSIS — R209 Unspecified disturbances of skin sensation: Secondary | ICD-10-CM

## 2013-02-26 DIAGNOSIS — R29898 Other symptoms and signs involving the musculoskeletal system: Secondary | ICD-10-CM

## 2013-02-26 MED ORDER — AMITRIPTYLINE HCL 25 MG PO TABS: ORAL_TABLET | ORAL | Status: DC

## 2013-02-26 NOTE — Patient Instructions (Addendum)
I think overall you are doing fairly well but I do want to suggest a few things today:  Remember to drink plenty of fluid, eat healthy meals and do not skip any meals. Try to eat protein with a every meal and eat a healthy snack such as fruit or nuts in between meals. Try to keep a regular sleep-wake schedule and try to exercise daily, particularly in the form of walking, 20-30 minutes a day, if you can.   Engage in social activities in your community and with your family and try to keep up with current events by reading the newspaper or watching the news.   As far as your medications are concerned, I would like to suggest a trial of Elavil (amitriptyline) 25 mg: Take half a pill daily at bedtime for one week, then one pill daily at bedtime for one week, then one and a half pills daily at bedtime for one week, then 2 pills daily at bedtime thereafter. Common side effects reported are: mouth dryness, drowsiness, confusion, dizziness.  As far as diagnostic testing: MRI neck, EEG - brain wave test, EMG/NCV: nerve and muscle test  I would like to see you back in 3 months, sooner if we need to. Please call us with any interim questions, concerns, problems, updates or refill requests.  Please also call us for any test results so we can go over those with you on the phone. Brett Canales is my clinical assistant and will answer any of your questions and relay your messages to me and also relay most of my messages to you.  Our phone number is [phone]. We also have an after hours call service for urgent matters and there is a physician on-call for urgent questions. For any emergencies you know to call 911 or go to the nearest emergency room.

## 2013-02-26 NOTE — Progress Notes (Signed)
Subjective:    Patient ID: [name] is a 67 y.o. female.  HPI  Sharon Foley, MD, PhD [facility] [address] [address] [place], [place] [address]  Dear Dr. Pollie Meyer,   I saw your patient, [name], upon your kind request in my neurologic clinic today for initial consultation of her episodic bilateral arm numbness and weakness. The patient is unaccompanied today. As you know, [name] is a very pleasant 67 year old right-handed woman with an underlying medical history of chronic kidney disease, recurrent thrombophlebitis and recurrent PE, previously on coumadin, now on Xarelto, hyperlipidemia, anemia, who has been having intermittent episodes of weakness affecting her arms as well as numbness. This may last 3-5 minutes at a time. She has had several episodes in the last 2 months. She has also had recurrent headaches. She has had an outside TIA workup which included MRI and MRA brain, echocardiogram and carotid Doppler studies which were unremarkable with less than 39% stenosis in b/l ICAs and her MRI/MRA brain from 01/27/13: Normal MRI appearance of the brain. Left sphenoid sinusitis with opacification of greater than 50%. Mild distal small vessel disease. No significant proximal stenosis, aneurysm, or branch vessel occlusion.  She has had several episodes since late June of fairly sudden onset of bilateral arm weakness and numbness, lasting af few minutes at a time. She has had associated nausea, but no vomiting and has had a throbbing, bifrontal HA, and the HA lasts 3 hours. She had two episodes while driving.   She denies snoring or choking sensations while asleep, no witnessed apneas are reported. No prior Hx of migraine. Her older sister had migraines.   I reviewed her test results thus far with her today.  Her Past Medical History Is Significant For: Past Medical History  Diagnosis Date  . Hyperlipidemia   . 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 Recurrent pulmonary embolism     see pulmonologist Dr. Shan Levans  . Obesity (BMI 30-39.9)   . DJD (degenerative joint disease) of hip   . Sprain and strain of wrist   . CKD (chronic kidney disease), stage III     pt doesn't know why this is listed  . GERD (gastroesophageal reflux disease)     h/o in the 1990s  . Heart murmur   . Hypertension     sees Dr. Edilia Bo  . Anemia     Her Past Surgical History Is Significant For: Past Surgical History  Procedure Laterality Date  . Partial hip arthroplasty  Left, 02/2009    Dr Elita Quick  . Shoulder surgery  Left 07/15/08    Dr Elita Quick  . Femoral vein ligation  12/2002  . [place] filter  11/03/2003    R Jugular  . Total hip arthroplasty  R 05/2203, L 2010  . Tubal ligation  1981  . Transthoracic echocardiogram      EF 55-60%, RV mod dilated, mildly increased LV wall thickness  . Dilation and curettage of uterus  02/21/2012    Procedure: DILATATION AND CURETTAGE;  Surgeon: Allie Bossier, MD;  Location: [facility];  Service: Gynecology;  Laterality: N/A;  . Carpal tunnel release  09/02/2012    Procedure: CARPAL TUNNEL RELEASE;  Surgeon: Carmela Hurt, MD;  Location: [facility];  Service: Neurosurgery;  Laterality: Right;  RIGHT carpal tunnel  release    Her Family History Is Significant For: Family History  Problem Relation Sharp of Onset  . Heart disease Mother   . Heart disease Father   . Cancer Brother   . Cancer Sister     Her Social History Is Significant For: History   Social History  . Marital Status: Widowed    Spouse Name: N/A    Number of Children: N/A  . Years of Education: N/A   Social History Main Topics  . Smoking status: Never Smoker   . Smokeless tobacco: None  . Alcohol Use: No  . Drug Use: No  . Sexually Active: Not Currently    Birth Control/ Protection:  None   Other Topics Concern  . None   Social History Narrative   Former Surveyor, mining. Unemployed since 2004 bec  of disability (severe L hip pain).    Lives at home with  daughter born in '87.    Non smoker. Non alcohol drinker.;    Fosters children .             Her Allergies Are:  No Known Allergies:   Her Current Medications Are:  Outpatient Encounter Prescriptions as of 02/26/2013  Medication Sig Dispense Refill  . ferrous sulfate 325 (65 FE) MG tablet Take 650 mg by mouth daily with breakfast.      . furosemide (LASIX) 20 MG tablet Take 20 mg by mouth See admin instructions. Taking 1 tablet for 2 days, then skips for 1-2 days and repeats      . hydrochlorothiazide (HYDRODIURIL) 25 MG tablet Take 25 mg by mouth daily.      . metoprolol succinate (TOPROL-XL) 50 MG 24 hr tablet Take 50 mg by mouth at bedtime.       . Rivaroxaban (XARELTO) 20 MG TABS Take 20 mg by mouth daily with supper.      Marland Kitchen spironolactone (ALDACTONE) 25 MG tablet Take 25 mg by mouth daily.      . verapamil (CALAN-SR) 240 MG CR tablet Take 240 mg by mouth at bedtime.      Marland Kitchen amitriptyline (ELAVIL) 25 MG tablet 1/2 pill each bedtime x 1 week, then 1 pill nightly x 1 week, then 1 1/2 pills nightly x 1 week, then 2 pills nightly thereafter.  60 tablet  3   No facility-administered encounter medications on file as of 02/26/2013.   Review of Systems  Neurological: Positive for weakness, numbness (Arms and hands) and headaches.  Hematological: Bruises/bleeds easily.       Anemia    Objective:  Neurologic Exam  Physical Exam Physical Examination:   Filed Vitals:   02/26/13 0940  BP: 123/77  Pulse: 81  Temp: 97.3 F (36.3 C)    General Examination: The patient is a very pleasant 67 y.o. female in no acute distress. She appears well-developed and well-nourished and well groomed.   HEENT: Normocephalic, atraumatic, pupils are equal, round and reactive to light and accommodation. Funduscopic exam is  normal with sharp disc margins noted. Extraocular tracking is good without limitation to gaze excursion or nystagmus noted. Normal smooth pursuit is noted. Hearing is grossly intact. Tympanic membranes are clear bilaterally. Face is symmetric with normal facial animation and normal facial sensation. Speech is clear with no dysarthria noted. There is no hypophonia. There is no lip, neck/head, jaw or voice tremor. Neck is supple with full range of passive and active motion. There are no carotid bruits on auscultation. Oropharynx exam reveals: mild mouth  dryness, adequate dental hygiene and moderate airway crowding, due to larger tongue and redundant soft palate. Mallampati is class II. Tongue protrudes centrally and palate elevates symmetrically.  Chest: Clear to auscultation without wheezing, rhonchi or crackles noted.  Heart: S1+S2+0, regular and normal without murmurs, rubs or gallops noted.   Abdomen: Soft, non-tender and non-distended with normal bowel sounds appreciated on auscultation.  Extremities: There is trace pitting edema in the distal lower extremities bilaterally. Pedal pulses are intact.  Skin: Warm and dry without trophic changes noted. There are no varicose veins, but she has significant discoloration to her legs from prior phlebitis.  Musculoskeletal: exam reveals no obvious joint deformities, tenderness or joint swelling or erythema.   Neurologically:  Mental status: The patient is awake, alert and oriented in all 4 spheres. Her memory, attention, language and knowledge are appropriate. There is no aphasia, agnosia, apraxia or anomia. Speech is clear with normal prosody and enunciation. Thought process is linear. Mood is congruent and affect is normal.  Cranial nerves are as described above under HEENT exam. In addition, shoulder shrug is normal with equal shoulder height noted. Motor exam: Normal bulk, strength and tone is noted. There is no drift, tremor or rebound, no atrophy or  fasciculations. Romberg is negative. Reflexes are 1+ in the UEs and diminished in the LEs. Toes are downgoing bilaterally. Fine motor skills are intact with normal finger taps, normal hand movements, normal rapid alternating patting, normal foot taps and normal foot agility.  Cerebellar testing shows no dysmetria or intention tremor on finger to nose testing. Heel to shin is unremarkable bilaterally. There is no truncal or gait ataxia.  Sensory exam is intact to light touch, pinprick, vibration, temperature sense and proprioception in the upper and lower extremities.  Gait, station and balance: She has trouble standing and walks with a limp, almost a waddle. Posture is moderately stooped. No veering to one side is noted. No leaning to one side is noted. Posture is Sharp-appropriate and stance is wide based. No problems turning are noted. She turns en bloc. Tandem walk is difficult for her. Intact toe stance and has difficulty with heel stance is noted.               Assessment and plan:   Assessment and Plan:  In summary, [name] is a very pleasant 67 y.o.-year old female with a history of recurrent bilateral arm numbness and weakness, associated with nausea and followed by HAs. DDx includes migraines, cervical radiculopathy, less likely TIA, Sz. Her exam is reassuringly non-focal today. She has had w/u which I reviewed her test results thus far with her in detail today. I suggested further workup with blood work including rheumatological testing ESR and so forth as well as EEG, for the remote possibility of seizure-like episodes, EMG nerve conduction studies for her complaint of numbness and cervical spine MRI. Symptomatically for these recurrent headaches I would like to try her on low-dose Elavil and bring it up slowly. She is advised about potential side effects today verbally and in writing. She is advised to call us with any interim questions and we should be calling her back as we get her test  results back. I would like to see her back in 3 months from now, sooner if the need arises and advised her to call with any interim questions, concerns, problems or updates and refill requests and test results.   Thank you very much for allowing me to participate in the care  of this nice patient. If I can be of any further assistance to you please do not hesitate to call me at [phone].  Sincerely,   Sharon Age, MD, PhD

## 2013-03-02 ENCOUNTER — Other Ambulatory Visit: Payer: Self-pay | Admitting: Family Medicine

## 2013-03-02 LAB — IFE AND PE, SERUM
Albumin SerPl Elph-Mcnc: 4 g/dL (ref 3.2–5.6)
Albumin/Glob SerPl: 1.3 (ref 0.7–2.0)
Alpha 1: 0.3 g/dL (ref 0.1–0.4)
Alpha2 Glob SerPl Elph-Mcnc: 0.7 g/dL (ref 0.4–1.2)
B-Globulin SerPl Elph-Mcnc: 1 g/dL (ref 0.6–1.3)
Gamma Glob SerPl Elph-Mcnc: 1 g/dL (ref 0.5–1.6)
Globulin, Total: 3.1 g/dL (ref 2.0–4.5)
IgA/Immunoglobulin A, Serum: 169 mg/dL (ref 91–414)
IgG (Immunoglobin G), Serum: 1321 mg/dL (ref 700–1600)
IgM (Immunoglobulin M), Srm: 118 mg/dL (ref 40–230)
Total Protein: 7.1 g/dL (ref 6.0–8.5)

## 2013-03-02 LAB — RHEUMATOID FACTOR: Rhuematoid fact SerPl-aCnc: <7 IU/mL (ref 0.0–13.9)

## 2013-03-02 LAB — ALDOLASE: Aldolase: 6.6 U/L (ref 1.2–7.6)

## 2013-03-02 LAB — SEDIMENTATION RATE: Sed Rate: 33 mm/hr (ref 0–40)

## 2013-03-02 LAB — CK TOTAL AND CKMB (NOT AT ARMC)
CK-MB Index: 2.8 ng/mL (ref 0.0–2.9)
Total CK: 438 U/L — ABNORMAL HIGH (ref 24–173)

## 2013-03-02 LAB — C-REACTIVE PROTEIN: CRP: 0.6 mg/L (ref 0.0–4.9)

## 2013-03-02 LAB — ANA W/REFLEX: Anti Nuclear Antibody(ANA): NEGATIVE

## 2013-03-02 LAB — RPR: RPR: NONREACTIVE

## 2013-03-02 LAB — VITAMIN E: Vitamin E (Alpha Tocopherol): 11.6 mg/L (ref 4.6–17.8)

## 2013-03-02 NOTE — Progress Notes (Signed)
Quick Note:  Please call patient at the labs we checked for the most part were completely normal and this includes vitamin E., autoimmune profile, inflammatory markers. Her CK which is a muscle enzyme was elevated in the 400 range. I am not sure what this is from. Has she fallen? Please ask her. Also please ask her if she is taking a cholesterol medication which is not listed on her medication list. This could be something like Lipitor or simvastatin or pravastatin. Please inquire and we should await other test results but may need to repeat this muscle enzyme test. It sometimes can indicate muscle breakdown. It is not critically elevated at this point and we may need to just recheck it down the Road. Huston Foley, MD, PhD [facility] ([facility]  ______

## 2013-03-03 NOTE — Progress Notes (Signed)
Quick Note:  Spoke with patient and relayed results of blood work. The patient was also reminded of any future appointments. Patient understood and had no questions. Last fall was approximately 6 months ago and pt reported that she stopped taking Lipitor approx 1 year ago. [name] is agreeable to future CK checks.   ______

## 2013-03-04 ENCOUNTER — Ambulatory Visit (INDEPENDENT_AMBULATORY_CARE_PROVIDER_SITE_OTHER): Payer: Medicare Other

## 2013-03-04 DIAGNOSIS — R200 Anesthesia of skin: Secondary | ICD-10-CM

## 2013-03-04 DIAGNOSIS — R209 Unspecified disturbances of skin sensation: Secondary | ICD-10-CM

## 2013-03-04 DIAGNOSIS — R110 Nausea: Secondary | ICD-10-CM

## 2013-03-04 DIAGNOSIS — R51 Headache: Secondary | ICD-10-CM

## 2013-03-04 DIAGNOSIS — R29898 Other symptoms and signs involving the musculoskeletal system: Secondary | ICD-10-CM

## 2013-03-04 NOTE — Procedures (Signed)
  History:  [name] is a 67 year old patient with a history of episodic bilateral arm numbness and weakness. The episodes may last 3-5 minutes. The patient also has a history of headaches, and a history of TIA. The patient is being evaluated for the episodes of transient arm weakness.  This is a routine EEG. No skull defects are noted. Medications include amitriptyline, iron supplementation, Lasix, hydrochlorothiazide, metoprolol, Xarelto, spironolactone, and verapamil.  EEG classification: Normal awake and asleep  Description of the recording: The background rhythms of this recording consists of a fairly well modulated medium amplitude background activity of 9 Hz. As the record progresses, the patient initially is in the waking state, but appears to enter the early stage II sleep during the recording, with rudimentary sleep spindles and vertex sharp wave activity seen. During the wakeful state, photic stimulation is performed, and this results in a bilateral and symmetric photic driving response. Hyperventilation was also performed, and this results in a minimal buildup of the background rhythm activities without significant slowing seen. At no time during the recording does there appear to be evidence of spike or spike wave discharges or evidence of focal slowing. EKG monitor shows no evidence of cardiac rhythm abnormalities with a heart rate of 66.  Impression: This is a normal EEG recording in the waking and sleeping state. No evidence of ictal or interictal discharges were seen at any time during the recording.

## 2013-03-05 NOTE — Progress Notes (Signed)
Quick Note:  Please call and advise the patient that the EEG or brain wave test we performed was reported as normal in the awake and sleep states. We checked for abnormal electrical activity, but the report suggested normal findings. No further action is required on this test at this time. Please remind patient to keep any upcoming appointments or tests and to call us with any interim questions, concerns, problems or updates. Thanks,  Huston Foley, MD, PhD    ______

## 2013-03-05 NOTE — Progress Notes (Signed)
Quick Note:  Left a message on the pt's home voice mail (vm was in the patient's voice and she stated her name) regarding her recent EEG being within normal limits. Contact information was given so that she may call with any questions or concerns.   ______

## 2013-03-08 DIAGNOSIS — R51 Headache: Secondary | ICD-10-CM

## 2013-03-08 DIAGNOSIS — R209 Unspecified disturbances of skin sensation: Secondary | ICD-10-CM

## 2013-03-08 DIAGNOSIS — R29898 Other symptoms and signs involving the musculoskeletal system: Secondary | ICD-10-CM

## 2013-03-10 ENCOUNTER — Encounter (INDEPENDENT_AMBULATORY_CARE_PROVIDER_SITE_OTHER): Payer: Medicare Other

## 2013-03-10 ENCOUNTER — Encounter: Payer: Self-pay | Admitting: Family Medicine

## 2013-03-10 ENCOUNTER — Ambulatory Visit (INDEPENDENT_AMBULATORY_CARE_PROVIDER_SITE_OTHER): Payer: Medicare Other | Admitting: Neurology

## 2013-03-10 DIAGNOSIS — Z0289 Encounter for other administrative examinations: Secondary | ICD-10-CM

## 2013-03-10 DIAGNOSIS — R29898 Other symptoms and signs involving the musculoskeletal system: Secondary | ICD-10-CM

## 2013-03-10 DIAGNOSIS — R110 Nausea: Secondary | ICD-10-CM

## 2013-03-10 DIAGNOSIS — G5600 Carpal tunnel syndrome, unspecified upper limb: Secondary | ICD-10-CM

## 2013-03-10 DIAGNOSIS — G542 Cervical root disorders, not elsewhere classified: Secondary | ICD-10-CM

## 2013-03-10 DIAGNOSIS — R51 Headache: Secondary | ICD-10-CM

## 2013-03-10 DIAGNOSIS — R200 Anesthesia of skin: Secondary | ICD-10-CM

## 2013-03-10 NOTE — Procedures (Signed)
HISTORY:   [name] is a 67 year old patient with a several month history of episodic bilateral upper extremity heaviness, numbness, associated with headache. Episodes may last 3-5 minutes, and will then resolve. The patient has a history of carpal tunnel syndrome, with surgery on the right side. The patient has had some neck discomfort. The episodes may be associated with nausea. The patient is being evaluated for a possible neuropathy or a cervical radiculopathy.  NERVE CONDUCTION STUDIES:  Nerve conduction studies were performed on both upper extremities. The distal motor latencies for the median nerves were prolonged bilaterally, right greater than left. The motor amplitude for the right median nerve was low, normal on the left. The distal motor latencies and motor amplitudes for the ulnar nerves were normal bilaterally. The F wave latencies for the median nerves were prolonged on the right, normal on the left, and normal for the ulnar nerves bilaterally. The nerve conduction velocities for the median and ulnar nerves were normal bilaterally. The sensory latencies for the median nerves were absent on the right, prolonged on the left, and normal for the ulnar nerves bilaterally.  EMG STUDIES:  EMG study was performed on the left upper extremity:  The first dorsal interosseous muscle reveals 2 to 5 K units with decreased recruitment. No fibrillations or positive waves were noted. The abductor pollicis brevis muscle reveals 2 to 5 K units with decreased recruitment. No fibrillations or positive waves were noted. The extensor indicis proprius muscle reveals 2 to 6 K units with moderately decreased recruitment. No fibrillations or positive waves were noted. The pronator teres muscle reveals 2 to 3 K units with full recruitment. No fibrillations or positive waves were noted. The biceps muscle reveals 1 to 2 K units with full recruitment. No fibrillations or positive waves were noted. The  triceps muscle reveals 2 to 4 K units with full recruitment. No fibrillations or positive waves were noted. Complex repetitive discharges were seen. The anterior deltoid muscle reveals 2 to 3 K units with full recruitment. No fibrillations or positive waves were noted. The cervical paraspinal muscles were tested at 2 levels. No fibrillations or positive waves were seen at either level. Complex repetitive discharges were seen at the lower level. There was fair relaxation.  EMG study was performed on the right upper extremity:  The first dorsal interosseous muscle reveals 2 to 5 K units with decreased recruitment. No fibrillations or positive waves were noted. The abductor pollicis brevis muscle reveals 2 to 4 K units with decreased recruitment. No fibrillations or positive waves were noted. The extensor indicis proprius muscle reveals 2 to 8 K units with moderately decreased recruitment. No fibrillations or positive waves were noted. The pronator teres muscle reveals 2 to 3 K units with full recruitment. No fibrillations or positive waves were noted. The biceps muscle reveals 1 to 2 K units with full recruitment. No fibrillations or positive waves were noted. The triceps muscle reveals 2 to 4 K units with full recruitment. No fibrillations or positive waves were noted. The anterior deltoid muscle reveals 2 to 3 K units with full recruitment. No fibrillations or positive waves were noted. The cervical paraspinal muscles were tested at 2 levels. No fibrillations or positive waves were seen at either level. Complex repetitive discharges were seen at the lower level. There was fair relaxation.   IMPRESSION:  Nerve conduction studies done on both upper extremities shows evidence of bilateral carpal tunnel syndrome of moderate severity on the right, mild severity on the  left. EMG evaluations of both upper extremities show similar findings consistent with bilateral chronic stable C8 radiculopathies. No  other significant abnormalities were seen.  Marlan Palau MD 03/10/2013 4:32 PM  [facility] [address] [place], [place] [address]  Phone [phone] Fax [phone]

## 2013-03-11 ENCOUNTER — Other Ambulatory Visit: Payer: Self-pay | Admitting: Diagnostic Neuroimaging

## 2013-03-11 DIAGNOSIS — R110 Nausea: Secondary | ICD-10-CM

## 2013-03-11 DIAGNOSIS — R200 Anesthesia of skin: Secondary | ICD-10-CM

## 2013-03-11 DIAGNOSIS — R51 Headache: Secondary | ICD-10-CM

## 2013-03-11 DIAGNOSIS — R29898 Other symptoms and signs involving the musculoskeletal system: Secondary | ICD-10-CM

## 2013-03-11 NOTE — Progress Notes (Signed)
Quick Note:  Please advise patient that her nerve and muscle electrical test was essentially indicative of carpal tunnel syndrome bilaterally. However her neck MRI does suggest quite significant degenerative spine disease impinging on some of the exiting nerve roots. This can cause her numbness and tingling that she has been experiencing and to that end I would suggest a neurosurgical evaluation. If she is okay I would like to send her to neurosurgery for consultation. Please let me know so I can enter the consult request. Thanks Huston Foley, MD, PhD [facility] ([facility] ______

## 2013-03-12 ENCOUNTER — Telehealth: Payer: Self-pay

## 2013-03-12 NOTE — Telephone Encounter (Signed)
Message copied by [facility] on Fri Mar 12, 2013  4:21 PM ------      Message from: Huston Foley      Created: Thu Mar 11, 2013  5:27 PM       Please advise patient that her nerve and muscle electrical test was essentially indicative of carpal tunnel syndrome bilaterally. However her neck MRI does suggest quite significant degenerative spine disease impinging on some of the exiting nerve roots. This can cause her numbness and tingling that she has been experiencing and to that end I would suggest a neurosurgical evaluation. If she is okay I would like to send her to neurosurgery for consultation. Please let me know so I can enter the consult request. Thanks      Huston Foley, MD, PhD      [facility] ([facility] ------

## 2013-03-16 ENCOUNTER — Telehealth: Payer: Self-pay

## 2013-03-16 DIAGNOSIS — M489 Spondylopathy, unspecified: Secondary | ICD-10-CM

## 2013-03-16 DIAGNOSIS — R29898 Other symptoms and signs involving the musculoskeletal system: Secondary | ICD-10-CM

## 2013-03-16 NOTE — Telephone Encounter (Signed)
Message copied by [facility] on Tue Mar 16, 2013  8:23 AM ------      Message from: Huston Foley      Created: Thu Mar 11, 2013  5:27 PM       Please advise patient that her nerve and muscle electrical test was essentially indicative of carpal tunnel syndrome bilaterally. However her neck MRI does suggest quite significant degenerative spine disease impinging on some of the exiting nerve roots. This can cause her numbness and tingling that she has been experiencing and to that end I would suggest a neurosurgical evaluation. If she is okay I would like to send her to neurosurgery for consultation. Please let me know so I can enter the consult request. Thanks      Huston Foley, MD, PhD      [facility] ([facility] ------

## 2013-03-16 NOTE — Telephone Encounter (Signed)
Please initiate referral to NSG, thx

## 2013-03-16 NOTE — Telephone Encounter (Signed)
Late Entry: I called patient on March 12, 2013 and reviewed findings with her. Patient would like to be referred for a neurosurgical consult and said, 'Please go ahead and make that so she can see if she can get rid of this pain'.

## 2013-03-18 ENCOUNTER — Telehealth: Payer: Self-pay | Admitting: *Deleted

## 2013-03-19 NOTE — Telephone Encounter (Signed)
pls see phone note from 03/12/13 for this

## 2013-03-19 NOTE — Telephone Encounter (Signed)
I called and gave the MRI results ot [facility], son.  I gave him the MRI brain, MRA head and MRI C spine results.  Referral to Neurosurgery in place.  Gave him contact number for them [facility]).  He verbalized understanding.

## 2013-03-22 ENCOUNTER — Other Ambulatory Visit: Payer: Self-pay

## 2013-03-22 DIAGNOSIS — M5412 Radiculopathy, cervical region: Secondary | ICD-10-CM

## 2013-04-04 ENCOUNTER — Other Ambulatory Visit: Payer: Self-pay | Admitting: Family Medicine

## 2013-04-11 ENCOUNTER — Other Ambulatory Visit: Payer: Self-pay | Admitting: Critical Care Medicine

## 2013-04-12 ENCOUNTER — Other Ambulatory Visit: Payer: Self-pay | Admitting: Critical Care Medicine

## 2013-04-12 ENCOUNTER — Other Ambulatory Visit: Payer: Self-pay | Admitting: Family Medicine

## 2013-04-12 ENCOUNTER — Telehealth: Payer: Self-pay | Admitting: Critical Care Medicine

## 2013-04-12 MED ORDER — RIVAROXABAN 20 MG PO TABS: 20.0000 mg | ORAL_TABLET | Freq: Every day | ORAL | Status: DC

## 2013-04-12 NOTE — Telephone Encounter (Signed)
Spoke with patient-  Patient is currently taking 20mg  Xarelto x 1 year, would like to know if Dr. Delford Field wants her to remain on this medication as she only has 5 tablets remaining and will need a refill. Patient states she has not had any blood work in the 1 year she has been taking this and wants to know if this is ok. Patient states she loves being on the Xarelto and thinks it works much better than the coumadin. Dr. Delford Field please advise,thank you!  Last OV: 01/01/13 w 6 month F/U not scheduled at this time  Current Outpatient Prescriptions on File Prior to Visit  Medication Sig Dispense Refill  . amitriptyline (ELAVIL) 25 MG tablet 1/2 pill each bedtime x 1 week, then 1 pill nightly x 1 week, then 1 1/2 pills nightly x 1 week, then 2 pills nightly thereafter.  60 tablet  3  . ferrous sulfate 325 (65 FE) MG tablet TAKE 1 TABLET TWICE A DAY  60 tablet  3  . furosemide (LASIX) 20 MG tablet Take 20 mg by mouth See admin instructions. Taking 1 tablet for 2 days, then skips for 1-2 days and repeats      . hydrochlorothiazide (HYDRODIURIL) 25 MG tablet Take 25 mg by mouth daily.      . metoprolol succinate (TOPROL-XL) 50 MG 24 hr tablet TAKE 1 TABLET DAILY WITH OR IMMEDIATELY FOLLOWING A MEAL  30 tablet  2  . spironolactone (ALDACTONE) 25 MG tablet Take 25 mg by mouth daily.      . verapamil (CALAN-SR) 240 MG CR tablet TAKE 1 TABLET AT BEDTIME  30 tablet  2  . XARELTO 20 MG TABS tablet TAKE 1 TABLET BY MOUTH EVERY EVENING WITH A MEAL  30 tablet  4   No current facility-administered medications on file prior to visit.     No Known Allergies

## 2013-04-12 NOTE — Telephone Encounter (Signed)
Yes renew xarelto. Needs to stay on this medication No labs needed

## 2013-04-22 ENCOUNTER — Ambulatory Visit (INDEPENDENT_AMBULATORY_CARE_PROVIDER_SITE_OTHER): Payer: Medicare Other | Admitting: Family Medicine

## 2013-04-22 ENCOUNTER — Encounter: Payer: Self-pay | Admitting: Family Medicine

## 2013-04-22 VITALS — BP 126/78 | HR 71 | Ht 62.5 in | Wt 263.0 lb

## 2013-04-22 DIAGNOSIS — M1710 Unilateral primary osteoarthritis, unspecified knee: Secondary | ICD-10-CM

## 2013-04-22 DIAGNOSIS — Z23 Encounter for immunization: Secondary | ICD-10-CM

## 2013-04-22 DIAGNOSIS — M170 Bilateral primary osteoarthritis of knee: Secondary | ICD-10-CM | POA: Insufficient documentation

## 2013-04-22 DIAGNOSIS — M25561 Pain in right knee: Secondary | ICD-10-CM

## 2013-04-22 DIAGNOSIS — M25569 Pain in unspecified knee: Secondary | ICD-10-CM

## 2013-04-22 DIAGNOSIS — E785 Hyperlipidemia, unspecified: Secondary | ICD-10-CM

## 2013-04-22 MED ORDER — METHYLPREDNISOLONE ACETATE 80 MG/ML IJ SUSP
80.0000 mg | Freq: Once | INTRAMUSCULAR | Status: AC
  Administered 2013-04-22: 80 mg via INTRA_ARTICULAR

## 2013-04-22 NOTE — Assessment & Plan Note (Signed)
Likely needs statin, but did not tolerate in past and CK elevated in July. Pt will return for fasting lipid panel and repeat CK, which we will use to guide whether to rx statin.

## 2013-04-22 NOTE — Progress Notes (Signed)
Patient ID: [name], female   DOB: 04/02/1946, 67 y.o.   MRN: [id number]   HPI:  Knee pain: has had cortisone shots before. Has had "gel" put in knees as well. Last shot in knees was here about 3 months ago. Hurts to walk when shots wears off. The medicine from shots lasts her about 3 months, has good results from it. The pain has picked up back recently.   Hyperlipidemia: discussed results of LDL with pt, and that she would benefit from statin therapy. Her CK was elevated in July when checked by neurologist. Pt states that she used to take lipitor but could not tolerate it because of reflux symptoms. Would be willing to take statin if she did not have reflux from it.  ROS: See HPI  PMFSH: recently seen by neuro & neurosurgery, no plans for surgical intervention. Also recently had cortisone injection of wrist by hand surgeon, helped her pain there. Has hx of bilateral hip replacements, walks with cane.   PHYSICAL EXAM: BP 126/78  Pulse 71  Ht 5' 2.5" (1.588 m)  Wt 263 lb (119.296 kg)  BMI 47.31 kg/m2 Gen: NAD, morbidly obese Heart: RRR Lungs: CTAB Neuro: grossly nonfocal, speech intact Ext: bilateral knees with crepitus. tenderness in anterior joint line, also medial to joint line bilaterally. No effusion or redness noted. Good strength in legs.  PROCEDURE NOTE: Bilateral knee injections  After informed written consent was obtained, patient was seated on exam table. Both knees were prepped with betadine x 2 followed by alcohol swab. Utilizing anterolateral approach, each of patient's knees were separately injected intraarticularly with mixture of 0.5cc of depomedrol 80mg /mL and 3cc of 1% lidocaine without epinephrine. Patient tolerated the procedure well without immediate complications.   ASSESSMENT/PLAN:  # Health maintenance:  -flu shot given today

## 2013-04-22 NOTE — Patient Instructions (Signed)
Take it easy tonight. Ice the knees. If you notice any redness or swelling, call us or return to the clinic.  Come back one morning for fasting labs.  Be well, Dr. Pollie Meyer    Knee Injection Joint injections are shots. Your caregiver will place a needle into your knee joint. The needle is used to put medicine into the joint. These shots can be used to help treat different painful knee conditions such as osteoarthritis, bursitis, local flare-ups of rheumatoid arthritis, and pseudogout. Anti-inflammatory medicines such as corticosteroids and anesthetics are the most common medicines used for joint and soft tissue injections.  PROCEDURE  The skin over the kneecap will be cleaned with an antiseptic solution.  Your caregiver will inject a small amount of a local anesthetic (a medicine like Novocaine) just under the skin in the area that was cleaned.  After the area becomes numb, a second injection is done. This second injection usually includes an anesthetic and an anti-inflammatory medicine called a steroid or cortisone. The needle is carefully placed in between the kneecap and the knee, and the medicine is injected into the joint space.  After the injection is done, the needle is removed. Your caregiver may place a bandage over the injection site. The whole procedure takes no more than a couple of minutes. BEFORE THE PROCEDURE  Wash all of the skin around the entire knee area. Try to remove any loose, scaling skin. There is no other specific preparation necessary unless advised otherwise by your caregiver. LET YOUR CAREGIVER KNOW ABOUT:   Allergies.  Medications taken including herbs, eye drops, over the counter medications, and creams.  Use of steroids (by mouth or creams).  Possible pregnancy, if applicable.  Previous problems with anesthetics or Novocaine.  History of blood clots (thrombophlebitis).  History of bleeding or blood problems.  Previous surgery.  Other health  problems. RISKS AND COMPLICATIONS Side effects from cortisone shots are rare. They include:   Slight bruising of the skin.  Shrinkage of the normal fatty tissue under the skin where the shot was given.  Increase in pain after the shot.  Infection.  Weakening of tendons or tendon rupture.  Allergic reaction to the medicine.  Diabetics may have a temporary increase in their blood sugar after a shot.  Cortisone can temporarily weaken the immune system. While receiving these shots, you should not get certain vaccines. Also, avoid contact with anyone who has chickenpox or measles. Especially if you have never had these diseases or have not been previously immunized. Your immune system may not be strong enough to fight off the infection while the cortisone is in your system. AFTER THE PROCEDURE   You can go home after the procedure.  You may need to put ice on the joint 15-20 minutes every 3 or 4 hours until the pain goes away.  You may need to put an elastic bandage on the joint. HOME CARE INSTRUCTIONS   Only take over-the-counter or prescription medicines for pain, discomfort, or fever as directed by your caregiver.  You should avoid stressing the joint. Unless advised otherwise, avoid activities that put a lot of pressure on a knee joint, such as:  Jogging.  Bicycling.  Recreational climbing.  Hiking.  Laying down and elevating the leg/knee above the level of your heart can help to minimize swelling. SEEK MEDICAL CARE IF:   You have repeated or worsening swelling.  There is drainage from the puncture area.  You develop red streaking that extends above or  below the site where the needle was inserted. SEEK IMMEDIATE MEDICAL CARE IF:   You develop a fever.  You have pain that gets worse even though you are taking pain medicine.  The area is red and warm, and you have trouble moving the joint. MAKE SURE YOU:   Understand these instructions.  Will watch your  condition.  Will get help right away if you are not doing well or get worse. Document Released: 10/13/2006 Document Revised: 10/14/2011 Document Reviewed: 07/10/2007 [facility] Patient Information 2014 [place], [place].

## 2013-04-22 NOTE — Assessment & Plan Note (Signed)
Injected both knees today. Given aftercare instructions.

## 2013-06-01 ENCOUNTER — Other Ambulatory Visit: Payer: Self-pay | Admitting: Family Medicine

## 2013-06-30 ENCOUNTER — Ambulatory Visit: Payer: Medicare Other | Admitting: Emergency Medicine

## 2013-07-05 ENCOUNTER — Ambulatory Visit: Payer: Medicare Other | Admitting: Emergency Medicine

## 2013-07-11 ENCOUNTER — Other Ambulatory Visit: Payer: Self-pay | Admitting: Family Medicine

## 2013-07-16 ENCOUNTER — Encounter: Payer: Self-pay | Admitting: Sports Medicine

## 2013-07-16 ENCOUNTER — Ambulatory Visit (INDEPENDENT_AMBULATORY_CARE_PROVIDER_SITE_OTHER): Payer: Medicare Other | Admitting: Sports Medicine

## 2013-07-16 ENCOUNTER — Telehealth: Payer: Self-pay | Admitting: Family Medicine

## 2013-07-16 VITALS — BP 163/77 | HR 88 | Temp 98.6°F | Ht 62.5 in | Wt 264.0 lb

## 2013-07-16 DIAGNOSIS — M25579 Pain in unspecified ankle and joints of unspecified foot: Secondary | ICD-10-CM | POA: Insufficient documentation

## 2013-07-16 DIAGNOSIS — I2699 Other pulmonary embolism without acute cor pulmonale: Secondary | ICD-10-CM

## 2013-07-16 DIAGNOSIS — M25571 Pain in right ankle and joints of right foot: Secondary | ICD-10-CM

## 2013-07-16 DIAGNOSIS — I10 Essential (primary) hypertension: Secondary | ICD-10-CM

## 2013-07-16 LAB — COMPREHENSIVE METABOLIC PANEL
ALT: 8 U/L (ref 0–35)
AST: 13 U/L (ref 0–37)
Albumin: 4.2 g/dL (ref 3.5–5.2)
Alkaline Phosphatase: 70 U/L (ref 39–117)
BUN: 22 mg/dL (ref 6–23)
CO2: 27 mEq/L (ref 19–32)
Calcium: 9.7 mg/dL (ref 8.4–10.5)
Chloride: 103 mEq/L (ref 96–112)
Creat: 1.18 mg/dL — ABNORMAL HIGH (ref 0.50–1.10)
Glucose, Bld: 95 mg/dL (ref 70–99)
Potassium: 3.6 mEq/L (ref 3.5–5.3)
Sodium: 139 mEq/L (ref 135–145)
Total Bilirubin: 0.4 mg/dL (ref 0.3–1.2)
Total Protein: 7.4 g/dL (ref 6.0–8.3)

## 2013-07-16 LAB — URIC ACID: Uric Acid, Serum: 9.5 mg/dL — ABNORMAL HIGH (ref 2.4–7.0)

## 2013-07-16 LAB — CBC
HCT: 36.7 % (ref 36.0–46.0)
Hemoglobin: 12.4 g/dL (ref 12.0–15.0)
MCH: 29.3 pg (ref 26.0–34.0)
MCHC: 33.8 g/dL (ref 30.0–36.0)
MCV: 86.8 fL (ref 78.0–100.0)
Platelets: 387 10*3/uL (ref 150–400)
RBC: 4.23 MIL/uL (ref 3.87–5.11)
RDW: 14.7 % (ref 11.5–15.5)
WBC: 8.5 10*3/uL (ref 4.0–10.5)

## 2013-07-16 MED ORDER — PREDNISONE 20 MG PO TABS: ORAL_TABLET | ORAL | Status: DC

## 2013-07-16 MED ORDER — COLCHICINE 0.6 MG PO TABS: 0.3000 mg | ORAL_TABLET | Freq: Every day | ORAL | Status: DC

## 2013-07-16 NOTE — Assessment & Plan Note (Signed)
Patient is to continue her Xarelto.  Already has an IVC filter.  She reports 100% compliance with meds.  This could represent a DVT however  this seems very low likelihood.

## 2013-07-16 NOTE — Patient Instructions (Addendum)
Steroid Taper per instructions  Colchicine for 5-7 days or until improved  Stop your HCTZ  Follow up with Dr. Pollie Meyer in 1-2 weeks to discuss the results of your test    If you need anything prior to your next visit please call the clinic. Please Bring all medications or accurate medication list with you to each appointment; an accurate medication list is essential in providing you the best care possible.    Gout Gout is an inflammatory arthritis caused by a buildup of uric acid crystals in the joints. Uric acid is a chemical that is normally present in the blood. When the level of uric acid in the blood is too high it can form crystals that deposit in your joints and tissues. This causes joint redness, soreness, and swelling (inflammation). Repeat attacks are common. Over time, uric acid crystals can form into masses (tophi) near a joint, destroying bone and causing disfigurement. Gout is treatable and often preventable. CAUSES  The disease begins with elevated levels of uric acid in the blood. Uric acid is produced by your body when it breaks down a naturally found substance called purines. Certain foods you eat, such as meats and fish, contain high amounts of purines. Causes of an elevated uric acid level include:  Being passed down from parent to child (heredity).  Diseases that cause increased uric acid production (such as obesity, psoriasis, and certain cancers).  Excessive alcohol use.  Diet, especially diets rich in meat and seafood.  Medicines, including certain cancer-fighting medicines (chemotherapy), water pills (diuretics), and aspirin.  Chronic kidney disease. The kidneys are no longer able to remove uric acid well.  Problems with metabolism. Conditions strongly associated with gout include:  Obesity.  High blood pressure.  High cholesterol.  Diabetes. Not everyone with elevated uric acid levels gets gout. It is not understood why some people get gout and  others do not. Surgery, joint injury, and eating too much of certain foods are some of the factors that can lead to gout attacks. SYMPTOMS   An attack of gout comes on quickly. It causes intense pain with redness, swelling, and warmth in a joint.  Fever can occur.  Often, only one joint is involved. Certain joints are more commonly involved:  Base of the big toe.  Knee.  Ankle.  Wrist.  Finger. Without treatment, an attack usually goes away in a few days to weeks. Between attacks, you usually will not have symptoms, which is different from many other forms of arthritis. DIAGNOSIS  Your caregiver will suspect gout based on your symptoms and exam. In some cases, tests may be recommended. The tests may include:  Blood tests.  Urine tests.  X-rays.  Joint fluid exam. This exam requires a needle to remove fluid from the joint (arthrocentesis). Using a microscope, gout is confirmed when uric acid crystals are seen in the joint fluid. TREATMENT  There are two phases to gout treatment: treating the sudden onset (acute) attack and preventing attacks (prophylaxis).  Treatment of an Acute Attack.  Medicines are used. These include anti-inflammatory medicines or steroid medicines.  An injection of steroid medicine into the affected joint is sometimes necessary.  The painful joint is rested. Movement can worsen the arthritis.  You may use warm or cold treatments on painful joints, depending which works best for you.  Treatment to Prevent Attacks.  If you suffer from frequent gout attacks, your caregiver may advise preventive medicine. These medicines are started after the acute attack subsides.  These medicines either help your kidneys eliminate uric acid from your body or decrease your uric acid production. You may need to stay on these medicines for a very long time.  The early phase of treatment with preventive medicine can be associated with an increase in acute gout attacks. For  this reason, during the first few months of treatment, your caregiver may also advise you to take medicines usually used for acute gout treatment. Be sure you understand your caregiver's directions. Your caregiver may make several adjustments to your medicine dose before these medicines are effective.  Discuss dietary treatment with your caregiver or dietitian. Alcohol and drinks high in sugar and fructose and foods such as meat, poultry, and seafood can increase uric acid levels. Your caregiver or dietician can advise you on drinks and foods that should be limited. HOME CARE INSTRUCTIONS   Do not take aspirin to relieve pain. This raises uric acid levels.  Only take over-the-counter or prescription medicines for pain, discomfort, or fever as directed by your caregiver.  Rest the joint as much as possible. When in bed, keep sheets and blankets off painful areas.  Keep the affected joint raised (elevated).  Apply warm or cold treatments to painful joints. Use of warm or cold treatments depends on which works best for you.  Use crutches if the painful joint is in your leg.  Drink enough fluids to keep your urine clear or pale yellow. This helps your body get rid of uric acid. Limit alcohol, sugary drinks, and fructose drinks.  Follow your dietary instructions. Pay careful attention to the amount of protein you eat. Your daily diet should emphasize fruits, vegetables, whole grains, and fat-free or low-fat milk products. Discuss the use of coffee, vitamin C, and cherries with your caregiver or dietician. These may be helpful in lowering uric acid levels.  Maintain a healthy body weight. SEEK MEDICAL CARE IF:   You develop diarrhea, vomiting, or any side effects from medicines.  You do not feel better in 24 hours, or you are getting worse. SEEK IMMEDIATE MEDICAL CARE IF:   Your joint becomes suddenly more tender, and you have chills or a fever. MAKE SURE YOU:   Understand these  instructions.  Will watch your condition.  Will get help right away if you are not doing well or get worse. Document Released: 07/19/2000 Document Revised: 11/16/2012 Document Reviewed: 03/04/2012 [facility] Patient Information 2014 [place], [place].

## 2013-07-16 NOTE — Assessment & Plan Note (Signed)
Stop HCTZ given worsening with gout. Followup with PCP to further discuss hypertension management

## 2013-07-16 NOTE — Telephone Encounter (Signed)
Pt dropped off form to be filled out regarding renewal of handicap sticker.

## 2013-07-16 NOTE — Progress Notes (Signed)
  [name] - 67 y.o. female MRN [id number]  Date of birth: 07/29/1946  CC, HPI, INTERVAL HISTORY & ROS  [name] is here today for same day visit for R lower leg swelling.    She reports that the dorsum of her foot has become red, painful and swollen since last Sunday.  This is progressively worsening she is having difficulty with any type of plantar flexion of the first MCP.  She has not tried any medications.  She has never had anything like this before  Pt denies chest pain, dyspnea at rest or exertion, PND, lower extremity edema.  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 History of CKD. stage III No known diabetes  Past Medical, Surgical, Social, and Family History Reviewed per EMR Medications and Allergies reviewed and all updated if necessary. Objective Findings  VITALS: HR: 88 bpm  BP: 163/77 mmHg  TEMP: 98.6 F (37 C) (Oral)  RESP:    HT: 5' 2.5" (158.8 cm)  WT: 264 lb (119.75 kg)  BMI: 47.6   BP Readings from Last 3 Encounters:  07/16/13 163/77  04/22/13 126/78  02/26/13 123/77   Wt Readings from Last 3 Encounters:  07/16/13 264 lb (119.75 kg)  04/22/13 263 lb (119.296 kg)  02/26/13 257 lb (116.574 kg)     PHYSICAL EXAM: GENERAL:  adult obese African American female. In no discomfort; no respiratory distress  PSYCH: alert and appropriate, good insight   HNEENT:   CARDIO:   LUNGS:   ABDOMEN:   EXTREM:   right lower extremity with significant erythema on the dorsum of the distal row.  Negative Homans. Leg measurements right than left are: Foot 26.5 cm, 25.0 cm  Ankle 26.5 cm, 25.0 cm  Calf 49.5 cm, 50.5 cm   Right foot Exam: Appear:  erythematous across the dorsum of the distal row.    Palp:  tennis palpation over the distal row with 1+ pitting edema   ROM:  painful range of motion of the  distal row   NV:   good capillary refill   Testing:      GU:   SKIN:     Assessment & Plan   Problems addressed today: General Plan & Pt Instructions:  1. Pain in joint, ankle and foot, right   2. PULMONARY EMBOLISM       Steroid Taper per instructions  Stop HCTZ  Colchicine for 5-7 days or until improved  Follow up with Dr. Pollie Sharp in 1-2 weeks to discuss the results of your test     For further discussion of A/P and for follow up issues see problem based charting if applicable.

## 2013-07-16 NOTE — Assessment & Plan Note (Addendum)
Entire MTPs warm and erythematous with significant pain over the first MTP specifically.  This likely her presents first occurrence of gout.  We'll obtain basic labs as well as a uric acid  Level.  Patient does have some mild renal insufficiency and will be given a steroid burst instead of NSAIDs.  > Follow up with PCP to discuss prophylaxis and to review labs

## 2013-07-19 ENCOUNTER — Encounter: Payer: Self-pay | Admitting: Sports Medicine

## 2013-07-19 NOTE — Telephone Encounter (Signed)
Form placed in MD box.  Zenith Kercheval L, CMA  

## 2013-07-22 NOTE — Telephone Encounter (Signed)
Form signed, will return to [employer].  Latrelle Dodrill, MD

## 2013-07-23 NOTE — Telephone Encounter (Signed)
Pt notified.  Form placed up front for pick up.  Placida Cambre, Darlyne Russian, CMA

## 2013-08-09 ENCOUNTER — Ambulatory Visit: Payer: Medicare Other | Admitting: Critical Care Medicine

## 2013-08-20 ENCOUNTER — Ambulatory Visit: Payer: Medicare Other | Admitting: Family Medicine

## 2013-08-30 ENCOUNTER — Encounter: Payer: Self-pay | Admitting: Critical Care Medicine

## 2013-08-30 ENCOUNTER — Ambulatory Visit (INDEPENDENT_AMBULATORY_CARE_PROVIDER_SITE_OTHER): Payer: Medicare Other | Admitting: Critical Care Medicine

## 2013-08-30 ENCOUNTER — Encounter (INDEPENDENT_AMBULATORY_CARE_PROVIDER_SITE_OTHER): Payer: Self-pay

## 2013-08-30 VITALS — BP 134/82 | HR 77 | Temp 98.1°F | Ht 62.5 in | Wt 275.8 lb

## 2013-08-30 DIAGNOSIS — I2699 Other pulmonary embolism without acute cor pulmonale: Secondary | ICD-10-CM

## 2013-08-30 NOTE — Patient Instructions (Signed)
No changes in Xarelto dosing Return 6 months

## 2013-08-30 NOTE — Progress Notes (Signed)
  Subjective:    Patient ID: [name], female    DOB: 04-10-1946, 68 y.o.   MRN: [id number]  HPI  68 y.o.AAF wiht prior hx of Pulmonary embollii in 1997 with recurrence in 2005 when coumadin dosing was adjusted and INR fell. Pt had IVC filter placed in 2005 with bleeding complications. Pt did well and has been on coumadin since that time.  Pt now in [facility] clinic and being followed. 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 Pt is limited in ambulation.  The patient was switched to Xarelto  summer 2013  08/30/2013 Chief Complaint  Patient presents with  . Follow-up    Breathing doing well.  No SOB, wheezing, chest tightness/pain, or cough.  Doing well on xarelto. Doing well on this.  Cr 1.18.  No bleeding issues.  No complaints.  Mucus in throat is clear. Pt denies any significant sore throat, nasal congestion or excess secretions, fever, chills, sweats, unintended weight loss, pleurtic or exertional chest pain, orthopnea PND, or leg swelling Pt denies any increase in rescue therapy over baseline, denies waking up needing it or having any early am or nocturnal exacerbations of coughing/wheezing/or dyspnea. Pt also denies any obvious fluctuation in symptoms with  weather or environmental change or other alleviating or aggravating factors  Insurance coverage is good.       Past Medical History  Diagnosis Date  . Hyperlipidemia   . 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 Recurrent pulmonary embolism     see pulmonologist Dr. Asencion Noble  . Obesity (BMI 30-39.9)   . DJD (degenerative joint disease) of hip   . Sprain and strain of wrist   . CKD (chronic kidney disease), stage III     pt doesn't know why this is listed  . GERD (gastroesophageal  reflux disease)     h/o in the 1990s  . Heart murmur   . Hypertension     sees Dr. Gilda Crease  . Anemia    No Known Allergies  Review of Systems  Patient denies significant dyspnea,cough, hemoptysis,  chest pain, palpitations, pedal edema, orthopnea, paroxysmal nocturnal dyspnea, lightheadedness, nausea, vomiting, abdominal or  leg pains      Objective:   Physical Exam  BP 134/82  Pulse 77  Temp(Src) 98.1 F (36.7 C) (Oral)  Ht 5' 2.5" (1.588 m)  Wt 275 lb 12.8 oz (125.102 kg)  BMI 49.61 kg/m2  SpO2 100%   Gen. Pleasant, well-nourished, in no distress, normal affect ENT - no lesions, no post nasal drip Neck: No JVD, no thyromegaly, no carotid bruits Lungs: no use of accessory muscles, no dullness to percussion, clear without rales or rhonchi  Cardiovascular: Rhythm regular, heart sounds  normal, no murmurs or gallops, 2+ peripheral edema, chronic venous stasis both LE Abdomen: soft and non-tender, no hepatosplenomegaly, BS normal. Musculoskeletal: No deformities, no cyanosis or clubbing Neuro:  alert, non focal       Assessment & Plan:   PULMONARY EMBOLISM Hx of chronic VTE and IVC filter  Now on lifelong anticoagulation with xarelto Renal function stable Plan  Cont xarelto 20mg  per day for life

## 2013-08-31 NOTE — Assessment & Plan Note (Signed)
Hx of chronic VTE and IVC filter  Now on lifelong anticoagulation with xarelto Renal function stable Plan  Cont xarelto 20mg  per day for life

## 2013-09-16 ENCOUNTER — Ambulatory Visit: Payer: Medicare Other | Admitting: Emergency Medicine

## 2013-09-17 ENCOUNTER — Other Ambulatory Visit: Payer: Self-pay | Admitting: Neurology

## 2013-09-20 ENCOUNTER — Other Ambulatory Visit: Payer: Self-pay | Admitting: Family Medicine

## 2013-09-29 ENCOUNTER — Telehealth: Payer: Self-pay | Admitting: Neurology

## 2013-09-29 ENCOUNTER — Telehealth: Payer: Self-pay | Admitting: Family Medicine

## 2013-09-29 ENCOUNTER — Telehealth: Payer: Self-pay | Admitting: Critical Care Medicine

## 2013-09-29 MED ORDER — AMITRIPTYLINE HCL 25 MG PO TABS: 50.0000 mg | ORAL_TABLET | Freq: Every day | ORAL | Status: DC

## 2013-09-29 MED ORDER — RIVAROXABAN 20 MG PO TABS: 20.0000 mg | ORAL_TABLET | Freq: Every day | ORAL | Status: DC

## 2013-09-29 NOTE — Telephone Encounter (Signed)
Patient requesting refills of amitriptyline HCL 25 [facility] to [facility] [phone].

## 2013-09-29 NOTE — Telephone Encounter (Signed)
Rx sent 

## 2013-09-29 NOTE — Telephone Encounter (Signed)
Pt needed a refill on xarelto. i advised will send in refill. Nothing further needed

## 2013-09-29 NOTE — Telephone Encounter (Signed)
Needs a copy of " all the things that is wrong with me 1) Office of Adminstrative  Attn: Janeece Riggers of Court [phone] [facility] [phone] fax number  2) Dept of Health and Human Resources Attn: CPP Appeals Session [place] Fax: [phone] The telephone hearing is Monday March 2-Time to be determine Medicaid is trying to cut all nursing help for her adopted daughter, [name]  [birth year] This forms needs to be at the hearing  Please advise

## 2013-10-01 ENCOUNTER — Telehealth: Payer: Self-pay | Admitting: Critical Care Medicine

## 2013-10-01 MED ORDER — RIVAROXABAN 20 MG PO TABS: 20.0000 mg | ORAL_TABLET | Freq: Every day | ORAL | Status: DC

## 2013-10-01 NOTE — Telephone Encounter (Signed)
Will fwd. To PCP for review and to address. Thanks. Javier Glazier, Gerrit Heck

## 2013-10-01 NOTE — Telephone Encounter (Signed)
Patient calls again.  °

## 2013-10-01 NOTE — Telephone Encounter (Signed)
This is a medical records issue, not something I need to be involved in. It appears pt needs a copy of her problem list sent to the addresses below. Will forward back to [facility] red team and [facility] admin team.  Leeanne Rio, MD

## 2013-10-01 NOTE — Telephone Encounter (Signed)
No word on PA yet  I have left samples up front  Will forward to CJ to f/u on PA

## 2013-10-01 NOTE — Telephone Encounter (Signed)
Pt is calling to get an update & can be reached at [phone].   Is it possible to get a sample?  [name]

## 2013-10-01 NOTE — Telephone Encounter (Signed)
[facility] and was advised pt xarelto needs PA [phone] Id#: [id number] I called # provided. Was advised due to wait time (45 min) I had to leave a message for a rep to return our call. I called pt to make her aware we are working on this-LMTCB x1

## 2013-10-01 NOTE — Telephone Encounter (Signed)
Faxed requested info to both places. Javier Glazier, Ota Ebersole Called pt. Informed.

## 2013-10-04 NOTE — Telephone Encounter (Signed)
The information faxed previously needs to be faxed to: Hilary Hertz [phone] This is for Farrel Gordon file. File number: [id number]

## 2013-10-04 NOTE — Telephone Encounter (Signed)
Faxed info as requested. Javier Glazier, Gerrit Heck

## 2013-10-04 NOTE — Telephone Encounter (Signed)
Pt asked for this to be sent ASAP.  All this information has to be received before a hearing can be scheduled

## 2013-10-05 NOTE — Telephone Encounter (Signed)
Danae Chen from [place] [phone] (anyone can help with this).  Call so they can get the PA approved

## 2013-10-05 NOTE — Telephone Encounter (Signed)
I called [employer] and was advised to leave a message due to wait time (>53mins). Left message for pt to call back x1  Will route to Crystal that way we know where the message is located.

## 2013-10-07 ENCOUNTER — Ambulatory Visit (INDEPENDENT_AMBULATORY_CARE_PROVIDER_SITE_OTHER): Payer: Medicare Other | Admitting: Neurology

## 2013-10-07 ENCOUNTER — Encounter (INDEPENDENT_AMBULATORY_CARE_PROVIDER_SITE_OTHER): Payer: Self-pay

## 2013-10-07 ENCOUNTER — Encounter: Payer: Self-pay | Admitting: Neurology

## 2013-10-07 VITALS — BP 129/79 | HR 76 | Temp 98.1°F | Ht 62.5 in | Wt 273.0 lb

## 2013-10-07 DIAGNOSIS — R29898 Other symptoms and signs involving the musculoskeletal system: Secondary | ICD-10-CM

## 2013-10-07 DIAGNOSIS — M47812 Spondylosis without myelopathy or radiculopathy, cervical region: Secondary | ICD-10-CM

## 2013-10-07 DIAGNOSIS — G5600 Carpal tunnel syndrome, unspecified upper limb: Secondary | ICD-10-CM

## 2013-10-07 DIAGNOSIS — R748 Abnormal levels of other serum enzymes: Secondary | ICD-10-CM

## 2013-10-07 DIAGNOSIS — R209 Unspecified disturbances of skin sensation: Secondary | ICD-10-CM

## 2013-10-07 DIAGNOSIS — R200 Anesthesia of skin: Secondary | ICD-10-CM

## 2013-10-07 DIAGNOSIS — R519 Headache, unspecified: Secondary | ICD-10-CM

## 2013-10-07 DIAGNOSIS — R51 Headache: Secondary | ICD-10-CM

## 2013-10-07 MED ORDER — AMITRIPTYLINE HCL 25 MG PO TABS: 50.0000 mg | ORAL_TABLET | Freq: Every day | ORAL | Status: DC

## 2013-10-07 NOTE — Telephone Encounter (Signed)
I called [phone] and requested PA form to be faxed to triage fax. Will await fax. [place] Bing, CMA  Form received and completed and placed in PW look-at to sign. Pt aware.  Lavaca Bing, CMA

## 2013-10-07 NOTE — Patient Instructions (Signed)
We can continue with amitriptyline at the current dose. You have degenerative arthritis in your neck which can cause your arm symptoms.

## 2013-10-07 NOTE — Telephone Encounter (Signed)
Pt is calling in & wants an update.  Pt can be reached at [phone].  [name]

## 2013-10-07 NOTE — Progress Notes (Signed)
Subjective:    Patient ID: [name] is a 68 y.o. female.  HPI    Interim history:   [name] is a very pleasant 68 year old right-handed woman with an underlying medical history of chronic kidney disease, recurrent thrombophlebitis and recurrent PE (previously on coumadin, now on Xarelto), hyperlipidemia, anemia, who presents for followup consultation of her episodic arm numbness and weakness. She is unaccompanied today. I first met her on 02/26/2013, at which time she presented with a two-month history of intermittent episodes of weakness affecting her arms as well as numbness associated. This lasted for about 3-5 minutes at a time. She has also had recurrent headaches with associated nausea, but no vomiting and has had a throbbing, bifrontal HA, and the HA last about 3 hours. She had two episodes while driving. At the time of her first visit with me I felt the differential diagnoses included migraines with neurological manifestation, cervical radiculopathy and her exam was nonfocal. I suggested blood work as well as an EEG, EMG and nerve conduction studies as well as a cervical spine MRI. Symptomatically for her recurrent headaches I tried her on low dose amitriptyline.  Today, we talked about all her test results:  Her EEG from 03/04/13 showed: This is a normal EEG recording in the waking and sleeping state. No evidence of ictal or interictal discharges were seen at any time during the recording. Her EMG and nerve conduction studies from 03/10/2013 showed: Nerve conduction studies done on both upper extremities shows evidence of bilateral carpal tunnel syndrome of moderate severity on the right, mild severity on the left. EMG evaluations of both upper extremities show similar findings consistent with bilateral chronic stable C8 radiculopathies. No other significant abnormalities were seen. MRI cervical spine without contrast on 03/08/2013 showed: 1. At C6-7: moderate spinal stenosis and  severe bilateral foraminal stenosis; no cord signal abnormalities, 2. At C5-6, C7-T1: mild-moderate spinal stenosis and severe bilateral foraminal stenosis; no cord signal abnormalities 3. At C3-4, C4-5: mild spinal stenosis and severe bilateral foraminal stenosis; no cord signal abnormalities 4. Multi-level degenerative spondylosis and disc bulging from C3-4 down to C6-7. Reversal of normal cervical curvature at C4 and C5. Lab results from 02/26/2013 showed normal SPEP and immunofluorescent electrophoresis, normal rheumatoid factor, normal vitamin D level, normal ANA, normal RPR, normal ESR, normal aldolase, normal CRP, normal CK-MB but elevated CK of 438. I suggested we recheck it down the [place]. Based on her imaging and EMG test results I referred her to neurosurgery.  Today she reports that amitriptyline has helped her. She saw NSG and there was no intervention needed. 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 She ran out of elavil and had recurrence of HAs. She has noted no SEs. She is now taking 50 mg each night. She stopped the Lipitor d/t GER and her HCTZ was stopped.   She has had an outside TIA workup which included MRI and MRA brain, echocardiogram and carotid Doppler studies which were unremarkable with less than 39% stenosis in b/l ICAs and her MRI/MRA brain from 01/27/13: Normal MRI appearance of the brain. Left sphenoid sinusitis with opacification of greater than 50%. Mild distal small vessel disease. No significant proximal stenosis, aneurysm, or branch vessel occlusion.   Her Past Medical History Is Significant For: Past Medical History  Diagnosis Date  . Hyperlipidemia   . 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 Recurrent pulmonary embolism     see pulmonologist Dr. Asencion Noble  . Obesity (BMI 30-39.9)   .  DJD (degenerative joint disease) of hip   . Sprain and strain of wrist   . CKD (chronic kidney disease), stage III     pt doesn't know why this is listed  . GERD (gastroesophageal reflux disease)     h/o in the 1990s  . Heart murmur   . Hypertension     sees Dr. Gilda Crease  . Anemia     Her Past Surgical History Is Significant For: Past Surgical History  Procedure Laterality Date  . Partial hip arthroplasty  Left, 02/2009    Dr Hal Morales  . Shoulder surgery  Left 07/15/08    Dr Hal Morales  . Femoral vein ligation  12/2002  . [place] filter  11/03/2003    R Jugular  . Total hip arthroplasty  R 05/2203, L 2010  . Tubal ligation  1981  . Transthoracic echocardiogram      EF 55-60%, RV mod dilated, mildly increased LV wall thickness  . Dilation and curettage of uterus  02/21/2012    Procedure: DILATATION AND CURETTAGE;  Surgeon: Emily Filbert, MD;  Location: [facility];  Service: Gynecology;  Laterality: N/A;  . Carpal tunnel release  09/02/2012    Procedure: CARPAL TUNNEL RELEASE;  Surgeon: Winfield Cunas, MD;  Location: [facility];  Service: Neurosurgery;  Laterality: Right;  RIGHT carpal tunnel release    Her Family History Is Significant For: Family History  Problem Relation Age of Onset  . Heart disease Mother   . Heart disease Father   . Cancer Brother   . Cancer Sister     Her Social History Is Significant For: History   Social History  . Marital Status: Widowed    Spouse Name: N/A    Number of Children: 2  . Years of Education: College   Occupational History  .     Social History Main Topics  . Smoking status: Never Smoker   . Smokeless tobacco: Never Used  . Alcohol Use: No  . Drug Use: No  . Sexual Activity: Not Currently    Birth Control/ Protection: None   Other Topics Concern  . 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    Lives at home with  daughter born in '87.    Non  smoker. Non alcohol drinker.;    Fosters children .             Her Allergies Are:  No Known Allergies:   Her Current Medications Are:  Outpatient Encounter Prescriptions as of 10/07/2013  Medication Sig  . amitriptyline (ELAVIL) 25 MG tablet Take 2 tablets (50 mg total) by mouth at bedtime.  . ferrous sulfate 325 (65 FE) MG tablet TAKE 1 TABLET TWICE A DAY  . furosemide (LASIX) 20 MG tablet Take 20 mg by mouth See admin instructions. Taking 1 tablet for 2 days, then skips for 1-2 days and repeats  . metoprolol succinate (TOPROL-XL) 50 MG 24 hr tablet TAKE 1 TABLET DAILY WITH OR IMMEDIATELY FOLLOWING A MEAL  . Rivaroxaban (XARELTO) 20 MG TABS tablet Take 1 tablet (20 mg total) by mouth daily.  Marland Kitchen spironolactone (ALDACTONE) 25 MG tablet TAKE 1 TABLET EVERY DAY  . verapamil (CALAN-SR)  240 MG CR tablet TAKE 1 TABLET BY MOUTH AT BEDTIME  :  Review of Systems:  Out of a complete 14 point review of systems, all are reviewed and negative with the exception of these symptoms as listed below:   Review of Systems  Constitutional: Positive for appetite change.  Musculoskeletal: Positive for arthralgias, back pain and gait problem.  Psychiatric/Behavioral: Positive for sleep disturbance (daytime sleepiness).    Objective:  Neurologic Exam  Physical Exam Physical Examination:   Filed Vitals:   10/07/13 1542  BP: 129/79  Pulse: 76  Temp: 98.1 F (36.7 C)    General Examination: The patient is a very pleasant 68 y.o. female in no acute distress. She appears well-developed and well-nourished and well groomed.   HEENT: Normocephalic, atraumatic, pupils are equal, round and reactive to light and accommodation. Funduscopic exam is normal with sharp disc margins noted. Extraocular tracking is good without limitation to gaze excursion or nystagmus noted. Normal smooth pursuit is noted. Hearing is grossly intact. Tympanic membranes are clear bilaterally. Face is symmetric with normal facial  animation and normal facial sensation. Speech is clear with no dysarthria noted. There is no hypophonia. There is no lip, neck/head, jaw or voice tremor. Neck is supple with full range of passive and active motion. There are no carotid bruits on auscultation. Oropharynx exam reveals: mild mouth dryness, adequate dental hygiene and moderate airway crowding, due to larger tongue and redundant soft palate. Mallampati is class II. Tongue protrudes centrally and palate elevates symmetrically.  Chest: Clear to auscultation without wheezing, rhonchi or crackles noted.  Heart: S1+S2+0, regular and normal without murmurs, rubs or gallops noted.   Abdomen: Soft, non-tender and non-distended with normal bowel sounds appreciated on auscultation.  Extremities: There is trace pitting edema in the distal lower extremities bilaterally. Pedal pulses are intact.  Skin: Warm and dry without trophic changes noted. There are no varicose veins, but she has significant discoloration to her legs from prior phlebitis.  Musculoskeletal: exam reveals no obvious joint deformities, tenderness or joint swelling or erythema.   Neurologically:  Mental status: The patient is awake, alert and oriented in all 4 spheres. Her memory, attention, language and knowledge are appropriate. There is no aphasia, agnosia, apraxia or anomia. Speech is clear with normal prosody and enunciation. Thought process is linear. Mood is congruent and affect is normal.  Cranial nerves are as described above under HEENT exam. In addition, shoulder shrug is normal with equal shoulder height noted. Motor exam: Normal bulk, strength and tone is noted. There is no drift, tremor or rebound, no atrophy or fasciculations. Romberg is negative. Reflexes are 1+ in the UEs and diminished in the LEs. Toes are downgoing bilaterally. Fine motor skills are intact with normal finger taps, normal hand movements, normal rapid alternating patting, normal foot taps and normal  foot agility.  Cerebellar testing shows no dysmetria or intention tremor on finger to nose testing. There is no truncal or gait ataxia.  Sensory exam is intact to light touch in the upper and lower extremities.  Gait, station and balance: She has trouble standing and walks with a limp, almost a waddle. Posture is moderately stooped. No veering to one side is noted. No leaning to one side is noted. Posture is age-appropriate and stance is wide based. No problems turning are noted. She turns en bloc. Tandem walk is not possible for her.               Assessment and plan:  In summary, [name] is a very pleasant 68 year old female with a history of recurrent bilateral arm numbness and weakness, associated with nausea and followed by HAs. DDx includes migraines, cervical radiculopathy, less likely TIA, Sz. Her exam is stable. She had improvement with Elavil, which she is tolerating well at this time. W/u is in keeping with degenerative spine disease and CTS and recurrent HAs. She had CK elevation, and we will re-check it today. She has had extensive w/u which I reviewed with her today at length. Symptomatically, for her headaches I would like to continue her on Elavil. She is again advised about potential side effects today. I would like to see her back in 6 months from now, sooner if the need arises and advised her to call with any interim questions, concerns, problems or updates and refill requests and test results. We will call her with the test results. She is advised to discuss an alternative to Lipitor with her primary care physician.

## 2013-10-11 MED ORDER — RIVAROXABAN 20 MG PO TABS: 20.0000 mg | ORAL_TABLET | Freq: Every day | ORAL | Status: DC

## 2013-10-11 NOTE — Telephone Encounter (Signed)
Patient calling to check on the status of xarelto PA.  Pls call home # [phone]

## 2013-10-11 NOTE — Telephone Encounter (Signed)
Dr. Joya Gaskins has been out of the office. The xarelto PA was signed today, and I have faxed it back. Was advised we would need to call [phone] opt 2 to advise of this.  I had to leave a msg on this # advising of this.

## 2013-10-11 NOTE — Telephone Encounter (Signed)
Called spoke with patient and informed her that PW had been out of the office, but Crystal was able to get form signed and PA faxed back earlier today. Pt took her last pill of the samples provided on 2.27.15 yesterday, so she does not have any for tonight -- it is now too late for pt to come to the office to pick up samples today. Samples will be given to patient at a convenient location after work > 2 samples, documented per protocol  Will forward message back to [place] for follow up on the Xarelto PA

## 2013-10-11 NOTE — Telephone Encounter (Signed)
Pt's insurance, [employer] is requesting an update on the PA for Xarelto.  Can be reached at [phone] or [phone].

## 2013-10-15 ENCOUNTER — Telehealth: Payer: Self-pay

## 2013-10-15 ENCOUNTER — Telehealth: Payer: Self-pay | Admitting: Neurology

## 2013-10-15 NOTE — Telephone Encounter (Signed)
Patient calling to state that her insurance will not cover her Amitriptyline medication, but patient still wants to take it and does not mind paying for it herself since she states she has been feeling better ever since she started taking it. Patient just wanted to make [place] aware. If questions, please call patient.

## 2013-10-15 NOTE — Telephone Encounter (Signed)
Patient has changed ins.  [employer] sent [place] a letter saying they have denied our request for coverage on Amitriptyline.  I called the pharmacy and spoke with pharmacist.  He said without ins this med will cost $12 per month, and they have the Rx ready for pick up at this cost.  I called the patient to see if she would like to pay for meds.  Got no answer.  Left message.

## 2013-10-15 NOTE — Telephone Encounter (Signed)
Called [facility], spoke with Latyria to check on the status of the xarelto PA. Was advised it is APPROVED from 10/11/13 - 10/11/14. Case # [id number] [facility] with [facility] aware of approval and medication has went through on pt's insurance. Pt aware of approval.  She voiced no further questions or concerns at this time.

## 2013-11-16 ENCOUNTER — Ambulatory Visit: Payer: Medicare Other

## 2013-12-18 ENCOUNTER — Other Ambulatory Visit: Payer: Self-pay | Admitting: Family Medicine

## 2013-12-21 ENCOUNTER — Telehealth: Payer: Self-pay | Admitting: Family Medicine

## 2013-12-21 NOTE — Telephone Encounter (Signed)
To [facility] red team - please call pt and let her know I have refilled one month's worth of her blood pressure medicines but she needs to schedule an appointment to follow up on her chronic medical problems. Thanks!  Leeanne Rio, MD

## 2013-12-22 NOTE — Telephone Encounter (Signed)
Called and informed patient of med refill for one month and that she must schedule an office visit with her PCP for any further refills.[name]

## 2014-01-10 ENCOUNTER — Telehealth: Payer: Self-pay | Admitting: *Deleted

## 2014-01-10 ENCOUNTER — Ambulatory Visit (INDEPENDENT_AMBULATORY_CARE_PROVIDER_SITE_OTHER): Payer: Medicare Other | Admitting: Family Medicine

## 2014-01-10 ENCOUNTER — Encounter: Payer: Self-pay | Admitting: Family Medicine

## 2014-01-10 ENCOUNTER — Ambulatory Visit (HOSPITAL_COMMUNITY)
Admission: RE | Admit: 2014-01-10 | Discharge: 2014-01-10 | Disposition: A | Discharge status: Home / Self Care | Payer: Medicare Other | Source: Ambulatory Visit | Attending: Family Medicine | Admitting: Family Medicine

## 2014-01-10 VITALS — BP 120/77 | HR 87 | Ht 62.5 in | Wt 272.0 lb

## 2014-01-10 DIAGNOSIS — R0602 Shortness of breath: Secondary | ICD-10-CM | POA: Diagnosis present

## 2014-01-10 DIAGNOSIS — K219 Gastro-esophageal reflux disease without esophagitis: Secondary | ICD-10-CM

## 2014-01-10 DIAGNOSIS — I10 Essential (primary) hypertension: Secondary | ICD-10-CM

## 2014-01-10 DIAGNOSIS — R0989 Other specified symptoms and signs involving the circulatory and respiratory systems: Secondary | ICD-10-CM

## 2014-01-10 DIAGNOSIS — R0609 Other forms of dyspnea: Secondary | ICD-10-CM | POA: Insufficient documentation

## 2014-01-10 LAB — COMPREHENSIVE METABOLIC PANEL
ALT: <8 U/L (ref 0–35)
AST: 13 U/L (ref 0–37)
Albumin: 4.1 g/dL (ref 3.5–5.2)
Alkaline Phosphatase: 85 U/L (ref 39–117)
BUN: 16 mg/dL (ref 6–23)
CO2: 29 mEq/L (ref 19–32)
Calcium: 9.8 mg/dL (ref 8.4–10.5)
Chloride: 99 mEq/L (ref 96–112)
Creat: 1.38 mg/dL — ABNORMAL HIGH (ref 0.50–1.10)
Glucose, Bld: 93 mg/dL (ref 70–99)
Potassium: 4.3 mEq/L (ref 3.5–5.3)
Sodium: 139 mEq/L (ref 135–145)
Total Bilirubin: 0.4 mg/dL (ref 0.2–1.2)
Total Protein: 7.3 g/dL (ref 6.0–8.3)

## 2014-01-10 LAB — CBC
HCT: 38.7 % (ref 36.0–46.0)
Hemoglobin: 13 g/dL (ref 12.0–15.0)
MCH: 28.3 pg (ref 26.0–34.0)
MCHC: 33.6 g/dL (ref 30.0–36.0)
MCV: 84.1 fL (ref 78.0–100.0)
Platelets: 384 10*3/uL (ref 150–400)
RBC: 4.6 MIL/uL (ref 3.87–5.11)
RDW: 15 % (ref 11.5–15.5)
WBC: 9.8 10*3/uL (ref 4.0–10.5)

## 2014-01-10 LAB — CK: Total CK: 104 U/L (ref 7–177)

## 2014-01-10 MED ORDER — OMEPRAZOLE 20 MG PO CPDR: 20.0000 mg | DELAYED_RELEASE_CAPSULE | Freq: Every day | ORAL | Status: DC

## 2014-01-10 NOTE — Patient Instructions (Signed)
It was great to see you again today!  For blood pressure: your BP is great today.  For acid reflux: we are starting a medicine (omeprazole). Take it once daily.  For shortness of breath: we are checking bloodwork, an EKG, and an echo.   Follow up with me in 2 weeks to review your results and see how your breathing is doing.   Schedule a separate visit to injection your knees.  If you have any chest pain that does not go away within 30 minutes, is accompanied by nausea, sweating, shortness of breath, or made worse by activity, go to the emergency room immediately for evaluation.  Be well, Dr. Ardelia Mems

## 2014-01-10 NOTE — Telephone Encounter (Signed)
Called and informed patient of appointment for 2D Echo at [facility] on 01/14/14 at [place]

## 2014-01-10 NOTE — Progress Notes (Signed)
Patient ID: [name], female   DOB: 1946-01-07, 68 y.o.   MRN: [id number]  HPI:  HTN - no issues with her medicines. Takes medications daily. BP at goal today.  GERD - thinks has acid reflux. Has gone on for about 3 weeks. After every meal feels burning chest pain. No change with different types of food. Yogurt makes it better. Has taken rolaids, which help. Would like medicine to take for this. No hemoptysis. No weight loss. Feels the same as when she has had reflux in the past. Used to take a medicine for this.  SOB - hx of PE on xarelto, has IVC filter. SOB began one month ago. Occurs with any activity/exertion. No orthopnea. Dry cough. Takes lasix qod. No new or different chest pain other than the reflux-like pain mentioned above. Had a stress test "years ago" which was reportedly normal. Last echo was in summer 2014. Has family hx of heart disease (dad died at age 24 of an MI).   CK elevation - noted on prior labs. Pt states she took lipitor for years and it gave her heartburn. Not currently on any statins.  ROS: See HPI. No other chest pain, fever, n/v/d, rash, weight loss.  [place]: hx recurrent PE, arm numbness which has responded well to amitryptiline (rx'd by neurology), HTN, CKD stage 3, CK elevation, bilat knee osteoarthritis  PHYSICAL EXAM: BP 120/77  Pulse 87  Ht 5' 2.5" (1.588 m)  Wt 272 lb (123.378 kg)  BMI 48.93 kg/m2 Gen: NAD HEENT: NCAT Heart: RRR, no murmurs Lungs: CTAB, NWOB Neuro: grossly nonfocal, speech normal Ext: No appreciable lower extremity edema bilaterally   ASSESSMENT/PLAN:  See problem based charting for additional assessment/plan.  FOLLOW UP: F/u in 2 weeks for dyspnea Separate visit for bilateral knee injections (pt wanted these done today but we were out of time).  [place]. Ardelia Mems, [place]

## 2014-01-12 ENCOUNTER — Ambulatory Visit: Payer: Medicare Other | Admitting: Family Medicine

## 2014-01-13 ENCOUNTER — Encounter: Payer: Self-pay | Admitting: Family Medicine

## 2014-01-13 DIAGNOSIS — K219 Gastro-esophageal reflux disease without esophagitis: Secondary | ICD-10-CM | POA: Insufficient documentation

## 2014-01-13 NOTE — Assessment & Plan Note (Addendum)
BP well controlled. Continue current regimen. Check electrolytes and renal function today.

## 2014-01-13 NOTE — Assessment & Plan Note (Signed)
Sx's of heartburn consistent with GERD. Will rx PPI. F/u in 2 weeks to evaluate for improvement.

## 2014-01-13 NOTE — Assessment & Plan Note (Addendum)
Etiology unclear by history alone. Pulmonary exam normal today. Possibly related to poorly controlled GERD (starting PPI today). Pt has hx of recurrent PE but is on anticoagulation with xarelto and has IVC filter in place. EKG today is unchanged from prior and shows no signs of acute ischemia. Will start workup with repeating echo, also check CBC, CMET, and CK today (CK due to hx of CK elevation). F/u in 2 weeks to reassess. Reviewed return precautions with pt, and reasons to prompt ER visit. Precepted with Dr. Nori Riis who agrees with this plan.

## 2014-01-14 ENCOUNTER — Encounter (HOSPITAL_COMMUNITY)
Admission: RE | Admit: 2014-01-14 | Discharge: 2014-01-14 | Disposition: A | Discharge status: Home / Self Care | Payer: Medicare Other | Source: Ambulatory Visit | Attending: Family Medicine | Admitting: Family Medicine

## 2014-01-14 DIAGNOSIS — I519 Heart disease, unspecified: Secondary | ICD-10-CM

## 2014-01-14 DIAGNOSIS — R0602 Shortness of breath: Secondary | ICD-10-CM | POA: Insufficient documentation

## 2014-01-14 NOTE — Progress Notes (Signed)
  Echocardiogram 2D Echocardiogram has been performed.  [name] 01/14/2014, 4:21 PM

## 2014-01-17 ENCOUNTER — Ambulatory Visit (INDEPENDENT_AMBULATORY_CARE_PROVIDER_SITE_OTHER): Payer: Medicare Other | Admitting: Family Medicine

## 2014-01-17 ENCOUNTER — Encounter: Payer: Self-pay | Admitting: Family Medicine

## 2014-01-17 VITALS — BP 142/76 | HR 93 | Temp 98.8°F | Wt 278.0 lb

## 2014-01-17 DIAGNOSIS — I10 Essential (primary) hypertension: Secondary | ICD-10-CM

## 2014-01-17 DIAGNOSIS — M25562 Pain in left knee: Principal | ICD-10-CM

## 2014-01-17 DIAGNOSIS — M1710 Unilateral primary osteoarthritis, unspecified knee: Secondary | ICD-10-CM

## 2014-01-17 DIAGNOSIS — M170 Bilateral primary osteoarthritis of knee: Secondary | ICD-10-CM

## 2014-01-17 DIAGNOSIS — M25561 Pain in right knee: Secondary | ICD-10-CM

## 2014-01-17 DIAGNOSIS — IMO0002 Reserved for concepts with insufficient information to code with codable children: Secondary | ICD-10-CM

## 2014-01-17 DIAGNOSIS — M25569 Pain in unspecified knee: Secondary | ICD-10-CM

## 2014-01-17 NOTE — Patient Instructions (Addendum)
Bilateral knee pain/arthritis - knee injections given today, may return in 3 months for another course of steroid injections, please go to the Hospital and get xrays completed of your knees (please make sure that you are standing when they take the xrays)  Elevated blood pressure - continue your current medications, please follow up with Dr. Ardelia Mems as scheduled.

## 2014-01-18 NOTE — Assessment & Plan Note (Signed)
Elevated initially, Improved during visit. -No changes to pharmacotherapy -monitor blood pressure at subsequent visits

## 2014-01-18 NOTE — Progress Notes (Signed)
   Subjective:    Patient ID: [name], female    DOB: 1945-10-31, 68 y.o.   MRN: [id number]  HPI 68 y/o female presents for evaluation of bilateral knee pain.  Knee Pain - bilateral, has been present for many years, has previously had two hip replacements (right in 2004 and left in 2010), has previously been followed by and Orthopedic surgeon Dr. Mayer Camel, has had good relief of her symptoms previously with steroid injections (which she requests today), has also had viscosupplementation in the past which provided pain relief, pain is anterior, no locking or clicking, does not ice regularly, does not require a walking assist device, patient previously on coumadin (now on Xarelto) and tolerated injections well  Hypertension - currently on Metoprolol, Verapamil, and Spironolactone, taking medications as prescribed, no side effects, no chest pain, no vision changes, no headaches   Review of Systems  Constitutional: Negative for fever and fatigue.  Respiratory: Negative for chest tightness and shortness of breath.   Cardiovascular: Negative for chest pain.  Musculoskeletal: Positive for arthralgias.       Objective:   Physical Exam Vitals: reviewed Gen: pleasant female, NAD MSK:  Right knee - no swelling, medial joint line tenderness, ligament testing including lachman/varus stress test/valgus stress test intact, McMurry negative for pain/clicking, no crepitus with flexion/extension Left knee - no swelling, medial joint line tenderness, ligament testing including lachman/varus stress test/valgus stress test intact, McMurry negative for pain/clicking, no crepitus with flexion/extension Neuro: strength 5/5 bilaterally to knee flexion/extension  Knee Injection: Right  Written and verbal consent was obtained after discussing the risks and benefits of the procedure with the patient. The anterior knee was cleansed in a sterile fashion with betadine. 40 mg Depo-medrol and 3 cc 1% Lidocaine was  injected using an anterolateral approach using a 5 cc syringe and 25 gauge 11/2 in needle. No complications were encountered. Minimal blood loss. A band aid was applied.   Knee Injection: Left Written and verbal consent was obtained after discussing the risks and benefits of the procedure with the patient. The anterior knee was cleansed in a sterile fashion with betadine. 40 mg Depo-medrol and 3 cc 1% Lidocaine was injected using an anterolateral approach using a 5 cc syringe and 25 gauge 11/2 in needle. No complications were encountered. Minimal blood loss. A band aid was applied.       Assessment & Plan:  Please see problem specific assessment and plan.

## 2014-01-18 NOTE — Assessment & Plan Note (Signed)
Patient has pain in bilateral knee. Ligament testing intact. -check xray of bilateral knees to evaluate amount of OA -bilateral knee steroid injections provided

## 2014-01-24 ENCOUNTER — Ambulatory Visit (INDEPENDENT_AMBULATORY_CARE_PROVIDER_SITE_OTHER): Payer: Medicare Other | Admitting: Family Medicine

## 2014-01-24 DIAGNOSIS — M170 Bilateral primary osteoarthritis of knee: Secondary | ICD-10-CM

## 2014-01-24 DIAGNOSIS — R0989 Other specified symptoms and signs involving the circulatory and respiratory systems: Secondary | ICD-10-CM

## 2014-01-24 DIAGNOSIS — R0609 Other forms of dyspnea: Secondary | ICD-10-CM

## 2014-01-24 DIAGNOSIS — IMO0002 Reserved for concepts with insufficient information to code with codable children: Secondary | ICD-10-CM

## 2014-01-24 DIAGNOSIS — M1710 Unilateral primary osteoarthritis, unspecified knee: Secondary | ICD-10-CM

## 2014-01-24 NOTE — Progress Notes (Signed)
   Subjective:    Patient ID: [name], female    DOB: 1945/10/19, 68 y.o.   MRN: [id number]  HPI  Follow up  DJD of knees Status post injections last week.  Feels improved.  No redness or fever or soft tissue swelling  Dyspnea Has resolved.  No chest pain or shortness of breath now.    PMH Reviewed her echo and recent labs - all normal or consistent with prior labs - mildly elevated crt  Chief Complaint noted Review of Symptoms - see HPI PMH - Smoking status noted.      Review of Systems     Objective:   Physical Exam   Alert no acute distress      Assessment & Plan:

## 2014-01-24 NOTE — Assessment & Plan Note (Signed)
Improved

## 2014-01-24 NOTE — Assessment & Plan Note (Signed)
Resolved

## 2014-01-24 NOTE — Patient Instructions (Signed)
Good to see you today!  Thanks for coming in.  Come back to see Dr Ardelia Mems your PCP in July  Your blood test and echo was all normal  Call [facility] and see when your last colonoscopy was and when you need another

## 2014-02-03 ENCOUNTER — Telehealth: Payer: Self-pay | Admitting: Family Medicine

## 2014-02-03 NOTE — Telephone Encounter (Signed)
[name] with [employer] will be sending a fax to dr for a back brace   pt case # [id number]

## 2014-02-14 ENCOUNTER — Ambulatory Visit: Payer: Medicare Other | Admitting: Family Medicine

## 2014-02-17 ENCOUNTER — Ambulatory Visit (INDEPENDENT_AMBULATORY_CARE_PROVIDER_SITE_OTHER): Payer: Commercial Managed Care - HMO | Admitting: Family Medicine

## 2014-02-17 VITALS — BP 131/87 | HR 89 | Temp 98.1°F | Wt 273.7 lb

## 2014-02-17 DIAGNOSIS — M170 Bilateral primary osteoarthritis of knee: Secondary | ICD-10-CM

## 2014-02-17 DIAGNOSIS — IMO0002 Reserved for concepts with insufficient information to code with codable children: Secondary | ICD-10-CM

## 2014-02-17 DIAGNOSIS — M1710 Unilateral primary osteoarthritis, unspecified knee: Secondary | ICD-10-CM

## 2014-02-17 DIAGNOSIS — K219 Gastro-esophageal reflux disease without esophagitis: Secondary | ICD-10-CM

## 2014-02-17 MED ORDER — OMEPRAZOLE 20 MG PO CPDR: 20.0000 mg | DELAYED_RELEASE_CAPSULE | Freq: Every day | ORAL | Status: DC

## 2014-02-17 NOTE — Patient Instructions (Signed)
It was great to see you again today!  For knee pain - try tylenol and ice. Get the xrays done. Follow up with me in a few weeks after the xrays  For acid reflux - glad you are doing better. I refilled your medicine.  For foster care paperwork - I'll send in the paperwork.  Follow up in a few weeks for your knees, and 3 months for chronic medical problems.  Be well, Dr. Ardelia Mems

## 2014-02-21 NOTE — Assessment & Plan Note (Signed)
Improved with omeprazole. Refilled today.

## 2014-02-21 NOTE — Progress Notes (Signed)
Patient ID: [name], female   DOB: Apr 17, 1946, 68 y.o.   MRN: [id number]  HPI:  GERD - previously was having acid reflux type pain. Started omeprazole at our last visit. She states her pain is much better now, well controlled.  Bilat knee pain - had injections to her knees recently by Dr. Ree Kida. Knees are still bothering her. Does not try icing her knees. Has not taken tylenol recently for the pain. Has xrays ordered but hasn't gotten them done yet.  Paperwork for foster care - she is a foster parent. Has a form she needs filled out so that she can continue being a parent.   ROS: See HPI. Recent hx of SOB now improved/resolved.  [place]: hx HLD, obesity, anemia, HTN, PE, CKD III, OA L hip, chronic knee pain bilat, GERD  PHYSICAL EXAM: BP 131/87  Pulse 89  Temp(Src) 98.1 F (36.7 C) (Oral)  Wt 273 lb 11.2 oz (124.15 kg) Gen: NAD HEENT: NCAT Heart: RRR Lungs: CTAB, NWOB Neuro: grossly nonfocal, speech normal Ext: obesity limits exams of knees. No effusions or warmth.  ASSESSMENT/PLAN:  See problem based charting for additional assessment/plan.  FOLLOW UP: F/u in a few weeks for knees F/u in 3 months for chronic medical problems.  [place]. Ardelia Mems, [place]

## 2014-02-21 NOTE — Assessment & Plan Note (Signed)
Still c/o chronic bilat knee pain. Has not yet gotten xrays. Plan: Ice knees Tylenol prn pain Get xrays F/u in a few weeks

## 2014-02-22 ENCOUNTER — Other Ambulatory Visit: Payer: Self-pay | Admitting: Family Medicine

## 2014-02-23 ENCOUNTER — Other Ambulatory Visit: Payer: Self-pay | Admitting: Family Medicine

## 2014-02-25 ENCOUNTER — Ambulatory Visit (HOSPITAL_COMMUNITY)
Admission: RE | Admit: 2014-02-25 | Discharge: 2014-02-25 | Disposition: A | Discharge status: Home / Self Care | Payer: Medicare HMO | Source: Ambulatory Visit | Attending: Family Medicine | Admitting: Family Medicine

## 2014-02-25 DIAGNOSIS — M25569 Pain in unspecified knee: Secondary | ICD-10-CM | POA: Diagnosis present

## 2014-02-25 DIAGNOSIS — M25562 Pain in left knee: Secondary | ICD-10-CM

## 2014-02-25 DIAGNOSIS — M25561 Pain in right knee: Secondary | ICD-10-CM

## 2014-03-01 ENCOUNTER — Encounter: Payer: Self-pay | Admitting: Family Medicine

## 2014-03-26 ENCOUNTER — Other Ambulatory Visit: Payer: Self-pay | Admitting: Critical Care Medicine

## 2014-04-12 ENCOUNTER — Ambulatory Visit (INDEPENDENT_AMBULATORY_CARE_PROVIDER_SITE_OTHER): Payer: Commercial Managed Care - HMO | Admitting: Neurology

## 2014-04-12 ENCOUNTER — Encounter: Payer: Self-pay | Admitting: Neurology

## 2014-04-12 VITALS — BP 184/82 | HR 85 | Temp 97.9°F | Ht 62.0 in | Wt 274.0 lb

## 2014-04-12 DIAGNOSIS — Z79899 Other long term (current) drug therapy: Secondary | ICD-10-CM

## 2014-04-12 DIAGNOSIS — Z5181 Encounter for therapeutic drug level monitoring: Secondary | ICD-10-CM

## 2014-04-12 DIAGNOSIS — M47812 Spondylosis without myelopathy or radiculopathy, cervical region: Secondary | ICD-10-CM

## 2014-04-12 DIAGNOSIS — R51 Headache: Secondary | ICD-10-CM

## 2014-04-12 DIAGNOSIS — R519 Headache, unspecified: Secondary | ICD-10-CM

## 2014-04-12 DIAGNOSIS — Z86711 Personal history of pulmonary embolism: Secondary | ICD-10-CM

## 2014-04-12 MED ORDER — AMITRIPTYLINE HCL 25 MG PO TABS: 50.0000 mg | ORAL_TABLET | Freq: Every day | ORAL | Status: DC

## 2014-04-12 NOTE — Progress Notes (Signed)
Subjective:    Sharp ID: [name] is a 68 y.o. Sharp.  HPI    Interim history:   [name] is a very pleasant 68 year old right-handed woman with an underlying complex medical history of chronic kidney disease, recurrent thrombophlebitis and recurrent PE (previously on coumadin, now on Xarelto), hyperlipidemia, anemia, who presents for followup consultation of [name] episodic arm numbness and weakness. [name] is unaccompanied today. I last saw [name] on 10/07/2013, at which time we talked about all [name] test results [name] had blood work through [name] primary care provider on 01/10/2014 which I reviewed: [name] had stable findings as outlined below, and [name] reported that amitriptyline had been helpful. [name] had had an elevation in CK level in Sharon past but recheck recently was normal. [name] saw NSG and there was no intervention needed. 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 [name] ran out of elavil and had recurrence of HAs. [name] had noted no SEs. [name] was taking 50 mg each night. [name] stopped Sharon Lipitor d/t GER and [name] HCTZ was stopped. In Sharon interim, [name] called on 10/15/2013 reporting that [name] insurance was not covering [name] amitriptyline but [name] wanted to continue using it and pay for it out of pocket as I understand.  Today, [name] reports doing well [name] had blood work through [name] PCP on 01/10/2014 which I reviewed: [name] had normal CBC, stable CMP. [name] has not had an episodic numbness recently. [name] current insurance has covered [name] Rx for amitriptyline. [name] had X rays of both knees on 02/25/14, which showed arthritic changes. [name] has had Sharon gel injections and steroid injections in Sharon past.   I first met [name] on 02/26/2013, at which time [name] presented with a two-month history of intermittent episodes of weakness affecting [name] arms as well as numbness associated. This lasted for about 3-5 minutes at a time. [name] has also had recurrent headaches with associated nausea,  but no vomiting and has had a throbbing, bifrontal HA, and Sharon HA last about 3 hours. [name] had two episodes while driving. At Sharon time of [name] first visit with me I felt Sharon differential diagnoses included migraines with neurological manifestation, cervical radiculopathy and [name] exam was nonfocal. I suggested blood work as well as an EEG, EMG and nerve conduction studies as well as a cervical spine MRI. Symptomatically for [name] recurrent headaches I tried [name] on low dose amitriptyline.  [name] EEG from 03/04/13 showed: This is a normal EEG recording in Sharon waking and sleeping state. No evidence of ictal or interictal discharges were seen at any time during Sharon recording.  [name] EMG and nerve conduction studies from 03/10/2013 showed: Nerve conduction studies done on both upper extremities shows evidence of bilateral carpal tunnel syndrome of moderate severity on Sharon right, mild severity on Sharon left. EMG evaluations of both upper extremities show similar findings consistent with bilateral chronic stable C8 radiculopathies. No other significant abnormalities were seen.  MRI cervical spine without contrast on 03/08/2013 showed: 1. At C6-7: moderate spinal stenosis and severe bilateral foraminal stenosis; no cord signal abnormalities, 2. At C5-6, C7-T1: mild-moderate spinal stenosis and severe bilateral foraminal stenosis; no cord signal abnormalities 3. At C3-4, C4-5: mild spinal stenosis and severe bilateral foraminal stenosis; no cord signal abnormalities 4. Multi-level degenerative spondylosis and disc bulging from C3-4 down to C6-7. Reversal of normal cervical curvature at C4 and C5.  Lab results from 02/26/2013 showed normal SPEP  and immunofluorescent electrophoresis, normal rheumatoid factor, normal vitamin D level, normal ANA, normal RPR, normal ESR, normal aldolase, normal CRP, normal CK-MB but elevated CK of 438. I suggested we recheck it down Sharon [place]. Based on [name] imaging and EMG test results I referred [name] to  neurosurgery.   [name] has had an outside TIA workup which included MRI and MRA brain, echocardiogram and carotid Doppler studies which were unremarkable with less than 39% stenosis in b/l ICAs and [name] MRI/MRA brain from 01/27/13: Normal MRI appearance of Sharon brain. Left sphenoid sinusitis with opacification of greater than 50%. Mild distal small vessel disease. No significant proximal stenosis, aneurysm, or branch vessel occlusion.   [name] Past Medical History Is Significant For: Past Medical History  Diagnosis Date  . Hyperlipidemia   . 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 Recurrent pulmonary embolism     see pulmonologist Dr. Asencion Noble  . Obesity (BMI 30-39.9)   . DJD (degenerative joint disease) of hip   . Sprain and strain of wrist   . CKD (chronic kidney disease), stage III     pt doesn't know why this is listed  . GERD (gastroesophageal reflux disease)     h/o in Sharon 1990s  . Heart murmur   . Hypertension     sees Dr. Gilda Crease  . Anemia     [name] Past Surgical History Is Significant For: Past Surgical History  Procedure Laterality Date  . Partial hip arthroplasty  Left, 02/2009    Dr Hal Morales  . Shoulder surgery  Left 07/15/08    Dr Hal Morales  . Femoral vein ligation  12/2002  . [place] filter  11/03/2003    R Jugular  . Total hip arthroplasty  R 05/2203, L 2010  . Tubal ligation  1981  . Transthoracic echocardiogram      EF 55-60%, RV mod dilated, mildly increased LV wall thickness  . Dilation and curettage of uterus  02/21/2012    Procedure: DILATATION AND CURETTAGE;  Surgeon: Emily Filbert, MD;  Location: [facility];  Service: Gynecology;  Laterality: N/A;  . Carpal tunnel release  09/02/2012    Procedure: CARPAL TUNNEL RELEASE;  Surgeon: Winfield Cunas, MD;  Location: [facility];  Service: Neurosurgery;  Laterality: Right;  RIGHT  carpal tunnel release    [name] Family History Is Significant For: Family History  Problem Relation Age of Onset  . Heart disease Mother   . Heart disease Father   . Cancer Brother   . Cancer Sister     [name] Social History Is Significant For: History   Social History  . Marital Status: Widowed    Spouse Name: N/A    Number of Children: 2  . Years of Education: College   Occupational History  .     Social History Main Topics  . Smoking status: Never Smoker   . Smokeless tobacco: Never Used  . Alcohol Use: No  . Drug Use: No  . Sexual Activity: Not Currently    Birth Control/ Protection: None   Other Topics Concern  . 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    Lives at home with  daughter born in '87.    Non smoker. Non alcohol drinker.;    Fosters children .  [name] Allergies Are:  No Known Allergies:   [name] Current Medications Are:  Outpatient Encounter Prescriptions as of 04/12/2014  Medication Sig  . amitriptyline (ELAVIL) 25 MG tablet Take 2 tablets (50 mg total) by mouth at bedtime.  . ferrous sulfate 325 (65 FE) MG tablet TAKE 1 TABLET TWICE A DAY  . furosemide (LASIX) 20 MG tablet Take 20 mg by mouth See admin instructions. Taking 1 tablet for 2 days, then skips for 1-2 days and repeats  . metoprolol succinate (TOPROL-XL) 50 MG 24 hr tablet TAKE 1 TABLET DAILY WITH OR IMMEDIATELY FOLLOWING A MEAL  . omeprazole (PRILOSEC) 20 MG capsule Take 1 capsule (20 mg total) by mouth daily.  . Rivaroxaban (XARELTO) 20 MG TABS tablet Take 1 tablet (20 mg total) by mouth daily with supper.  Marland Kitchen spironolactone (ALDACTONE) 25 MG tablet TAKE 1 TABLET EVERY DAY  . verapamil (CALAN-SR) 240 MG CR tablet TAKE 1 TABLET BY MOUTH AT BEDTIME  . [DISCONTINUED] Rivaroxaban (XARELTO) 20 MG TABS tablet Take 1 tablet (20 mg total) by mouth daily.  . [DISCONTINUED] XARELTO 20 MG TABS tablet TAKE 1  TABLET (20 MG TOTAL) BY MOUTH DAILY.  :  Review of Systems:  Out of a complete 14 point review of systems, all are reviewed and negative with Sharon exception of these symptoms as listed below:   Review of Systems  All other systems reviewed and are negative.   Objective:  Neurologic Exam  Physical Exam Physical Examination:   Filed Vitals:   04/12/14 1511  BP: 184/82  Pulse: 85  Temp: 97.9 F (36.6 C)    General Examination: [name] is a very pleasant 68 y.o. Sharp in no acute distress. [name] appears well-developed and well-nourished and well groomed. [name] is in good spirits today.  HEENT: Normocephalic, atraumatic, pupils are equal, round and reactive to light and accommodation. Funduscopic exam is normal with sharp disc margins noted. Extraocular tracking is good without limitation to gaze excursion or nystagmus noted. Normal smooth pursuit is noted. Hearing is grossly intact. Face is symmetric with normal facial animation and normal facial sensation. Speech is clear with no dysarthria noted. There is no hypophonia. There is no lip, neck/head, jaw or voice tremor. Neck is supple with full range of passive and active motion. There are no carotid bruits on auscultation. Oropharynx exam reveals: mild mouth dryness, adequate dental hygiene and moderate airway crowding, due to larger tongue and redundant soft palate. Mallampati is class II. Tongue protrudes centrally and palate elevates symmetrically.  Chest: Clear to auscultation without wheezing, rhonchi or crackles noted.  Heart: S1+S2+0, regular and normal without murmurs, rubs or gallops noted.   Abdomen: Soft, non-tender and non-distended with normal bowel sounds appreciated on auscultation.  Extremities: There is 1 to 2+ pitting edema in Sharon distal lower extremities bilaterally. Pedal pulses are intact.  Skin: Warm and dry without trophic changes noted. There are no varicose veins, but [name] has significant discoloration to [name]  legs from prior phlebitis, unchanged.  Musculoskeletal: exam reveals no obvious joint deformities, tenderness or joint swelling or erythema.   Neurologically:  Mental status: [name] is awake, alert and oriented in all 4 spheres. [name] memory, attention, language and knowledge are appropriate. There is no aphasia, agnosia, apraxia or anomia. Speech is clear with normal prosody and enunciation. Thought process is linear. Mood is congruent and affect is normal.  Cranial nerves are as described above under HEENT exam. In addition, shoulder shrug is normal with equal shoulder  height noted. Motor exam: Normal bulk, strength and tone is noted. There is no drift, tremor or rebound, no atrophy or fasciculations. Romberg is negative. Reflexes are 1+ in Sharon UEs and diminished in Sharon LEs. Toes are downgoing bilaterally. Fine motor skills are intact with normal finger taps, normal hand movements, normal rapid alternating patting, normal foot taps and normal foot agility.  Cerebellar testing shows no dysmetria or intention tremor on finger to nose testing. There is no truncal or gait ataxia.  Sensory exam is intact to light touch in Sharon upper and lower extremities.  Gait, station and balance: [name] has trouble standing and walks with a limp, almost a waddle. Posture is moderately stooped with a significant kink in Sharon lower back. No veering to one side is noted. No leaning to one side is noted. Posture is age-appropriate and stance is wide based. No problems turning are noted. [name] turns en bloc. Tandem walk is not possible for [name].              Assessment and plan:   In summary, [name] is a very pleasant 68 year old Sharp with a history of recurrent bilateral arm numbness and weakness, associated with nausea and followed by HAs. DDx includes migraines, cervical radiculopathy, less likely TIA, Sz. [name] exam is stable with Sharon exception of more LE swelling, but [name] has not taken [name] diuretic in 4 days, as  [name] was on vacation. [name] reports ongoing improvement with Elavil, which [name] is tolerating well at this time. W/u is in keeping with degenerative spine disease and CTS and recurrent HAs. [name] had CK elevation in Sharon past which recently upon recheck was normal. [name] has had an extensive vascular and neurophysiological w/u in Sharon recent past. Symptomatically, for [name] headaches I would like to continue [name] on Elavil. [name] is again advised about potential side effects today and since [name] has not had a recent EKG, I would like to order an outpatient EKG. I will see [name] back in 6 months from now, sooner if Sharon need arises and advised [name] to call with any interim questions, concerns, problems or updates and refill requests and test results. We will call [name] with Sharon EKG result. I renewed [name] Rx for Elavil.

## 2014-04-12 NOTE — Patient Instructions (Signed)
We will continue with Elavil at the current dose.  We will do an EKG for routine reasons as you have been on amitriptyline on a longer term basis.  I can see you back in 6 months.

## 2014-04-20 ENCOUNTER — Other Ambulatory Visit: Payer: Self-pay | Admitting: Neurology

## 2014-05-17 ENCOUNTER — Encounter (HOSPITAL_COMMUNITY): Payer: Self-pay | Admitting: Emergency Medicine

## 2014-05-17 ENCOUNTER — Emergency Department (HOSPITAL_COMMUNITY)
Admission: EM | Admit: 2014-05-17 | Discharge: 2014-05-18 | Disposition: A | Discharge status: Home / Self Care | Payer: Medicare HMO | Attending: Emergency Medicine | Admitting: Emergency Medicine

## 2014-05-17 DIAGNOSIS — D649 Anemia, unspecified: Secondary | ICD-10-CM | POA: Insufficient documentation

## 2014-05-17 DIAGNOSIS — Z86718 Personal history of other venous thrombosis and embolism: Secondary | ICD-10-CM | POA: Insufficient documentation

## 2014-05-17 DIAGNOSIS — R2242 Localized swelling, mass and lump, left lower limb: Secondary | ICD-10-CM | POA: Diagnosis present

## 2014-05-17 DIAGNOSIS — E669 Obesity, unspecified: Secondary | ICD-10-CM | POA: Insufficient documentation

## 2014-05-17 DIAGNOSIS — M7989 Other specified soft tissue disorders: Secondary | ICD-10-CM

## 2014-05-17 DIAGNOSIS — Z7901 Long term (current) use of anticoagulants: Secondary | ICD-10-CM | POA: Insufficient documentation

## 2014-05-17 DIAGNOSIS — N183 Chronic kidney disease, stage 3 (moderate): Secondary | ICD-10-CM | POA: Insufficient documentation

## 2014-05-17 DIAGNOSIS — Z86711 Personal history of pulmonary embolism: Secondary | ICD-10-CM | POA: Insufficient documentation

## 2014-05-17 DIAGNOSIS — I129 Hypertensive chronic kidney disease with stage 1 through stage 4 chronic kidney disease, or unspecified chronic kidney disease: Secondary | ICD-10-CM | POA: Insufficient documentation

## 2014-05-17 DIAGNOSIS — R011 Cardiac murmur, unspecified: Secondary | ICD-10-CM | POA: Diagnosis not present

## 2014-05-17 DIAGNOSIS — K219 Gastro-esophageal reflux disease without esophagitis: Secondary | ICD-10-CM | POA: Insufficient documentation

## 2014-05-17 DIAGNOSIS — Z79899 Other long term (current) drug therapy: Secondary | ICD-10-CM | POA: Diagnosis not present

## 2014-05-17 NOTE — ED Provider Notes (Signed)
CSN: [id number]     Arrival date & time 05/17/14  2038 History   First MD Initiated Contact with Patient 05/17/14 2259     Chief Complaint  Patient presents with  . Leg Swelling     (Consider location/radiation/quality/duration/timing/severity/associated sxs/prior Treatment) HPI  [name] is a 68 y.o. female with past medical history of DVT and PE, actively taking Xarelto complaining of left lower extremity swelling worsening over the last 3 days. Patient denies trauma, states that when she woke up the lip was swollen. She denies chest pain, cough, shortness of breath, fever chills. States that there is discomfort when she weightbears, states she's and living with a cane now. She has not done any long trips or any significant mobilizations. She is a foster parent and is traveling to  [facility] frequently.   Past Medical History  Diagnosis Date  . Hyperlipidemia   . 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 Recurrent pulmonary embolism     see pulmonologist Dr. Asencion Noble  . Obesity (BMI 30-39.9)   . DJD (degenerative joint disease) of hip   . Sprain and strain of wrist   . CKD (chronic kidney disease), stage III     pt doesn't know why this is listed  . GERD (gastroesophageal reflux disease)     h/o in the 1990s  . Heart murmur   . Hypertension     sees Dr. Gilda Crease  . Anemia    Past Surgical History  Procedure Laterality Date  . Partial hip arthroplasty  Left, 02/2009    Dr Hal Morales  . Shoulder surgery  Left 07/15/08    Dr Hal Morales  . Femoral vein ligation  12/2002  . Greenfield filter  11/03/2003    R Jugular  . Total hip arthroplasty  R 05/2203, L 2010  . Tubal ligation  1981  . Transthoracic echocardiogram      EF 55-60%, RV mod dilated, mildly increased LV wall thickness  . Dilation and curettage of uterus  02/21/2012     Procedure: DILATATION AND CURETTAGE;  Surgeon: Emily Filbert, MD;  Location: [facility];  Service: Gynecology;  Laterality: N/A;  . Carpal tunnel release  09/02/2012    Procedure: CARPAL TUNNEL RELEASE;  Surgeon: Winfield Cunas, MD;  Location: [facility];  Service: Neurosurgery;  Laterality: Right;  RIGHT carpal tunnel release   Family History  Problem Relation Age of Onset  . Heart disease Mother   . Heart disease Father   . Cancer Brother   . Cancer Sister    History  Substance Use Topics  . Smoking status: Never Smoker   . Smokeless tobacco: Never Used  . Alcohol Use: No   OB History   Grav Para Term Preterm Abortions TAB SAB Ect Mult Living   5 2 2  3  3   2      Review of Systems  10 systems reviewed and found to be negative, except as noted in the HPI.   Allergies  Review of patient's allergies indicates no known allergies.  Home Medications   Prior to Admission medications   Medication Sig Start Date End Date Taking? Authorizing Provider  amitriptyline (ELAVIL) 25 MG tablet Take 2 tablets (50 mg total) by mouth at bedtime. 04/12/14  Yes Star Age, MD  ferrous sulfate 325 (65 FE) MG tablet Take 325  mg by mouth daily with breakfast.   Yes Historical Provider, MD  furosemide (LASIX) 20 MG tablet Take 20 mg by mouth daily. Taking 1 tablet for 2 days, then skips for 1-2 days and repeats   Yes Historical Provider, MD  metoprolol succinate (TOPROL-XL) 50 MG 24 hr tablet Take 50 mg by mouth at bedtime. Take with or immediately following a meal.   Yes Historical Provider, MD  omeprazole (PRILOSEC) 20 MG capsule Take 1 capsule (20 mg total) by mouth daily. 02/17/14  Yes Leeanne Rio, MD  rivaroxaban (XARELTO) 20 MG TABS tablet Take 20 mg by mouth daily with supper.   Yes Historical Provider, MD  spironolactone (ALDACTONE) 50 MG tablet Take 50 mg by mouth daily.   Yes Historical Provider, MD  verapamil (CALAN-SR) 240 MG CR tablet Take 240 mg by mouth at bedtime.   Yes Historical  Provider, MD   BP 148/77  Pulse 83  Temp(Src) 97.7 F (36.5 C) (Oral)  Resp 16  SpO2 94% Physical Exam  Nursing note and vitals reviewed. Constitutional: She is oriented to person, place, and time. She appears well-developed and well-nourished. No distress.  HENT:  Head: Normocephalic and atraumatic.  Mouth/Throat: Oropharynx is clear and moist.  Eyes: Conjunctivae and EOM are normal. Pupils are equal, round, and reactive to light.  Neck: Normal range of motion.  Cardiovascular: Normal rate, regular rhythm and intact distal pulses.   Pulmonary/Chest: Effort normal and breath sounds normal. No stridor. No respiratory distress. She has no wheezes. She has no rales. She exhibits no tenderness.  Abdominal: Soft. Bowel sounds are normal.  Musculoskeletal: Normal range of motion.  Neurological: She is alert and oriented to person, place, and time.  Skin:  Left lower extremity with 3+ pitting edema from midshin down, no superficial collaterals and Homans sign is negative. Distal pulses palpable. There is no warmth or induration.  Psychiatric: She has a normal mood and affect.    ED Course  Procedures (including critical care time) Labs Review Labs Reviewed - No data to display  Imaging Review No results found.   EKG Interpretation None      MDM   Final diagnoses:  Swelling of left lower extremity    Filed Vitals:   05/17/14 2141 05/17/14 2330  BP: 150/86 148/77  Pulse: 86 83  Temp: 97.9 F (36.6 C) 97.7 F (36.5 C)  TempSrc: Oral Oral  Resp: 16   SpO2: 98% 94%     [name] is a 68 y.o. female presenting with left lower extremity swelling and pain, atraumatic. Patient has history of DVT, she is anticoagulated with Xarelto, it is too late to obtain a vascular duplex study. Patient has no signs of PE. We'll set up surgery for her in the morning. Encouraged her to remain compliant with her anticoagulation and return to the ED if she develops any signs of PE.    Discussed case with attending MD who agrees with plan and stability to d/c to home.    Evaluation does not show pathology that would require ongoing emergent intervention or inpatient treatment. Pt is hemodynamically stable and mentating appropriately. Discussed findings and plan with patient/guardian, who agrees with care plan. All questions answered. Return precautions discussed and outpatient follow up given.     Monico Blitz, PA-C 05/18/14 0110

## 2014-05-17 NOTE — ED Notes (Signed)
Pt states she woke up Sunday and her left leg was swollen and warm, pt has hx of DVT

## 2014-05-17 NOTE — Discharge Instructions (Signed)
Continue to take her Xarelto  Go to the vascular lab at [facility] tomorrow for your ultrasound study.  Return to the emergency room or call 911 if you develop any chest pain, shortness of breath, cough  .

## 2014-05-18 ENCOUNTER — Ambulatory Visit (HOSPITAL_COMMUNITY)
Admission: RE | Admit: 2014-05-18 | Discharge: 2014-05-18 | Disposition: A | Discharge status: Home / Self Care | Payer: Medicare HMO | Source: Ambulatory Visit | Attending: Internal Medicine | Admitting: Internal Medicine

## 2014-05-18 DIAGNOSIS — M7989 Other specified soft tissue disorders: Secondary | ICD-10-CM | POA: Insufficient documentation

## 2014-05-18 NOTE — Progress Notes (Signed)
VASCULAR LAB PRELIMINARY  PRELIMINARY  PRELIMINARY  PRELIMINARY  Left lower extremity venous duplex completed.    Preliminary report:  Left:  No evidence of DVT, superficial thrombosis, or Baker's cyst.  Shakara Tweedy, RVT 05/18/2014, 5:09 PM

## 2014-05-18 NOTE — ED Provider Notes (Signed)
Medical screening examination/treatment/procedure(s) were performed by non-physician practitioner and as supervising physician I was immediately available for consultation/collaboration.   EKG Interpretation None        Bryten Maher, MD 05/18/14 0628 

## 2014-06-06 ENCOUNTER — Encounter (HOSPITAL_COMMUNITY): Payer: Self-pay | Admitting: Emergency Medicine

## 2014-06-09 ENCOUNTER — Other Ambulatory Visit: Payer: Self-pay | Admitting: Neurology

## 2014-06-19 ENCOUNTER — Other Ambulatory Visit: Payer: Self-pay | Admitting: Family Medicine

## 2014-06-21 ENCOUNTER — Telehealth: Payer: Self-pay | Admitting: Family Medicine

## 2014-06-21 NOTE — Telephone Encounter (Signed)
[place] red team, can you call this pt and clarify what her dose of spironolactone is? The current dose documented in her chart is 50 mg daily (two 25mg  tablets). It looks like we've always prescribed 25mg  daily (one pill) in the past, and that's the dose requested for refill.  Please also remind pt she is due for an office visit for routine medical problem f/u.  Thanks! Leeanne Rio, MD

## 2014-06-21 NOTE — Telephone Encounter (Signed)
Refill ordered. Leeanne Rio, MD

## 2014-06-21 NOTE — Telephone Encounter (Signed)
Talked to pt she says its 25 mg. Oma Marzan CMA

## 2014-06-21 NOTE — Telephone Encounter (Signed)
[place] red team, can you call this pt and clarify what her dose of spironolactone is? The current dose documented in her chart is 50 mg daily (two 25mg  tablets). It looks like we've always prescribed 25mg  daily (one pill) in the past, and that's the dose requested for refill.  Please also remind pt she is due for an office visit for routine medical problem f/u.  Thanks! Leeanne Rio, MD

## 2014-06-28 ENCOUNTER — Other Ambulatory Visit: Payer: Self-pay | Admitting: Family Medicine

## 2014-07-01 ENCOUNTER — Other Ambulatory Visit: Payer: Self-pay | Admitting: Family Medicine

## 2014-07-04 ENCOUNTER — Encounter: Payer: Self-pay | Admitting: Neurology

## 2014-07-04 ENCOUNTER — Ambulatory Visit (INDEPENDENT_AMBULATORY_CARE_PROVIDER_SITE_OTHER): Payer: Commercial Managed Care - HMO | Admitting: Neurology

## 2014-07-04 VITALS — BP 145/87 | HR 102

## 2014-07-04 DIAGNOSIS — E669 Obesity, unspecified: Secondary | ICD-10-CM

## 2014-07-04 DIAGNOSIS — G4710 Hypersomnia, unspecified: Secondary | ICD-10-CM

## 2014-07-04 DIAGNOSIS — R400 Somnolence: Secondary | ICD-10-CM

## 2014-07-04 DIAGNOSIS — R51 Headache: Secondary | ICD-10-CM

## 2014-07-04 DIAGNOSIS — R351 Nocturia: Secondary | ICD-10-CM

## 2014-07-04 DIAGNOSIS — R519 Headache, unspecified: Secondary | ICD-10-CM

## 2014-07-04 DIAGNOSIS — G478 Other sleep disorders: Secondary | ICD-10-CM

## 2014-07-04 NOTE — Progress Notes (Signed)
Subjective:    Patient ID: [name] is a 68 y.o. female.  HPI     Interim history:   [name] is a very pleasant 68 year old right-handed woman with an underlying complex medical history of chronic kidney disease, recurrent thrombophlebitis and recurrent PE (previously on coumadin, now on Xarelto), hyperlipidemia, anemia, gout and OA, s/p b/l THRs, who presents for followup consultation of her HAs and episodic arm numbness and weakness. She is accompanied by her youngest son and her youngest foster child today and requested a sooner than scheduled appointment because of recurrent headaches. I last saw her on 04/12/2014, at which time she reported doing well. He was taking amitriptyline. She had arthritis in the knees. As amitriptyline was helpful I suggested she continue with amitriptyline 50 mg each night.  Today, she reports a throbbing b/l fronto-temporal HA, no N/V, but has some photophobia. She has a lot of stress, she has nocturia x 3, and she does not sleep well. She has not had any further numbness, and no weakness. The headaches are not severe, but occur every other day. She has not had her eyes checked in nearly 2 years. Her son reports that she does not eat well. She also endorses that she does not drink enough water.  10/07/2013, at which time we talked about all her test results she had blood work through her primary care provider on 01/10/2014 which I reviewed: She had stable findings as outlined below, and she reported that amitriptyline had been helpful. She had had an elevation in CK level in the past but recheck recently was normal. She saw NSG and there was no intervention needed. 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 She ran out of elavil and had recurrence of HAs. She had noted no SEs. She was taking 50 mg each night. She stopped the Lipitor d/t GER and her HCTZ was stopped. In the interim, she called on 10/15/2013  reporting that her insurance was not covering her amitriptyline but she wanted to continue using it and pay for it out of pocket as I understand.  I first met her on 02/26/2013, at which time she presented with a two-month history of intermittent episodes of weakness affecting her arms as well as numbness associated. This lasted for about 3-5 minutes at a time. She has also had recurrent headaches with associated nausea, but no vomiting and has had a throbbing, bifrontal HA, and the HA last about 3 hours. She had two episodes while driving. At the time of her first visit with me I felt the differential diagnoses included migraines with neurological manifestation, cervical radiculopathy and her exam was nonfocal. I suggested blood work as well as an EEG, EMG and nerve conduction studies as well as a cervical spine MRI. Symptomatically for her recurrent headaches I tried her on low dose amitriptyline.   Her EEG from 03/04/13 showed: This is a normal EEG recording in the waking and sleeping state. No evidence of ictal or interictal discharges were seen at any time during the recording.   Her EMG and nerve conduction studies from 03/10/2013 showed: Nerve conduction studies done on both upper extremities shows evidence of bilateral carpal tunnel syndrome of moderate severity on the right, mild severity on the left. EMG evaluations of both upper extremities show similar findings consistent with bilateral chronic stable C8 radiculopathies. No other significant abnormalities were seen.  She had CTS surgery on  the R.  MRI cervical spine without contrast on 03/08/2013 showed: 1. At C6-7: moderate spinal stenosis and severe bilateral foraminal stenosis; no cord signal abnormalities, 2. At C5-6, C7-T1: mild-moderate spinal stenosis and severe bilateral foraminal stenosis; no cord signal abnormalities 3. At C3-4, C4-5: mild spinal stenosis and severe bilateral foraminal stenosis; no cord signal abnormalities 4. Multi-level  degenerative spondylosis and disc bulging from C3-4 down to C6-7. Reversal of normal cervical curvature at C4 and C5.   Lab results from 02/26/2013 showed normal SPEP and immunofluorescent electrophoresis, normal rheumatoid factor, normal vitamin D level, normal ANA, normal RPR, normal ESR, normal aldolase, normal CRP, normal CK-MB but elevated CK of 438. I suggested we recheck it down the [place]. Based on her imaging and EMG test results I referred her to neurosurgery.    She had an outside TIA workup which included MRI and MRA brain, echocardiogram and carotid Doppler studies which were unremarkable with less than 39% stenosis in b/l ICAs and her MRI/MRA brain from 01/27/13: Normal MRI appearance of the brain. Left sphenoid sinusitis with opacification of greater than 50%. Mild distal small vessel disease. No significant proximal stenosis, aneurysm, or branch vessel occlusion.    Her Past Medical History Is Significant For: Past Medical History  Diagnosis Date  . Hyperlipidemia   . 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 Recurrent pulmonary embolism     see pulmonologist Dr. Asencion Noble  . Obesity (BMI 30-39.9)   . DJD (degenerative joint disease) of hip   . Sprain and strain of wrist   . CKD (chronic kidney disease), stage III     pt doesn't know why this is listed  . GERD (gastroesophageal reflux disease)     h/o in the 1990s  . Heart murmur   . Hypertension     sees Dr. Gilda Crease  . Anemia     Her Past Surgical History Is Significant For: Past Surgical History  Procedure Laterality Date  . Partial hip arthroplasty  Left, 02/2009    Dr Hal Morales  . Shoulder surgery  Left 07/15/08    Dr Hal Morales  . Femoral vein ligation  12/2002  . Greenfield filter  11/03/2003    R Jugular  . Total hip arthroplasty  R 05/2203, L 2010  . Tubal ligation  1981  .  Transthoracic echocardiogram      EF 55-60%, RV mod dilated, mildly increased LV wall thickness  . Dilation and curettage of uterus  02/21/2012    Procedure: DILATATION AND CURETTAGE;  Surgeon: Emily Filbert, MD;  Location: [facility];  Service: Gynecology;  Laterality: N/A;  . Carpal tunnel release  09/02/2012    Procedure: CARPAL TUNNEL RELEASE;  Surgeon: Winfield Cunas, MD;  Location: [facility];  Service: Neurosurgery;  Laterality: Right;  RIGHT carpal tunnel release    Her Family History Is Significant For: Family History  Problem Relation Age of Onset  . Heart disease Mother   . Heart disease Father   . Cancer Brother   . Cancer Sister     Her Social History Is Significant For: History   Social History  . Marital Status: Widowed    Spouse Name: N/A    Number of Children: 2  . Years of Education: College   Occupational History  .     Social History Main Topics  .  Smoking status: Never Smoker   . Smokeless tobacco: Never Used  . Alcohol Use: No  . Drug Use: No  . Sexual Activity: Not Currently    Birth Control/ Protection: None   Other Topics Concern  . 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    Lives at home with  daughter born in '87.    Non smoker. Non alcohol drinker.;    Fosters children .             Her Allergies Are:  No Known Allergies:   Her Current Medications Are:  Outpatient Encounter Prescriptions as of 07/04/2014  Medication Sig  . amitriptyline (ELAVIL) 25 MG tablet Take 2 tablets (50 mg total) by mouth at bedtime.  . ferrous sulfate 325 (65 FE) MG tablet Take 650 mg by mouth daily with breakfast.   . furosemide (LASIX) 20 MG tablet Take 20 mg by mouth daily. Taking 1 tablet for 2 days, then skips for 1-2 days and repeats  . metoprolol succinate (TOPROL-XL) 50 MG 24 hr tablet Take 50 mg by mouth at bedtime. Take with or immediately following a meal.  . omeprazole  (PRILOSEC) 20 MG capsule TAKE ONE CAPSULE BY MOUTH EVERY DAY  . rivaroxaban (XARELTO) 20 MG TABS tablet Take 20 mg by mouth daily with supper.  Marland Kitchen spironolactone (ALDACTONE) 25 MG tablet TAKE 1 TABLET EVERY DAY  . verapamil (CALAN-SR) 240 MG CR tablet Take 240 mg by mouth at bedtime.  :  Review of Systems:  Out of a complete 14 point review of systems, all are reviewed and negative with the exception of these symptoms as listed below:   Review of Systems Recurrent HAs, nocturia  Objective:  Neurologic Exam  Physical Exam Physical Examination:   Filed Vitals:   07/04/14 0824  BP: 145/87  Pulse: 102     General Examination: The patient is a very pleasant 68 y.o. female in no acute distress. She appears well-developed and well-nourished and well groomed. She is in good spirits today.  HEENT: Normocephalic, atraumatic, pupils are equal, round and reactive to light and accommodation. Funduscopic exam is normal with sharp disc margins noted. Mild b/l cataracts are noted. Extraocular tracking is good without limitation to gaze excursion or nystagmus noted. Normal smooth pursuit is noted. Hearing is grossly intact. Face is symmetric with normal facial animation and normal facial sensation. Speech is clear with no dysarthria noted. There is no hypophonia. There is no lip, neck/head, jaw or voice tremor. Neck is supple with full range of passive and active motion. There are no carotid bruits on auscultation. Oropharynx exam reveals: mild mouth dryness, adequate dental hygiene and moderate airway crowding, due to larger tongue and redundant soft palate. Mallampati is class II. Tongue protrudes centrally and palate elevates symmetrically.  Chest: Clear to auscultation without wheezing, rhonchi or crackles noted.  Heart: S1+S2+0, regular and normal without murmurs, rubs or gallops noted.   Abdomen: Soft, non-tender and non-distended with normal bowel sounds appreciated on  auscultation.  Extremities: There is 1 + pitting edema in the distal lower extremities bilaterally. Pedal pulses are intact.  Skin: Warm and dry without trophic changes noted. There are no varicose veins, but she has significant discoloration to her legs from prior phlebitis, unchanged.  Musculoskeletal: exam reveals no obvious joint deformities, tenderness or joint swelling or erythema, with the exception of b/l knee pain.   Neurologically:  Mental status: The patient is awake, alert and oriented in all 4 spheres. Her memory, attention, language and knowledge are appropriate. There is no aphasia, agnosia, apraxia or anomia. Speech is clear with normal prosody and enunciation. Thought process is linear. Mood is congruent and affect is normal.  Cranial nerves are as described above under HEENT exam. In addition, shoulder shrug is normal with equal shoulder height noted. Motor exam: Normal bulk, strength and tone is noted. There is no drift, tremor or rebound, no atrophy or fasciculations. Romberg is negative. Reflexes are 1+ in the UEs and diminished in the LEs. Toes are downgoing bilaterally. Fine motor skills are intact with normal finger taps, normal hand movements, normal rapid alternating patting, normal foot taps and normal foot agility.  Cerebellar testing shows no dysmetria or intention tremor on finger to nose testing. There is no truncal or gait ataxia.  Sensory exam is intact to light touch in the upper and lower extremities.  Gait, station and balance: She has trouble standing and walks with a limp, almost a waddle. Posture is moderately stooped with a significant kink in the lower back, unchanged. No veering to one side is noted. No leaning to one side is noted. Posture is age-appropriate and stance is wide based. No problems turning are noted. She turns en bloc. Tandem walk is not possible for her.              Assessment and plan:   In summary, [name] is a very pleasant  68 year old female with an underlying complex medical history of chronic kidney disease, recurrent thrombophlebitis and recurrent PE (previously on coumadin, now on Xarelto), hyperlipidemia, anemia, gout and OA, s/p b/l THRs, who presents for followup consultation of her HAs. She most likely has tension type headaches, and triggers include poor sleep, not eating well, not drinking enough water and most significantly, stress.  Her exam is stable, but she is advised to have her eyes checked. She reports ongoing improvement with Elavil, which she is tolerating well at this time. W/u is in keeping with degenerative spine disease and CTS and recurrent HAs. She had CK elevation in the past which upon recheck was normal. She has had an extensive vascular and neurophysiological w/u in the past. Symptomatically, for her headaches I would like to continue her on Elavil. She is advised about potential HAs triggers today and given her nocturia, poor quality sleep, obesity and airway tightness, I would like to proceed with a PSG. I will see her back after the sleep study. She and her son were in agreement.

## 2014-07-04 NOTE — Patient Instructions (Signed)
Please remember, common headache triggers are: sleep deprivation, dehydration, overheating, stress, hypoglycemia or skipping meals and blood sugar fluctuations, excessive pain medications or excessive alcohol use or caffeine withdrawal. Some people have food triggers such as aged cheese, orange juice or chocolate, especially dark chocolate, or MSG (monosodium glutamate). Try to avoid these headache triggers as much possible. It may be helpful to keep a headache diary to figure out what makes your headaches worse or brings them on and what alleviates them. Some people report headache onset after exercise but studies have shown that regular exercise may actually prevent headaches from coming. If you have exercise-induced headaches, please make sure that you drink plenty of fluid before and after exercising and that you do not over do it and do not overheat.  We will do a sleep study and I will see you back afterwards.

## 2014-07-25 ENCOUNTER — Other Ambulatory Visit: Payer: Self-pay | Admitting: Critical Care Medicine

## 2014-08-15 ENCOUNTER — Encounter: Payer: Self-pay | Admitting: Family Medicine

## 2014-08-15 ENCOUNTER — Ambulatory Visit (INDEPENDENT_AMBULATORY_CARE_PROVIDER_SITE_OTHER): Payer: Commercial Managed Care - HMO | Admitting: Family Medicine

## 2014-08-15 VITALS — BP 135/81 | HR 87 | Temp 98.1°F | Ht 62.0 in | Wt 270.0 lb

## 2014-08-15 DIAGNOSIS — Z23 Encounter for immunization: Secondary | ICD-10-CM | POA: Diagnosis not present

## 2014-08-15 DIAGNOSIS — I10 Essential (primary) hypertension: Secondary | ICD-10-CM

## 2014-08-15 DIAGNOSIS — M170 Bilateral primary osteoarthritis of knee: Secondary | ICD-10-CM | POA: Diagnosis not present

## 2014-08-15 DIAGNOSIS — Z0000 Encounter for general adult medical examination without abnormal findings: Secondary | ICD-10-CM | POA: Insufficient documentation

## 2014-08-15 MED ORDER — ZOSTER VACCINE LIVE 19400 UNT/0.65ML ~~LOC~~ SOLR: 0.6500 mL | Freq: Once | SUBCUTANEOUS | Status: DC

## 2014-08-15 NOTE — Progress Notes (Signed)
Patient ID: [name], female   DOB: [birth year], 69 y.o.   MRN: [id number]   HPI  Patient presents today for bilateral knee pain  Patient states that she's had knee pain for years and unstable. She denies any recent trauma or exacerbations. She also denies locking or popping symptoms. She previously had steroid injections which did not help her pain. She did have a series of 5 hyaluronic acid injections at her orthopedic surgeon's office which helped for 3 years she states.  She's been offered knee surgery by orthopedic surgeon and declined it.  She comes in today requesting hyaluronic acid injection, explained that we do not perform that in our clinic and she would like referral to orthopedic surgery today.   Smoking status noted ROS: Per HPI  Objective: BP 135/81 mmHg  Pulse 87  Temp(Src) 98.1 F (36.7 C) (Oral)  Ht 5\' 2"  (1.575 m)  Wt 270 lb (122.471 kg)  BMI 49.37 kg/m2 Gen: NAD, alert, cooperative with exam HEENT: NCAT Neuro: Alert and oriented, No gross deficits  MSK: BL knee without erythema, effusion, bruising, or gross deformity No crepitus or joint line tenderness BL  Assessment and plan:  Osteoarthritis of both knees Stable osteoarthritis bilateral knees Patient has had good results with hyaluronic acid injections in the past, would like referral to orthopedics for this Review films with her, moderate to severe arthritis on films Encouraged Tylenol and Aspercreme for pain Referral written to orthopedics today  Healthcare maintenance Flu and Prevnar shot today Given prescription for Zostavax  HYPERTENSION, BENIGN SYSTEMIC Initially elevated but normal on recheck Continue current meds including HCTZ, Toprol, Lasix, spironolactone Follow-up with PCP in 2-3 months    Orders Placed This Encounter  Procedures  . Ambulatory referral to Orthopedic Surgery    Referral Priority:  Routine    Referral Type:  Surgical    Referral Reason:  Specialty Services  Required    Requested Specialty:  Orthopedic Surgery    Number of Visits Requested:  1

## 2014-08-15 NOTE — Assessment & Plan Note (Signed)
Stable osteoarthritis bilateral knees Patient has had good results with hyaluronic acid injections in the past, would like referral to orthopedics for this Review films with her, moderate to severe arthritis on films Encouraged Tylenol and Aspercreme for pain Referral written to orthopedics today

## 2014-08-15 NOTE — Addendum Note (Signed)
Addended by: Maryland Pink on: 08/15/2014 12:18 PM   Modules accepted: Orders

## 2014-08-15 NOTE — Assessment & Plan Note (Signed)
Initially elevated but normal on recheck Continue current meds including HCTZ, Toprol, Lasix, spironolactone Follow-up with PCP in 2-3 months

## 2014-08-15 NOTE — Patient Instructions (Signed)
Great to meet you today!  We will get your appointment arranged with orthopedics  You can have Tylenol or Aspercreme for your pain, You may take 2 Tylenol up to 3 times a day  Please come back to see Dr. Ardelia Mems in  For follow up of your blood pressure

## 2014-08-15 NOTE — Assessment & Plan Note (Signed)
Flu and Prevnar shot today Given prescription for Zostavax

## 2014-08-21 ENCOUNTER — Ambulatory Visit (INDEPENDENT_AMBULATORY_CARE_PROVIDER_SITE_OTHER): Payer: Commercial Managed Care - HMO | Admitting: Neurology

## 2014-08-21 VITALS — BP 142/86

## 2014-08-21 DIAGNOSIS — G4761 Periodic limb movement disorder: Secondary | ICD-10-CM

## 2014-08-21 DIAGNOSIS — G4733 Obstructive sleep apnea (adult) (pediatric): Secondary | ICD-10-CM

## 2014-08-21 DIAGNOSIS — R51 Headache: Secondary | ICD-10-CM

## 2014-08-21 DIAGNOSIS — G479 Sleep disorder, unspecified: Secondary | ICD-10-CM

## 2014-08-21 DIAGNOSIS — G4720 Circadian rhythm sleep disorder, unspecified type: Secondary | ICD-10-CM

## 2014-08-21 NOTE — Sleep Study (Signed)
Please see the scanned sleep study interpretation located in the Procedure tab within the Chart Review section. 

## 2014-08-25 ENCOUNTER — Telehealth: Payer: Self-pay | Admitting: Neurology

## 2014-08-25 DIAGNOSIS — G4761 Periodic limb movement disorder: Secondary | ICD-10-CM

## 2014-08-25 DIAGNOSIS — G4733 Obstructive sleep apnea (adult) (pediatric): Secondary | ICD-10-CM

## 2014-08-25 NOTE — Telephone Encounter (Signed)
Please call and notify the patient that the recent sleep study did confirm the diagnosis of obstructive sleep apnea (which is severe in REM sleep), and that I recommend treatment for this in the form of CPAP. This will require a repeat sleep study for proper titration and mask fitting. Please explain to patient and arrange for a CPAP titration study. I have placed an order in the chart. Thanks, Star Age, MD, PhD [facility])

## 2014-08-29 ENCOUNTER — Encounter: Payer: Self-pay | Admitting: Neurology

## 2014-08-29 NOTE — Telephone Encounter (Signed)
Patient was called and provided the results of her NPSG sleep study.  She was informed that there was a diagnosis of obstructive sleep apnea and treatment in the form of CPAP therapy was recommended.  Patient was instructed that this involved a second night sleep study at the sleep lab.  Patient was in agreement and her CPAP study was scheduled for Feb. 14th at 09:30 pm.

## 2014-09-03 ENCOUNTER — Other Ambulatory Visit: Payer: Self-pay | Admitting: Family Medicine

## 2014-09-23 ENCOUNTER — Other Ambulatory Visit: Payer: Self-pay | Admitting: Family Medicine

## 2014-09-26 ENCOUNTER — Telehealth: Payer: Self-pay | Admitting: *Deleted

## 2014-09-26 NOTE — Telephone Encounter (Signed)
Pt called stating she has been coughing and now has laryngitis. She would like know what OTC medication that would help her since she is taking a blood thinner.  Per Dr. Ardelia Mems pt can take regular Mucinex.  Pt advised to take regular Mucinex; if she is not better in the next few days to call for an appt.  Pt stated understanding.  Derl Barrow, RN

## 2014-09-28 ENCOUNTER — Telehealth: Payer: Self-pay | Admitting: Neurology

## 2014-09-28 NOTE — Telephone Encounter (Signed)
Called and left VM message for patient to return call back for rescheduled time on 10/11/14

## 2014-10-04 ENCOUNTER — Other Ambulatory Visit: Payer: Self-pay | Admitting: Critical Care Medicine

## 2014-10-11 ENCOUNTER — Ambulatory Visit: Payer: Self-pay | Admitting: Neurology

## 2014-10-14 ENCOUNTER — Telehealth: Payer: Self-pay | Admitting: Neurology

## 2014-10-14 NOTE — Telephone Encounter (Signed)
Patient called to reschedule sleep study. 

## 2014-10-14 NOTE — Telephone Encounter (Signed)
Rescheduled appointment

## 2014-11-12 ENCOUNTER — Other Ambulatory Visit: Payer: Self-pay | Admitting: Family Medicine

## 2014-11-12 ENCOUNTER — Other Ambulatory Visit: Payer: Self-pay | Admitting: Critical Care Medicine

## 2014-11-14 NOTE — Telephone Encounter (Signed)
Covering for Dr Ardelia Mems. Refill sent in. Please inform the patient that she needs to come in for follow-up with Dr Ardelia Mems. Thanks.

## 2014-11-21 ENCOUNTER — Telehealth: Payer: Self-pay | Admitting: Critical Care Medicine

## 2014-11-21 MED ORDER — RIVAROXABAN 20 MG PO TABS: ORAL_TABLET | ORAL | Status: DC

## 2014-11-21 NOTE — Telephone Encounter (Signed)
ROV has been scheduled for 12/21/14 at 9am. Rx has been sent to last until then. Nothing further was needed.

## 2014-11-25 ENCOUNTER — Ambulatory Visit: Payer: Self-pay | Admitting: Neurology

## 2014-11-25 ENCOUNTER — Ambulatory Visit: Payer: Commercial Managed Care - HMO | Admitting: Neurology

## 2014-12-16 ENCOUNTER — Telehealth: Payer: Self-pay | Admitting: Family Medicine

## 2014-12-16 NOTE — Telephone Encounter (Signed)
[employer] referral obtained Josem Kaufmann #[id number]

## 2014-12-16 NOTE — Telephone Encounter (Signed)
Needs [facility] referral for followup visit 12-21-14 Dr Joya Gaskins  NPI [id number] Diagnosis  I26.99

## 2014-12-17 ENCOUNTER — Other Ambulatory Visit: Payer: Self-pay | Admitting: Family Medicine

## 2014-12-18 ENCOUNTER — Other Ambulatory Visit: Payer: Self-pay | Admitting: Critical Care Medicine

## 2014-12-21 ENCOUNTER — Other Ambulatory Visit (INDEPENDENT_AMBULATORY_CARE_PROVIDER_SITE_OTHER): Payer: Commercial Managed Care - HMO

## 2014-12-21 ENCOUNTER — Encounter: Payer: Self-pay | Admitting: Critical Care Medicine

## 2014-12-21 ENCOUNTER — Ambulatory Visit (INDEPENDENT_AMBULATORY_CARE_PROVIDER_SITE_OTHER): Payer: Commercial Managed Care - HMO | Admitting: Critical Care Medicine

## 2014-12-21 VITALS — BP 140/70 | HR 97 | Temp 98.2°F | Ht 65.0 in | Wt 267.4 lb

## 2014-12-21 DIAGNOSIS — N183 Chronic kidney disease, stage 3 unspecified: Secondary | ICD-10-CM

## 2014-12-21 DIAGNOSIS — I8312 Varicose veins of left lower extremity with inflammation: Secondary | ICD-10-CM

## 2014-12-21 DIAGNOSIS — I872 Venous insufficiency (chronic) (peripheral): Secondary | ICD-10-CM

## 2014-12-21 DIAGNOSIS — I8311 Varicose veins of right lower extremity with inflammation: Secondary | ICD-10-CM

## 2014-12-21 DIAGNOSIS — I8290 Acute embolism and thrombosis of unspecified vein: Secondary | ICD-10-CM

## 2014-12-21 LAB — CBC
HCT: 41.8 % (ref 36.0–46.0)
Hemoglobin: 13.7 g/dL (ref 12.0–15.0)
MCHC: 32.7 g/dL (ref 30.0–36.0)
MCV: 87.5 fl (ref 78.0–100.0)
Platelets: 415 10*3/uL — ABNORMAL HIGH (ref 150.0–400.0)
RBC: 4.78 Mil/uL (ref 3.87–5.11)
RDW: 15.5 % (ref 11.5–15.5)
WBC: 11.1 10*3/uL — ABNORMAL HIGH (ref 4.0–10.5)

## 2014-12-21 LAB — COMPREHENSIVE METABOLIC PANEL
ALT: 8 U/L (ref 0–35)
AST: 15 U/L (ref 0–37)
Albumin: 4.3 g/dL (ref 3.5–5.2)
Alkaline Phosphatase: 78 U/L (ref 39–117)
BUN: 16 mg/dL (ref 6–23)
CO2: 29 mEq/L (ref 19–32)
Calcium: 10.5 mg/dL (ref 8.4–10.5)
Chloride: 102 mEq/L (ref 96–112)
Creatinine, Ser: 1.43 mg/dL — ABNORMAL HIGH (ref 0.40–1.20)
GFR: 46.85 mL/min — ABNORMAL LOW (ref >60.00)
Glucose, Bld: 93 mg/dL (ref 70–99)
Potassium: 3.9 mEq/L (ref 3.5–5.1)
Sodium: 139 mEq/L (ref 135–145)
Total Bilirubin: 0.3 mg/dL (ref 0.2–1.2)
Total Protein: 8.6 g/dL — ABNORMAL HIGH (ref 6.0–8.3)

## 2014-12-21 MED ORDER — RIVAROXABAN 20 MG PO TABS: 20.0000 mg | ORAL_TABLET | Freq: Every day | ORAL | Status: DC

## 2014-12-21 MED ORDER — TRIAMCINOLONE ACETONIDE 0.025 % EX OINT: 1.0000 "application " | TOPICAL_OINTMENT | Freq: Two times a day (BID) | CUTANEOUS | Status: DC

## 2014-12-21 NOTE — Patient Instructions (Signed)
No change in medications Steroid cream for legs given Stay on [place] today: CMET, CBC Return 1 yr

## 2014-12-21 NOTE — Progress Notes (Signed)
Subjective:    Patient ID: [name], female    DOB: 1945-12-24, 69 y.o.   MRN: [id number]  HPI 12/21/2014 Chief Complaint  Patient presents with  . Yearly Follow up    Breathing doing well overall.  Raised, hard area to right calf x 1 wk.  No SOB, wheezing, chest tightness/CP, or cough.    Doing well with dyspnea.  No PE in 27yrs.  tol blood thinner ok.  On Xarelto 20 mg daily, no active bleeding. No chest pain cough or significant dyspnea.  Patient notes redness and swelling in right calf Pt denies any significant sore throat, nasal congestion or excess secretions, fever, chills, sweats, unintended weight loss, pleurtic or exertional chest pain, orthopnea PND, or leg swelling Pt denies any increase in rescue therapy over baseline, denies waking up needing it or having any early am or nocturnal exacerbations of coughing/wheezing/or dyspnea. Pt also denies any obvious fluctuation in symptoms with  weather or environmental change or other alleviating or aggravating factors   Current Medications, Allergies, Complete Past Medical History, Past Surgical History, Family History, and Social History were reviewed in [employer] record.  Past Medical History  Diagnosis Date  . Hyperlipidemia   . 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 Recurrent pulmonary embolism     see pulmonologist Dr. Shan Levans  . Obesity (BMI 30-39.9)   . DJD (degenerative joint disease) of hip   . Sprain and strain of wrist   . CKD (chronic kidney disease), stage III     pt doesn't know why this is listed  . GERD (gastroesophageal reflux disease)     h/o in the 1990s  . Heart murmur   . Hypertension     sees Dr. Edilia Bo  . Anemia      Family History  Problem Relation Age of Onset  . Heart disease Mother   . Heart disease Father     . Cancer Brother   . Cancer Sister      History   Social History  . Marital Status: Widowed    Spouse Name: N/A  . Number of Children: 2  . Years of Education: College   Occupational History  .     Social History Main Topics  . Smoking status: Never Smoker   . Smokeless tobacco: Never Used  . Alcohol Use: No  . Drug Use: No  . Sexual Activity: Not Currently    Birth Control/ Protection: None   Other Topics Concern  . Not on file   Social History Narrative   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    Lives at home with  daughter born in '87.    Non smoker. Non alcohol drinker.;    Fosters children .              No Known Allergies   Outpatient Prescriptions Prior to Visit  Medication Sig Dispense Refill  . amitriptyline (ELAVIL) 25 MG tablet Take 2 tablets (50 mg total) by mouth at bedtime. 60 tablet 5  . ferrous sulfate 325 (65 FE) MG tablet TAKE 1 TABLET BY MOUTH TWICE A DAY 60 tablet 3  . furosemide (LASIX) 20 MG tablet Take 20 mg by mouth daily. Taking 1 tablet for 2 days, then skips for 1-2 days and  repeats    . metoprolol succinate (TOPROL-XL) 50 MG 24 hr tablet Take 50 mg by mouth at bedtime. Take with or immediately following a meal.    . omeprazole (PRILOSEC) 20 MG capsule TAKE ONE CAPSULE BY MOUTH EVERY DAY 30 capsule 3  . spironolactone (ALDACTONE) 25 MG tablet TAKE 1 TABLET EVERY DAY 90 tablet 0  . verapamil (CALAN-SR) 240 MG CR tablet TAKE 1 TABLET BY MOUTH AT BEDTIME 30 tablet 2  . XARELTO 20 MG TABS tablet TAKE 1 TABLET BY MOUTH EVERY DAY 30 tablet 0  . zoster vaccine live, PF, (ZOSTAVAX) 85462 UNT/0.65ML injection Inject 19,400 Units into the skin once. (Patient not taking: Reported on 12/21/2014) 1 each 0   No facility-administered medications prior to visit.     Review of Systems  Constitutional: Negative for fever, chills, diaphoresis, activity change, appetite change, fatigue and unexpected  weight change.  HENT: Negative for congestion, dental problem, ear discharge, ear pain, facial swelling, hearing loss, mouth sores, nosebleeds, postnasal drip, rhinorrhea, sinus pressure, sneezing, sore throat, tinnitus, trouble swallowing and voice change.   Eyes: Negative for photophobia, discharge, itching and visual disturbance.  Respiratory: Negative for apnea, cough, choking, chest tightness, shortness of breath, wheezing and stridor.   Cardiovascular: Negative for chest pain, palpitations and leg swelling.  Gastrointestinal: Negative for nausea, vomiting, abdominal pain, constipation, blood in stool and abdominal distention.  Genitourinary: Negative for dysuria, urgency, frequency, hematuria, flank pain, decreased urine volume and difficulty urinating.  Musculoskeletal: Negative for myalgias, back pain, joint swelling, arthralgias, gait problem, neck pain and neck stiffness.  Skin: Positive for rash. Negative for color change and pallor.  Neurological: Negative for dizziness, tremors, seizures, syncope, speech difficulty, weakness, light-headedness, numbness and headaches.  Hematological: Negative for adenopathy. Does not bruise/bleed easily.  Psychiatric/Behavioral: Negative for confusion, sleep disturbance and agitation. The patient is not nervous/anxious.        Objective:   Physical Exam  Constitutional: She appears well-nourished. She is active.  Non-toxic appearance. She does not appear ill. No distress.  Obese  HENT:  Head: Normocephalic and atraumatic.  Nose: No mucosal edema, rhinorrhea, sinus tenderness, nasal deformity or septal deviation. No epistaxis. Right sinus exhibits no maxillary sinus tenderness and no frontal sinus tenderness. Left sinus exhibits no maxillary sinus tenderness and no frontal sinus tenderness.  Mouth/Throat: Oropharynx is clear and moist. No oropharyngeal exudate.  Eyes: Conjunctivae and EOM are normal. Pupils are equal, round, and reactive to light.  Right eye exhibits no discharge. Left eye exhibits no discharge. No scleral icterus.  Neck: Normal range of motion. Neck supple. Normal carotid pulses, no hepatojugular reflux and no JVD present. No tracheal tenderness and no muscular tenderness present. Carotid bruit is not present. No rigidity. No tracheal deviation, no edema, no erythema and normal range of motion present. No thyroid mass and no thyromegaly present.  Cardiovascular: Normal rate, regular rhythm, S1 normal, S2 normal, normal heart sounds, intact distal pulses and normal pulses.  PMI is not displaced.  Exam reveals no gallop, no S3, no S4, no distant heart sounds and no friction rub.   No murmur heard.  No systolic murmur is present   No diastolic murmur is present  Pulmonary/Chest: No accessory muscle usage or stridor. No apnea and no tachypnea. No respiratory distress. She has no decreased breath sounds. She has no wheezes. She has no rhonchi. She has no rales. Chest wall is not dull to percussion. She exhibits no mass, no tenderness, no bony tenderness and  no deformity.  Abdominal: Soft. Normal appearance and bowel sounds are normal. She exhibits no distension, no ascites and no mass. There is no hepatosplenomegaly. There is no tenderness. There is no rigidity, no rebound, no guarding and no CVA tenderness.  Musculoskeletal: Normal range of motion.  Lymphadenopathy:       Head (right side): No submental and no submandibular adenopathy present.       Head (left side): No submental and no submandibular adenopathy present.    She has no cervical adenopathy.    She has no axillary adenopathy.  Neurological: She is alert. She has normal strength and normal reflexes. No cranial nerve deficit or sensory deficit.  Skin: Skin is warm and dry. Rash noted. No bruising, no ecchymosis and no lesion noted. She is not diaphoretic. There is erythema. No cyanosis. No pallor. Nails show no clubbing.     Chronic venous stasis dermatitis    Psychiatric: She has a normal mood and affect. Her speech is normal and behavior is normal.  Vitals reviewed.         Assessment & Plan:  I personally reviewed all images and lab data in the [facility] system as well as any outside material available during this office visit and agree with the  radiology impressions.  I also have reviewed any data /notes/records if available in care everywhere.  VTE (venous thromboembolism) History of DVT and pulmonary embolism now on lifelong anticoagulation currently on Xarelto and also has an inferior vena cava filter No strict contraindications to Xarelto use No evidence of DVT recurrence Plan Maintain Xarelto 20 mg daily for life Follow-up liver function profile along with renal function and CBC today Steroid cream issued for mild venous stasis dermatitis and right lower extremity   Venous stasis dermatitis Venous stasis dermatitis Kenalog cream issued for topical use    Teresina was seen today for yearly follow up.  Diagnoses and all orders for this visit:  VTE (venous thromboembolism) Orders: -     Comp Met (CMET); Future -     CBC; Future  CKD (chronic kidney disease) stage 3, GFR 30-59 ml/min Orders: -     Comp Met (CMET); Future  Venous stasis dermatitis of both lower extremities  Other orders -     rivaroxaban (XARELTO) 20 MG TABS tablet; Take 1 tablet (20 mg total) by mouth daily. -     triamcinolone (KENALOG) 0.025 % ointment; Apply 1 application topically 2 (two) times daily. As needed to affected area    I had an extended discussion with the patient reviewing all relevant studies completed to date and  lasting 15 to 20 minutes of a 25 minute visit on the following ongoing concerns:   Need for long-term anticoagulation and laboratory monitoring

## 2014-12-21 NOTE — Assessment & Plan Note (Signed)
Venous stasis dermatitis Kenalog cream issued for topical use

## 2014-12-21 NOTE — Progress Notes (Signed)
Quick Note:  Call pt and tell her labs are ok, No change in medications ______ 

## 2014-12-21 NOTE — Progress Notes (Signed)
Quick Note:  Called, spoke with pt. Discussed lab results and recs per Dr. Joya Gaskins. She verbalized understanding and voiced no further questions or concerns at this time. ______

## 2014-12-21 NOTE — Assessment & Plan Note (Signed)
History of DVT and pulmonary embolism now on lifelong anticoagulation currently on Xarelto and also has an inferior vena cava filter No strict contraindications to Xarelto use No evidence of DVT recurrence Plan Maintain Xarelto 20 mg daily for life Follow-up liver function profile along with renal function and CBC today Steroid cream issued for mild venous stasis dermatitis and right lower extremity

## 2014-12-24 ENCOUNTER — Other Ambulatory Visit: Payer: Self-pay | Admitting: Family Medicine

## 2014-12-26 ENCOUNTER — Telehealth: Payer: Self-pay | Admitting: Family Medicine

## 2014-12-26 NOTE — Telephone Encounter (Signed)
Left message

## 2014-12-26 NOTE — Telephone Encounter (Signed)
To [facility] red team - please call pt and let her know I have refilled her spironolactone but she needs to schedule an appointment to follow up on her medical problems. Thanks!  Leeanne Rio, MD

## 2014-12-27 NOTE — Telephone Encounter (Signed)
Left message on patient voicemail that rx had been sent in and to call and schedule a follow up appointment.

## 2014-12-31 ENCOUNTER — Other Ambulatory Visit: Payer: Self-pay | Admitting: Family Medicine

## 2015-01-17 ENCOUNTER — Ambulatory Visit: Payer: Commercial Managed Care - HMO | Admitting: Family Medicine

## 2015-02-04 ENCOUNTER — Other Ambulatory Visit: Payer: Self-pay | Admitting: Family Medicine

## 2015-03-11 ENCOUNTER — Other Ambulatory Visit: Payer: Self-pay | Admitting: Family Medicine

## 2015-03-20 DIAGNOSIS — R262 Difficulty in walking, not elsewhere classified: Secondary | ICD-10-CM | POA: Diagnosis not present

## 2015-03-20 DIAGNOSIS — M170 Bilateral primary osteoarthritis of knee: Secondary | ICD-10-CM | POA: Diagnosis not present

## 2015-04-04 ENCOUNTER — Other Ambulatory Visit: Payer: Self-pay | Admitting: Family Medicine

## 2015-04-04 ENCOUNTER — Other Ambulatory Visit: Payer: Self-pay | Admitting: *Deleted

## 2015-04-05 ENCOUNTER — Other Ambulatory Visit: Payer: Self-pay | Admitting: Family Medicine

## 2015-04-05 MED ORDER — OMEPRAZOLE 20 MG PO CPDR: 20.0000 mg | DELAYED_RELEASE_CAPSULE | Freq: Every day | ORAL | Status: DC

## 2015-05-08 ENCOUNTER — Other Ambulatory Visit: Payer: Self-pay | Admitting: Neurology

## 2015-05-08 ENCOUNTER — Telehealth: Payer: Self-pay | Admitting: Family Medicine

## 2015-05-08 ENCOUNTER — Other Ambulatory Visit: Payer: Self-pay | Admitting: Family Medicine

## 2015-05-08 NOTE — Telephone Encounter (Signed)
To [facility] red team - please call pt and let her know I have refilled her medicine for 1 month but she needs to schedule an appointment to follow up on her medical problems. I won't be able to refill again until she comes in for an appointment. Thanks!  Leeanne Rio, MD

## 2015-05-09 NOTE — Telephone Encounter (Signed)
Left message on patient voicemail informing of message for MD, asked that she call back to schedule an appointment.

## 2015-05-16 ENCOUNTER — Ambulatory Visit (INDEPENDENT_AMBULATORY_CARE_PROVIDER_SITE_OTHER): Payer: Commercial Managed Care - HMO | Admitting: Family Medicine

## 2015-05-16 ENCOUNTER — Encounter: Payer: Self-pay | Admitting: Family Medicine

## 2015-05-16 VITALS — BP 145/90 | HR 100 | Temp 98.0°F | Ht 65.0 in | Wt 275.0 lb

## 2015-05-16 DIAGNOSIS — I10 Essential (primary) hypertension: Secondary | ICD-10-CM | POA: Diagnosis not present

## 2015-05-16 DIAGNOSIS — Z114 Encounter for screening for human immunodeficiency virus [HIV]: Secondary | ICD-10-CM

## 2015-05-16 DIAGNOSIS — Z13220 Encounter for screening for lipoid disorders: Secondary | ICD-10-CM

## 2015-05-16 DIAGNOSIS — K219 Gastro-esophageal reflux disease without esophagitis: Secondary | ICD-10-CM

## 2015-05-16 DIAGNOSIS — Z1159 Encounter for screening for other viral diseases: Secondary | ICD-10-CM

## 2015-05-16 DIAGNOSIS — D649 Anemia, unspecified: Secondary | ICD-10-CM

## 2015-05-16 DIAGNOSIS — E785 Hyperlipidemia, unspecified: Secondary | ICD-10-CM

## 2015-05-16 NOTE — Progress Notes (Signed)
Patient ID: [name], female   DOB: 11/05/1945, 69 y.o.   MRN: [id number] Date of Visit: 05/16/2015   HPI:  Anemia - has been on iron for several years. Recently ran out.   hypertension - takes metoprolol, verapamil, spironolactone. No issues with these medications. Denies CP or shortness of breath.   GERD - taking PPI, good relief of gerd symptoms with this.  Edema - takes lasix every other day. Works well.  ROS: See HPI.  [place]: history of anemia, CK3, GERD, HLD, hypertension, bilateral knee osteoarthritis, prior DVT and PE  PHYSICAL EXAM: BP 145/90 mmHg  Pulse 100  Temp(Src) 98 F (36.7 C) (Oral)  Ht 5\' 5"  (1.651 m)  Wt 275 lb (124.739 kg)  BMI 45.76 kg/m2 Gen: NAD, pleasant, cooperative HEENT: NCAT Heart: regular rate and rhythm no murmur Lungs: clear to auscultation bilaterally, NWOB Neuro: grossly nonfocal, speech normal Ext: minimal edema lower ext bilaterally   ASSESSMENT/PLAN:  Health maintenance:  -instructed patient to call and schedule mammogram -flu shot given today -rx and handout provided for tetanus & shingles vaccines -will have our staff call & obtain records on last colonoscopy (pt thinks this was 5 years ago at [place]) -screen for hep C & HIV today  Hyperlipidemia Check lipids today. Recent normal LFT's  GERD (gastroesophageal reflux disease) Well controlled. Continue current regimen.   ANEMIA, OTHER, UNSPECIFIED All recent CBC's have been normal Hgb. Will do trial off iron. Follow up in 3 mos for recheck of CBC.  HYPERTENSION, BENIGN SYSTEMIC Well controlled. Continue current regimen.    FOLLOW UP: F/u in 3 months for routine medical problems  [place] J. Ardelia Mems, [place]

## 2015-05-16 NOTE — Assessment & Plan Note (Signed)
Well-controlled.  Continue current regimen. 

## 2015-05-16 NOTE — Assessment & Plan Note (Signed)
Check lipids today. Recent normal LFT's

## 2015-05-16 NOTE — Assessment & Plan Note (Signed)
All recent CBC's have been normal Hgb. Will do trial off iron. Follow up in 3 mos for recheck of CBC.

## 2015-05-16 NOTE — Patient Instructions (Signed)
Will send in refills on medications Labs today: cholesterol, kidneys, Hepatitis C, HIV  See handouts & prescription for shingles vaccine &  Tetanus vaccine - take to your pharmacy  Trial off of iron - will check hemoglobin in 3 months  Call to schedule your mammogram  Flu shot today  See me in 3 months  Be well, Dr. Ardelia Mems

## 2015-05-17 LAB — HIV ANTIBODY (ROUTINE TESTING W REFLEX): HIV 1&2 Ab, 4th Generation: NONREACTIVE

## 2015-05-17 LAB — HEPATITIS C ANTIBODY: HCV Ab: NEGATIVE

## 2015-05-17 LAB — BASIC METABOLIC PANEL
BUN: 14 mg/dL (ref 7–25)
CO2: 31 mmol/L (ref 20–31)
Calcium: 9.4 mg/dL (ref 8.6–10.4)
Chloride: 101 mmol/L (ref 98–110)
Creat: 1.15 mg/dL — ABNORMAL HIGH (ref 0.50–0.99)
Glucose, Bld: 83 mg/dL (ref 65–99)
Potassium: 4.5 mmol/L (ref 3.5–5.3)
Sodium: 140 mmol/L (ref 135–146)

## 2015-05-17 LAB — LIPID PANEL
Cholesterol: 209 mg/dL — ABNORMAL HIGH (ref 125–200)
HDL: 51 mg/dL (ref >=46)
LDL Cholesterol: 131 mg/dL — ABNORMAL HIGH (ref <130)
Total CHOL/HDL Ratio: 4.1 Ratio (ref <=5.0)
Triglycerides: 133 mg/dL (ref <150)
VLDL: 27 mg/dL (ref <30)

## 2015-05-19 ENCOUNTER — Ambulatory Visit (INDEPENDENT_AMBULATORY_CARE_PROVIDER_SITE_OTHER): Payer: Commercial Managed Care - HMO

## 2015-05-19 DIAGNOSIS — Z23 Encounter for immunization: Secondary | ICD-10-CM

## 2015-05-23 ENCOUNTER — Telehealth: Payer: Self-pay | Admitting: Family Medicine

## 2015-05-23 MED ORDER — ATORVASTATIN CALCIUM 40 MG PO TABS: 40.0000 mg | ORAL_TABLET | Freq: Every day | ORAL | Status: DC

## 2015-05-23 NOTE — Telephone Encounter (Signed)
Called patient to discuss labs. Elevated cholesterol with ASCVD risk >14%. Recommend lipitor 40mg  daily. Patient agreeable to starting this medicine. Counseled on risk of muscle aches, to let us know if she develops any issues with the medicine. Note elevated CK previously in the past which subsequently normalized, low threshold to repeat if develops any symptoms of statin myopathy.  Leeanne Rio, MD

## 2015-06-12 ENCOUNTER — Ambulatory Visit (INDEPENDENT_AMBULATORY_CARE_PROVIDER_SITE_OTHER): Payer: Commercial Managed Care - HMO | Admitting: Neurology

## 2015-06-12 ENCOUNTER — Encounter: Payer: Self-pay | Admitting: Neurology

## 2015-06-12 VITALS — BP 152/84 | HR 76 | Resp 18 | Ht 65.0 in | Wt 273.0 lb

## 2015-06-12 DIAGNOSIS — R51 Headache: Secondary | ICD-10-CM | POA: Diagnosis not present

## 2015-06-12 DIAGNOSIS — G4734 Idiopathic sleep related nonobstructive alveolar hypoventilation: Secondary | ICD-10-CM

## 2015-06-12 DIAGNOSIS — R519 Headache, unspecified: Secondary | ICD-10-CM

## 2015-06-12 DIAGNOSIS — G4733 Obstructive sleep apnea (adult) (pediatric): Secondary | ICD-10-CM

## 2015-06-12 MED ORDER — AMITRIPTYLINE HCL 25 MG PO TABS
50.0000 mg | ORAL_TABLET | Freq: Every day | ORAL | Status: DC
Start: 2015-06-12 — End: 2015-11-07

## 2015-06-12 NOTE — Patient Instructions (Addendum)
Let's get you back in for a CPAP titration sleep study to treat your sleep apnea. I think, this will help you rest better, and have less headaches and be less tired in the morning.  I will get you re-started on amitriptyline, as you have done well with that.  I will see you back after your sleep study.  Please get your eye check done as well as it has been years.

## 2015-06-12 NOTE — Progress Notes (Signed)
Subjective:    Patient ID: [name] is a 69 y.o. female.  HPI     Interim history:   [name] is a very pleasant 69 year old right-handed woman with an underlying complex medical history of chronic kidney disease, recurrent thrombophlebitis and recurrent PE (previously on coumadin, now on Xarelto), hyperlipidemia, anemia, gout and OA, s/p b/l THRs, who presents for followup consultation of her HAs and episodic arm numbness and weakness. She is unaccompanied today. I last saw on 07/04/2014, at which time she requested a sooner than scheduled appointment because of recurrent headaches. She reported a throbbing b/l fronto-temporal HA, no N/V, but some photophobia. Reported more stress. She had nocturia on average 3 times per night. She was not sleeping well. She had no further numbness or weakness episodes. Headaches with frequent, almost every other day or so. She had not had an eye exam in about 2 years. She did admit that she was not always drinking enough water. I suggested we proceed with a sleep study based on risk factors for obstructive sleep apnea. She had a baseline sleep study on 08/21/2014 and I went over her test results with her in detail today. Her sleep efficiency was 79.7% with a latency to sleep of 17.5 minutes and wake after sleep onset of 85.5 minutes with moderate sleep fragmentation noted. She had an arousal index of 7.7 per hour, increased percentage of stage II sleep at 81.5%, absence of slow-wave sleep and a decreased percentage of REM sleep at 9.5% with a highly prolonged REM latency of 340 minutes. She had moderate PLMS with an index of 39.3 per hour, and an associated arousal index of 3.7 per hour. She had no significant EKG changes. She had mild to moderate and loud snoring. She had a total AHI of 12 per hour, rising to 68.6 per hour during REM sleep. Average oxygen saturation was 92%, nadir was 75%. Based on her test results I invited her back for a CPAP titration study.  She rescheduled this.   Today, 06/12/2015: She reports doing well, with the exception of stress. She had some deaths in the family. Headaches are stable when on Elavil. She needs a refill on the amitriptyline. She has not been sleeping well. She ran out of the Elavil some 2 weeks ago. She does better with it when she was on it.     Previously:  I saw her on 04/12/2014, at which time she reported doing well. He was taking amitriptyline. She had arthritis in the knees. As amitriptyline was helpful I suggested she continue with amitriptyline 50 mg each night.  I saw her on 10/07/2013, at which time we talked about all her test results she had blood work through her primary care provider on 01/10/2014 which I reviewed: She had stable findings as outlined below, and she reported that amitriptyline had been helpful. She had had an elevation in CK level in the past but recheck recently was normal. She saw NSG and there was no intervention needed. 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 She ran out of elavil and had recurrence of HAs. She had noted no SEs. She was taking 50 mg each night. She stopped the Lipitor d/t GER and her HCTZ was stopped. In the interim, she called on 10/15/2013 reporting that her insurance was not covering her amitriptyline but she wanted to continue using it and pay for it out of pocket as  I understand.  I first met her on 02/26/2013, at which time she presented with a two-month history of intermittent episodes of weakness affecting her arms as well as numbness associated. This lasted for about 3-5 minutes at a time. She has also had recurrent headaches with associated nausea, but no vomiting and has had a throbbing, bifrontal HA, and the HA last about 3 hours. She had two episodes while driving. At the time of her first visit with me I felt the differential diagnoses included migraines with neurological manifestation, cervical  radiculopathy and her exam was nonfocal. I suggested blood work as well as an EEG, EMG and nerve conduction studies as well as a cervical spine MRI. Symptomatically for her recurrent headaches I tried her on low dose amitriptyline.   Her EEG from 03/04/13 showed: This is a normal EEG recording in the waking and sleeping state. No evidence of ictal or interictal discharges were seen at any time during the recording.   Her EMG and nerve conduction studies from 03/10/2013 showed: Nerve conduction studies done on both upper extremities shows evidence of bilateral carpal tunnel syndrome of moderate severity on the right, mild severity on the left. EMG evaluations of both upper extremities show similar findings consistent with bilateral chronic stable C8 radiculopathies. No other significant abnormalities were seen.   She had CTS surgery on the R.   MRI cervical spine without contrast on 03/08/2013 showed: 1. At C6-7: moderate spinal stenosis and severe bilateral foraminal stenosis; no cord signal abnormalities, 2. At C5-6, C7-T1: mild-moderate spinal stenosis and severe bilateral foraminal stenosis; no cord signal abnormalities 3. At C3-4, C4-5: mild spinal stenosis and severe bilateral foraminal stenosis; no cord signal abnormalities 4. Multi-level degenerative spondylosis and disc bulging from C3-4 down to C6-7. Reversal of normal cervical curvature at C4 and C5.   Lab results from 02/26/2013 showed normal SPEP and immunofluorescent electrophoresis, normal rheumatoid factor, normal vitamin D level, normal ANA, normal RPR, normal ESR, normal aldolase, normal CRP, normal CK-MB but elevated CK of 438. I suggested we recheck it down the [place]. Based on her imaging and EMG test results I referred her to neurosurgery.    She had an outside TIA workup which included MRI and MRA brain, echocardiogram and carotid Doppler studies which were unremarkable with less than 39% stenosis in b/l ICAs and her MRI/MRA brain from  01/27/13: Normal MRI appearance of the brain. Left sphenoid sinusitis with opacification of greater than 50%. Mild distal small vessel disease. No significant proximal stenosis, aneurysm, or branch vessel occlusion.     Her Past Medical History Is Significant For: Past Medical History  Diagnosis Date  . Hyperlipidemia   . 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 Recurrent pulmonary embolism [facility])     see pulmonologist Dr. Asencion Noble  . Obesity (BMI 30-39.9)   . DJD (degenerative joint disease) of hip   . Sprain and strain of wrist   . CKD (chronic kidney disease), stage III     pt doesn't know why this is listed  . GERD (gastroesophageal reflux disease)     h/o in the 1990s  . Heart murmur   . Hypertension     sees Dr. Gilda Crease  . Anemia     Her Past Surgical History Is Significant For: Past Surgical History  Procedure Laterality Date  . Partial hip  arthroplasty  Left, 02/2009    Dr Hal Morales  . Shoulder surgery  Left 07/15/08    Dr Hal Morales  . Femoral vein ligation  12/2002  . [place] filter  11/03/2003    R Jugular  . Total hip arthroplasty  R 05/2203, L 2010  . Tubal ligation  1981  . Transthoracic echocardiogram      EF 55-60%, RV mod dilated, mildly increased LV wall thickness  . Dilation and curettage of uterus  02/21/2012    Procedure: DILATATION AND CURETTAGE;  Surgeon: Emily Filbert, MD;  Location: [facility];  Service: Gynecology;  Laterality: N/A;  . Carpal tunnel release  09/02/2012    Procedure: CARPAL TUNNEL RELEASE;  Surgeon: Winfield Cunas, MD;  Location: [facility];  Service: Neurosurgery;  Laterality: Right;  RIGHT carpal tunnel release    Her Family History Is Significant For: Family History  Problem Relation Age of Onset  . Heart disease Mother   . Heart disease Father   . Cancer Brother   . Cancer Sister      Her Social History Is Significant For: Social History   Social History  . Marital Status: Widowed    Spouse Name: N/A  . Number of Children: 2  . Years of Education: College   Occupational History  .     Social History Main Topics  . Smoking status: Never Smoker   . Smokeless tobacco: Never Used  . Alcohol Use: No  . Drug Use: No  . Sexual Activity: Not Currently    Birth Control/ Protection: None   Other Topics Concern  . 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    Lives at home with  daughter born in '87.    Non smoker. Non alcohol drinker.;    Fosters children .             Her Allergies Are:  No Known Allergies:   Her Current Medications Are:  Outpatient Encounter Prescriptions as of 06/12/2015  Medication Sig  . amitriptyline (ELAVIL) 25 MG tablet Take 2 tablets (50 mg total) by mouth at bedtime.  Marland Kitchen amitriptyline (ELAVIL) 25 MG tablet TAKE 2 TABLETS BY MOUTH AT BEDTIME  . atorvastatin (LIPITOR) 40 MG tablet Take 1 tablet (40 mg total) by mouth daily.  . ferrous sulfate 325 (65 FE) MG tablet TAKE 1 TABLET BY MOUTH TWICE A DAY  . furosemide (LASIX) 20 MG tablet Take 20 mg by mouth every other day.   . metoprolol succinate (TOPROL-XL) 50 MG 24 hr tablet Take 50 mg by mouth at bedtime. Take with or immediately following a meal.  . omeprazole (PRILOSEC) 20 MG capsule TAKE ONE CAPSULE BY MOUTH EVERY DAY  . rivaroxaban (XARELTO) 20 MG TABS tablet Take 1 tablet (20 mg total) by mouth daily.  Marland Kitchen spironolactone (ALDACTONE) 25 MG tablet TAKE 1 TABLET BY MOUTH EVERY DAY  . triamcinolone (KENALOG) 0.025 % ointment Apply 1 application topically 2 (two) times daily. As needed to affected area  . verapamil (CALAN-SR) 240 MG CR tablet TAKE 1 TABLET BY MOUTH AT BEDTIME   No facility-administered encounter medications on file as of 06/12/2015.  :  Review of Systems:  Out of a complete 14 point  review of systems, all are reviewed and negative with the exception of these symptoms as listed below:   Review of Systems  Neurological:  Patient is here to discuss sleep study.     Objective:  Neurologic Exam  Physical Exam Physical Examination:   Filed Vitals:   06/12/15 0907  BP: 152/84  Pulse: 76  Resp: 18    General Examination: The patient is a very pleasant 70 y.o. female in no acute distress. She appears well-developed and well-nourished and well groomed. She is in good spirits today.  HEENT: Normocephalic, atraumatic, pupils are equal, round and reactive to light and accommodation. Funduscopic exam is normal with sharp disc margins noted. Mild b/l cataracts are noted. Extraocular tracking is good without limitation to gaze excursion or nystagmus noted. Normal smooth pursuit is noted. Hearing is grossly intact. Face is symmetric with normal facial animation and normal facial sensation. Speech is clear with no dysarthria noted. There is no hypophonia. There is no lip, neck/head, jaw or voice tremor. Neck is supple with full range of passive and active motion. There are no carotid bruits on auscultation. Oropharynx exam reveals: mild mouth dryness, adequate dental hygiene and moderate airway crowding, due to larger tongue and redundant soft palate. Mallampati is class II. Tongue protrudes centrally and palate elevates symmetrically.  Chest: Clear to auscultation without wheezing, rhonchi or crackles noted.  Heart: S1+S2+0, regular and normal without murmurs, rubs or gallops noted.   Abdomen: Soft, non-tender and non-distended with normal bowel sounds appreciated on auscultation.  Extremities: There is 1 to 2 + pitting edema in the distal lower extremities bilaterally.   Skin: Warm and dry without trophic changes noted. There are no varicose veins, but she has significant discoloration to her legs from prior phlebitis, unchanged.  Musculoskeletal: exam reveals no obvious  joint deformities, tenderness or joint swelling or erythema, with the exception of b/l knee pain.   Neurologically:  Mental status: The patient is awake, alert and oriented in all 4 spheres. Her memory, attention, language and knowledge are appropriate. There is no aphasia, agnosia, apraxia or anomia. Speech is clear with normal prosody and enunciation. Thought process is linear. Mood is congruent and affect is normal.  Cranial nerves are as described above under HEENT exam. In addition, shoulder shrug is normal with equal shoulder height noted. Motor exam: Normal bulk, strength and tone is noted. There is no drift, tremor or rebound, no atrophy or fasciculations. Romberg is negative. Reflexes are 1+ in the UEs and diminished in the LEs. Fine motor skills are intact with normal finger taps, normal hand movements, normal rapid alternating patting, normal foot taps and normal foot agility.  Cerebellar testing shows no dysmetria or intention tremor on finger to nose testing. There is no truncal or gait ataxia.  Sensory exam is intact to light touch in the upper and lower extremities.  Gait, station and balance: She has trouble standing and walks with a limp, almost a waddle. Posture is moderately stooped with a significant kink in the lower back, unchanged. No veering to one side is noted. No leaning to one side is noted. Posture is age-appropriate and stance is wide based. No problems turning are noted. She turns en bloc. Tandem walk is not possible for her.              Assessment and plan:   In summary, [name] is a very pleasant 69 year old female with an underlying complex medical history of chronic kidney disease, recurrent thrombophlebitis and recurrent PE (previously on coumadin, now on Xarelto), hyperlipidemia, anemia, gout and OA, s/p b/l THRs, who presents for followup consultation of her  HAs. She most likely has tension type headaches, and triggers include poor sleep, not eating well,  not drinking enough water and most significantly, stress as well as untreated obstructive sleep apnea. Her baseline sleep study from 08/21/2014 confirmed her diagnosis of OSA with a total AHI of 12 per hour, severe during REM sleep with significant desaturations during REM sleep with a nadir of 75% and time below 88% saturation of over 45 minutes. For this, I suggested that she return for CPAP therapy with a full night titration. Her exam is stable, but she is advised to have her eyes checked, as it has been years. She reports ongoing good results with amitriptyline, Elavil, which she is tolerating well at this time. W/u was in keeping with degenerative spine disease and CTS and recurrent HAs. She had CK elevation in the past which upon recheck was normal. She has had an extensive vascular and neurophysiological w/u in the past. Symptomatically, for her headaches I would like to continue her on Elavil. She is advised about potential HAs triggers today and given her nocturia, poor quality sleep, obesity and airway tightness and OSA, I would like to proceed with a CPAP study. I will see her back after the sleep study. She was in agreement.  I spent 25 minutes in total face-to-face time with the patient, more than 50% of which was spent in counseling and coordination of care, reviewing test results, reviewing medication and discussing or reviewing the diagnosis of OSA and recurrent headaches, the prognosis and treatment options.

## 2015-06-13 ENCOUNTER — Other Ambulatory Visit: Payer: Self-pay | Admitting: Family Medicine

## 2015-09-18 ENCOUNTER — Other Ambulatory Visit: Payer: Self-pay | Admitting: *Deleted

## 2015-09-18 MED ORDER — OMEPRAZOLE 20 MG PO CPDR: 20.0000 mg | DELAYED_RELEASE_CAPSULE | Freq: Every day | ORAL | Status: DC

## 2015-09-18 MED ORDER — VERAPAMIL HCL ER 240 MG PO TBCR: 240.0000 mg | EXTENDED_RELEASE_TABLET | Freq: Every day | ORAL | Status: DC

## 2015-09-18 MED ORDER — SPIRONOLACTONE 25 MG PO TABS: 25.0000 mg | ORAL_TABLET | Freq: Every day | ORAL | Status: DC

## 2015-10-02 ENCOUNTER — Ambulatory Visit (INDEPENDENT_AMBULATORY_CARE_PROVIDER_SITE_OTHER): Payer: Commercial Managed Care - HMO | Admitting: Neurology

## 2015-10-02 DIAGNOSIS — G479 Sleep disorder, unspecified: Secondary | ICD-10-CM

## 2015-10-02 DIAGNOSIS — G4733 Obstructive sleep apnea (adult) (pediatric): Secondary | ICD-10-CM | POA: Diagnosis not present

## 2015-10-02 DIAGNOSIS — G4761 Periodic limb movement disorder: Secondary | ICD-10-CM

## 2015-10-02 DIAGNOSIS — G4720 Circadian rhythm sleep disorder, unspecified type: Secondary | ICD-10-CM

## 2015-10-02 NOTE — Sleep Study (Signed)
Please see the scanned sleep study interpretation located in the Procedure tab within the Chart Review section. 

## 2015-10-03 ENCOUNTER — Telehealth: Payer: Self-pay | Admitting: Neurology

## 2015-10-03 DIAGNOSIS — G4733 Obstructive sleep apnea (adult) (pediatric): Secondary | ICD-10-CM

## 2015-10-03 NOTE — Telephone Encounter (Signed)
PSG on 08/21/14, cpap study on 10/02/15, Ins: [employer] MCR. Patient last seen by me on 06/12/15. Please call and inform patient that I have entered an order for treatment with positive airway pressure (PAP) treatment of obstructive sleep apnea (OSA). She did well during the latest sleep study with CPAP. We will, therefore, arrange for a machine for home use through a DME (durable medical equipment) company of Her choice; and I will see the patient back in follow-up in about 8-10 weeks. Please also explain to the patient that I will be looking out for compliance data, which can be downloaded from the machine (stored on an SD card, that is inserted in the machine) or via remote access through a modem, that is built into the machine. At the time of the followup appointment we will discuss sleep study results and how it is going with PAP treatment at home. Please advise patient to bring Her machine at the time of the first FU visit, even though this is cumbersome. Bringing the machine for every visit after that will likely not be needed, but often helps for the first visit to troubleshoot if needed. Please re-enforce the importance of compliance with treatment and the need for [place] to monitor compliance data - often an insurance requirement and actually good feedback for the patient as far as how they are doing.  Also remind patient, that any interim PAP machine or mask issues should be first addressed with the DME company, as they can often help better with technical and mask fit issues. Please ask if patient has a preference regarding DME company.  Please also make sure, the patient has a follow-up appointment with me in about 8-10 weeks from the setup date, thanks.  Once you have spoken to the patient - and faxed/routed report to PCP and referring MD (if other than PCP), you can close this encounter, thanks,   Star Age, MD, PhD [facility] ([place])

## 2015-10-04 NOTE — Telephone Encounter (Signed)
I spoke to patient and she is aware of results and recommendations. She is willing to start treatment. I have sent order to [facility]. I will send patient a letter reminding her to make f/u appt and stress the importance of compliance.

## 2015-11-07 ENCOUNTER — Telehealth: Payer: Self-pay

## 2015-11-07 DIAGNOSIS — R519 Headache, unspecified: Secondary | ICD-10-CM

## 2015-11-07 DIAGNOSIS — R51 Headache: Principal | ICD-10-CM

## 2015-11-07 MED ORDER — AMITRIPTYLINE HCL 25 MG PO TABS: 50.0000 mg | ORAL_TABLET | Freq: Every day | ORAL | Status: DC

## 2015-11-07 NOTE — Telephone Encounter (Signed)
90 day Rx request sent to [place] from pharmacy.

## 2015-12-18 ENCOUNTER — Ambulatory Visit (HOSPITAL_COMMUNITY)
Admission: RE | Admit: 2015-12-18 | Discharge: 2015-12-18 | Disposition: A | Discharge status: Home / Self Care | Payer: Commercial Managed Care - HMO | Source: Ambulatory Visit | Attending: Family Medicine | Admitting: Family Medicine

## 2015-12-18 ENCOUNTER — Encounter: Payer: Self-pay | Admitting: Family Medicine

## 2015-12-18 ENCOUNTER — Ambulatory Visit (INDEPENDENT_AMBULATORY_CARE_PROVIDER_SITE_OTHER): Payer: Commercial Managed Care - HMO | Admitting: Family Medicine

## 2015-12-18 VITALS — BP 152/78 | HR 99 | Temp 98.4°F | Ht 65.0 in | Wt 271.4 lb

## 2015-12-18 DIAGNOSIS — D509 Iron deficiency anemia, unspecified: Secondary | ICD-10-CM | POA: Diagnosis not present

## 2015-12-18 DIAGNOSIS — R9431 Abnormal electrocardiogram [ECG] [EKG]: Secondary | ICD-10-CM | POA: Insufficient documentation

## 2015-12-18 DIAGNOSIS — I10 Essential (primary) hypertension: Secondary | ICD-10-CM

## 2015-12-18 DIAGNOSIS — E785 Hyperlipidemia, unspecified: Secondary | ICD-10-CM

## 2015-12-18 DIAGNOSIS — R000 Tachycardia, unspecified: Secondary | ICD-10-CM | POA: Insufficient documentation

## 2015-12-18 DIAGNOSIS — I8312 Varicose veins of left lower extremity with inflammation: Secondary | ICD-10-CM

## 2015-12-18 DIAGNOSIS — I4510 Unspecified right bundle-branch block: Secondary | ICD-10-CM | POA: Insufficient documentation

## 2015-12-18 DIAGNOSIS — I872 Venous insufficiency (chronic) (peripheral): Secondary | ICD-10-CM

## 2015-12-18 DIAGNOSIS — D649 Anemia, unspecified: Secondary | ICD-10-CM

## 2015-12-18 DIAGNOSIS — I8311 Varicose veins of right lower extremity with inflammation: Secondary | ICD-10-CM

## 2015-12-18 LAB — BASIC METABOLIC PANEL
BUN: 19 mg/dL (ref 7–25)
CO2: 26 mmol/L (ref 20–31)
Calcium: 9.5 mg/dL (ref 8.6–10.4)
Chloride: 99 mmol/L (ref 98–110)
Creat: 1.26 mg/dL — ABNORMAL HIGH (ref 0.50–0.99)
Glucose, Bld: 87 mg/dL (ref 65–99)
Potassium: 3.9 mmol/L (ref 3.5–5.3)
Sodium: 141 mmol/L (ref 135–146)

## 2015-12-18 LAB — POCT HEMOGLOBIN: Hemoglobin: 13.1 g/dL (ref 12.2–16.2)

## 2015-12-18 MED ORDER — METOPROLOL SUCCINATE ER 100 MG PO TB24: 100.0000 mg | ORAL_TABLET | Freq: Every day | ORAL | Status: DC

## 2015-12-18 NOTE — Progress Notes (Signed)
Date of Visit: 12/18/2015   HPI:  [name] presents for routine follow up.  Overall she's doing very well. Her only complaint today is that she has some left sided chest pain when she sleeps on her left side. No other chest pain, including during exertion. No shortness of breath. She wonders if it is related to her IVC filter.   Hypertension - currently taking metoprolol XL 50mg  daily, lasix 20mg  every other day, spironolactone 25mg  daily, and verapamil capillary refill 240mg  daily.  Hyperlipidemia - tolerating lipitor 40mg  daily  Stasis dermatitis - needs refill on triamcinolone cream, previously prescribed by her pulmonologist  Anemia - no longer on iron therapy. Due for recheck of hgb.   ROS: See HPI.  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   ASSESSMENT/PLAN:  ANEMIA, OTHER, UNSPECIFIED Recheck Hgb today  HYPERTENSION, BENIGN SYSTEMIC Blood pressure elevated initially to 202/100, improved to 152/78 after several rechecks Initially with some concern, as her pulse is 99 despite being on both  Metoprolol and verapamil - raises the question as to why pulse is not adequately suppressed. EKG performed and is NSR, with ventricular rate of 82. No signs of ischemia. Suspect chest pain is musculoskeletal in origin. Reassured by normal ventricular rate. Will cautiously increase her metoprolol XL to 100mg  at night. Continue current verapamil  dose Counseled her to let us know if she develops any palpitations, dizziness, or irregular heartbeat.  Follow up in 2 weeks for RN BP check.  Hyperlipidemia Tolerating lipitor. Due for lipids in 3 months.  Venous stasis dermatitis Refill triamcinolone   FOLLOW UP: Follow up in 2 weeks for RN BP check  [name] J. Ardelia Mems, [place]

## 2015-12-18 NOTE — Patient Instructions (Addendum)
It was great to see you again today!  Rechecking your hemoglobin to be sure it's fine off of iron I doubt the left sided discomfort is related to the filter  Let's go up on your metoprolol to 100mg  at night Follow up in 2 weeks for a nurse blood pressure check to be sure blood pressure is better  If you have any irregular heartbeat or fluttering please call us immediately  Be well, Dr. Ardelia Mems

## 2015-12-21 ENCOUNTER — Ambulatory Visit: Payer: Commercial Managed Care - HMO | Admitting: Pulmonary Disease

## 2015-12-22 NOTE — Assessment & Plan Note (Signed)
Recheck Hgb today

## 2015-12-22 NOTE — Assessment & Plan Note (Signed)
Tolerating lipitor. Due for lipids in 3 months.

## 2015-12-22 NOTE — Assessment & Plan Note (Signed)
Refill triamcinolone

## 2015-12-22 NOTE — Assessment & Plan Note (Signed)
Blood pressure elevated initially to 202/100, improved to 152/78 after several rechecks Initially with some concern, as her pulse is 99 despite being on both  Metoprolol and verapamil - raises the question as to why pulse is not adequately suppressed. EKG performed and is NSR, with ventricular rate of 82. No signs of ischemia. Suspect chest pain is musculoskeletal in origin. Reassured by normal ventricular rate. Will cautiously increase her metoprolol XL to 100mg  at night. Continue current verapamil dose Counseled her to let us know if she develops any palpitations, dizziness, or irregular heartbeat.  Follow up in 2 weeks for RN BP check.

## 2015-12-25 ENCOUNTER — Encounter: Payer: Self-pay | Admitting: Family Medicine

## 2016-01-02 ENCOUNTER — Ambulatory Visit (INDEPENDENT_AMBULATORY_CARE_PROVIDER_SITE_OTHER): Payer: Commercial Managed Care - HMO | Admitting: Family Medicine

## 2016-01-02 ENCOUNTER — Encounter: Payer: Self-pay | Admitting: Family Medicine

## 2016-01-02 VITALS — BP 113/70 | HR 73 | Temp 98.1°F | Ht 65.0 in | Wt 267.4 lb

## 2016-01-02 DIAGNOSIS — I10 Essential (primary) hypertension: Secondary | ICD-10-CM | POA: Diagnosis not present

## 2016-01-02 NOTE — Patient Instructions (Signed)
Blood pressure and pulse look great Continue current medications  Follow up in 3 months, sooner if any issues  Be well, Dr. Ardelia Mems

## 2016-01-02 NOTE — Progress Notes (Signed)
Date of Visit: 01/02/2016   HPI:  Patient presents to follow up on blood pressure and pulse after recent medication adjustment. At last visit went up on metoprolol succinate to 100mg  daily.   Patient reports she's done well since then. Denies chest pain, shortness of breath, ligthheadedness, dizziness, palpitations. No other issues today. Feels well.   ROS: See HPI.  [place]: history of hyperlipidemia, anemia, hypertension, VTE on chronic anticoagulation, CKD, GERD  PHYSICAL EXAM: BP 113/70 mmHg  Pulse 73  Temp(Src) 98.1 F (36.7 C) (Oral)  Ht 5\' 5"  (1.651 m)  Wt 267 lb 6.4 oz (121.292 kg)  BMI 44.50 kg/m2 Gen: NAD, pleasant, cooperative HEENT: normocephalic, atraumatic, moist mucous membranes  Heart: regular rate and rhythm, no murmur Lungs: clear to auscultation bilaterally, normal work of breathing  Neuro: alert, grossly nonfocal, speech normal Ext: minimal lower extremity edema bilaterally  ASSESSMENT/PLAN:  HYPERTENSION, BENIGN SYSTEMIC Now well controlled blood pressure and pulse. Tolerating increased dose of metoprolol. Continue current regimen. Follow up in 3 months, sooner if needed.   FOLLOW UP: Follow up in 3 months for chronic medical problems, sooner if needed  [place] J. Ardelia Mems, [place]

## 2016-01-02 NOTE — Assessment & Plan Note (Signed)
Now well controlled blood pressure and pulse. Tolerating increased dose of metoprolol. Continue current regimen. Follow up in 3 months, sooner if needed.

## 2016-01-11 ENCOUNTER — Telehealth: Payer: Self-pay | Admitting: Pulmonary Disease

## 2016-01-11 MED ORDER — RIVAROXABAN 20 MG PO TABS: 20.0000 mg | ORAL_TABLET | Freq: Every day | ORAL | Status: DC

## 2016-01-11 NOTE — Telephone Encounter (Signed)
We have not seen pt in over 1 year. Pt will need an appointment. Spoke with her, she has been scheduled with BQ on 02/26/2016 at 3:30pm. Rx will be sent in to last until this appointment. Nothing further was needed.

## 2016-02-26 ENCOUNTER — Encounter: Payer: Self-pay | Admitting: Pulmonary Disease

## 2016-02-26 ENCOUNTER — Ambulatory Visit (INDEPENDENT_AMBULATORY_CARE_PROVIDER_SITE_OTHER): Payer: Commercial Managed Care - HMO | Admitting: Pulmonary Disease

## 2016-02-26 VITALS — BP 134/76 | HR 82 | Ht 65.0 in | Wt 278.0 lb

## 2016-02-26 DIAGNOSIS — I8290 Acute embolism and thrombosis of unspecified vein: Secondary | ICD-10-CM

## 2016-02-26 MED ORDER — RIVAROXABAN 20 MG PO TABS: 20.0000 mg | ORAL_TABLET | Freq: Every day | ORAL | 11 refills | Status: DC

## 2016-02-26 MED ORDER — RIVAROXABAN 20 MG PO TABS: 20.0000 mg | ORAL_TABLET | Freq: Every day | ORAL | 3 refills | Status: DC

## 2016-02-26 NOTE — Patient Instructions (Signed)
1.  Continue to take Xarelto as prescribed  2.  Be aware of new DVT symptoms - increasing shortness of breath, pain / swelling in lower extremity, chest pain or pain with inspiration  3.  Call if new blood in stool, coughing up blood, or bleeding you can not stop.   4.  Follow up in one year with Dr. [place] Bells for PE / DVT follow up

## 2016-02-26 NOTE — Progress Notes (Signed)
[facility]   Chief Complaint  Patient presents with  . Follow-up    former PW pt being treated for PE- pt c/o sob with exertion.       Current Outpatient Prescriptions on File Prior to Visit  Medication Sig  . amitriptyline (ELAVIL) 25 MG tablet TAKE 2 TABLETS BY MOUTH AT BEDTIME  . amitriptyline (ELAVIL) 25 MG tablet Take 2 tablets (50 mg total) by mouth at bedtime.  Marland Kitchen atorvastatin (LIPITOR) 40 MG tablet Take 1 tablet (40 mg total) by mouth daily.  . ferrous sulfate 325 (65 FE) MG tablet TAKE 1 TABLET BY MOUTH TWICE A DAY  . furosemide (LASIX) 20 MG tablet Take 20 mg by mouth every other day.   . metoprolol succinate (TOPROL-XL) 100 MG 24 hr tablet Take 1 tablet (100 mg total) by mouth at bedtime. Take with or immediately following a meal.  . omeprazole (PRILOSEC) 20 MG capsule Take 1 capsule (20 mg total) by mouth daily.  Marland Kitchen spironolactone (ALDACTONE) 25 MG tablet Take 1 tablet (25 mg total) by mouth daily.  Marland Kitchen triamcinolone (KENALOG) 0.025 % ointment Apply 1 application topically 2 (two) times daily. As needed to affected area  . verapamil (CALAN-SR) 240 MG CR tablet Take 1 tablet (240 mg total) by mouth at bedtime.   No current facility-administered medications on file prior to visit.      Studies: 2/27 PSG >> score at 4%, severe REM related OSA, desats to 75% on room air.  OSA followed by Dr. 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   Past Surgical hx, Allergies, Family hx, Social hx all reviewed.  Vital Signs BP 134/76 (BP Location: Left Arm, Cuff Size: Large)   Pulse 82   Ht 5\' 5"  (1.651 m)   Wt 278 lb (126.1 kg)   SpO2 95%    BMI 46.26 kg/m   History of Present Illness [name] is a 70 y.o. female with a history of morbid obesity, walks with a cane, disabled, CKD, HTN, OSA on CPAP (followed by [facility] / Dr. 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 Joya Gaskins, who presented to the pulmonary office for annual follow up of PE/DVT.  Prior history of at least one critical illness surrounding PE episodes, >8 day ICU stay.    Recent labs 12/18/2015 >> Na 141, K 3.9, Sr Cr 1.26 (baseline 1.1-1.4), WBC 11.1, Hgb 13.7.  Last LLFT's in 5/206 AST 15 / ALT 8.    Patient returns 7/24 for annual visit for PE/DVT.  Patient reports she has been on Xarelto for 2 years and has not had difficulty with taking the medication.  She previously was on Coumadin for approximately 20 years and she is very happy to have made the switch.  She denies issues with medication.  Denies worsening shortness of breath, cough, sputum production, fevers, chills, increased lower extremity swelling, chest pain, pain with inspiration, no blood in stool, no hemoptysis.  She does report mild chronic dyspnea but is unchanged from her baseline.  She notices it with long extended periods of physical exertion, resolves with rest.      Physical Exam  General - well developed, obese, adult F in no acute distress ENT - No sinus tenderness, no oral exudate, no LAN Cardiac - s1s2 regular, no murmur Chest - even/non-labored, lungs bilaterally clear. No wheeze/rales Back - No focal tenderness Abd - Soft, non-tender Ext - trace generalized lower extremity edema  Neuro - Normal strength Skin - No rashes Psych - normal mood, and behavior   Assessment/Plan  Recurrent DVT / PE - on lifelong anticoagulation, s/p IVC filter.  First PE in 1980, on coumadin for approx 20 years.  Has been on Xarelto since 2015 and tolerated well.  No new symptoms.  ECHO 01/2014 with normal RA & PA   Plan:   Refill provided  for Xarelto 20 mg QD, #11 refills.  Educated patient on symptoms of DVT Discussed monitoring for hemoptysis, melena, hematochezia with patient Follow up in one year with Dr. Lake Bells or sooner if new needs arise   OSA on CPAP- followed by [facility], recent PSG as above  Plan: Follow up with [facility] as previously arranged Encourage use of CPAP / compliance  Patient Instructions  1.  Continue to take Xarelto as prescribed  2.  Be aware of new DVT symptoms - increasing shortness of breath, pain / swelling in lower extremity, chest pain or pain with inspiration  3.  Call if new blood in stool, coughing up blood, or bleeding you can not stop.   4.  Follow up in one year with Dr. Lake Bells for PE / DVT follow up     Noe Gens, NP-C [place]  [phone] 02/26/2016, 4:16 PM  Attending:  I have seen and examined the patient with nurse practitioner/resident and agree with the note above.  We formulated the plan together and I elicited the following history.    Recurrent PE, stable no bleeding  On exam Lungs clear, normal effort CV: RRR, no mgr  Recurrent PE> continue lifelong anticoagulation. Today we talked about bleeding risk of Xarelto.  She much prefers this agent to warfarin, though I explained that all anticoagulants carry a risk of bleeding and there is currently no commercially available antidote to Xarelto.  She still wishes to remain on that medicine.  Roselie Awkward, MD [facility] Pager: [phone] Cell: [phone] After 3pm or if no response, call [phone]

## 2016-04-03 ENCOUNTER — Other Ambulatory Visit: Payer: Self-pay | Admitting: *Deleted

## 2016-04-03 NOTE — Telephone Encounter (Signed)
Refill request for 90 day supply.  Martin, Tamika L, RN  

## 2016-04-04 MED ORDER — VERAPAMIL HCL ER 240 MG PO TBCR: 240.0000 mg | EXTENDED_RELEASE_TABLET | Freq: Every day | ORAL | 2 refills | Status: DC

## 2016-04-04 MED ORDER — OMEPRAZOLE 20 MG PO CPDR
20.0000 mg | DELAYED_RELEASE_CAPSULE | Freq: Every day | ORAL | 2 refills | Status: DC
Start: 2016-04-04 — End: 2017-01-29

## 2016-05-24 DIAGNOSIS — M109 Gout, unspecified: Secondary | ICD-10-CM | POA: Diagnosis not present

## 2016-05-27 ENCOUNTER — Ambulatory Visit: Payer: Commercial Managed Care - HMO | Admitting: Family Medicine

## 2016-06-24 ENCOUNTER — Ambulatory Visit: Payer: Commercial Managed Care - HMO | Admitting: Family Medicine

## 2016-06-29 ENCOUNTER — Other Ambulatory Visit: Payer: Self-pay | Admitting: Family Medicine

## 2016-08-19 ENCOUNTER — Other Ambulatory Visit: Payer: Self-pay | Admitting: Pulmonary Disease

## 2016-09-03 ENCOUNTER — Encounter: Payer: Self-pay | Admitting: Family Medicine

## 2016-09-03 ENCOUNTER — Ambulatory Visit (INDEPENDENT_AMBULATORY_CARE_PROVIDER_SITE_OTHER): Payer: Medicare HMO | Admitting: Family Medicine

## 2016-09-03 VITALS — BP 156/82 | HR 103 | Temp 98.4°F | Ht 65.0 in | Wt 264.2 lb

## 2016-09-03 DIAGNOSIS — J069 Acute upper respiratory infection, unspecified: Secondary | ICD-10-CM

## 2016-09-03 MED ORDER — GUAIFENESIN-DM 100-10 MG/5ML PO SYRP: 5.0000 mL | ORAL_SOLUTION | Freq: Every day | ORAL | 0 refills | Status: DC

## 2016-09-03 NOTE — Patient Instructions (Signed)
Thank you for coming in today, it was so nice to see you! Today we talked about:    Fever, runny nose, cough: Her lungs sound clear. I think that this is likely a cold or flu virus. Please continue to get plenty of rest, drink plenty of fluids, drink warm tea with honey.  He can take the medicine I prescribed at nighttime before bed to help with cough  Recent to go to hospital: You have trouble breathing, wheezing, continued to have fever that is not resolved with Tylenol  Come back in 1 week if you're not feeling better   If you have any questions or concerns, please do not hesitate to call the office at [phone]. You can also message me directly via MyChart.   Sincerely,  Smitty Cords, MD    Upper Respiratory Infection, Adult Most upper respiratory infections (URIs) are caused by a virus. A URI affects the nose, throat, and upper air passages. The most common type of URI is often called "the common cold." Follow these instructions at home:  Take medicines only as told by your doctor.  Gargle warm saltwater or take cough drops to comfort your throat as told by your doctor.  Use a warm mist humidifier or inhale steam from a shower to increase air moisture. This may make it easier to breathe.  Drink enough fluid to keep your pee (urine) clear or pale yellow.  Eat soups and other clear broths.  Have a healthy diet.  Rest as needed.  Go back to work when your fever is gone or your doctor says it is okay.  You may need to stay home longer to avoid giving your URI to others.  You can also wear a face mask and wash your hands often to prevent spread of the virus.  Use your inhaler more if you have asthma.  Do not use any tobacco products, including cigarettes, chewing tobacco, or electronic cigarettes. If you need help quitting, ask your doctor. Contact a doctor if:  You are getting worse, not better.  Your symptoms are not helped by medicine.  You have  chills.  You are getting more short of breath.  You have brown or red mucus.  You have yellow or brown discharge from your nose.  You have pain in your face, especially when you bend forward.  You have a fever.  You have puffy (swollen) neck glands.  You have pain while swallowing.  You have white areas in the back of your throat. Get help right away if:  You have very bad or constant:  Headache.  Ear pain.  Pain in your forehead, behind your eyes, and over your cheekbones (sinus pain).  Chest pain.  You have long-lasting (chronic) lung disease and any of the following:  Wheezing.  Long-lasting cough.  Coughing up blood.  A change in your usual mucus.  You have a stiff neck.  You have changes in your:  Vision.  Hearing.  Thinking.  Mood. This information is not intended to replace advice given to you by your health care provider. Make sure you discuss any questions you have with your health care provider. Document Released: 01/08/2008 Document Revised: 03/24/2016 Document Reviewed: 10/27/2013 Elsevier Interactive Patient Education  2017 [employer].

## 2016-09-03 NOTE — Progress Notes (Signed)
   Subjective:    Patient ID: [name] , female   DOB: [birth year] , 71 y.o..   MRN: [id number]  HPI  [name] is here for  Chief Complaint  Patient presents with  . Cough   URI  Has been sick for  4 days. Nasal discharge: clear bilateral Medications tried: Nothing Sick contacts: None  Symptoms Fever: Yes, subjective. Did not take temp Headache or face pain: Headache yes. No face pain Tooth pain: no Sneezing: no Scratchy throat: no Allergies: no Muscle aches: no Severe fatigue: no Stiff neck: no Shortness of breath: no Rash: no Sore throat or swollen glands: no   ROS see HPI Smoking Status noted  Social Hx:  reports that she has never smoked. She has never used smokeless tobacco.   Objective:   BP (!) 156/82   Pulse (!) 103   Temp 98.4 F (36.9 C) (Oral)   Ht 5\' 5"  (1.651 m)   Wt 264 lb 3.2 oz (119.8 kg)   SpO2 97%   BMI 43.97 kg/m  Physical Exam  Gen: NAD, alert, cooperative with exam, well-appearing HEENT:     Head: Normocephalic, atraumatic    Neck: No masses palpated. No goiter. No lymphadenopathy     Ears: External ears normal, no drainage.Tympanic membranes intact, normal light reflex bilaterally, no erythema or bulging    Eyes: PERRLA, EOMI, sclera white, normal conjunctiva    Nose: nasal turbinates moist, clear nasal discharge    Throat: moist mucus membranes, no pharyngeal erythema, no tonsillar exudate. Airway is patent Cardiac: Regular rate and rhythm, normal S1/S2, no murmur, no edema, capillary refill brisk  Respiratory: Clear to auscultation bilaterally, no wheezes, non-labored breathing Gastrointestinal: soft, non tender, non distended, bowel sounds present Skin: no rashes, normal turgor  Neurological: no gross deficits.  Psych: good insight, normal mood and affect   Assessment & Plan:  URI: Exam findings most consistent with viral URI. No signs of pneumonia.  - Symptomatic therapy: plenty of rest, fluids, warm tea with  honey, humidifier - Prescription for Robitussin given for night time cough - Return precautions discussed  Smitty Cords, MD [place], PGY-2

## 2016-10-15 ENCOUNTER — Ambulatory Visit: Payer: Medicare HMO | Admitting: *Deleted

## 2016-10-18 ENCOUNTER — Other Ambulatory Visit: Payer: Self-pay

## 2016-10-18 MED ORDER — RIVAROXABAN 20 MG PO TABS: 20.0000 mg | ORAL_TABLET | Freq: Every day | ORAL | 1 refills | Status: DC

## 2016-10-31 ENCOUNTER — Ambulatory Visit: Payer: Medicare HMO | Admitting: *Deleted

## 2016-11-04 ENCOUNTER — Other Ambulatory Visit: Payer: Self-pay | Admitting: Family Medicine

## 2016-11-28 ENCOUNTER — Ambulatory Visit: Payer: Medicare HMO | Admitting: *Deleted

## 2016-12-12 ENCOUNTER — Ambulatory Visit: Payer: Medicare HMO | Admitting: *Deleted

## 2016-12-18 ENCOUNTER — Ambulatory Visit: Payer: Medicare HMO | Admitting: *Deleted

## 2017-01-09 DIAGNOSIS — M25561 Pain in right knee: Secondary | ICD-10-CM | POA: Diagnosis not present

## 2017-01-09 DIAGNOSIS — M25562 Pain in left knee: Secondary | ICD-10-CM | POA: Diagnosis not present

## 2017-01-09 DIAGNOSIS — M170 Bilateral primary osteoarthritis of knee: Secondary | ICD-10-CM | POA: Diagnosis not present

## 2017-01-16 DIAGNOSIS — M25562 Pain in left knee: Secondary | ICD-10-CM | POA: Diagnosis not present

## 2017-01-16 DIAGNOSIS — M25561 Pain in right knee: Secondary | ICD-10-CM | POA: Diagnosis not present

## 2017-01-16 DIAGNOSIS — M170 Bilateral primary osteoarthritis of knee: Secondary | ICD-10-CM | POA: Diagnosis not present

## 2017-01-23 DIAGNOSIS — M25561 Pain in right knee: Secondary | ICD-10-CM | POA: Diagnosis not present

## 2017-01-23 DIAGNOSIS — M170 Bilateral primary osteoarthritis of knee: Secondary | ICD-10-CM | POA: Diagnosis not present

## 2017-01-23 DIAGNOSIS — M25562 Pain in left knee: Secondary | ICD-10-CM | POA: Diagnosis not present

## 2017-01-29 ENCOUNTER — Other Ambulatory Visit: Payer: Self-pay | Admitting: Neurology

## 2017-01-29 ENCOUNTER — Other Ambulatory Visit: Payer: Self-pay | Admitting: Family Medicine

## 2017-01-29 DIAGNOSIS — R51 Headache: Principal | ICD-10-CM

## 2017-01-29 DIAGNOSIS — R519 Headache, unspecified: Secondary | ICD-10-CM

## 2017-01-30 DIAGNOSIS — M25561 Pain in right knee: Secondary | ICD-10-CM | POA: Diagnosis not present

## 2017-01-30 DIAGNOSIS — M25562 Pain in left knee: Secondary | ICD-10-CM | POA: Diagnosis not present

## 2017-01-30 DIAGNOSIS — M170 Bilateral primary osteoarthritis of knee: Secondary | ICD-10-CM | POA: Diagnosis not present

## 2017-02-03 ENCOUNTER — Other Ambulatory Visit: Payer: Self-pay | Admitting: Family Medicine

## 2017-02-26 ENCOUNTER — Encounter: Payer: Self-pay | Admitting: Acute Care

## 2017-02-26 ENCOUNTER — Ambulatory Visit (INDEPENDENT_AMBULATORY_CARE_PROVIDER_SITE_OTHER): Payer: Medicare HMO | Admitting: Acute Care

## 2017-02-26 DIAGNOSIS — I8290 Acute embolism and thrombosis of unspecified vein: Secondary | ICD-10-CM

## 2017-02-26 NOTE — Assessment & Plan Note (Signed)
Stable on Xarelto 20 mg daily No evidence of recurrent PE/DVT No evidence of bleeding Plan Continue Xaralto 20 mg daily as prescribed. We will send in a new prescription ( Keep 11 months ahead) Be aware of new DVT/ PE symptoms - increasing shortness of breath, pain / swelling in lower extremity, chest pain or pain with inspiration  If you develop fatigue let us know so we can check labs..   Call if new blood in stool, coughing up blood,. Seek emergency care for bleeding that you cannot stop. Bleeding precautions reviewed   Follow up in one year with Dr. Lake Bells for PE / DVT follow up Please contact office for sooner follow up if symptoms do not improve or worsen or seek emergency care

## 2017-02-26 NOTE — Progress Notes (Signed)
History of Present Illness [name] is a 70 y.o. female never smoker with history of recurrent PE/DVT. She is on lifelong anticoagulation. Previous patient of Dr. Asencion Noble now followed by Dr. Lake Bells.   02/26/2017 1 year check up. Patient presents for her annual visit for PE/DVT status post IVC filter. She has had multiple thromboembolic events therefore will be on lifetime anticoagulation. She made the switch from Coumadin to Xarelto about 2 years ago. Pt. Is doing well on her Jennye Moccasin daily.She states she has no difficulty with the medication. She is very happy with the change from Coumadin. She has no issues with the medication. She is compliant daily.She denies any worsening shortness of breath , cough, chest pain, leg swelling, pain with inspiration, no hemoptysis or blood in the stool.She states she no longer has dyspnea with exertion.She is no longer using her CPAP machine.(previously managed by [facility] ) She has lost  weight ( 14 pounds over the last year.) She states she is eating healthier.  Test Results: 10/02/15  PSG >> score at 4%, severe REM related OSA, desats to 75% on room air.  OSA was being followed by Dr. Rexene Alberts / [facility], but patient states she is no longer wearing CPAP device.  CBC Latest Ref Rng & Units 12/18/2015 12/21/2014 01/10/2014  WBC 4.0 - 10.5 K/uL - 11.1(H) 9.8  Hemoglobin 12.2 - 16.2 g/dL 13.1 13.7 13.0  Hematocrit 36.0 - 46.0 % - 41.8 38.7  Platelets 150.0 - 400.0 K/uL - 415.0(H) 384    BMP Latest Ref Rng & Units 12/18/2015 05/16/2015 12/21/2014  Glucose 65 - 99 mg/dL 87 83 93  BUN 7 - 25 mg/dL 19 14 16   Creatinine 0.50 - 0.99 mg/dL 1.26(H) 1.15(H) 1.43(H)  Sodium 135 - 146 mmol/L 141 140 139  Potassium 3.5 - 5.3 mmol/L 3.9 4.5 3.9  Chloride 98 - 110 mmol/L 99 101 102  CO2 20 - 31 mmol/L 26 31 29   Calcium 8.6 - 10.4 mg/dL 9.5 9.4 10.5   Past medical hx Past Medical History:  Diagnosis Date  . Anemia   . CKD (chronic kidney disease), stage III    pt  doesn't know why this is listed  . DJD (degenerative joint disease) of hip   . GERD (gastroesophageal reflux disease)    h/o in the 1990s  . Heart murmur   . Hyperlipidemia   . Hypertension    sees Dr. Gilda Crease  . Obesity (BMI 30-39.9)   . 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 Recurrent pulmonary embolism [facility])    see pulmonologist Dr. Asencion Noble  . Sprain and strain of wrist      Social History  Substance Use Topics  . Smoking status: Never Smoker  . Smokeless tobacco: Never Used  . Alcohol use No    Tobacco Cessation: Patient is a never smoker  Past surgical hx, Family hx, Social hx all reviewed.  Current Outpatient Prescriptions on File Prior to Visit  Medication Sig  . amitriptyline (ELAVIL) 25 MG tablet Take 2 tablets (50 mg total) by mouth at bedtime.  Marland Kitchen atorvastatin (LIPITOR) 40 MG tablet TAKE 1 TABLET BY MOUTH EVERY DAY  . ferrous sulfate 325 (65 FE) MG tablet TAKE 1 TABLET BY MOUTH TWICE A DAY  . furosemide (LASIX) 20 MG tablet Take 20 mg by mouth every other day.   Marland Kitchen  guaiFENesin-dextromethorphan (ROBITUSSIN DM) 100-10 MG/5ML syrup Take 5 mLs by mouth at bedtime.  . metoprolol succinate (TOPROL-XL) 100 MG 24 hr tablet Take 1 tablet (100 mg total) by mouth at bedtime. Take with or immediately following a meal.  . omeprazole (PRILOSEC) 20 MG capsule TAKE ONE CAPSULE BY MOUTH EVERY DAY  . rivaroxaban (XARELTO) 20 MG TABS tablet Take 1 tablet (20 mg total) by mouth daily.  Marland Kitchen spironolactone (ALDACTONE) 25 MG tablet Take 1 tablet (25 mg total) by mouth daily.  . verapamil (CALAN-SR) 240 MG CR tablet TAKE 1 TABLET BY MOUTH AT BEDTIME  . triamcinolone (KENALOG) 0.025 % ointment Apply 1 application topically 2 (two) times daily. As needed to affected area (Patient not taking: Reported on 02/26/2017)   No current  facility-administered medications on file prior to visit.      No Known Allergies  Review Of Systems:  Constitutional:   No  weight loss, night sweats,  Fevers, chills, fatigue, or  lassitude.  HEENT:   No headaches,  Difficulty swallowing,  Tooth/dental problems, or  Sore throat,                No sneezing, itching, ear ache, nasal congestion, post nasal drip,   CV:  No chest pain,  Orthopnea, PND, swelling in lower extremities, anasarca, dizziness, palpitations, syncope.   GI  No heartburn, indigestion, abdominal pain, nausea, vomiting, diarrhea, change in bowel habits, loss of appetite, bloody stools.   Resp: No shortness of breath with exertion or at rest.  No excess mucus, no productive cough,  No non-productive cough,  No coughing up of blood.  No change in color of mucus.  No wheezing.  No chest wall deformity  Skin: no rash or lesions.  GU: no dysuria, change in color of urine, no urgency or frequency.  No flank pain, no hematuria   MS:  No joint pain or swelling.  No decreased range of motion.  No back pain.  Psych:  No change in mood or affect. No depression or anxiety.  No memory loss.   Vital Signs BP 124/86 (BP Location: Left Arm, Cuff Size: Normal)   Pulse 96   Ht 5\' 5"  (1.651 m)   Wt 264 lb (119.7 kg)   SpO2 100%   BMI 43.93 kg/m    Physical Exam:  General- No distress,  A&Ox3, pleasant ENT: No sinus tenderness, TM clear, pale nasal mucosa, no oral exudate,no post nasal drip, no LAN Cardiac: S1, S2, regular rate and rhythm, no murmur Chest: No wheeze/ rales/ dullness; no accessory muscle use, no nasal flaring, no sternal retractions Abd.: Soft Non-tender, nondistended, bowel sounds positive Ext: No clubbing cyanosis, edema Neuro:  normal strength, cranial nerves intact Skin: No rashes, warm and dry Psych: normal mood and behavior   Assessment/Plan  VTE (venous thromboembolism) Stable on Xarelto 20 mg daily No evidence of recurrent PE/DVT No  evidence of bleeding Plan Continue Xaralto 20 mg daily as prescribed. We will send in a new prescription ( Keep 11 months ahead) Be aware of new DVT/ PE symptoms - increasing shortness of breath, pain / swelling in lower extremity, chest pain or pain with inspiration  If you develop fatigue let us know so we can check labs..   Call if new blood in stool, coughing up blood,. Seek emergency care for bleeding that you cannot stop. Bleeding precautions reviewed   Follow up in one year with Dr. Lake Bells for PE / DVT follow up Please contact office for  sooner follow up if symptoms do not improve or worsen or seek emergency care      Magdalen Spatz, NP 02/26/2017  8:25 PM

## 2017-02-26 NOTE — Patient Instructions (Addendum)
It is nice to meet you. Continue Xaralto 20 mg daily as prescribed. We will send in a new prescription ( Keep 11 months ahead) Be aware of new DVT/ PE symptoms - increasing shortness of breath, pain / swelling in lower extremity, chest pain or pain with inspiration  If you develop fatigue let us know so we can check labs..   Call if new blood in stool, coughing up blood,. Seek emergency care for bleeding that he cannot stop.   Follow up in one year with Dr. Lake Bells for PE / DVT follow up Please contact office for sooner follow up if symptoms do not improve or worsen or seek emergency care

## 2017-02-27 NOTE — Progress Notes (Signed)
Reviewed, agree 

## 2017-05-06 ENCOUNTER — Other Ambulatory Visit: Payer: Self-pay | Admitting: Pulmonary Disease

## 2017-05-07 ENCOUNTER — Other Ambulatory Visit: Payer: Self-pay | Admitting: Family Medicine

## 2017-05-09 ENCOUNTER — Ambulatory Visit (INDEPENDENT_AMBULATORY_CARE_PROVIDER_SITE_OTHER): Payer: Medicare HMO | Admitting: Family Medicine

## 2017-05-09 ENCOUNTER — Encounter: Payer: Self-pay | Admitting: Family Medicine

## 2017-05-09 VITALS — BP 164/82 | HR 89 | Temp 98.1°F | Wt 265.6 lb

## 2017-05-09 DIAGNOSIS — Z1231 Encounter for screening mammogram for malignant neoplasm of breast: Secondary | ICD-10-CM | POA: Diagnosis not present

## 2017-05-09 DIAGNOSIS — E785 Hyperlipidemia, unspecified: Secondary | ICD-10-CM | POA: Diagnosis not present

## 2017-05-09 DIAGNOSIS — D649 Anemia, unspecified: Secondary | ICD-10-CM

## 2017-05-09 DIAGNOSIS — M25562 Pain in left knee: Secondary | ICD-10-CM

## 2017-05-09 DIAGNOSIS — Z1239 Encounter for other screening for malignant neoplasm of breast: Secondary | ICD-10-CM

## 2017-05-09 DIAGNOSIS — M25561 Pain in right knee: Secondary | ICD-10-CM | POA: Diagnosis not present

## 2017-05-09 DIAGNOSIS — M170 Bilateral primary osteoarthritis of knee: Secondary | ICD-10-CM

## 2017-05-09 DIAGNOSIS — Z23 Encounter for immunization: Secondary | ICD-10-CM

## 2017-05-09 DIAGNOSIS — I10 Essential (primary) hypertension: Secondary | ICD-10-CM | POA: Diagnosis not present

## 2017-05-09 MED ORDER — BARIATRIC ROLLATOR MISC: 0 refills | Status: DC

## 2017-05-09 MED ORDER — METHYLPREDNISOLONE ACETATE 80 MG/ML IJ SUSP
80.0000 mg | Freq: Once | INTRAMUSCULAR | Status: AC
  Administered 2017-05-09: 80 mg via INTRAMUSCULAR

## 2017-05-09 NOTE — Patient Instructions (Addendum)
Did knee injections today Will work on getting you a rollator for help with getting around  Keep appointment for Annual Wellness Visit   Follow up with me in 3 months   Be well, Dr. Ardelia Mems

## 2017-05-09 NOTE — Assessment & Plan Note (Signed)
Bilateral knees injected today. Discussedadditional mobility options. She is willing to try Rollator with a seat so that she can rest, rather than becoming dependent on an electric wheelchair.Order entered into Epic. I will message her clinic nurse Lauren to coordinate her getting this DME. Patient appreciative.

## 2017-05-09 NOTE — Assessment & Plan Note (Signed)
Check lipids today 

## 2017-05-09 NOTE — Assessment & Plan Note (Signed)
Uncontrolled today, but did not yet take blood pressure medications. Will have blood pressure rechecked when she returns for annual wellness visit in a few weeks. Check lab work today: CMET, lipids, CBC.

## 2017-05-09 NOTE — Progress Notes (Signed)
Date of Visit: 05/09/2017   HPI:  Knee OA: patient has history of bilateral hip replacements in the past. Has known significant bilateral knee arthritis. Walks with a cane now, but has lots of trouble with pain in her knees lately. She is interested in something else to help her with mobility. Recently was seen at a clinic here in [place] and was given gel shots back in June. She had some relief for about 2 months. She is interested in having steroid knee injections done today. She does not want to have any knee surgery. Is interested in mobility aids including a Rollator or possibly even an IT trainer wheelchair.  Hypertension: blood pressure up this morning but she did not take her medications yet today.takes verapamil 240 mg daily, spironolactone 25 mg daily, metoprolol succinate 100 mg daily.denies having chest pain or shortness of breath.  Hyperlipidemia - taking atorvastatin 40mg  daily. Tolerating well. Due for lipids. Fasting today.  Health maintenance - due for Pneumovax 23.Agreeable to getting this today. Thinks her last Tdap was about 8 years ago. Has upcoming annual wellness visit scheduled.  ROS: See HPI.  Aneta: history of hyperlipidemia, anemia, hypertension, DVT onchronic anticoagulation with xarelto, CKD3, GERD, bilateral knee OA  PHYSICAL EXAM: BP (!) 164/82   Pulse 89   Temp 98.1 F (36.7 C) (Oral)   Wt 265 lb 9.6 oz (120.5 kg)   SpO2 99%   BMI 44.20 kg/m  Gen: no acute distress, pleasant, cooperative HEENT: normocephalic, atraumatic, mmm Heart:  Regular rate and rhythm, no murmur Lungs: clear to auscultation bilaterally normal work of breathing  Neuro: alert, grossly nonfocal speech normal Ext: mild lower extremity edema bilateral lower extremities Bilateral knees without effusion or warmth. Mild crepitus with flexion & extension of knees.  PROCEDURE NOTE:  After informed written consent was obtained, patient was seated on exam table. R knee was prepped with  alcohol swab. Utilizing anterolateral approach, patient's right knee was injected intraarticularly with mixture of 1cc of depomedrol 40mg /mL and 4cc of 1% lidocaine without epinephrine.   Attention them turned to left knee, which was prepped with alcohol swab. Utilizing anterolateral approach, patient's left knee was injected intraarticularly with mixture of 1cc of depomedrol 40mg /mL and 4cc of 1% lidocaine. Patient tolerated the procedure well without immediate complications. Given post-procedure instructions.   ASSESSMENT/PLAN:  Health maintenance:  -flu shot given today  -need to look for colonoscopy records, patient believes has had -will need to check [facility] for Tdap -PCV23 given today -screening mammogram ordered -patient has Annual Wellness Visit scheduled in several weeks to follow up on HM items  Osteoarthritis of both knees Bilateral knees injected today. Discussedadditional mobility options. She is willing to try Rollator with a seat so that she can rest, rather than becoming dependent on an electric wheelchair.Order entered into Epic. I will message her clinic nurse Lauren to coordinate her getting this DME. Patient appreciative.  HYPERTENSION, BENIGN SYSTEMIC Uncontrolled today, but did not yet take blood pressure medications. Will have blood pressure rechecked when she returns for annual wellness visit in a few weeks. Check lab work today: CMET, lipids, CBC.  ANEMIA, OTHER, UNSPECIFIED Not on iron supplement presently. Check CBC today.  Hyperlipidemia Check lipids today.  FOLLOW UP: Follow up in 3 months with me for above issues Keep appointment for Annual Wellness Visit   [place] J. Ardelia Mems, [place]

## 2017-05-09 NOTE — Assessment & Plan Note (Signed)
Not on iron supplement presently. Check CBC today.

## 2017-05-10 LAB — CBC
Hematocrit: 38.6 % (ref 34.0–46.6)
Hemoglobin: 12.3 g/dL (ref 11.1–15.9)
MCH: 28.9 pg (ref 26.6–33.0)
MCHC: 31.9 g/dL (ref 31.5–35.7)
MCV: 91 fL (ref 79–97)
Platelets: 310 10*3/uL (ref 150–379)
RBC: 4.26 x10E6/uL (ref 3.77–5.28)
RDW: 15.5 % — ABNORMAL HIGH (ref 12.3–15.4)
WBC: 7.4 10*3/uL (ref 3.4–10.8)

## 2017-05-10 LAB — CMP14+EGFR
ALT: 9 IU/L (ref 0–32)
AST: 17 IU/L (ref 0–40)
Albumin/Globulin Ratio: 1.3 (ref 1.2–2.2)
Albumin: 4 g/dL (ref 3.5–4.8)
Alkaline Phosphatase: 81 IU/L (ref 39–117)
BUN/Creatinine Ratio: 6 — ABNORMAL LOW (ref 12–28)
BUN: 7 mg/dL — ABNORMAL LOW (ref 8–27)
Bilirubin Total: 0.5 mg/dL (ref 0.0–1.2)
CO2: 24 mmol/L (ref 20–29)
Calcium: 9.9 mg/dL (ref 8.7–10.3)
Chloride: 103 mmol/L (ref 96–106)
Creatinine, Ser: 1.21 mg/dL — ABNORMAL HIGH (ref 0.57–1.00)
GFR calc Af Amer: 52 mL/min/{1.73_m2} — ABNORMAL LOW (ref >59)
GFR calc non Af Amer: 45 mL/min/{1.73_m2} — ABNORMAL LOW (ref >59)
Globulin, Total: 3.1 g/dL (ref 1.5–4.5)
Glucose: 83 mg/dL (ref 65–99)
Potassium: 4.1 mmol/L (ref 3.5–5.2)
Sodium: 143 mmol/L (ref 134–144)
Total Protein: 7.1 g/dL (ref 6.0–8.5)

## 2017-05-10 LAB — LIPID PANEL
Chol/HDL Ratio: 2.3 ratio (ref 0.0–4.4)
Cholesterol, Total: 153 mg/dL (ref 100–199)
HDL: 68 mg/dL (ref >39)
LDL Calculated: 67 mg/dL (ref 0–99)
Triglycerides: 89 mg/dL (ref 0–149)
VLDL Cholesterol Cal: 18 mg/dL (ref 5–40)

## 2017-05-12 ENCOUNTER — Encounter: Payer: Self-pay | Admitting: Family Medicine

## 2017-05-14 ENCOUNTER — Other Ambulatory Visit: Payer: Self-pay | Admitting: Family Medicine

## 2017-05-14 DIAGNOSIS — M170 Bilateral primary osteoarthritis of knee: Secondary | ICD-10-CM

## 2017-05-27 ENCOUNTER — Encounter: Payer: Self-pay | Admitting: Licensed Clinical Social Worker

## 2017-05-27 ENCOUNTER — Encounter: Payer: Self-pay | Admitting: *Deleted

## 2017-05-27 ENCOUNTER — Ambulatory Visit (INDEPENDENT_AMBULATORY_CARE_PROVIDER_SITE_OTHER): Payer: Medicare HMO | Admitting: *Deleted

## 2017-05-27 VITALS — Ht 65.0 in | Wt 253.4 lb

## 2017-05-27 DIAGNOSIS — Z1331 Encounter for screening for depression: Secondary | ICD-10-CM

## 2017-05-27 NOTE — Progress Notes (Signed)
   Patient presented for AWV, however, scored 20 on PHQ-9 including a 3 on # 9. Patient has no plan to harm herself. Visit stopped and warm hand-off to In-House LCSW. Patient will r/s AWV and schedule appt with PCP to discuss depression. Hubbard Hartshorn, RN, BSN

## 2017-05-27 NOTE — Progress Notes (Signed)
ESTIMATE TIME:30 minutes Type of Service: Jette warm handoff  Interpreter:No.   SUBJECTIVE: [name] is a 71 y.o. female referred by Ander Purpura RN during AWV for: symptoms of  depression.and stressors related to: family issues.   Patient was pleasant and wanted to talk about family stressor of her 71 year old adopted daughter. Reports she doesn't talk to anyone about her problems. However today patient was very talkative, talking about things from 30 plus years ago, not able to stay on task.  Duration of problem: States family stressors for 20 years, her ability to manage the stress has changed Impact: causes stress for her  Current / Hx of substance use: not assessed Risk of harm to self or others: Patient is positive on #9 of PHQ-9.  Denies wanting to hurt or kill self or anyone else. She does not want to or has any plan to take her life. States "I have been here 70 years which is what He promised so at times I am just ready to go" . Patient reports she has strong faith which keeps her going.  LIFE CONTEXT:  [place] lives with 71 year old adopted special needs daughter ,14 year old adopted daughter   School / Work /Fun: retired, was a foster parent for many years.  Life changes: stress with 3 year old daughter  GOALS: Patient will reduce symptoms of: depression, and increase knowledge and/or ability UX:LKGMWN skills, self-management skills and stress reduction, .  INTERVENTION: , Reflective listening ; Motivational Interviewing and Supportive Counseling   PHQ 9=20,Severity: severe depression.   ISSUES DISCUSSED: Integrated care services, support system, previous and current coping skills.    ASSESSMENT:Patient currently experiencing symptoms of depression.  Symptoms exacerbated by family stressors and patient having difficulty managing. Patient may benefit from, and is a little hesitant to receive further assessment and brief therapeutic  interventions to assist with managing her symptoms.  Patient is in agreement to make an appointment with PCP to discuss concern of her symptoms.  Patient is very talkative and difficult to keep on task during conversation.  She may benefit from traditional therapy which will provide her longer time than [facility].  Patient also hesitant about this as an option but will think about it.    PLAN:   1.Patient will F/U with  PCP  2. F/U with [facility] after appointment with PCP  Warm Hand Off Completed.     Casimer Lanius, LCSW Licensed Clinical Social Worker [facility]   [phone] 12:52 PM

## 2017-06-05 DIAGNOSIS — M170 Bilateral primary osteoarthritis of knee: Secondary | ICD-10-CM | POA: Diagnosis not present

## 2017-06-05 DIAGNOSIS — M0549 Rheumatoid myopathy with rheumatoid arthritis of multiple sites: Secondary | ICD-10-CM | POA: Diagnosis not present

## 2017-06-05 DIAGNOSIS — Z96649 Presence of unspecified artificial hip joint: Secondary | ICD-10-CM | POA: Diagnosis not present

## 2017-06-05 DIAGNOSIS — M169 Osteoarthritis of hip, unspecified: Secondary | ICD-10-CM | POA: Diagnosis not present

## 2017-06-05 DIAGNOSIS — G4733 Obstructive sleep apnea (adult) (pediatric): Secondary | ICD-10-CM | POA: Diagnosis not present

## 2017-06-05 DIAGNOSIS — M1610 Unilateral primary osteoarthritis, unspecified hip: Secondary | ICD-10-CM | POA: Diagnosis not present

## 2017-06-10 ENCOUNTER — Ambulatory Visit: Payer: Medicare HMO

## 2017-06-13 ENCOUNTER — Ambulatory Visit: Payer: Medicare HMO | Admitting: Family Medicine

## 2017-06-20 ENCOUNTER — Telehealth: Payer: Self-pay | Admitting: *Deleted

## 2017-06-20 ENCOUNTER — Encounter: Payer: Self-pay | Admitting: Family Medicine

## 2017-06-20 NOTE — Telephone Encounter (Signed)
Patient left message on nurse line requesting return call from PCP to discuss information she needs to send to [facility]. Needs to turn in by 06/23/2017. Hubbard Hartshorn, RN, BSN

## 2017-06-20 NOTE — Telephone Encounter (Signed)
Called patient to discuss.  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  As part of this evaluation, she needs a summary of her medical issues written in a letter for her to supply to Medicaid.  I have written a letter summarizing her medical issues, and will place it at the front desk for her to pick up.  She is aware that she can pick it up after 8:30 AM on Monday.  Patient was appreciative.  Leeanne Rio, MD

## 2017-07-10 ENCOUNTER — Ambulatory Visit: Payer: Medicare HMO

## 2017-07-17 ENCOUNTER — Ambulatory Visit: Payer: Medicare HMO

## 2017-07-25 ENCOUNTER — Ambulatory Visit: Payer: Medicare HMO | Admitting: Family Medicine

## 2017-08-09 ENCOUNTER — Other Ambulatory Visit: Payer: Self-pay | Admitting: Family Medicine

## 2017-08-15 ENCOUNTER — Ambulatory Visit: Payer: Medicare HMO

## 2017-08-22 ENCOUNTER — Ambulatory Visit: Payer: Medicare HMO | Admitting: Family Medicine

## 2017-08-27 ENCOUNTER — Other Ambulatory Visit: Payer: Self-pay | Admitting: Pulmonary Disease

## 2017-09-05 ENCOUNTER — Ambulatory Visit: Payer: Medicare Other | Admitting: Family Medicine

## 2017-09-26 ENCOUNTER — Other Ambulatory Visit: Payer: Self-pay

## 2017-09-26 ENCOUNTER — Ambulatory Visit (INDEPENDENT_AMBULATORY_CARE_PROVIDER_SITE_OTHER): Payer: Medicare Other | Admitting: Family Medicine

## 2017-09-26 ENCOUNTER — Encounter: Payer: Self-pay | Admitting: Family Medicine

## 2017-09-26 VITALS — BP 132/78 | HR 82 | Temp 98.2°F | Wt 255.8 lb

## 2017-09-26 DIAGNOSIS — M170 Bilateral primary osteoarthritis of knee: Secondary | ICD-10-CM

## 2017-09-26 DIAGNOSIS — M1990 Unspecified osteoarthritis, unspecified site: Secondary | ICD-10-CM | POA: Diagnosis not present

## 2017-09-26 DIAGNOSIS — I10 Essential (primary) hypertension: Secondary | ICD-10-CM

## 2017-09-26 MED ORDER — TETANUS-DIPHTH-ACELL PERTUSSIS 5-2.5-18.5 LF-MCG/0.5 IM SUSP: 0.5000 mL | Freq: Once | INTRAMUSCULAR | 0 refills | Status: DC

## 2017-09-26 MED ORDER — TETANUS-DIPHTH-ACELL PERTUSSIS 5-2.5-18.5 LF-MCG/0.5 IM SUSP: 0.5000 mL | Freq: Once | INTRAMUSCULAR | 0 refills | Status: AC

## 2017-09-26 MED ORDER — METHYLPREDNISOLONE ACETATE 40 MG/ML IJ SUSP
40.0000 mg | Freq: Once | INTRAMUSCULAR | Status: AC
  Administered 2017-09-26: 40 mg via INTRAMUSCULAR

## 2017-09-26 NOTE — Patient Instructions (Addendum)
Knee injections today  Will work on getting hospital bed.  Continue current medications   See the handout on how to schedule your colonoscopy. This is an important screening test for colon cancer.  See the handout on how to schedule your mammogram. This is an important test to screen for breast cancer.  Take prescription for tetanus shot to your pharmacy and have them give it to you.  Follow up with me in 3 months, sooner if needed.  Be well, Dr. Ardelia Mems

## 2017-09-26 NOTE — Assessment & Plan Note (Signed)
Well-controlled.  Continue current regimen. 

## 2017-09-26 NOTE — Assessment & Plan Note (Signed)
Knees injected again today. Follow up in 3 months, can perform injections again at that time if needed.

## 2017-09-26 NOTE — Progress Notes (Signed)
Date of Visit: 09/26/2017   HPI:  Patient presents for routine follow up.  Hypertension: currently taking Lasix 20 mg every other day, metoprolol XL 100 mg daily, spironolactone 25 mg daily, verapamil 240 mg daily.  Tolerating these well.  Denies any chest pain or shortness of breath.  Knee osteoarthritis: Last had injections of her knees done in October.  This lasted quite a while and she would like to have these done again today.  Mood follow-up: Had annual wellness visit scheduled back in October, at which time she completed a PHQ 9 with a score of 20, and question of a 9 and answers 3.  Discussed this with her today.  She denies feeling anxious or depressed.  Denies having thoughts of hurting herself or anyone else.  Says that she has was going through a tough time with her oldest daughter when she filled that out, and those issues are now doing better.  ROS: See HPI.  [place]: history of bilateral knee OA, hypertension, hyperlipidemia, anemia, VTE, CKD, gerd  PHYSICAL EXAM: BP 132/78   Pulse 82   Temp 98.2 F (36.8 C) (Oral)   Wt 255 lb 12.8 oz (116 kg)   SpO2 97%   BMI 42.57 kg/m   Gen: no acute distress, pleasant, cooperative, well appearing HEENT: normocephalic, atraumatic, moist mucous membranes  Heart: regular rate and rhythm, no murmur Lungs: clear to auscultation bilaterally, normal work of breathing  Neuro: alert, grossly nonfocal, speech normal Ext: bilateral knees with crepitus with flexion & extension. No warmth or joint effusions appreciated.  PROCEDURE NOTE:  After informed written consent was obtained, patient was seated on exam table. R knee was prepped with alcohol swab. Utilizing anterolateral approach, patient's right knee was injected intraarticularly with mixture of 1cc of depomedrol 40mg /mL and 4cc of 1% lidocaine without epinephrine.   Attention them turned to left knee, which was prepped with alcohol swab. Utilizing anterolateral approach, patient's left  knee was injected intraarticularly with mixture of 1cc of depomedrol 40mg /mL and 4cc of 1% lidocaine. Patient tolerated the procedure well without immediate complications. Given post-procedure instructions.  Injections performed by Dr. Dennie Fetters under my immediate supervision.  ASSESSMENT/PLAN:  Health maintenance:  -given rx for Tdap -mammogram previously ordered, gave handout on how to schedule & reminded patient to do this -gave handout on colonoscopy & how to schedule  HYPERTENSION, BENIGN SYSTEMIC Well controlled. Continue current regimen.   Osteoarthritis of both knees Knees injected again today. Follow up in 3 months, can perform injections again at that time if needed.  Mood f/u Denies any SI/HI or feelings of depression/anxiety today. PHQ-9 score is 4. Follow up as needed.  FOLLOW UP: Follow up in 3 mos for above issues  [place] J. Ardelia Mems, [place]

## 2017-10-14 ENCOUNTER — Telehealth: Payer: Self-pay

## 2017-10-14 NOTE — Telephone Encounter (Signed)
Pt called to check status of her getting her hospital bed. Pt call back [phone] Wallace Cullens, RN

## 2017-10-16 NOTE — Telephone Encounter (Signed)
RN team, I placed an order for a hospital bed during patient's last visit. What do we need to do to get it to her? Thanks! Leeanne Rio, MD

## 2017-10-17 NOTE — Telephone Encounter (Signed)
Melissa has pulled the order and supporting documentation and will get the order processing. She will let us know if anything else is needed. Danley Danker, RN [facility] RN)

## 2017-10-17 NOTE — Telephone Encounter (Signed)
Community message sent to Darlina Guys with [facility] to see what other information she needs to proceed with this order. Danley Danker, RN [facility] RN)

## 2017-10-17 NOTE — Telephone Encounter (Signed)
Ok thanks. Red team, can you call patient and give her an update that [facility] is working on the hospital bed order?  Thanks Leeanne Rio, MD

## 2017-10-20 NOTE — Telephone Encounter (Signed)
Called patient per Dr. Ardelia Mems and informed her that an order for a new hospital bed was sent to [facility] and they are working on it.[name], CMA

## 2017-11-10 DIAGNOSIS — M169 Osteoarthritis of hip, unspecified: Secondary | ICD-10-CM | POA: Diagnosis not present

## 2017-11-10 DIAGNOSIS — G4733 Obstructive sleep apnea (adult) (pediatric): Secondary | ICD-10-CM | POA: Diagnosis not present

## 2017-11-16 ENCOUNTER — Other Ambulatory Visit: Payer: Self-pay | Admitting: Family Medicine

## 2017-11-20 ENCOUNTER — Other Ambulatory Visit: Payer: Self-pay | Admitting: Family Medicine

## 2017-12-10 DIAGNOSIS — M169 Osteoarthritis of hip, unspecified: Secondary | ICD-10-CM | POA: Diagnosis not present

## 2017-12-10 DIAGNOSIS — G4733 Obstructive sleep apnea (adult) (pediatric): Secondary | ICD-10-CM | POA: Diagnosis not present

## 2017-12-17 ENCOUNTER — Telehealth: Payer: Self-pay | Admitting: Family Medicine

## 2017-12-17 ENCOUNTER — Ambulatory Visit: Payer: Medicare Other | Admitting: Family Medicine

## 2017-12-17 NOTE — Telephone Encounter (Signed)
Has appointment today for knee injections Spoke with her that really frequent injections might not be good and could damage her cartilage. She is not taking any medications for pain I suggested Tylenol arthritis but not to take any NSAIDs due to xarelto She agrees and will make follow up appointment with Dr Ardelia Mems for injections as needed

## 2018-01-10 DIAGNOSIS — M169 Osteoarthritis of hip, unspecified: Secondary | ICD-10-CM | POA: Diagnosis not present

## 2018-01-10 DIAGNOSIS — G4733 Obstructive sleep apnea (adult) (pediatric): Secondary | ICD-10-CM | POA: Diagnosis not present

## 2018-01-20 ENCOUNTER — Telehealth: Payer: Self-pay | Admitting: *Deleted

## 2018-01-20 NOTE — Telephone Encounter (Signed)
Pt is calling because she would like to have injections in her knees.  She recalls Dr. Erin Hearing calling her last month and explaining that she should not have these frequently.  She is aware that her last injections were in february, but states that the injections are the only thing that helps.  States that she cant even clean her house.    Advised I could not make appt, but I would send a message to MD for options.  Pt is agreeable and is eager to receive call. Kalvin Buss, Salome Spotted, CMA

## 2018-01-21 NOTE — Telephone Encounter (Signed)
Let patient know she should make an appointment with Dr Ardelia Mems to discuss the injections and possibly get them  Thanks  [facility]

## 2018-01-22 NOTE — Telephone Encounter (Signed)
LMOVM for pt to call back and schedule an appt to talk about injections with dr Ardelia Mems. Daemyn Gariepy Kennon Holter, CMA

## 2018-01-27 ENCOUNTER — Other Ambulatory Visit: Payer: Self-pay

## 2018-01-27 ENCOUNTER — Ambulatory Visit (INDEPENDENT_AMBULATORY_CARE_PROVIDER_SITE_OTHER): Payer: Medicare Other | Admitting: Family Medicine

## 2018-01-27 ENCOUNTER — Encounter: Payer: Self-pay | Admitting: Family Medicine

## 2018-01-27 VITALS — BP 126/80 | HR 67 | Temp 98.0°F | Ht 65.0 in | Wt 246.0 lb

## 2018-01-27 DIAGNOSIS — M25562 Pain in left knee: Secondary | ICD-10-CM | POA: Diagnosis not present

## 2018-01-27 DIAGNOSIS — M170 Bilateral primary osteoarthritis of knee: Secondary | ICD-10-CM

## 2018-01-27 DIAGNOSIS — M25561 Pain in right knee: Secondary | ICD-10-CM | POA: Diagnosis not present

## 2018-01-27 DIAGNOSIS — K219 Gastro-esophageal reflux disease without esophagitis: Secondary | ICD-10-CM

## 2018-01-27 DIAGNOSIS — G8929 Other chronic pain: Secondary | ICD-10-CM

## 2018-01-27 DIAGNOSIS — Z1211 Encounter for screening for malignant neoplasm of colon: Secondary | ICD-10-CM

## 2018-01-27 MED ORDER — TETANUS-DIPHTH-ACELL PERTUSSIS 5-2.5-18.5 LF-MCG/0.5 IM SUSP: 0.5000 mL | Freq: Once | INTRAMUSCULAR | 0 refills | Status: AC

## 2018-01-27 MED ORDER — METHYLPREDNISOLONE ACETATE 80 MG/ML IJ SUSP
80.0000 mg | Freq: Once | INTRAMUSCULAR | Status: AC
  Administered 2018-01-27: 80 mg via INTRAMUSCULAR

## 2018-01-27 NOTE — Patient Instructions (Signed)
Injected both knees today  Referring to GI for colonoscopy  See handout on how to schedule your mammogram  Take tetanus shot prescription to pharmacy and get it there  Call us with a list of your current medications.  Be well, Dr. Ardelia Mems

## 2018-01-27 NOTE — Progress Notes (Signed)
Date of Visit: 01/27/2018   HPI:  Patient presents to discuss getting injections in her knees.  OA - Last had injections done in February. Patient has history of both hips being replaced in the past, and strongly desires to avoid surgery of knees. Has known OA of bilateral knees. She understands that steroid knee injections can decrease knee cartilage over time, but she finds significant relief from these shots and would like to proceed with them anyways. Gets at least a month of relief from the shots.  GERD - patient not sure if she is taking omeprazole or not. Does not know her medications and did not bring bottles or list with her. She will call us with her medication list when she gets home. GERD is well controlled in either case.  HM items - due for mammogram, colonoscopy, and Tdap  ROS: See HPI.  [place]: history of GERD, OA of knees, hypertension, hyperlipidemia, VTE, CKD3  PHYSICAL EXAM: BP 126/80 (BP Location: Right Arm, Patient Position: Sitting, Cuff Size: Normal)   Pulse 67   Temp 98 F (36.7 C) (Oral)   Ht 5\' 5"  (1.651 m)   Wt 246 lb (111.6 kg)   SpO2 97%   BMI 40.94 kg/m  Gen: no acute distress, pleasant, cooperative Extremities: bilateral knees without effusion, warmth, or redness. Crepitus with flexion & extension bilaterally.   PROCEDURE NOTE:  After informed written consent was obtained, patient was seated on exam table. R knee was prepped with alcohol swab. Utilizing anterolateral approach, patient's right knee was injected intraarticularly with mixture of depomedrol 40mg  and 4cc of 1% lidocaine without epinephrine.   Attention them turned to left knee, which was prepped with alcohol swab. Utilizing anterolateral approach, patient's left knee was injected intraarticularly with mixture of depomedrol 40mg  and 4cc of 1% lidocaine without epinephrine. Patient tolerated the procedure well without immediate complications. Noted immediate improvement in her pain  bilaterally. Given post-procedure instructions.    ASSESSMENT/PLAN:  Health maintenance:  -Tdap rx printed and given to patient, advised to take to her pharmacy to get this -refer to GI for colonoscopy -gave handout on how to schedule mammogram  Osteoarthritis of both knees Knees injected today 6/25 with improvement. Patient accepts risks of cartilage loss with repeated chronic steroid injections, but in her the benefits outweigh the risks as she strongly desires to avoid surgery and prefers to focus on pain control and function. May follow up in 3 months for next round of injections.  GERD (gastroesophageal reflux disease) Well controlled, though unclear if patient is taking omeprazole. She will call us with her medication regimen.  FOLLOW UP: Follow up as needed if symptoms worsen or fail to improve.    [place]. Ardelia Mems, [place]

## 2018-02-02 NOTE — Assessment & Plan Note (Signed)
Well controlled, though unclear if patient is taking omeprazole. She will call us with her medication regimen.

## 2018-02-02 NOTE — Assessment & Plan Note (Signed)
Knees injected today 6/25 with improvement. Patient accepts risks of cartilage loss with repeated chronic steroid injections, but in her the benefits outweigh the risks as she strongly desires to avoid surgery and prefers to focus on pain control and function. May follow up in 3 months for next round of injections.

## 2018-02-09 DIAGNOSIS — M169 Osteoarthritis of hip, unspecified: Secondary | ICD-10-CM | POA: Diagnosis not present

## 2018-02-09 DIAGNOSIS — G4733 Obstructive sleep apnea (adult) (pediatric): Secondary | ICD-10-CM | POA: Diagnosis not present

## 2018-02-17 ENCOUNTER — Other Ambulatory Visit: Payer: Self-pay | Admitting: Family Medicine

## 2018-03-12 DIAGNOSIS — M169 Osteoarthritis of hip, unspecified: Secondary | ICD-10-CM | POA: Diagnosis not present

## 2018-03-12 DIAGNOSIS — G4733 Obstructive sleep apnea (adult) (pediatric): Secondary | ICD-10-CM | POA: Diagnosis not present

## 2018-03-24 ENCOUNTER — Telehealth: Payer: Self-pay | Admitting: Pulmonary Disease

## 2018-03-24 DIAGNOSIS — I8290 Acute embolism and thrombosis of unspecified vein: Secondary | ICD-10-CM

## 2018-03-24 NOTE — Telephone Encounter (Signed)
Oh, OK, I could just see [name]'s note from 2017.  Fine to keep 15 min slot

## 2018-03-24 NOTE — Telephone Encounter (Signed)
Per her chart, patient saw you on 02/26/16 and then saw SG on 02/26/17. SG advised her to follow up in a year. Her current appt is in a 39min slot.   Do you still want her to be in a 30 minute slot? Please advise. Thanks!

## 2018-03-24 NOTE — Telephone Encounter (Signed)
Spoke with pt. She has a pending OV with BQ on 04/08/18. States that she normally has blood work done prior to her appointments.  BQ - please advise if you want the pt have blood work done before her appointment with you. Thanks.

## 2018-03-24 NOTE — Telephone Encounter (Signed)
Spoke with patient. She is aware of lab orders. Nothing further needed at time of call.

## 2018-03-24 NOTE — Telephone Encounter (Signed)
CBC, BMET I have never seen her before, please confirm this is a 30 min visit

## 2018-04-07 ENCOUNTER — Other Ambulatory Visit (INDEPENDENT_AMBULATORY_CARE_PROVIDER_SITE_OTHER): Payer: Medicare Other

## 2018-04-07 ENCOUNTER — Encounter: Payer: Self-pay | Admitting: Family Medicine

## 2018-04-07 DIAGNOSIS — I8290 Acute embolism and thrombosis of unspecified vein: Secondary | ICD-10-CM | POA: Diagnosis not present

## 2018-04-07 LAB — BASIC METABOLIC PANEL
BUN: 13 mg/dL (ref 6–23)
CO2: 26 mEq/L (ref 19–32)
Calcium: 10.2 mg/dL (ref 8.4–10.5)
Chloride: 105 mEq/L (ref 96–112)
Creatinine, Ser: 1.06 mg/dL (ref 0.40–1.20)
GFR: 65.56 mL/min (ref >60.00)
Glucose, Bld: 106 mg/dL — ABNORMAL HIGH (ref 70–99)
Potassium: 3.7 mEq/L (ref 3.5–5.1)
Sodium: 142 mEq/L (ref 135–145)

## 2018-04-07 LAB — CBC WITH DIFFERENTIAL/PLATELET
Basophils Absolute: 0.1 10*3/uL (ref 0.0–0.1)
Basophils Relative: 0.8 % (ref 0.0–3.0)
Eosinophils Absolute: 0.1 10*3/uL (ref 0.0–0.7)
Eosinophils Relative: 0.9 % (ref 0.0–5.0)
HCT: 40.1 % (ref 36.0–46.0)
Hemoglobin: 13.2 g/dL (ref 12.0–15.0)
Lymphocytes Relative: 33.2 % (ref 12.0–46.0)
Lymphs Abs: 2.3 10*3/uL (ref 0.7–4.0)
MCHC: 32.9 g/dL (ref 30.0–36.0)
MCV: 89.9 fl (ref 78.0–100.0)
Monocytes Absolute: 0.4 10*3/uL (ref 0.1–1.0)
Monocytes Relative: 6.3 % (ref 3.0–12.0)
Neutro Abs: 4.2 10*3/uL (ref 1.4–7.7)
Neutrophils Relative %: 58.8 % (ref 43.0–77.0)
Platelets: 332 10*3/uL (ref 150.0–400.0)
RBC: 4.46 Mil/uL (ref 3.87–5.11)
RDW: 15.7 % — ABNORMAL HIGH (ref 11.5–15.5)
WBC: 7.1 10*3/uL (ref 4.0–10.5)

## 2018-04-08 ENCOUNTER — Ambulatory Visit (INDEPENDENT_AMBULATORY_CARE_PROVIDER_SITE_OTHER): Payer: Medicare Other | Admitting: Pulmonary Disease

## 2018-04-08 ENCOUNTER — Encounter: Payer: Self-pay | Admitting: Pulmonary Disease

## 2018-04-08 VITALS — BP 148/90 | HR 98 | Ht 64.96 in | Wt 243.0 lb

## 2018-04-08 DIAGNOSIS — I8290 Acute embolism and thrombosis of unspecified vein: Secondary | ICD-10-CM

## 2018-04-08 DIAGNOSIS — Z23 Encounter for immunization: Secondary | ICD-10-CM

## 2018-04-08 NOTE — Patient Instructions (Signed)
Recurrent pulmonary embolism: Continue daily Xarelto for life If you have an episode of bleeding this needs to be investigated immediately in emergency department setting  Follow-up with me in 1 year  High-dose flu shot today

## 2018-04-08 NOTE — Progress Notes (Signed)
Synopsis: Former patient of Dr. 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  First PE in 1980, on coumadin for approx 20 years.  Has been on Xarelto since 2015 and tolerated well.  No new symptoms.    Subjective:   PATIENT ID: [name] GENDER: female DOB: [birth year], MRN: [id number]   HPI  Chief Complaint  Patient presents with  . Follow-up    1 year follow-up   [name] says that this has been a stable year.  She has not had any bleeding.  She continues to take Xarelto daily and she says that she does well with it.  She says that she has lost 50 pounds through dieting and cutting back on refined sugars in the last year.  Past Medical History:  Diagnosis Date  . Anemia   . CKD (chronic kidney disease), stage III ([place])    pt doesn't know why this is listed  . DJD (degenerative joint disease) of hip   . GERD (gastroesophageal reflux disease)    h/o in the 1990s  . Heart murmur   . Hyperlipidemia   . Hypertension    sees Dr. Gilda Crease  . Obesity (BMI 30-39.9)   . 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 Recurrent pulmonary embolism [facility])    see pulmonologist Dr. Asencion Noble  . Sprain and strain of wrist      Family History  Problem Relation Age of Onset  . Heart disease Mother   . Heart attack Mother   . Heart disease Father   . Heart attack Father   . Cancer Brother   . Cancer Sister   . Alcohol abuse Sister      Social History   Socioeconomic History  . Marital status: Widowed    Spouse name: Not on file  . Number of children: 2  . Years of education: College  . Highest education level: Not on file  Occupational History    Employer: RETIRED  Social Needs  . Financial resource strain: Not on file  . Food insecurity:    Worry: Not on file    Inability: Not  on file  . Transportation needs:    Medical: Not on file    Non-medical: Not on file  Tobacco Use  . Smoking status: Never Smoker  . Smokeless tobacco: Never Used  Substance and Sexual Activity  . Alcohol use: No  . Drug use: No  . Sexual activity: Not Currently    Birth control/protection: None  Lifestyle  . Physical activity:    Days per week: Not on file    Minutes per session: Not on file  . Stress: Not on file  Relationships  . Social connections:    Talks on phone: Not on file    Gets together: Not on file    Attends religious service: Not on file    Active member of club or organization: Not on file    Attends meetings of clubs or organizations: Not on file    Relationship status: Not on file  . Intimate partner violence:    Fear of current or ex partner: Not on file    Emotionally abused: Not on file    Physically abused: Not on file    Forced sexual activity: Not on file  Other Topics Concern  .  Not on file  Social History Narrative   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    Lives at home with  daughter born in '87.    Non smoker. Non alcohol drinker.;    Fosters children .              Not on File   Outpatient Medications Prior to Visit  Medication Sig Dispense Refill  . atorvastatin (LIPITOR) 40 MG tablet TAKE 1 TABLET BY MOUTH EVERY DAY 90 tablet 3  . furosemide (LASIX) 20 MG tablet Take 20 mg by mouth every other day.     . metoprolol succinate (TOPROL-XL) 100 MG 24 hr tablet Take 1 tablet (100 mg total) by mouth at bedtime. Take with or immediately following a meal. 90 tablet 3  . Misc. Devices (BARIATRIC ROLLATOR) MISC Use daily for mobility support 1 each 0  . omeprazole (PRILOSEC) 20 MG capsule TAKE 1 CAPSULE BY MOUTH EVERY DAY 90 capsule 0  . spironolactone (ALDACTONE) 25 MG tablet Take 1 tablet (25 mg total) by mouth daily. 30 tablet 5  . verapamil (CALAN-SR) 240 MG CR tablet TAKE 1 TABLET BY  MOUTH EVERY EVENING AT BEDTIME 90 tablet 1  . XARELTO 20 MG TABS tablet TAKE 1 TABLET BY MOUTH EVERY DAY 90 tablet 1   No facility-administered medications prior to visit.     ROS    Objective:  Physical Exam   Vitals:   04/08/18 1336  BP: (!) 148/90  Pulse: 98  SpO2: 99%  Weight: 243 lb (110.2 kg)  Height: 5' 4.96" (1.65 m)    Gen: well appearing HENT: OP clear, TM's clear, neck supple PULM: CTA B, normal percussion CV: RRR, no mgr, trace edema GI: BS+, soft, nontender Derm: no cyanosis or rash Psyche: normal mood and affect   CBC    Component Value Date/Time   WBC 7.1 04/07/2018 1032   RBC 4.46 04/07/2018 1032   HGB 13.2 04/07/2018 1032   HGB 12.3 05/09/2017 1214   HCT 40.1 04/07/2018 1032   HCT 38.6 05/09/2017 1214   PLT 332.0 04/07/2018 1032   PLT 310 05/09/2017 1214   MCV 89.9 04/07/2018 1032   MCV 91 05/09/2017 1214   MCH 28.9 05/09/2017 1214   MCH 28.3 01/10/2014 1107   MCHC 32.9 04/07/2018 1032   RDW 15.7 (H) 04/07/2018 1032   RDW 15.5 (H) 05/09/2017 1214   LYMPHSABS 2.3 04/07/2018 1032   MONOABS 0.4 04/07/2018 1032   EOSABS 0.1 04/07/2018 1032   BASOSABS 0.1 04/07/2018 1032     Chest imaging:  PFT:  Labs:  Path:  Echo: ECHO 01/2014 with normal RA & PA  Heart Catheterization:       Assessment & Plan:   VTE (venous thromboembolism)  Discussion: This has been a stable interval for [name].  She has recurrent pulmonary embolism and does well with daily Xarelto.  Her kidney function is stable as of yesterday and there is no evidence of bleeding.  Plan: Recurrent pulmonary embolism: Continue daily Xarelto for life If you have an episode of bleeding this needs to be investigated immediately in emergency department setting  Follow-up with me in 1 year  High-dose flu shot today    Current Outpatient Medications:  .  atorvastatin (LIPITOR) 40 MG tablet, TAKE 1 TABLET BY MOUTH EVERY DAY, Disp: 90 tablet, Rfl: 3 .  furosemide  (LASIX) 20 MG tablet, Take 20 mg by mouth every other  day. , Disp: , Rfl:  .  metoprolol succinate (TOPROL-XL) 100 MG 24 hr tablet, Take 1 tablet (100 mg total) by mouth at bedtime. Take with or immediately following a meal., Disp: 90 tablet, Rfl: 3 .  Misc. Devices (BARIATRIC ROLLATOR) MISC, Use daily for mobility support, Disp: 1 each, Rfl: 0 .  omeprazole (PRILOSEC) 20 MG capsule, TAKE 1 CAPSULE BY MOUTH EVERY DAY, Disp: 90 capsule, Rfl: 0 .  spironolactone (ALDACTONE) 25 MG tablet, Take 1 tablet (25 mg total) by mouth daily., Disp: 30 tablet, Rfl: 5 .  verapamil (CALAN-SR) 240 MG CR tablet, TAKE 1 TABLET BY MOUTH EVERY EVENING AT BEDTIME, Disp: 90 tablet, Rfl: 1 .  XARELTO 20 MG TABS tablet, TAKE 1 TABLET BY MOUTH EVERY DAY, Disp: 90 tablet, Rfl: 1

## 2018-04-12 DIAGNOSIS — M169 Osteoarthritis of hip, unspecified: Secondary | ICD-10-CM | POA: Diagnosis not present

## 2018-04-12 DIAGNOSIS — G4733 Obstructive sleep apnea (adult) (pediatric): Secondary | ICD-10-CM | POA: Diagnosis not present

## 2018-05-12 DIAGNOSIS — M169 Osteoarthritis of hip, unspecified: Secondary | ICD-10-CM | POA: Diagnosis not present

## 2018-05-12 DIAGNOSIS — G4733 Obstructive sleep apnea (adult) (pediatric): Secondary | ICD-10-CM | POA: Diagnosis not present

## 2018-06-12 DIAGNOSIS — G4733 Obstructive sleep apnea (adult) (pediatric): Secondary | ICD-10-CM | POA: Diagnosis not present

## 2018-06-12 DIAGNOSIS — M169 Osteoarthritis of hip, unspecified: Secondary | ICD-10-CM | POA: Diagnosis not present

## 2018-06-13 ENCOUNTER — Other Ambulatory Visit: Payer: Self-pay | Admitting: Pulmonary Disease

## 2018-06-14 ENCOUNTER — Other Ambulatory Visit: Payer: Self-pay | Admitting: Family Medicine

## 2018-06-19 ENCOUNTER — Other Ambulatory Visit: Payer: Self-pay

## 2018-06-19 ENCOUNTER — Encounter: Payer: Self-pay | Admitting: Family Medicine

## 2018-06-19 ENCOUNTER — Ambulatory Visit (INDEPENDENT_AMBULATORY_CARE_PROVIDER_SITE_OTHER): Payer: Medicare Other | Admitting: Family Medicine

## 2018-06-19 VITALS — BP 124/76 | HR 105 | Temp 98.1°F | Ht 65.0 in | Wt 239.0 lb

## 2018-06-19 DIAGNOSIS — K219 Gastro-esophageal reflux disease without esophagitis: Secondary | ICD-10-CM | POA: Diagnosis not present

## 2018-06-19 DIAGNOSIS — Z1211 Encounter for screening for malignant neoplasm of colon: Secondary | ICD-10-CM | POA: Diagnosis not present

## 2018-06-19 DIAGNOSIS — E785 Hyperlipidemia, unspecified: Secondary | ICD-10-CM

## 2018-06-19 DIAGNOSIS — I10 Essential (primary) hypertension: Secondary | ICD-10-CM | POA: Diagnosis not present

## 2018-06-19 DIAGNOSIS — M170 Bilateral primary osteoarthritis of knee: Secondary | ICD-10-CM | POA: Diagnosis not present

## 2018-06-19 MED ORDER — METHYLPREDNISOLONE ACETATE 40 MG/ML IJ SUSP
40.0000 mg | Freq: Once | INTRAMUSCULAR | Status: AC
  Administered 2018-06-19: 40 mg via INTRAMUSCULAR

## 2018-06-19 MED ORDER — ATORVASTATIN CALCIUM 40 MG PO TABS: 40.0000 mg | ORAL_TABLET | Freq: Every day | ORAL | 3 refills | Status: DC

## 2018-06-19 MED ORDER — TETANUS-DIPHTH-ACELL PERTUSSIS 5-2.5-18.5 LF-MCG/0.5 IM SUSP: 0.5000 mL | Freq: Once | INTRAMUSCULAR | 0 refills | Status: AC

## 2018-06-19 NOTE — Assessment & Plan Note (Signed)
Patient confirmed she is taking omeprazole & GERD is well controlled.

## 2018-06-19 NOTE — Addendum Note (Signed)
Addended by: Londell Moh T on: 06/19/2018 12:30 PM   Modules accepted: Orders

## 2018-06-19 NOTE — Assessment & Plan Note (Signed)
Out of blood pressure medications for 3 months but blood pressure is normal today. Suspect she does not need blood pressure medication anymore due to the weight she has intentionally lost. Congratulated her on these efforts.

## 2018-06-19 NOTE — Assessment & Plan Note (Signed)
Return for fasting labs as she is not fasting today. Check CMET & lipids.

## 2018-06-19 NOTE — Progress Notes (Signed)
Date of Visit: 06/19/2018   HPI:  Knee OA - would like injections today. Accepts risks of further cartilage loss with repeated knee injections. Last injections >3 months ago.  Obesity - Has lost weight. Drinking more water. Eating smaller portions. Less sodas now. Feels well.  Hypertension - has been out of blood pressure medications for 3 months. No chest pain or shortness of breath.  GERD - taking omeprazole, well controlled with this medication.   ROS: See HPI.  [place]: history of knee OA, GERD, hypertension, hyperlipidemia, anemia, DVT/PE, CKD3  PHYSICAL EXAM: BP 124/76   Pulse (!) 105   Temp 98.1 F (36.7 C) (Oral)   Ht 5\' 5"  (1.651 m)   Wt 239 lb (108.4 kg)   SpO2 98%   BMI 39.77 kg/m  Gen: no acute distress, pleasant, cooperative HEENT: normocephalic, atraumatic  Heart: regular rate and rhythm, no murmur Lungs: clear to auscultation bilaterally, normal work of breathing  Neuro: normal speech, alert & oriented Ext: bilateral knees with crepitus on flexion & extension. No joint effusion, redness, or warmth.  PROCEDURE NOTE:  After informed written consent was obtained, patient was seated in chair. R knee was prepped with alcohol swab. Utilizing anterolateral approach, patient's right knee was injected intraarticularly with mixture of 1cc of depomedrol 40mg /mL and 4cc of 1% lidocaine without epinephrine.   Attention them turned to left knee, which was prepped with alcohol swab. Utilizing anterolateral approach, patient's left knee was injected intraarticularly with mixture of 1cc of depomedrol 40mg /mL and 4cc of 1% lidocaine. Patient tolerated the procedure well without immediate complications. Given post-procedure instructions.  ASSESSMENT/PLAN:  Health maintenance:  -colon cancer screening - does not want colonoscopy. Willing to do cologuard. Ordered. -handout given on mammogram -rx for tdap given to patient  Osteoarthritis of both knees Bilateral knee injections  today. Can have next round of injections in 3 months if desired. Patient accepts risks of cartilage loss.  GERD (gastroesophageal reflux disease) Patient confirmed she is taking omeprazole & GERD is well controlled.  HYPERTENSION, BENIGN SYSTEMIC Out of blood pressure medications for 3 months but blood pressure is normal today. Suspect she does not need blood pressure medication anymore due to the weight she has intentionally lost. Congratulated her on these efforts.  Hyperlipidemia Return for fasting labs as she is not fasting today. Check CMET & lipids.  FOLLOW UP: Follow up in 6 months for routine medical issues, sooner if wants knees injected before then  [place] J. Ardelia Mems, [place]

## 2018-06-19 NOTE — Patient Instructions (Addendum)
Great job with the weight loss!  On your way out, schedule an appointment one morning to come back for fasting labs. Do not eat or drink anything other than water the morning of your lab appointment until after your labs are drawn.  Get mammogram cologuard will send you a kit to do the stool sample Get your tetanus shot  Follow up with me in 6 months, sooner if needed. Can get knee injections in 3 more months.  Be well, Dr. Ardelia Mems

## 2018-06-19 NOTE — Assessment & Plan Note (Signed)
Bilateral knee injections today. Can have next round of injections in 3 months if desired. Patient accepts risks of cartilage loss.

## 2018-07-12 DIAGNOSIS — G4733 Obstructive sleep apnea (adult) (pediatric): Secondary | ICD-10-CM | POA: Diagnosis not present

## 2018-07-12 DIAGNOSIS — M169 Osteoarthritis of hip, unspecified: Secondary | ICD-10-CM | POA: Diagnosis not present

## 2018-08-06 ENCOUNTER — Telehealth: Payer: Self-pay

## 2018-08-06 NOTE — Telephone Encounter (Signed)
Patient left message on nurse line reporting a bad cough x 6 days. She requests to speak with PCP about whether or not she should come in. She does not want to go elsewhere.  Call back is [phone]  *There are no appts available as of now for Friday, January 3.  Danley Danker, RN [facility] RN)

## 2018-08-07 NOTE — Telephone Encounter (Signed)
Called patient, no answer. LVM asking her to call back. Leeanne Rio, MD

## 2018-08-12 DIAGNOSIS — M169 Osteoarthritis of hip, unspecified: Secondary | ICD-10-CM | POA: Diagnosis not present

## 2018-08-12 DIAGNOSIS — G4733 Obstructive sleep apnea (adult) (pediatric): Secondary | ICD-10-CM | POA: Diagnosis not present

## 2018-09-11 ENCOUNTER — Other Ambulatory Visit: Payer: Self-pay | Admitting: Family Medicine

## 2018-09-24 ENCOUNTER — Ambulatory Visit: Payer: Medicare Other | Admitting: Family Medicine

## 2018-09-28 ENCOUNTER — Ambulatory Visit: Payer: Medicare Other | Admitting: Family Medicine

## 2018-10-04 DIAGNOSIS — J189 Pneumonia, unspecified organism: Secondary | ICD-10-CM | POA: Insufficient documentation

## 2018-10-04 HISTORY — DX: Pneumonia, unspecified organism: J18.9

## 2018-10-21 ENCOUNTER — Telehealth: Payer: Self-pay | Admitting: Family Medicine

## 2018-10-21 NOTE — Telephone Encounter (Signed)
Late entry - form completed prior to leaving the office earlier today & placed in RN team inbox Leeanne Rio, MD

## 2018-10-21 NOTE — Telephone Encounter (Signed)
Application for Renewal of Disability Parking Placard  form dropped off for at front desk for completion.  Verified that patient section of form has been completed.  Last DOS/WCC with PCP was11/15/2019Placed form in team folder to be completed by clinical staff.  [name]

## 2018-10-21 NOTE — Telephone Encounter (Signed)
Reviewed form and placed in PCP's box for completion.  .Rayni Nemitz R Zev Blue, CMA  

## 2018-10-22 NOTE — Telephone Encounter (Signed)
Patient aware that forms are available for pick up at front desk. Copy made for batch scanning.   Alisa Brake, RN ([facility] RN)    

## 2018-11-01 ENCOUNTER — Emergency Department (HOSPITAL_COMMUNITY)
Admission: EM | Admit: 2018-11-01 | Discharge: 2018-11-01 | Disposition: A | Discharge status: Home / Self Care | Payer: Medicare Other | Attending: Emergency Medicine | Admitting: Emergency Medicine

## 2018-11-01 ENCOUNTER — Other Ambulatory Visit: Payer: Self-pay

## 2018-11-01 ENCOUNTER — Encounter (HOSPITAL_COMMUNITY): Payer: Self-pay | Admitting: Emergency Medicine

## 2018-11-01 ENCOUNTER — Emergency Department (HOSPITAL_COMMUNITY): Payer: Medicare Other

## 2018-11-01 DIAGNOSIS — Z79899 Other long term (current) drug therapy: Secondary | ICD-10-CM | POA: Diagnosis not present

## 2018-11-01 DIAGNOSIS — N183 Chronic kidney disease, stage 3 (moderate): Secondary | ICD-10-CM | POA: Diagnosis not present

## 2018-11-01 DIAGNOSIS — R531 Weakness: Secondary | ICD-10-CM | POA: Diagnosis present

## 2018-11-01 DIAGNOSIS — R55 Syncope and collapse: Secondary | ICD-10-CM | POA: Diagnosis not present

## 2018-11-01 DIAGNOSIS — E860 Dehydration: Secondary | ICD-10-CM | POA: Diagnosis not present

## 2018-11-01 DIAGNOSIS — J189 Pneumonia, unspecified organism: Secondary | ICD-10-CM | POA: Diagnosis not present

## 2018-11-01 DIAGNOSIS — Z96643 Presence of artificial hip joint, bilateral: Secondary | ICD-10-CM | POA: Insufficient documentation

## 2018-11-01 DIAGNOSIS — Z7901 Long term (current) use of anticoagulants: Secondary | ICD-10-CM | POA: Insufficient documentation

## 2018-11-01 DIAGNOSIS — R0902 Hypoxemia: Secondary | ICD-10-CM | POA: Diagnosis not present

## 2018-11-01 DIAGNOSIS — J181 Lobar pneumonia, unspecified organism: Secondary | ICD-10-CM

## 2018-11-01 DIAGNOSIS — R110 Nausea: Secondary | ICD-10-CM | POA: Diagnosis not present

## 2018-11-01 DIAGNOSIS — I129 Hypertensive chronic kidney disease with stage 1 through stage 4 chronic kidney disease, or unspecified chronic kidney disease: Secondary | ICD-10-CM | POA: Insufficient documentation

## 2018-11-01 DIAGNOSIS — R079 Chest pain, unspecified: Secondary | ICD-10-CM | POA: Diagnosis not present

## 2018-11-01 DIAGNOSIS — R42 Dizziness and giddiness: Secondary | ICD-10-CM | POA: Diagnosis not present

## 2018-11-01 LAB — CBC WITH DIFFERENTIAL/PLATELET
Abs Immature Granulocytes: 0.03 10*3/uL (ref 0.00–0.07)
Basophils Absolute: 0 10*3/uL (ref 0.0–0.1)
Basophils Relative: 0 %
Eosinophils Absolute: 0 10*3/uL (ref 0.0–0.5)
Eosinophils Relative: 0 %
HCT: 38.9 % (ref 36.0–46.0)
Hemoglobin: 11.7 g/dL — ABNORMAL LOW (ref 12.0–15.0)
Immature Granulocytes: 0 %
Lymphocytes Relative: 13 %
Lymphs Abs: 0.9 10*3/uL (ref 0.7–4.0)
MCH: 28.4 pg (ref 26.0–34.0)
MCHC: 30.1 g/dL (ref 30.0–36.0)
MCV: 94.4 fL (ref 80.0–100.0)
Monocytes Absolute: 0.6 10*3/uL (ref 0.1–1.0)
Monocytes Relative: 8 %
Neutro Abs: 5.6 10*3/uL (ref 1.7–7.7)
Neutrophils Relative %: 79 %
Platelets: 194 10*3/uL (ref 150–400)
RBC: 4.12 MIL/uL (ref 3.87–5.11)
RDW: 14.2 % (ref 11.5–15.5)
WBC: 7.1 10*3/uL (ref 4.0–10.5)
nRBC: 0 % (ref 0.0–0.2)

## 2018-11-01 LAB — COMPREHENSIVE METABOLIC PANEL
ALT: 20 U/L (ref 0–44)
AST: 26 U/L (ref 15–41)
Albumin: 3.1 g/dL — ABNORMAL LOW (ref 3.5–5.0)
Alkaline Phosphatase: 53 U/L (ref 38–126)
Anion gap: 7 (ref 5–15)
BUN: 12 mg/dL (ref 8–23)
CO2: 23 mmol/L (ref 22–32)
Calcium: 8.5 mg/dL — ABNORMAL LOW (ref 8.9–10.3)
Chloride: 108 mmol/L (ref 98–111)
Creatinine, Ser: 1.01 mg/dL — ABNORMAL HIGH (ref 0.44–1.00)
GFR calc Af Amer: >60 mL/min (ref >60)
GFR calc non Af Amer: 56 mL/min — ABNORMAL LOW (ref >60)
Glucose, Bld: 141 mg/dL — ABNORMAL HIGH (ref 70–99)
Potassium: 3.7 mmol/L (ref 3.5–5.1)
Sodium: 138 mmol/L (ref 135–145)
Total Bilirubin: 0.6 mg/dL (ref 0.3–1.2)
Total Protein: 6.3 g/dL — ABNORMAL LOW (ref 6.5–8.1)

## 2018-11-01 LAB — TROPONIN I: Troponin I: <0.03 ng/mL (ref <0.03)

## 2018-11-01 MED ORDER — AMOXICILLIN-POT CLAVULANATE 875-125 MG PO TABS: 1.0000 | ORAL_TABLET | Freq: Two times a day (BID) | ORAL | 0 refills | Status: DC

## 2018-11-01 MED ORDER — AMOXICILLIN-POT CLAVULANATE 875-125 MG PO TABS
1.0000 | ORAL_TABLET | Freq: Once | ORAL | Status: AC
  Administered 2018-11-01: 1 via ORAL
  Filled 2018-11-01: qty 1

## 2018-11-01 MED ORDER — ONDANSETRON 4 MG PO TBDP: 4.0000 mg | ORAL_TABLET | Freq: Three times a day (TID) | ORAL | 0 refills | Status: DC | PRN

## 2018-11-01 MED ORDER — AZITHROMYCIN 250 MG PO TABS: 250.0000 mg | ORAL_TABLET | Freq: Every day | ORAL | 0 refills | Status: DC

## 2018-11-01 MED ORDER — AZITHROMYCIN 250 MG PO TABS
500.0000 mg | ORAL_TABLET | Freq: Once | ORAL | Status: AC
  Administered 2018-11-01: 500 mg via ORAL
  Filled 2018-11-01: qty 2

## 2018-11-01 NOTE — ED Notes (Signed)
Pt placed on purewick 

## 2018-11-01 NOTE — ED Provider Notes (Signed)
[facility] EMERGENCY DEPARTMENT Provider Note   CSN: [id number] Arrival date & time:       History   Chief Complaint Chief Complaint  Patient presents with  . Chest Pain  . Weakness  . Near Syncope    HPI [name] is a 73 y.o. female.     Patient with history patient with history of chronic kidney disease, recurrent thromboembolism on Xarelto, no previous abdominal surgeries --presents to the emergency department today with approximately 2 days of generalized weakness and decreased appetite.  Patient symptoms worsened this morning when she developed more severe nausea and one episode of vomiting as well as one episode of diarrhea.  She felt lightheaded with standing this morning.  She has some mild abdominal cramping due to her diarrhea however no focal tenderness or pain.  Patient had a short episode of right-sided chest pain while in the ambulance which resolved spontaneously.  Otherwise, no chest pain.  She denies headache, URI symptoms, cough.  No reported fevers.  She denies dysuria, increased frequency urgency.  No skin rashes or areas of swelling.  No treatments prior to arrival.  States that current symptoms do not feel like when she had blood clots in the past.  [name] was evaluated in Emergency Department on 11/01/2018 for the symptoms described in the history of present illness. She was evaluated in the context of the global COVID-19 pandemic, which necessitated consideration that the patient might be at risk for infection with the SARS-CoV-2 virus that causes COVID-19. Institutional protocols and algorithms that pertain to the evaluation of patients at risk for COVID-19 are in a state of rapid change based on information released by regulatory bodies including the CDC and federal and state organizations. These policies and algorithms were followed during the patient's care in the ED.      Past Medical History:  Diagnosis Date  . Anemia    . CKD (chronic kidney disease), stage III ([place])    pt doesn't know why this is listed  . DJD (degenerative joint disease) of hip   . GERD (gastroesophageal reflux disease)    h/o in the 1990s  . Heart murmur   . Hyperlipidemia   . Hypertension    sees Dr. Gilda Crease  . Obesity (BMI 30-39.9)   . 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 Recurrent pulmonary embolism [facility])    see pulmonologist Dr. Asencion Noble  . Sprain and strain of wrist     Patient Active Problem List   Diagnosis Date Noted  . Venous stasis dermatitis 12/21/2014  . Healthcare maintenance 08/15/2014  . GERD (gastroesophageal reflux disease) 01/13/2014  . Dyspnea on exertion 01/10/2014  . Pain in joint, ankle and foot 07/16/2013  . Osteoarthritis of both knees 04/22/2013  . Ganglion cyst 02/14/2013  . Arm numbness 01/27/2013  . Radiculopathy affecting upper extremity 07/23/2012  . Shoulder pain, bilateral 05/05/2012  . Back pain 11/15/2011  . Numbness of fingers 11/15/2011  . Postmenopausal bleeding 09/04/2011  . VTE (venous thromboembolism) 08/24/2009  . OSTEOARTHRITIS, HIP, LEFT 04/07/2008  . CHRONIC KIDNEY DISEASE STAGE III (MODERATE) 03/25/2007  . Hyperlipidemia 10/02/2006  . OBESITY, NOS 10/02/2006  . ANEMIA, OTHER, UNSPECIFIED 10/02/2006  . HYPERTENSION, BENIGN SYSTEMIC 10/02/2006    Past Surgical History:  Procedure Laterality Date  . CARPAL TUNNEL RELEASE  09/02/2012  Procedure: CARPAL TUNNEL RELEASE;  Surgeon: Winfield Cunas, MD;  Location: [facility];  Service: Neurosurgery;  Laterality: Right;  RIGHT carpal tunnel release  . DILATION AND CURETTAGE OF UTERUS  02/21/2012   Procedure: DILATATION AND CURETTAGE;  Surgeon: Emily Filbert, MD;  Location: [facility];  Service: Gynecology;  Laterality: N/A;  . Femoral vein ligation  12/2002  . [place] filter   11/03/2003   R Jugular  . PARTIAL HIP ARTHROPLASTY  Left, 02/2009   Dr Hal Morales  . SHOULDER SURGERY  Left 07/15/08   Dr Hal Morales  . TOTAL HIP ARTHROPLASTY  R 05/2203, L 2010  . TRANSTHORACIC ECHOCARDIOGRAM     EF 55-60%, RV mod dilated, mildly increased LV wall thickness  . TUBAL LIGATION  1981     OB History    Gravida  5   Para  2   Term  2   Preterm      AB  3   Living  2     SAB  3   TAB      Ectopic      Multiple      Live Births               Home Medications    Prior to Admission medications   Medication Sig Start Date End Date Taking? Authorizing Provider  atorvastatin (LIPITOR) 40 MG tablet Take 1 tablet (40 mg total) by mouth daily. 06/19/18   Leeanne Rio, MD  omeprazole (PRILOSEC) 20 MG capsule TAKE 1 CAPSULE BY MOUTH EVERY DAY 09/11/18   Leeanne Rio, MD  XARELTO 20 MG TABS tablet TAKE 1 TABLET BY MOUTH EVERY DAY 06/15/18   Juanito Doom, MD    Family History Family History  Problem Relation Age of Onset  . Heart disease Mother   . Heart attack Mother   . Heart disease Father   . Heart attack Father   . Cancer Brother   . Cancer Sister   . Alcohol abuse Sister     Social History Social History   Tobacco Use  . Smoking status: Never Smoker  . Smokeless tobacco: Never Used  Substance Use Topics  . Alcohol use: No  . Drug use: No     Allergies   Patient has no known allergies.   Review of Systems Review of Systems  Constitutional: Positive for appetite change and fatigue. Negative for fever.  HENT: Negative for rhinorrhea and sore throat.   Eyes: Negative for redness.  Respiratory: Negative for cough.   Cardiovascular: Positive for chest pain.  Gastrointestinal: Positive for abdominal pain (Generalized, cramping), diarrhea, nausea and vomiting.  Genitourinary: Negative for dysuria.  Musculoskeletal: Negative for myalgias.  Skin: Negative for rash.  Neurological: Positive for weakness (Generalized). Negative  for headaches.     Physical Exam Updated Vital Signs BP 124/60   Temp 98.1 F (36.7 C)   Resp 16   Ht 5\' 4"  (1.626 m)   Wt 81.6 kg   SpO2 100%   BMI 30.90 kg/m   Physical Exam Vitals signs and nursing note reviewed.  Constitutional:      Appearance: She is well-developed.  HENT:     Head: Normocephalic and atraumatic.     Mouth/Throat:     Mouth: Mucous membranes are dry.     Comments: Mildly dry mucous membranes Eyes:     General:        Right eye: No discharge.  Left eye: No discharge.     Conjunctiva/sclera: Conjunctivae normal.  Neck:     Musculoskeletal: Normal range of motion and neck supple.  Cardiovascular:     Rate and Rhythm: Normal rate and regular rhythm.     Heart sounds: Normal heart sounds.  Pulmonary:     Effort: Pulmonary effort is normal.     Breath sounds: Normal breath sounds. No decreased breath sounds.  Abdominal:     Palpations: Abdomen is soft.     Tenderness: There is abdominal tenderness (Generalized, minimal). There is no guarding or rebound.  Skin:    General: Skin is warm and dry.  Neurological:     Mental Status: She is alert.      ED Treatments / Results  Labs (all labs ordered are listed, but only abnormal results are displayed) Labs Reviewed  CBC WITH DIFFERENTIAL/PLATELET - Abnormal; Notable for the following components:      Result Value   Hemoglobin 11.7 (*)    All other components within normal limits  COMPREHENSIVE METABOLIC PANEL - Abnormal; Notable for the following components:   Glucose, Bld 141 (*)    Creatinine, Ser 1.01 (*)    Calcium 8.5 (*)    Total Protein 6.3 (*)    Albumin 3.1 (*)    GFR calc non Af Amer 56 (*)    All other components within normal limits  TROPONIN I  URINALYSIS, ROUTINE W REFLEX MICROSCOPIC    EKG EKG Interpretation  Date/Time:  Sunday November 01 2018 12:58:04 EDT Ventricular Rate:  76 PR Interval:    QRS Duration: 110 QT Interval:  423 QTC Calculation: 476 R Axis:    -33 Text Interpretation:  Sinus rhythm Incomplete RBBB and LAFB Abnormal R-wave progression, early transition Probable left ventricular hypertrophy Anterior Q waves, possibly due to LVH no significant change since 2017 Confirmed by Sherwood Gambler [phone]) on 11/01/2018 1:12:15 PM   Radiology Dg Chest 2 View  Result Date: 11/01/2018 CLINICAL DATA:  Weakness and near syncope. EXAM: CHEST - 2 VIEW COMPARISON:  September 02, 2012 FINDINGS: There is opacity in the lateral right mid lung which is new since 2014. Mild atelectasis in the left base. The heart, hila, mediastinum, lungs, and pleura are otherwise unremarkable. IMPRESSION: New infiltrate in the right upper lobe may represent pneumonia in the appropriate clinical setting. Recommend short-term follow-up to ensure resolution. Electronically Signed   By: Dorise Bullion III M.D   On: 11/01/2018 14:13    Procedures Procedures (including critical care time)  Medications Ordered in ED Medications  amoxicillin-clavulanate (AUGMENTIN) 875-125 MG per tablet 1 tablet (1 tablet Oral Given 11/01/18 1553)  azithromycin (ZITHROMAX) tablet 500 mg (500 mg Oral Given 11/01/18 1553)     Initial Impression / Assessment and Plan / ED Course  I have reviewed the triage vital signs and the nursing notes.  Pertinent labs & imaging results that were available during my care of the patient were reviewed by me and considered in my medical decision making (see chart for details).        Patient seen and examined.  Patient afebrile in no distress.  No focal abdominal tenderness.  Symptoms currently are vague.  She appears dry on exam.  Evaluation with EKG, chest x-ray, blood work, urine test in progress.  Vital signs reviewed and are as follows: BP 124/60   Temp 98.1 F (36.7 C)   Resp 16   Ht 5\' 4"  (1.626 m)   Wt 81.6 kg  SpO2 100%   BMI 30.90 kg/m   3:37 PM Pt updated on results. Will give CAP treatment. She would like to go home. Vitals are normal  and patient continues to look well. Awaiting UA. We discussed need for PCP appt to ensure everything is improving and discussed signs and symptoms to return including worsening shortness of breath, fever, chest pain that is persistent or worse.   4:10 PM Handoff to [place] PA-C at shift change. Can d/c when urine results and patient tolerates fluids.   Final Clinical Impressions(s) / ED Diagnoses   Final diagnoses:  Community acquired pneumonia of right upper lobe of lung ([place])   Patient with generalized weakness, decreased appetite --one episode of nausea, vomiting, and diarrhea today which is resolved.  Labs are reassuring.  Chest x-ray demonstrates what appears to be a right upper lobe infiltrate.  This will be treated as community-acquired pneumonia with Augmentin and azithromycin.  Patient has a history of pulmonary embolism but is compliant with her anticoagulation and does not have tachycardia or hypoxia to suggest PE today.  She has a transient episode of chest pain during her ambulance ride here but this is resolved.  Normal troponin.  EKG nonischemic.  Patient appears well on exam and is in no distress.  She would like to go home.  She has appropriate PCP follow-up.  At this point will treat pneumonia.  Return instructions as above.   ED Discharge Orders         Ordered    amoxicillin-clavulanate (AUGMENTIN) 875-125 MG tablet  Every 12 hours     11/01/18 1611    azithromycin (ZITHROMAX) 250 MG tablet  Daily     11/01/18 1611    ondansetron (ZOFRAN ODT) 4 MG disintegrating tablet  Every 8 hours PRN     11/01/18 1611           Carlisle Cater, PA-C 11/01/18 1611    Sherwood Gambler, MD 11/02/18 [phone]

## 2018-11-01 NOTE — ED Notes (Signed)
Pt refused UTI check, requesting discharge. PA made aware

## 2018-11-01 NOTE — Discharge Instructions (Signed)
Please read and follow all provided instructions.  Your diagnoses today include:  1. Community acquired pneumonia of right upper lobe of lung ([place])     Tests performed today include:  Blood counts and electrolytes  Ekg and cardiac enzymes - no problems with the heart  Chest x-ray - shows pneumonia  Urine test  Vital signs. See below for your results today.   Medications prescribed:   Augmentin - antibiotic  You have been prescribed an antibiotic medicine: take the entire course of medicine even if you are feeling better. Stopping early can cause the antibiotic not to work.   Azithromycin - antibiotic for respiratory infection  You have been prescribed an antibiotic medicine: take the entire course of medicine even if you are feeling better. Stopping early can cause the antibiotic not to work.   Zofran (ondansetron) - for nausea and vomiting  Take any prescribed medications only as directed.  Home care instructions:  Follow any educational materials contained in this packet.  Take the complete course of antibiotics that you were prescribed.   BE VERY CAREFUL not to take multiple medicines containing Tylenol (also called acetaminophen). Doing so can lead to an overdose which can damage your liver and cause liver failure and possibly death.   Follow-up instructions: Please follow-up with your primary care provider in the next 3 days for further evaluation of your symptoms and to ensure resolution of your infection.   Return instructions:   Please return to the Emergency Department if you experience worsening symptoms.   Return immediately with worsening breathing, worsening shortness of breath, or if you feel it is taking you more effort to breathe.   Please return if you have any other emergent concerns.  Additional Information:  Your vital signs today were: BP 124/60    Temp 98.1 F (36.7 C)    Resp 16    Ht 5\' 4"  (1.626 m)    Wt 81.6 kg    SpO2 100%    BMI  30.90 kg/m  If your blood pressure (BP) was elevated above 135/85 this visit, please have this repeated by your doctor within one month. --------------

## 2018-11-01 NOTE — ED Notes (Signed)
"  Patient verbalizes understanding of discharge instructions. Opportunity for questioning and answers were provided.  pt discharged from ED ."  

## 2018-11-01 NOTE — ED Notes (Signed)
Pt transported to xray 

## 2018-11-01 NOTE — ED Provider Notes (Signed)
Care transfered from PA [place] at shift change.  See note for full HPI.  Patient 73 year old history of CKD, recurrent thromboembolism on Xarelto who presents to the department for 2 days of generalized weakness as well as decreased appetite.  Patient with intermittent nausea with one episode of NBNB emesis as well as one episode of NB diarrhea.  Has had some intermittent lightheadedness with standing.  Patient did have short episode of right-sided chest pain while in EMS which resolved spontaneously prior to arrival.  She has no active chest pain.  Denies fever, chills, cough, hemoptysis, melena or hematochezia, lower extremity edema, erythema or warmth.  No recent mobilization, recent surgeries.  Per previous provider labs reassuring.  Patient does have chest x-ray consistent with upper lobe pneumonia.  EKG without ischemic changes.  Patient given antibiotics in department. Plan for urinalysis and then DC home.  Patient with negative orthostatic vital signs in department.  Physical Exam  BP 109/90   Pulse 81   Temp 98.1 F (36.7 C)   Resp 20   Ht 5\' 4"  (1.626 m)   Wt 81.6 kg   SpO2 98%   BMI 30.90 kg/m   Physical Exam Vitals signs and nursing note reviewed.  Constitutional:      General: She is not in acute distress.    Appearance: She is well-developed.  HENT:     Head: Atraumatic.  Eyes:     Pupils: Pupils are equal, round, and reactive to light.  Neck:     Musculoskeletal: Normal range of motion.  Cardiovascular:     Rate and Rhythm: Normal rate.  Pulmonary:     Effort: No respiratory distress.     Comments: Clear to auscultation bilaterally without wheeze, rhonchi or rales.  Able speak in full sentences without difficulty.  No tachypnea or hypoxia. Abdominal:     General: There is no distension.     Comments: Soft without rebound or guarding.  Normoactive bowel sounds.  Musculoskeletal: Normal range of motion.     Comments: Moves all extremities without difficulty.  No  extremity edema, erythema or warmth.    Skin:    General: Skin is warm and dry.     Comments: No rashes or lesions.  Brisk capillary refill.  Neurological:     Mental Status: She is alert.     ED Course/Procedures     Procedures Labs Reviewed  CBC WITH DIFFERENTIAL/PLATELET - Abnormal; Notable for the following components:      Result Value   Hemoglobin 11.7 (*)    All other components within normal limits  COMPREHENSIVE METABOLIC PANEL - Abnormal; Notable for the following components:   Glucose, Bld 141 (*)    Creatinine, Ser 1.01 (*)    Calcium 8.5 (*)    Total Protein 6.3 (*)    Albumin 3.1 (*)    GFR calc non Af Amer 56 (*)    All other components within normal limits  TROPONIN I  URINALYSIS, ROUTINE W REFLEX MICROSCOPIC  Dg Chest 2 View  Result Date: 11/01/2018 CLINICAL DATA:  Weakness and near syncope. EXAM: CHEST - 2 VIEW COMPARISON:  September 02, 2012 FINDINGS: There is opacity in the lateral right mid lung which is new since 2014. Mild atelectasis in the left base. The heart, hila, mediastinum, lungs, and pleura are otherwise unremarkable. IMPRESSION: New infiltrate in the right upper lobe may represent pneumonia in the appropriate clinical setting. Recommend short-term follow-up to ensure resolution. Electronically Signed   By: Shanon Brow  Mee Hives M.D   On: 11/01/2018 14:37   MDM  73 year old female appears otherwise well presents for evaluation generalized weakness as well as decreased appetite.  Care transferred from previous provider pending urinalysis.  Labs personally reviewed- CBC without leukocytosis, metabolic panel without acute abnormality, troponin negative, chest x-ray consistent with right upper lobe pneumonia. Patient refused delta troponin.  Discussed risk versus benefit.  Patient voiced understanding of risk versus benefit and does not additional lab work at this time. EKG without ischemic changes.  Patient given antibiotics for pneumonia in department.   She does not meet Sirs or sepsis criteria.  Lungs clear to auscultation bilateral without wheeze, rhonchi or rales.  She has no tachypnea, tachycardia or hypoxia.  Patient able to ambulate without difficulty.  She is had no episodes of emesis in department.  Negative orthostatic vital signs.  Patient requesting DC home at this time.  Discussed with patient that we do not have a urinalysis.  Patient states "I do not need that."  Requesting DC home at this time without urinalysis.  Discussed risk versus benefit.  Patient voiced understanding risk versus benefit and continuing to request DC home.  Will DC home with antibiotics for pneumonia.  Patient able to tolerate p.o. intake in department without difficulty.  On repeat exam patient does not have a surgical abdomin and there are no peritoneal signs.  No indication of appendicitis, bowel obstruction, bowel perforation, cholecystitis, diverticulitis.  Reevaluation lungs clear.  No signs of acute respiratory distress.  Patient will be DC home with azithromycin and augmentin for CAP.  Discussed return precautions with patient.  Patient voiced understanding and is agreeable to follow-up.    1. Community acquired pneumonia           Nettie Elm, PA-C 11/01/18 1811    Tegeler, Gwenyth Allegra, MD 11/01/18 [phone]

## 2018-11-01 NOTE — ED Triage Notes (Signed)
Per [facility]- Pt picked up from home due to weakness and near syncopal episodes x2days.  Denies diarrhea and vomiting, other than one episode of diarrhea today.  Pt states she hasn't been able to eat or drink for 2days.  EMS states that Pt was positive for orthostatic vitals. Pt reports chest pain enroute to the hospital

## 2018-11-19 ENCOUNTER — Other Ambulatory Visit: Payer: Self-pay

## 2018-11-19 ENCOUNTER — Telehealth (INDEPENDENT_AMBULATORY_CARE_PROVIDER_SITE_OTHER): Payer: Medicare Other | Admitting: Family Medicine

## 2018-11-19 NOTE — Progress Notes (Signed)
Called patient. She was scheduled for telemedicine visit to follow up after recent pneumonia. She is improved and has had resolution of her symptoms. She is getting her appetite back as well. She feels like she is overall doing well.

## 2018-12-08 ENCOUNTER — Other Ambulatory Visit: Payer: Self-pay | Admitting: Pulmonary Disease

## 2019-01-08 ENCOUNTER — Telehealth: Payer: Self-pay | Admitting: *Deleted

## 2019-01-08 NOTE — Telephone Encounter (Signed)
Pt calls because she is in the process of getting life insurance.  She "needs a letter from Dr. Ardelia Mems about my health over the past 5 years"  Advised that sometimes companies request records, but pt is adamant that all they need is a letter. Christen Bame, CMA

## 2019-01-08 NOTE — Telephone Encounter (Signed)
I need something more specific than this - a copy in writing of what they are requesting etc before I can write any such letter. Please inform patient Thanks Leeanne Rio, MD

## 2019-01-11 NOTE — Telephone Encounter (Signed)
Spoke to patient concerning letter that she has request for life insurance policy.  Patient does not know specifically what letter should say.  Advised patient to call insurance company to inquire as to what they need.  Patient will call and let us know if records are needed for last five years or what specifically needs to be on said letter.  Patient states that she has no problem coming into office for visit if that is what is required by Dr. Ardelia Mems.  [name], [place]

## 2019-02-24 ENCOUNTER — Telehealth: Payer: Self-pay | Admitting: Family Medicine

## 2019-02-24 NOTE — Telephone Encounter (Signed)
Isidor Holts NP with [facility] call is calling to inform Dr. Ardelia Mems that the pt failed her mimini cog test today and will most likely need more in depth care and evaluation for demansia.

## 2019-02-25 NOTE — Telephone Encounter (Signed)
Pt called to check status of letter.  She never called to get more specifics.   Asked her to call and have them fax info to [place].   Pt will do so now. Christen Bame, CMA

## 2019-02-26 NOTE — Telephone Encounter (Signed)
Please schedule for either virtual or in person visit with PCP to discuss her paperwork and these concerns.  Dorris Singh, MD  Family Medicine Teaching Service

## 2019-03-04 NOTE — Telephone Encounter (Signed)
Patient [name] on nurse line and asked again about paperwork to life insurance, stating that "we never faxed anything over".  Per last phone note I asked her to call and have them fax something over.    Dr. Ardelia Mems, have you seen this?  If not, your next available is not until 03/22/19.  Any chance you would like to do a virtual before that to discuss the paperwork and the mini cog test results?Christen Bame, CMA

## 2019-03-05 NOTE — Telephone Encounter (Signed)
I have not received any info about life insurance documentation for patient. This does not sound urgent - please schedule her for my first available appointment. If she feels it needs to be sooner let me know and I will get in touch with her.  Thanks Leeanne Rio, MD

## 2019-03-08 NOTE — Telephone Encounter (Signed)
Called patient and she states that paperwork from her insurance company was to be faxed to [facility].  I verified that patient had correct fax number and she states that she will call agent to refax.  Patient has a virtual appointment on 03/11/2019 at 1610.  [name]

## 2019-03-11 ENCOUNTER — Other Ambulatory Visit: Payer: Self-pay

## 2019-03-11 ENCOUNTER — Telehealth (INDEPENDENT_AMBULATORY_CARE_PROVIDER_SITE_OTHER): Payer: Medicare Other | Admitting: Family Medicine

## 2019-03-11 DIAGNOSIS — I10 Essential (primary) hypertension: Secondary | ICD-10-CM

## 2019-03-11 DIAGNOSIS — K219 Gastro-esophageal reflux disease without esophagitis: Secondary | ICD-10-CM

## 2019-03-11 NOTE — Progress Notes (Signed)
[facility] Telemedicine Visit  Patient consented to have virtual visit. Method of visit: Telephone  Encounter participants: Patient: [name] - located at home Provider: Chrisandra Netters - located at office Others (if applicable): n/a  Chief Complaint: follow up  HPI:  Letter for insurance - trying to buy a life insurance policy and needs a letter describing her general health over the last 5 years. She is not able to give more specifics than that. There is no form she needs me to fill out.  History of hypertension in the past, not currently on any antihypertensives. Feeling well at home. When blood pressures have been checked they have been good.  GERD - taking omeprazole 20mg  daily, tolerating well, good control of symptoms.   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   Assessment/Plan:  GERD (gastroesophageal reflux disease) Well controlled. Continue current regimen.   HYPERTENSION, BENIGN SYSTEMIC Well controlled on prior checks. Continue off medications.   Will write letter describing her general health over the last 5 years. Patient will be contacted when it is ready to be picked up.   Time spent during visit with patient: 14 minutes

## 2019-03-16 ENCOUNTER — Telehealth: Payer: Self-pay | Admitting: Family Medicine

## 2019-03-16 ENCOUNTER — Encounter: Payer: Self-pay | Admitting: Family Medicine

## 2019-03-16 NOTE — Telephone Encounter (Signed)
Informed patient that letter is ready for pick up.  [name], [place]

## 2019-03-16 NOTE — Telephone Encounter (Signed)
Letter written as requested by patient. Will place at front desk. Red team, please contact patient to let her know it is ready.  Thanks Leeanne Rio, MD

## 2019-03-16 NOTE — Assessment & Plan Note (Signed)
Well-controlled.  Continue current regimen. 

## 2019-03-16 NOTE — Assessment & Plan Note (Signed)
Well controlled on prior checks. Continue off medications.

## 2019-04-08 ENCOUNTER — Ambulatory Visit (INDEPENDENT_AMBULATORY_CARE_PROVIDER_SITE_OTHER): Payer: Medicare Other | Admitting: Pulmonary Disease

## 2019-04-08 ENCOUNTER — Other Ambulatory Visit: Payer: Self-pay

## 2019-04-08 ENCOUNTER — Encounter: Payer: Self-pay | Admitting: Pulmonary Disease

## 2019-04-08 VITALS — BP 124/80 | HR 100 | Temp 97.7°F | Ht 64.0 in | Wt 228.6 lb

## 2019-04-08 DIAGNOSIS — I8290 Acute embolism and thrombosis of unspecified vein: Secondary | ICD-10-CM

## 2019-04-08 NOTE — Patient Instructions (Signed)
History of blood clots Tolerating blood thinners well  No changes  I will see you back in a year Call with any significant concerns

## 2019-04-08 NOTE — Progress Notes (Signed)
[name]    [id number]    October 12, 1945  Primary Care Physician:McIntyre, Delorse Limber, MD  Referring Physician: Leeanne Rio, [address] [place],  [place] [address]  Chief complaint:   1 year follow-up for venous thromboembolism, has been on anticoagulation Xarelto  HPI:  No interval changes in her health She was treated for pneumonia back in March, took about 5 weeks to recover Has no problems with Xarelto No bleeding No dark stools Continues to function well  She has lost about 20 more pounds since her last visit intentionally Good appetite Feels well generally  Outpatient Encounter Medications as of 04/08/2019  Medication Sig   atorvastatin (LIPITOR) 40 MG tablet Take 1 tablet (40 mg total) by mouth daily.   omeprazole (PRILOSEC) 20 MG capsule TAKE 1 CAPSULE BY MOUTH EVERY DAY (Patient taking differently: Take 20 mg by mouth daily. )   XARELTO 20 MG TABS tablet TAKE 1 TABLET BY MOUTH EVERY DAY   No facility-administered encounter medications on file as of 04/08/2019.     Allergies as of 04/08/2019   (No Known Allergies)    Past Medical History:  Diagnosis Date   Anemia    CKD (chronic kidney disease), stage III ([place])    pt doesn't know why this is listed   DJD (degenerative joint disease) of hip    GERD (gastroesophageal reflux disease)    h/o in the 1990s   Heart murmur    Hyperlipidemia    Hypertension    sees Dr. 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  had retroperiotneal hemorrahge with IVC filter placement after recurrent PE/DVT   Recurrent pulmonary embolism [facility])    see pulmonologist Dr. Asencion Noble   Sprain and strain of wrist     Past Surgical History:  Procedure Laterality Date   CARPAL TUNNEL RELEASE  09/02/2012   Procedure: CARPAL TUNNEL RELEASE;  Surgeon: Winfield Cunas, MD;  Location: [place]  NEURO ORS;  Service: Neurosurgery;  Laterality: Right;  RIGHT carpal tunnel release   DILATION AND CURETTAGE OF UTERUS  02/21/2012   Procedure: DILATATION AND CURETTAGE;  Surgeon: Emily Filbert, MD;  Location: [place] ORS;  Service: Gynecology;  Laterality: N/A;   Femoral vein ligation  12/2002   [place] filter  11/03/2003   R Jugular   PARTIAL HIP ARTHROPLASTY  Left, 02/2009   Dr Hal Morales   SHOULDER SURGERY  Left 07/15/08   Dr Hal Morales   TOTAL HIP ARTHROPLASTY  R 05/2203, L 2010   TRANSTHORACIC ECHOCARDIOGRAM     EF 55-60%, RV mod dilated, mildly increased LV wall thickness   TUBAL LIGATION  1981    Family History  Problem Relation Age of Onset   Heart disease Mother    Heart attack Mother    Heart disease Father    Heart attack Father    Cancer Brother    Cancer Sister    Alcohol abuse Sister     Social History   Socioeconomic History   Marital status: Widowed    Spouse name: Not on file   Number of children: 2   Years of education: College   Highest education level: Not on file  Occupational History    Employer: RETIRED  Social Needs   Financial resource strain: Not on file   Food insecurity  Worry: Not on file    Inability: Not on file   Transportation needs    Medical: Not on file    Non-medical: Not on file  Tobacco Use   Smoking status: Never Smoker   Smokeless tobacco: Never Used  Substance and Sexual Activity   Alcohol use: No   Drug use: No   Sexual activity: Not Currently    Birth control/protection: None  Lifestyle   Physical activity    Days per week: Not on file    Minutes per session: Not on file   Stress: Not on file  Relationships   Social connections    Talks on phone: Not on file    Gets together: Not on file    Attends religious service: Not on file    Active member of club or organization: Not on file    Attends meetings of clubs or organizations: Not on file    Relationship status: Not on file   Intimate  partner violence    Fear of current or ex partner: Not on file    Emotionally abused: Not on file    Physically abused: Not on file    Forced sexual activity: Not on file  Other Topics Concern   Not on file  Social History Narrative   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    Lives at home with  daughter born in '87.    Non smoker. Non alcohol drinker.;    Fosters children .             Review of Systems  Constitutional: Negative.   HENT: Negative.   Eyes: Negative.   Respiratory: Negative.   Cardiovascular: Negative.   Gastrointestinal: Negative.   Endocrine: Negative.   Genitourinary: Negative.     Vitals:   04/08/19 1033  BP: 124/80  Pulse: 100  Temp: 97.7 F (36.5 C)  SpO2: 98%     Physical Exam  Constitutional: She appears well-developed and well-nourished.  HENT:  Head: Normocephalic and atraumatic.  Eyes: Conjunctivae are normal. Right eye exhibits no discharge. Left eye exhibits no discharge.  Neck: Normal range of motion. Neck supple. No tracheal deviation present. No thyromegaly present.  Cardiovascular: Normal rate and regular rhythm.  Pulmonary/Chest: Effort normal and breath sounds normal. No respiratory distress. She has no wheezes. She has no rales.  Abdominal: Soft. Bowel sounds are normal. She exhibits no distension. There is no abdominal tenderness.     Data Reviewed: Records reviewed Last echo was in 2015-normal pulmonary pressures  Assessment:  History of PE/DVT -Stable on anticoagulation  Hypertension-stable  Osteoarthritis -Has pain and discomfort -Has had hips replaced -Declined knee surgery  Plan/Recommendations: Continue Xarelto  Encouraged physical activity as tolerated  I will see her back in a year Call with any concerns   Sherrilyn Rist MD [facility] 04/08/2019, 11:10 AM  CC: Leeanne Rio, MD

## 2019-05-05 ENCOUNTER — Other Ambulatory Visit: Payer: Self-pay | Admitting: Pulmonary Disease

## 2019-05-26 ENCOUNTER — Other Ambulatory Visit: Payer: Self-pay | Admitting: Family Medicine

## 2019-05-26 DIAGNOSIS — Z1231 Encounter for screening mammogram for malignant neoplasm of breast: Secondary | ICD-10-CM

## 2019-06-01 ENCOUNTER — Ambulatory Visit: Payer: Medicare Other

## 2019-07-06 ENCOUNTER — Other Ambulatory Visit: Payer: Self-pay

## 2019-07-06 DIAGNOSIS — Z20822 Contact with and (suspected) exposure to covid-19: Secondary | ICD-10-CM

## 2019-07-07 LAB — NOVEL CORONAVIRUS, NAA: SARS-CoV-2, NAA: NOT DETECTED

## 2019-07-16 ENCOUNTER — Ambulatory Visit: Payer: Medicare Other

## 2019-08-06 ENCOUNTER — Other Ambulatory Visit: Payer: Self-pay | Admitting: Family Medicine

## 2019-10-13 DIAGNOSIS — R69 Illness, unspecified: Secondary | ICD-10-CM | POA: Diagnosis not present

## 2019-10-13 DIAGNOSIS — M1990 Unspecified osteoarthritis, unspecified site: Secondary | ICD-10-CM | POA: Diagnosis not present

## 2019-10-13 DIAGNOSIS — Z8249 Family history of ischemic heart disease and other diseases of the circulatory system: Secondary | ICD-10-CM | POA: Diagnosis not present

## 2019-10-13 DIAGNOSIS — Z7901 Long term (current) use of anticoagulants: Secondary | ICD-10-CM | POA: Diagnosis not present

## 2019-10-13 DIAGNOSIS — K219 Gastro-esophageal reflux disease without esophagitis: Secondary | ICD-10-CM | POA: Diagnosis not present

## 2019-10-13 DIAGNOSIS — I2782 Chronic pulmonary embolism: Secondary | ICD-10-CM | POA: Diagnosis not present

## 2019-10-13 DIAGNOSIS — Z96649 Presence of unspecified artificial hip joint: Secondary | ICD-10-CM | POA: Diagnosis not present

## 2019-10-30 ENCOUNTER — Other Ambulatory Visit: Payer: Self-pay | Admitting: Pulmonary Disease

## 2019-11-04 ENCOUNTER — Telehealth: Payer: Self-pay | Admitting: *Deleted

## 2019-11-04 NOTE — Telephone Encounter (Signed)
Letter written. We need a release of info form signed by patient before we can fax her information to [facility].  Will return letter to [facility] RN team. Please ask her to come in and sign a release.  Please also ask patient to schedule an in-person follow up with me. She is behind on getting multiple health maintenance items and labs.  Thanks Leeanne Rio, MD

## 2019-11-04 NOTE — Telephone Encounter (Signed)
Larene Beach from [facility] care services called and needs a letter from Dr. Ardelia Mems.  Pt is requesting help with her disabled daughter.  In order for Larene Beach to process this she will need a letter from PCP stating what conditions the patient has that would make it hard for her to care for her daughter.  Shannons fax number is [phone]  To PCP. Christen Bame, CMA

## 2019-11-09 ENCOUNTER — Telehealth: Payer: Self-pay | Admitting: Family Medicine

## 2019-11-09 ENCOUNTER — Other Ambulatory Visit: Payer: Self-pay | Admitting: *Deleted

## 2019-11-09 NOTE — Telephone Encounter (Signed)
Nursing- please call patient and see if she needs this refill. Recently refilled by Dr. Ander Slade.   Let me know if she still needs this.  Thank you,  Dorris Singh, MD  [facility] Medicine Teaching Service

## 2019-11-09 NOTE — Telephone Encounter (Signed)
There is a letter from Dr. Ardelia Mems from last week, it appears we are awaiting a ROI to fax.   I have printed a new letter, signed, and placed at the front for pick up. Letter is also in letters tab.   Nursing/Administrative staff: Did we receive this ROI?  Please let me know if additional information is needed.   Dorris Singh, MD  Family Medicine Teaching Service

## 2019-11-09 NOTE — Telephone Encounter (Signed)
Larene Beach calling nurse line regarding follow up on receiving letter for patient. Informed that patient will need to sign release of information and we will fax over the letter as soon as possible.   Talbot Grumbling, RN

## 2019-11-09 NOTE — Telephone Encounter (Signed)
Patient contacted and informed of letter written and informed she will need to sign a ROI for [place] to fax to [facility]. Patient will come by this afternoon. Please return back to [place] for processing. Patient scheduled for 4/22 with PCP for HM.

## 2019-11-09 NOTE — Telephone Encounter (Signed)
Pt came into office today stating she is needing a note for medicaid. She said the note needs to state that she is unable to preform physical activities with her down syndrome daughter, such as taking her to the park, museum, and things like that, that she is unable to walk well, so that way they can continue getting nurse care to be able to take her places like that. Medicaid will not continue services without doctors note stating her disability. Pt is going to call back with fax number for [place] to fax when complete. Also wants copy up front to pick up. Any questions please ask. Thanks

## 2019-11-10 MED ORDER — RIVAROXABAN 20 MG PO TABS: 20.0000 mg | ORAL_TABLET | Freq: Every day | ORAL | 0 refills | Status: DC

## 2019-11-10 NOTE — Telephone Encounter (Signed)
Called and spoke to patient concerning Xarelto.  Patient states that she is aware that RX was filled by another doctor but that she is almost out now and she wants  Dr. Ardelia Mems to refill it please.  Patient states that she does have an upcoming appointment with Ardelia Mems 11/25/2019 at 1010.  [name], [place]

## 2019-11-10 NOTE — Telephone Encounter (Signed)
Rx refilled for 90 days.   Thank you, Dorris Singh, MD  [facility] Medicine Teaching Service

## 2019-11-10 NOTE — Addendum Note (Signed)
Addended by: Owens Shark, Delesia Martinek on: 11/10/2019 01:27 PM   Modules accepted: Orders

## 2019-11-16 ENCOUNTER — Other Ambulatory Visit: Payer: Self-pay | Admitting: *Deleted

## 2019-11-17 MED ORDER — RIVAROXABAN 20 MG PO TABS: 20.0000 mg | ORAL_TABLET | Freq: Every day | ORAL | 0 refills | Status: DC

## 2019-11-25 ENCOUNTER — Other Ambulatory Visit: Payer: Self-pay

## 2019-11-25 ENCOUNTER — Encounter: Payer: Self-pay | Admitting: Family Medicine

## 2019-11-25 ENCOUNTER — Ambulatory Visit (INDEPENDENT_AMBULATORY_CARE_PROVIDER_SITE_OTHER): Payer: Medicare HMO | Admitting: Family Medicine

## 2019-11-25 VITALS — BP 131/85 | HR 88 | Wt 222.8 lb

## 2019-11-25 DIAGNOSIS — Z1211 Encounter for screening for malignant neoplasm of colon: Secondary | ICD-10-CM | POA: Diagnosis not present

## 2019-11-25 DIAGNOSIS — E785 Hyperlipidemia, unspecified: Secondary | ICD-10-CM

## 2019-11-25 DIAGNOSIS — K219 Gastro-esophageal reflux disease without esophagitis: Secondary | ICD-10-CM | POA: Diagnosis not present

## 2019-11-25 DIAGNOSIS — Z8679 Personal history of other diseases of the circulatory system: Secondary | ICD-10-CM | POA: Diagnosis not present

## 2019-11-25 DIAGNOSIS — Z1231 Encounter for screening mammogram for malignant neoplasm of breast: Secondary | ICD-10-CM | POA: Diagnosis not present

## 2019-11-25 DIAGNOSIS — Z7901 Long term (current) use of anticoagulants: Secondary | ICD-10-CM

## 2019-11-25 DIAGNOSIS — I8290 Acute embolism and thrombosis of unspecified vein: Secondary | ICD-10-CM

## 2019-11-25 MED ORDER — TETANUS-DIPHTH-ACELL PERTUSSIS 5-2.5-18.5 LF-MCG/0.5 IM SUSP: 0.5000 mL | Freq: Once | INTRAMUSCULAR | 0 refills | Status: AC

## 2019-11-25 NOTE — Assessment & Plan Note (Signed)
Doing well on xarelto Check CBC today for routine monitoring

## 2019-11-25 NOTE — Assessment & Plan Note (Signed)
Check lipids today Continue atorvastatin

## 2019-11-25 NOTE — Assessment & Plan Note (Signed)
Stable off medications presently

## 2019-11-25 NOTE — Progress Notes (Signed)
error 

## 2019-11-25 NOTE — Assessment & Plan Note (Signed)
Remains off medications with good blood pressure control Praised her efforts at healthy eating and weight loss

## 2019-11-25 NOTE — Progress Notes (Signed)
  Date of Visit: 11/25/2019   SUBJECTIVE:   HPI:  [name] presents today for routine follow up.  Hyperlipidemia - taking atorvastatin 40mg  daily, tolerating well. Fasting today  History of PE - on xarelto 20mg  daily, tolerating well, no bleeding  GERD - previously on omeprazole, no longer on this medication, doing well  Has lost weight over the last year, working on eating healthier and having smaller portions. History of hypertension but not presently on any medications for this  OBJECTIVE:   BP 131/85   Pulse 88   Wt 222 lb 12.8 oz (101.1 kg)   SpO2 95%   BMI 38.24 kg/m  Gen: no acute distress, pleasant, cooperative HEENT: normocephalic, atraumatic  Heart: regular rate and rhythm, no murmur Lungs: clear to auscultation bilaterally, normal work of breathing  Neuro: alert, speech normal, grossly nonfocal Ext: No appreciable lower extremity edema bilaterally   ASSESSMENT/PLAN:   Health maintenance:  -given rx for Tdap vaccine -completed COVID vaccine series (last dose 3/24, received in [place]) -refer to GI for colonoscopy, patient agreeable -mammogram ordered & given handout on how to schedule  Hyperlipidemia Check lipids today Continue atorvastatin  History of hypertension Remains off medications with good blood pressure control Praised her efforts at healthy eating and weight loss  VTE (venous thromboembolism) Doing well on xarelto Check CBC today for routine monitoring  GERD (gastroesophageal reflux disease) Stable off medications presently  FOLLOW UP: Follow up in 1 year for next routine visit, sooner if needed Referred to GI for colonoscopy  [place] J. Ardelia Mems, [place]

## 2019-11-25 NOTE — Patient Instructions (Signed)
It was great to see you again today!  See the handout on how to schedule your mammogram. This is an important test to screen for breast cancer.  Referring to GI for colonoscopy  Stay on current medications  Checking labs today  Go get yoru tetanus shot  Follow up with me in 1 year, sooner if any issues  Be well, Dr. Ardelia Mems

## 2019-11-26 LAB — CBC
Hematocrit: 40.2 % (ref 34.0–46.6)
Hemoglobin: 13.4 g/dL (ref 11.1–15.9)
MCH: 28.7 pg (ref 26.6–33.0)
MCHC: 33.3 g/dL (ref 31.5–35.7)
MCV: 86 fL (ref 79–97)
Platelets: 322 10*3/uL (ref 150–450)
RBC: 4.67 x10E6/uL (ref 3.77–5.28)
RDW: 14.2 % (ref 11.7–15.4)
WBC: 6.1 10*3/uL (ref 3.4–10.8)

## 2019-11-26 LAB — CMP14+EGFR
ALT: 9 IU/L (ref 0–32)
AST: 14 IU/L (ref 0–40)
Albumin/Globulin Ratio: 1.6 (ref 1.2–2.2)
Albumin: 4.5 g/dL (ref 3.7–4.7)
Alkaline Phosphatase: 83 IU/L (ref 39–117)
BUN/Creatinine Ratio: 15 (ref 12–28)
BUN: 15 mg/dL (ref 8–27)
Bilirubin Total: 0.3 mg/dL (ref 0.0–1.2)
CO2: 25 mmol/L (ref 20–29)
Calcium: 10.1 mg/dL (ref 8.7–10.3)
Chloride: 103 mmol/L (ref 96–106)
Creatinine, Ser: 1.03 mg/dL — ABNORMAL HIGH (ref 0.57–1.00)
GFR calc Af Amer: 62 mL/min/{1.73_m2} (ref >59)
GFR calc non Af Amer: 54 mL/min/{1.73_m2} — ABNORMAL LOW (ref >59)
Globulin, Total: 2.9 g/dL (ref 1.5–4.5)
Glucose: 72 mg/dL (ref 65–99)
Potassium: 4.7 mmol/L (ref 3.5–5.2)
Sodium: 142 mmol/L (ref 134–144)
Total Protein: 7.4 g/dL (ref 6.0–8.5)

## 2019-11-26 LAB — LIPID PANEL
Chol/HDL Ratio: 3.5 ratio (ref 0.0–4.4)
Cholesterol, Total: 247 mg/dL — ABNORMAL HIGH (ref 100–199)
HDL: 70 mg/dL (ref >39)
LDL Chol Calc (NIH): 156 mg/dL — ABNORMAL HIGH (ref 0–99)
Triglycerides: 120 mg/dL (ref 0–149)
VLDL Cholesterol Cal: 21 mg/dL (ref 5–40)

## 2019-11-30 ENCOUNTER — Encounter: Payer: Self-pay | Admitting: Physician Assistant

## 2019-12-10 ENCOUNTER — Encounter: Payer: Self-pay | Admitting: Family Medicine

## 2019-12-13 ENCOUNTER — Ambulatory Visit: Payer: Medicare HMO | Admitting: Physician Assistant

## 2019-12-29 ENCOUNTER — Ambulatory Visit: Payer: Medicare HMO | Admitting: Nurse Practitioner

## 2020-01-06 ENCOUNTER — Telehealth: Payer: Self-pay

## 2020-01-06 DIAGNOSIS — M25562 Pain in left knee: Secondary | ICD-10-CM

## 2020-01-06 NOTE — Telephone Encounter (Signed)
Patient calls nurse line requesting two DME orders. Patient requesting order for DME shower chair and DME toilet seat. Requesting orders be sent to [facility].   Please let "RN team" know when orders are placed so they can be processed.   To PCP  Talbot Grumbling, RN

## 2020-01-14 NOTE — Telephone Encounter (Signed)
Apologies for the delay in response  I have placed both orders  Thanks Leeanne Rio, MD

## 2020-01-17 NOTE — Telephone Encounter (Signed)
Community message sent to [employer]. Will await response,   Talbot Grumbling, RN

## 2020-01-17 NOTE — Telephone Encounter (Signed)
Please see below message from [place], [place] it. Thanks  Talbot Grumbling, RN

## 2020-01-23 NOTE — Progress Notes (Deleted)
01/23/2020 [name] [id number] Dec 05, 1945   CHIEF COMPLAINT:   HISTORY OF PRESENT ILLNESS:  [name] is a 74 year old female with a past medical history of hypertension, hyperlipidemia, recurrent PE 1997 and 2005 s/p IVC filter previously on Coumadin the switched to [place] 2013 and GERD. She was referred to our office by Dr. Ardelia Mems to schedule a screening colonoscopy.   Pulmonary embollii in 1997 with recurrence in 2005 when coumadin dosing was adjusted and INR fell. Pt had IVC filter placed in 2005 with bleeding complications. Pt did well and has been on coumadin since that time.   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 Pt is limited in ambulation.  The patient was switched to Xarelto  summer 2013  Received Covid vaccination  ECHO 01/14/2014: - Left ventricle: The cavity size was normal. Systolic function was  normal. The estimated ejection fraction was in the range of 55%  to 60%. Wall motion was normal; there were no regional wall  motion abnormalities. Doppler parameters are consistent with  abnormal left ventricular relaxation (grade 1 diastolic  dysfunction).  - Atrial septum: No defect or patent foramen ovale was identified.    Past Medical History:  Diagnosis Date  . Anemia   . CKD (chronic kidney disease), stage III    pt doesn't know why this is listed  . DJD (degenerative joint disease) of hip   . GERD (gastroesophageal reflux disease)    h/o in the 1990s  . Heart murmur   . Hyperlipidemia   . Hypertension    sees Dr. Gilda Crease  . Obesity (BMI 30-39.9)   . 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 Recurrent pulmonary embolism [facility])    see pulmonologist Dr. Asencion Noble  . Sprain and strain of  wrist    Past Surgical History:  Procedure Laterality Date  . CARPAL TUNNEL RELEASE  09/02/2012   Procedure: CARPAL TUNNEL RELEASE;  Surgeon: Winfield Cunas, MD;  Location: [place] NEURO ORS;  Service: Neurosurgery;  Laterality: Right;  RIGHT carpal tunnel release  . DILATION AND CURETTAGE OF UTERUS  02/21/2012   Procedure: DILATATION AND CURETTAGE;  Surgeon: Emily Filbert, MD;  Location: [place] ORS;  Service: Gynecology;  Laterality: N/A;  . Femoral vein ligation  12/2002  . [place] filter  11/03/2003   R Jugular  . PARTIAL HIP ARTHROPLASTY  Left, 02/2009   Dr Hal Morales  . SHOULDER SURGERY  Left 07/15/08   Dr Hal Morales  . TOTAL HIP ARTHROPLASTY  R 05/2203, L 2010  . TRANSTHORACIC ECHOCARDIOGRAM     EF 55-60%, RV mod dilated, mildly increased LV wall thickness  . TUBAL LIGATION  1981    Social History:  Family History:    reports that she has never smoked. She has never used smokeless tobacco. She reports that she does not drink alcohol and does not use drugs. family history includes Alcohol abuse in her sister; Cancer in her brother and sister; Heart attack in her father and mother; Heart disease in her father and mother. No Known Allergies    Outpatient Encounter Medications as of 01/25/2020  Medication Sig  . atorvastatin (LIPITOR) 40 MG tablet TAKE 1 TABLET BY MOUTH EVERY DAY  . rivaroxaban (XARELTO) 20 MG TABS tablet Take 1  tablet (20 mg total) by mouth daily.   No facility-administered encounter medications on file as of 01/25/2020.     REVIEW OF SYSTEMS: All other systems reviewed and negative except where noted in the History of Present Illness.  Gen: Denies fever, sweats or chills. No weight loss.  CV: Denies chest pain, palpitations or edema. Resp: Denies cough, shortness of breath of hemoptysis.  GI: Denies heartburn, dysphagia, stomach or lower abdominal pain. No diarrhea or constipation.  GU : Denies urinary burning, blood in urine, increased urinary frequency or incontinence. MS:  Denies joint pain, muscles aches or weakness. Derm: Denies rash, itchiness, skin lesions or unhealing ulcers. Psych: Denies depression, anxiety, memory loss, suicidal ideation and confusion. Heme: Denies bruising, bleeding. Neuro:  Denies headaches, dizziness or paresthesias. Endo:  Denies any problems with DM, thyroid or adrenal function.    PHYSICAL EXAM: There were no vitals taken for this visit. General: Well developed ... in no acute distress. Head: Normocephalic and atraumatic. Eyes:  Sclerae non-icteric, conjunctive pink. Ears: Normal auditory acuity. Mouth: Dentition intact. No ulcers or lesions.  Neck: Supple, no lymphadenopathy or thyromegaly.  Lungs: Clear bilaterally to auscultation without wheezes, crackles or rhonchi. Heart: Regular rate and rhythm. No murmur, rub or gallop appreciated.  Abdomen: Soft, nontender, non distended. No masses. No hepatosplenomegaly. Normoactive bowel sounds x 4 quadrants.  Rectal:  Musculoskeletal: Symmetrical with no gross deformities. Skin: Warm and dry. No rash or lesions on visible extremities. Extremities: No edema. Neurological: Alert oriented x 4, no focal deficits.  Psychological:  Alert and cooperative. Normal mood and affect.  ASSESSMENT AND PLAN:  50. 74 year old female presents to schedule a screening colonoscopy  2. History of recurrent PE/DVT on Xarelto     CC:  Leeanne Rio, MD

## 2020-01-25 ENCOUNTER — Ambulatory Visit: Payer: Medicare HMO | Admitting: Nurse Practitioner

## 2020-03-28 ENCOUNTER — Ambulatory Visit: Payer: Medicare HMO | Admitting: Family Medicine

## 2020-04-18 ENCOUNTER — Ambulatory Visit: Payer: Medicare HMO | Admitting: Family Medicine

## 2020-04-19 ENCOUNTER — Other Ambulatory Visit: Payer: Self-pay | Admitting: *Deleted

## 2020-04-21 ENCOUNTER — Other Ambulatory Visit: Payer: Self-pay | Admitting: Family Medicine

## 2020-04-21 MED ORDER — RIVAROXABAN 20 MG PO TABS: 20.0000 mg | ORAL_TABLET | Freq: Every day | ORAL | 0 refills | Status: DC

## 2020-05-02 ENCOUNTER — Encounter: Payer: Self-pay | Admitting: Family Medicine

## 2020-05-02 ENCOUNTER — Other Ambulatory Visit: Payer: Self-pay

## 2020-05-02 ENCOUNTER — Ambulatory Visit (INDEPENDENT_AMBULATORY_CARE_PROVIDER_SITE_OTHER): Payer: Medicare HMO | Admitting: Family Medicine

## 2020-05-02 VITALS — BP 148/88 | HR 98 | Ht 64.0 in | Wt 209.0 lb

## 2020-05-02 DIAGNOSIS — R634 Abnormal weight loss: Secondary | ICD-10-CM

## 2020-05-02 LAB — POCT URINALYSIS DIP (MANUAL ENTRY)
Glucose, UA: NEGATIVE mg/dL
Nitrite, UA: NEGATIVE
Protein Ur, POC: 100 mg/dL — AB
Spec Grav, UA: 1.025 (ref 1.010–1.025)
Urobilinogen, UA: 1 E.U./dL
pH, UA: 5.5 (ref 5.0–8.0)

## 2020-05-02 LAB — POCT UA - MICROSCOPIC ONLY

## 2020-05-02 LAB — POCT GLYCOSYLATED HEMOGLOBIN (HGB A1C): Hemoglobin A1C: 5.5 % (ref 4.0–5.6)

## 2020-05-02 NOTE — Patient Instructions (Addendum)
It was great to see you again today!  We are doing a lot of testing to figure out why you are losing weight.  Blood and urine tests will take a day or two to come back. Go get chest xray, also CT scan of your abdomen.  Please schedule your mammogram and call the GI office to reschedule your appointment for colonoscopy. [phone]  Flu shot today.  Follow up with me in 1 month, sooner if needed  Be well, Dr. Ardelia Mems

## 2020-05-03 LAB — CMP14+EGFR
ALT: 7 IU/L (ref 0–32)
AST: 18 IU/L (ref 0–40)
Albumin/Globulin Ratio: 1.5 (ref 1.2–2.2)
Albumin: 4.4 g/dL (ref 3.7–4.7)
Alkaline Phosphatase: 74 IU/L (ref 44–121)
BUN/Creatinine Ratio: 12 (ref 12–28)
BUN: 12 mg/dL (ref 8–27)
Bilirubin Total: 0.4 mg/dL (ref 0.0–1.2)
CO2: 22 mmol/L (ref 20–29)
Calcium: 9.8 mg/dL (ref 8.7–10.3)
Chloride: 103 mmol/L (ref 96–106)
Creatinine, Ser: 0.97 mg/dL (ref 0.57–1.00)
GFR calc Af Amer: 67 mL/min/{1.73_m2} (ref >59)
GFR calc non Af Amer: 58 mL/min/{1.73_m2} — ABNORMAL LOW (ref >59)
Globulin, Total: 2.9 g/dL (ref 1.5–4.5)
Glucose: 94 mg/dL (ref 65–99)
Potassium: 4.2 mmol/L (ref 3.5–5.2)
Sodium: 144 mmol/L (ref 134–144)
Total Protein: 7.3 g/dL (ref 6.0–8.5)

## 2020-05-03 LAB — CBC WITH DIFFERENTIAL/PLATELET
Basophils Absolute: 0 10*3/uL (ref 0.0–0.2)
Basos: 1 %
EOS (ABSOLUTE): 0 10*3/uL (ref 0.0–0.4)
Eos: 0 %
Hematocrit: 40.9 % (ref 34.0–46.6)
Hemoglobin: 13.5 g/dL (ref 11.1–15.9)
Immature Grans (Abs): 0 10*3/uL (ref 0.0–0.1)
Immature Granulocytes: 0 %
Lymphocytes Absolute: 2.5 10*3/uL (ref 0.7–3.1)
Lymphs: 34 %
MCH: 29 pg (ref 26.6–33.0)
MCHC: 33 g/dL (ref 31.5–35.7)
MCV: 88 fL (ref 79–97)
Monocytes Absolute: 0.4 10*3/uL (ref 0.1–0.9)
Monocytes: 5 %
Neutrophils Absolute: 4.3 10*3/uL (ref 1.4–7.0)
Neutrophils: 60 %
Platelets: 328 10*3/uL (ref 150–450)
RBC: 4.66 x10E6/uL (ref 3.77–5.28)
RDW: 14.3 % (ref 11.7–15.4)
WBC: 7.2 10*3/uL (ref 3.4–10.8)

## 2020-05-03 LAB — TSH: TSH: 1.74 u[IU]/mL (ref 0.450–4.500)

## 2020-05-03 LAB — SEDIMENTATION RATE: Sed Rate: 36 mm/hr (ref 0–40)

## 2020-05-03 LAB — HIV ANTIBODY (ROUTINE TESTING W REFLEX): HIV Screen 4th Generation wRfx: NONREACTIVE

## 2020-05-03 LAB — C-REACTIVE PROTEIN: CRP: <1 mg/L (ref 0–10)

## 2020-05-03 NOTE — Progress Notes (Signed)
    SUBJECTIVE:   CHIEF COMPLAINT / HPI:   Unintentional weight loss Patient reports little to no appetite in the past three months, sometimes going days (up to three days) without eating. She is often just eating out of necessity, and endorses mostly able to eat just ~2/3rds of an average-to-small sized plate of food when she forces herself to. She takes solely a large coffee, and egg and bacon english muffin on many days. She cooks in the evening, and tries to eat some vegetables at times. Weight loss of ~14 lbs since last visit in April. Family history significant for Colon cancer in sister diagnosed at age 49. Denies difficulty swallowing, melena, hematochezia, abdominal pain, diarrhea, constipation. Denies fevers and chills. Denies drainage from nipples or skin changes to breasts. She has not yet gone for her colonoscopy and mammogram.  Treated for Community Acquired Pnuemonia in March 2020. Hx of D&C for post-menopausal bleeding  ~8 years ago. She denies vaginal bleeding. Never smoker.  PERTINENT  PMH / PSH: VTE, GERD, Osteoarthritis, CKD, HLD   OBJECTIVE:   BP (!) 148/88   Pulse 98   Ht $R'5\' 4"'oD$  (1.626 m)   Wt 209 lb (94.8 kg)   SpO2 97%   BMI 35.87 kg/m   General: No acute distress. Pleasant and cooperative.  Pulmonary: Normal work of breathing.Clear to auscultation bilaterally Breast: WNL. No evidence of cutaneous change or significant firm masses palpated.    Abdomen: soft, nontender to palpation, no masses Neuro: Alert and responsive. Grossly non-focal.   ASSESSMENT/PLAN:   Health maintenance -has received both COVID vaccines -past due for both colonoscopy and mammogram. Advised on how to get these (especially in light of unintentional weight loss) -flu shot today  Unintentional weight loss Patient with anorexia of three months duration (sometimes going days without eating), and family hx significant for Colon cancer in sister diagnosed at age 54. Unintentional weight  loss of ~14 lbs since last visit in April.  -Need to update her age-appropriate cancer screenings. Patient strongly advised to schedule mammogram, and re-schedule colonoscopy -check labs: Hb A1C, ESR, CRP, CBC, CMP, HIV antibody, urinalysis  -CT Abdomen pelvis with contrast scheduled for October 7 given anorexia and weight loss  -Chest x-ray  -f/u in one month, sooner if needed   Patient seen along with MS3 student Romeo Apple. I personally evaluated this patient along with the student, and verified all aspects of the history, physical exam, and medical decision making as documented by the student. I agree with the student's documentation and have made all necessary edits.  Chrisandra Netters, MD  [facility]

## 2020-05-04 ENCOUNTER — Encounter: Payer: Self-pay | Admitting: Nurse Practitioner

## 2020-05-07 DIAGNOSIS — R634 Abnormal weight loss: Secondary | ICD-10-CM | POA: Insufficient documentation

## 2020-05-07 LAB — URINE CULTURE

## 2020-05-07 NOTE — Assessment & Plan Note (Signed)
Patient with anorexia of three months duration (sometimes going days without eating), and family hx significant for Colon cancer in sister diagnosed at age 74. Unintentional weight loss of ~14 lbs since last visit in April.  -Need to update her age-appropriate cancer screenings. Patient strongly advised to schedule mammogram, and re-schedule colonoscopy -check labs: Hb A1C, ESR, CRP, CBC, CMP, HIV antibody, urinalysis  -CT Abdomen pelvis with contrast scheduled for October 7 given anorexia and weight loss  -Chest x-ray  -f/u in one month, sooner if needed

## 2020-05-11 ENCOUNTER — Encounter (HOSPITAL_BASED_OUTPATIENT_CLINIC_OR_DEPARTMENT_OTHER): Payer: Self-pay

## 2020-05-11 ENCOUNTER — Other Ambulatory Visit: Payer: Self-pay

## 2020-05-11 ENCOUNTER — Ambulatory Visit (HOSPITAL_BASED_OUTPATIENT_CLINIC_OR_DEPARTMENT_OTHER)
Admission: RE | Admit: 2020-05-11 | Discharge: 2020-05-11 | Disposition: A | Discharge status: Home / Self Care | Payer: Medicare HMO | Source: Ambulatory Visit | Attending: Family Medicine | Admitting: Family Medicine

## 2020-05-11 ENCOUNTER — Telehealth: Payer: Self-pay | Admitting: Family Medicine

## 2020-05-11 DIAGNOSIS — R634 Abnormal weight loss: Secondary | ICD-10-CM | POA: Diagnosis not present

## 2020-05-11 DIAGNOSIS — R630 Anorexia: Secondary | ICD-10-CM | POA: Diagnosis not present

## 2020-05-11 DIAGNOSIS — R935 Abnormal findings on diagnostic imaging of other abdominal regions, including retroperitoneum: Secondary | ICD-10-CM

## 2020-05-11 DIAGNOSIS — K5730 Diverticulosis of large intestine without perforation or abscess without bleeding: Secondary | ICD-10-CM | POA: Diagnosis not present

## 2020-05-11 DIAGNOSIS — K449 Diaphragmatic hernia without obstruction or gangrene: Secondary | ICD-10-CM | POA: Diagnosis not present

## 2020-05-11 MED ORDER — IOHEXOL 300 MG/ML  SOLN
100.0000 mL | Freq: Once | INTRAMUSCULAR | Status: AC | PRN
  Administered 2020-05-11: 100 mL via INTRAVENOUS

## 2020-05-11 NOTE — Telephone Encounter (Signed)
Attempted to reach patient to discuss labs, specifically urine culture which grew Klebsiella. May represent asymptomatic colonization, but may also be worth treating in light of her unintentional weight loss.  Called patient x3, no answer, says phone is not in service.  Will try again later.  Leeanne Rio, MD

## 2020-05-12 MED ORDER — CEPHALEXIN 500 MG PO CAPS: 500.0000 mg | ORAL_CAPSULE | Freq: Two times a day (BID) | ORAL | 0 refills | Status: DC

## 2020-05-12 NOTE — Telephone Encounter (Signed)
Patient called back and I reached her. Updated her phone number in our records.  Discussed labs She denies any dysuria, hesitancy, frequency, pelvic pain, fevers, or back pain.  It is possible this is a smoldering UTI causing decreased appetite and weight loss. Probably less likely, but I think the benefits of treating outweigh the risks. Gave option of treating with antibiotic, she is agreeable. Sent in rx for keflex 500mg  twice daily x7 days.  Also reviewed CT results with her. Scan showed fibroid vs endometrial thickening in uterus. Needs pelvic u/s, order placed.   Red team, can you schedule pelvic u/s and contact patient with an appointment?  Thanks! Leeanne Rio, MD

## 2020-05-12 NOTE — Telephone Encounter (Signed)
Attempted again to reach patient, still says phone not in service.  Called emergency contact listed, son [name], who provided new phone number for patient: [phone].  Attempted to call that number, no answer. Left HIPAA compliant voicemail asking patient to call back  Sharon Rio, MD

## 2020-05-22 NOTE — Telephone Encounter (Signed)
Contacted patient to verify that she was aware of appointment.   She confirmed and states that her son will take her.  [name], [place]

## 2020-05-25 ENCOUNTER — Other Ambulatory Visit: Payer: Self-pay

## 2020-05-25 ENCOUNTER — Telehealth: Payer: Self-pay | Admitting: Family Medicine

## 2020-05-25 ENCOUNTER — Ambulatory Visit
Admission: RE | Admit: 2020-05-25 | Discharge: 2020-05-25 | Disposition: A | Discharge status: Home / Self Care | Payer: Medicare HMO | Source: Ambulatory Visit | Attending: Family Medicine | Admitting: Family Medicine

## 2020-05-25 DIAGNOSIS — R9389 Abnormal findings on diagnostic imaging of other specified body structures: Secondary | ICD-10-CM | POA: Diagnosis not present

## 2020-05-25 DIAGNOSIS — N858 Other specified noninflammatory disorders of uterus: Secondary | ICD-10-CM | POA: Diagnosis not present

## 2020-05-25 DIAGNOSIS — R935 Abnormal findings on diagnostic imaging of other abdominal regions, including retroperitoneum: Secondary | ICD-10-CM | POA: Diagnosis not present

## 2020-05-25 DIAGNOSIS — N854 Malposition of uterus: Secondary | ICD-10-CM | POA: Diagnosis not present

## 2020-05-25 DIAGNOSIS — R634 Abnormal weight loss: Secondary | ICD-10-CM | POA: Diagnosis not present

## 2020-05-25 DIAGNOSIS — R1909 Other intra-abdominal and pelvic swelling, mass and lump: Secondary | ICD-10-CM | POA: Diagnosis not present

## 2020-05-25 NOTE — Telephone Encounter (Signed)
Received a call on the after hours emergency line from the radiologist regarding pelvic ultrasound performed today that showed likely endometrial carcinoma. Nothing emergent to do tonight. Will forward to PCP so that they may discuss the results with the patient.

## 2020-05-29 ENCOUNTER — Telehealth: Payer: Self-pay | Admitting: Family Medicine

## 2020-05-29 NOTE — Telephone Encounter (Signed)
Called patient to ask her to come in to discuss result of pelvic u/s. She is unable to come in to the office until Wednesday. We scheduled an appointment for Wed AM at [facility], which is the soonest she can come in.  Patient did not ask for results over the phone, so I decided to wait until we can discuss in person. U/S shows likely endometrial cancer. I will plan to discuss endometrial biopsy with her on Wednesday.   Confirmed with patient that she takes her xarelto at night. If she is amenable to biopsy, we will have her hold xarelto Wed night and come back Thurs AM for biopsy in colpo clinic. I have a spot in that clinic on hold for her.  Leeanne Rio, MD

## 2020-05-31 ENCOUNTER — Ambulatory Visit (INDEPENDENT_AMBULATORY_CARE_PROVIDER_SITE_OTHER): Payer: Medicare HMO | Admitting: Family Medicine

## 2020-05-31 ENCOUNTER — Encounter: Payer: Self-pay | Admitting: Family Medicine

## 2020-05-31 ENCOUNTER — Other Ambulatory Visit: Payer: Self-pay

## 2020-05-31 VITALS — BP 160/85 | HR 92 | Wt 209.8 lb

## 2020-05-31 DIAGNOSIS — R9389 Abnormal findings on diagnostic imaging of other specified body structures: Secondary | ICD-10-CM

## 2020-05-31 NOTE — Progress Notes (Signed)
  Date of Visit: 05/31/2020   [name] presents today to discuss results of her pelvic ultrasound. She is accompanied by her son. They share a birthday, which is today.  U/S showed markedly thickened endometrial complex (11mm) with heterogenous echotexture, internal microcystic change, and vascularity - all findings concerning for endometrial carcinoma.   Long discussion with patient and her son reviewing the reasons we initiated this workup (unexplained weight loss), the specific findings on her u/s, and recommended next steps (endometrial biopsy). If biopsy confirms endometrial carcinoma, will need referral to gynecologic oncology.  Endometrial biopsy scheduled tomorrow morning at 10:50am in colpo clinic with Dr. Nori Riis, who is aware of patient's case. Advised patient not to take xarelto tonight prior to procedure tomorrow morning. Recommended she discuss with Dr. Nori Riis at tomorrow's visit whether she can resume xarelto tomorrow night.  Patient also has an appointment with GI on Friday to discuss colonoscopy, which I would likely still recommend as the ultrasound findings may be red herring; concern she could also have something else going on, especially in light of her family history of colon cancer.  Patient and son appreciative. Offered support and encouraged them to call if they think of new questions. I will be happy to speak with them on the phone or again in person if needed.  [place]. Ardelia Mems, [place] than 15 minutes were spent on this encounter on the day of service, including pre-visit planning, actual face to face time, coordination of care, and documentation of visit.

## 2020-05-31 NOTE — Patient Instructions (Signed)
Do not take your xarelto tonight. Come back at 10:50 tomorrow morning for biopsy. Call with any questions you think of.  Dr. Ardelia Mems

## 2020-06-01 ENCOUNTER — Other Ambulatory Visit (HOSPITAL_COMMUNITY)
Admission: RE | Admit: 2020-06-01 | Discharge: 2020-06-01 | Disposition: A | Discharge status: Home / Self Care | Payer: Medicare HMO | Source: Ambulatory Visit | Attending: Family Medicine | Admitting: Family Medicine

## 2020-06-01 ENCOUNTER — Ambulatory Visit (INDEPENDENT_AMBULATORY_CARE_PROVIDER_SITE_OTHER): Payer: Medicare HMO | Admitting: Family Medicine

## 2020-06-01 VITALS — BP 186/90 | HR 67 | Wt 208.0 lb

## 2020-06-01 DIAGNOSIS — R9389 Abnormal findings on diagnostic imaging of other specified body structures: Secondary | ICD-10-CM | POA: Insufficient documentation

## 2020-06-01 DIAGNOSIS — N950 Postmenopausal bleeding: Secondary | ICD-10-CM

## 2020-06-01 NOTE — Patient Instructions (Addendum)
We were able to collect a small sample of your endometrium today. We will reach out to you likely early next week with the results. Please restart your Xarelto tomorrow.   Endometrial Biopsy, Care After This sheet gives you information about how to care for yourself after your procedure. Your health care provider may also give you more specific instructions. If you have problems or questions, contact your health care provider. What can I expect after the procedure? After the procedure, it is common to have:  Mild cramping.  A small amount of vaginal bleeding for a few days. This is normal. Follow these instructions at home:   Take over-the-counter and prescription medicines only as told by your health care provider.  Do not douche, use tampons, or have sexual intercourse until your health care provider approves.  Return to your normal activities as told by your health care provider. Ask your health care provider what activities are safe for you.  Follow instructions from your health care provider about any activity restrictions, such as restrictions on strenuous exercise or heavy lifting. Contact a health care provider if:  You have heavy bleeding, or bleed for longer than 2 days after the procedure.  You have bad smelling discharge from your vagina.  You have a fever or chills.  You have a burning sensation when urinating or you have difficulty urinating.  You have severe pain in your lower abdomen. Get help right away if:  You have severe cramps in your stomach or back.  You pass large blood clots.  Your bleeding increases.  You become weak or light-headed, or you pass out. Summary  After the procedure, it is common to have mild cramping and a small amount of vaginal bleeding for a few days.  Do not douche, use tampons, or have sexual intercourse until your health care provider approves.  Return to your normal activities as told by your health care provider. Ask your  health care provider what activities are safe for you. This information is not intended to replace advice given to you by your health care provider. Make sure you discuss any questions you have with your health care provider. Document Revised: 07/04/2017 Document Reviewed: 08/07/2016 Elsevier Patient Education  [place].

## 2020-06-01 NOTE — Progress Notes (Signed)
Patyient here for endometrial biopsy  PUS shows verythickened stripe  PROCEDURE NOTE: Endometrial Biopsy Patient given informed consent, signed copy in the chart.  Appropriate time out taken. . The patient was placed in the lithotomy position and the cervix brought into view with sterile speculum.  The portio of cervix was cleansed x 3 with betadine swabs.  A tenaculum was placed in the anterior lip of the cervix.  A uterine sound was used to measure the uterus.. A pipelle was introduced  into the uterus, suction created,  and an endometrial sample was obtained.Both Dr. Alba Cory and I performed endoemtrial sampling with the pipelle. Entry was not difficult but the sample was not typical  In that it was very scant, light pink mucous rather than the ypical type endometrial biopsy sample. There was also a small benign appearing polyp noted at the external os but this did not impede the procedure at all. All equipment was removed and accounted for.   The patient tolerated the procedure well.  There was very minimal spotting type bleeding.  Patient given post procedure instructions. I will notify her of  pathology results. She is to restart her xarelto tomorrow.

## 2020-06-01 NOTE — Progress Notes (Signed)
Post procedure she was asymptomatic, no pelvic pain or cramping, no significant vaginal bleeding. No dzziness or lightheadedness. Her repeat BP was 186 over 65.  I think the procedure and general situation has been stressful for her and she will likely ne nest served by returning home with her son who is with her. She is in agreement. Reviewed post procedure precautions again

## 2020-06-02 ENCOUNTER — Encounter: Payer: Self-pay | Admitting: Nurse Practitioner

## 2020-06-02 ENCOUNTER — Ambulatory Visit (INDEPENDENT_AMBULATORY_CARE_PROVIDER_SITE_OTHER): Payer: Medicare HMO | Admitting: Nurse Practitioner

## 2020-06-02 VITALS — BP 142/84 | HR 66 | Ht 64.0 in | Wt 208.0 lb

## 2020-06-02 DIAGNOSIS — Z1211 Encounter for screening for malignant neoplasm of colon: Secondary | ICD-10-CM | POA: Diagnosis not present

## 2020-06-02 DIAGNOSIS — R634 Abnormal weight loss: Secondary | ICD-10-CM

## 2020-06-02 DIAGNOSIS — Z7901 Long term (current) use of anticoagulants: Secondary | ICD-10-CM | POA: Diagnosis not present

## 2020-06-02 MED ORDER — SUPREP BOWEL PREP KIT 17.5-3.13-1.6 GM/177ML PO SOLN: 1.0000 | ORAL | 0 refills | Status: DC

## 2020-06-02 NOTE — Patient Instructions (Addendum)
If you are age 74 or older, your body mass index should be between 23-30. Your Body mass index is 35.7 kg/m. If this is out of the aforementioned range listed, please consider follow up with your Primary Care Provider.  If you are age 58 or younger, your body mass index should be between 19-25. Your Body mass index is 35.7 kg/m. If this is out of the aformentioned range listed, please consider follow up with your Primary Care Provider.   Call us if uterine biopsies show cancer . At that point we may decide to hold off on colonoscopy until a later date.  You have been scheduled for a colonoscopy. Please follow written instructions given to you at your visit today.  Please pick up your prep supplies at the pharmacy within the next 1-3 days. If you use inhalers (even only as needed), please bring them with you on the day of your procedure.  Due to recent changes in healthcare laws, you may see the results of your imaging and laboratory studies on MyChart before your provider has had a chance to review them.  We understand that in some cases there may be results that are confusing or concerning to you. Not all laboratory results come back in the same time frame and the provider may be waiting for multiple results in order to interpret others.  Please give [place] 48 hours in order for your provider to thoroughly review all the results before contacting the office for clarification of your results.   Thank you for entrusting me with your care and choosing [facility].  Tye Savoy, NP

## 2020-06-02 NOTE — Progress Notes (Signed)
ASSESSMENT AND PLAN    # 74 yo female with unexplained weight loss of about 13 pounds between April and September. Weight stable over the last month. Referred for  cancer screening.  Abnormal imaging of uterus concerning for malignancy. She is s/p endometrial bx yesterday.   # Colon cancer screening. There is a possible history of colon cancer in sister age 37 ( patient not really sure about type of cancer).  --Will arrange for colonoscopy to be done a few weeks out. If uterine biopsies positive for cancer then patient will call us to discuss whether to proceed or postpone screening colonoscopy. --Patient has no overt GI bleeding, bowel changes nor anemia. Furthermore no gross lesions on recent CT scan.    # Hx of PE, on Xarelto --Hold Xarelto for 2 days before procedure - will instruct when and how to resume after procedure. Patient understands that there is a low but real risk of cardiovascular event such as heart attack, stroke, or embolism /  thrombosis while off blood thinner. The patient consents to proceed. Will communicate by phone or EMR with patient's prescribing provider to confirm that holding Xarelto is reasonable in this case.  HISTORY OF PRESENT ILLNESS     Primary Gastroenterologist  [place], [place]  Chief Complaint : poor appetite  [name] is a 74 y.o. 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 She never felt hungry.  She would eat breakfast but had no desire to eat the remainder of the day. Loss of appetite progressed, she lost about 13 pounds between April and September. She had no abdominal pain, bowel changes, nausea / vomiting or other GI complaints. She hadn't started any new medications affecting her appetite. Her labs in September were unremarkable. CT scan w/  contrast on 10/7 showed endometrial thickening, hiatal hernia, gallbladder wall calcification   Patient in a wheelchair but can ambulate with a walker or cane.    Data Reviewed: Sept 2021 CBC normal CMP normal   Previous Endoscopic Evaluations / Pertinent Studies:   05/11/20 CT scan  abd/ pelvis with contrast Low-attenuation area in central uterus, which could represent endometrial thickening/mass or possibly a fibroid. Endometrial carcinoma cannot be excluded in a postmenopausal female. Pelvic ultrasound is recommended for further evaluation.  No other soft tissue masses or evidence of metastatic disease.  Moderate hiatal hernia.  Colonic diverticulosis, without radiographic evidence of diverticulitis.  Aortic Atherosclerosis (ICD10-I70.0).   05/25/20 pelvic ultrasound IMPRESSION: 1. Markedly thickened endometrial complex measuring up to 21 mm with heterogeneous echotexture, internal microcystic change, and associated vascularity. Findings are concerning for possible endometrial carcinoma. Endometrial sampling recommended for further evaluation. 2. 1.5 cm intramural mass at the right uterine fundus. While this finding could reflect an intramural fibroid, possible myometrial invasion of disease could also be considered. 3. Normal sonographic appearance of the ovaries. No adnexal mass or free fluid within the pelvis.   Past Medical History:  Diagnosis Date  . Anemia   . CKD (chronic kidney disease), stage III ([place])    pt doesn't know why this is listed  . DJD (degenerative joint disease) of hip   . GERD (gastroesophageal reflux disease)    h/o in the 1990s  . Heart murmur   . Hyperlipidemia   . Hypertension    sees Dr.  Gilda Crease  . Obesity (BMI 30-39.9)   . 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 Recurrent pulmonary  embolism [facility])    see pulmonologist Dr. Asencion Noble  . Sprain and strain of wrist      Past Surgical History:  Procedure Laterality Date  . CARPAL TUNNEL RELEASE  09/02/2012   Procedure: CARPAL TUNNEL RELEASE;  Surgeon: Winfield Cunas, MD;  Location: [facility];  Service: Neurosurgery;  Laterality: Right;  RIGHT carpal tunnel release  . DILATION AND CURETTAGE OF UTERUS  02/21/2012   Procedure: DILATATION AND CURETTAGE;  Surgeon: Emily Filbert, MD;  Location: [facility];  Service: Gynecology;  Laterality: N/A;  . Femoral vein ligation  12/2002  . [place] filter  11/03/2003   R Jugular  . PARTIAL HIP ARTHROPLASTY  Left, 02/2009   Dr Hal Morales  . SHOULDER SURGERY  Left 07/15/08   Dr Hal Morales  . TOTAL HIP ARTHROPLASTY  R 05/2203, L 2010  . TRANSTHORACIC ECHOCARDIOGRAM     EF 55-60%, RV mod dilated, mildly increased LV wall thickness  . TUBAL LIGATION  1981   Family History  Problem Relation Age of Onset  . Heart disease Mother   . Heart attack Mother   . Heart disease Father   . Heart attack Father   . Cancer Brother   . Cancer Sister   . Alcohol abuse Sister    Social History   Tobacco Use  . Smoking status: Never Smoker  . Smokeless tobacco: Never Used  Vaping Use  . Vaping Use: Never used  Substance Use Topics  . Alcohol use: No  . Drug use: No   Current Outpatient Medications  Medication Sig Dispense Refill  . Ascorbic Acid (VITAMIN C) 500 MG CAPS Take by mouth.    . rivaroxaban (XARELTO) 20 MG TABS tablet Take 1 tablet (20 mg total) by mouth daily. 90 tablet 0   No current facility-administered medications for this visit.   No Known Allergies   Review of Systems: All systems reviewed and negative except where noted in HPI.   PHYSICAL EXAM :    Wt Readings from Last 3 Encounters:  06/02/20 208 lb (94.3 kg)  06/01/20 208 lb (94.3 kg)  05/31/20 209 lb 12.8 oz (95.2 kg)    BP (!) 142/84 (BP Location: Left Arm, Patient Position: Sitting)   Pulse 66   Ht 5\' 4"   (1.626 m)   Wt 208 lb (94.3 kg)   BMI 35.70 kg/m  Constitutional:  Pleasant obese female in a wheelchair iin no acute distress. Psychiatric: Normal mood and affect. Behavior is normal. EENT: Pupils normal.  Conjunctivae are normal. No scleral icterus. Neck supple.  Cardiovascular: Normal rate, regular rhythm. Mumur present. No edema Pulmonary/chest: Effort normal and breath sounds normal. No wheezing, rales or rhonchi. Abdominal: Soft, nondistended, nontender. Bowel sounds active throughout. There are no masses palpable. No hepatomegaly. Neurological: Alert and oriented to person place and time. Skin: Skin is warm and dry. No rashes noted.  Tye Savoy, NP  06/02/2020, 2:53 PM  Cc:  Referring Provider Leeanne Rio, MD

## 2020-06-05 ENCOUNTER — Telehealth: Payer: Self-pay

## 2020-06-05 ENCOUNTER — Telehealth: Payer: Self-pay | Admitting: Family Medicine

## 2020-06-05 DIAGNOSIS — R9389 Abnormal findings on diagnostic imaging of other specified body structures: Secondary | ICD-10-CM

## 2020-06-05 DIAGNOSIS — R634 Abnormal weight loss: Secondary | ICD-10-CM

## 2020-06-05 LAB — SURGICAL PATHOLOGY

## 2020-06-05 NOTE — Telephone Encounter (Signed)
[facility] Pre-operative Risk Assessment     Request for surgical clearance:     Endoscopy Procedure  What type of surgery is being performed?     Colonoscopy  When is this surgery scheduled?     07-19-20  What type of clearance is required ?   Pharmacy  Are there any medications that need to be held prior to surgery and how long? Xarelto x 2 days  Practice name and name of physician performing surgery?      [facility]  What is your office phone and fax number?      Phone- [phone]  [phone]  Anesthesia type (None, local, MAC, general) ?       [facility]

## 2020-06-05 NOTE — Telephone Encounter (Signed)
Called patient with results of endometrial biopsy. Bx showed "superficial strips of inactive endometrium, no malignancy identified." Per Dr. Verlon Au note, biopsy sample obtained was not a typical sample, so concern is that malignancy still exists that was not adequately sampled. Reviewed all this with patient.   Recommend patient see GYN for further assessment, as she may benefit from [facility] vs hysteroscopy. Patient agreeable to scheduling with GYN. I will enter a referral.   Patient appreciative.  Leeanne Rio, MD

## 2020-06-05 NOTE — Progress Notes (Signed)
Reviewed and agree with management plans. ? ?Gerilyn Stargell L. Jaide Hillenburg, MD, MPH  ?

## 2020-06-08 NOTE — Progress Notes (Signed)
Dr. Ardelia Mems who is her PCP spoke with her about results. I agree with plan to refer to GYN for further evaluation and management and this referral has been placed by Dr. Ardelia Mems.

## 2020-06-14 ENCOUNTER — Telehealth: Payer: Self-pay | Admitting: Family Medicine

## 2020-06-14 DIAGNOSIS — R9389 Abnormal findings on diagnostic imaging of other specified body structures: Secondary | ICD-10-CM

## 2020-06-14 NOTE — Telephone Encounter (Signed)
Noted patient was scheduled for GYN visit at the end of December, which is too long to wait for possible uterine cancer. I spoke with [facility] who do not have anything sooner than mid-December.  Called [facility], they had no appts until after January. Called [facility] and they were able to review patient's chart and schedule her for 11/23 at 1pm with Dr. Roselie Awkward. They request a referral, will enter new referral. I called [facility] to cancel her appointment in December.  I have informed patient of this new appointment and will also send her a letter with the appointment info.  Leeanne Rio, MD

## 2020-06-16 NOTE — Telephone Encounter (Signed)
Called patient to discuss. Wanted her to be aware of risk of recurrent VTE from stopping blood thinner. In this case, I think the benefit of getting her colonoscopy outweighs the risk of blood clots. She accepts this risk and is ok with proceeding.  I think her risk level is acceptable for this procedure. Ok to proceed and hold xarelto x2 days.  Leeanne Rio, MD

## 2020-06-16 NOTE — Telephone Encounter (Signed)
Patient advised that she was given clearance to hold Xarelto per Dr Ardelia Mems for 2 days prior to colonoscopy.  Patient advised last dose of Xarelto would be taken on the evening of 07-16-20.  Patient agreed to plan and verbalized understanding.  No further questions.

## 2020-06-27 ENCOUNTER — Ambulatory Visit: Payer: Medicare HMO | Admitting: Obstetrics & Gynecology

## 2020-07-06 ENCOUNTER — Ambulatory Visit (INDEPENDENT_AMBULATORY_CARE_PROVIDER_SITE_OTHER): Payer: Medicare HMO | Admitting: Family Medicine

## 2020-07-06 ENCOUNTER — Other Ambulatory Visit: Payer: Self-pay

## 2020-07-06 ENCOUNTER — Encounter: Payer: Self-pay | Admitting: Family Medicine

## 2020-07-06 ENCOUNTER — Ambulatory Visit: Payer: Self-pay

## 2020-07-06 DIAGNOSIS — R9389 Abnormal findings on diagnostic imaging of other specified body structures: Secondary | ICD-10-CM | POA: Diagnosis not present

## 2020-07-06 DIAGNOSIS — R634 Abnormal weight loss: Secondary | ICD-10-CM

## 2020-07-06 DIAGNOSIS — I10 Essential (primary) hypertension: Secondary | ICD-10-CM

## 2020-07-06 MED ORDER — AMLODIPINE BESYLATE 5 MG PO TABS: 5.0000 mg | ORAL_TABLET | Freq: Every day | ORAL | 1 refills | Status: DC

## 2020-07-06 NOTE — Progress Notes (Signed)
  Date of Visit: 07/06/2020   SUBJECTIVE:   HPI:  [name] presents today for follow up. She has her son [name] with her today. They would like to review again her results and workup of unintentional weight loss thus far.  Discussed at length the reasons behind obtaining CT (unintentional weight loss and decreased appetite) and CT findings that led to pelvic ultrasound, and then pelvic u/s demonstrating changes concerning for endometrial cancer.  Patient has upcoming appointment with GYN at the end of December. I had her set up for an earlier appointment in November, but she rescheduled it back towards the end of December.  Also has upcoming appointment for colonoscopy in December.  Has not yet scheduled mammogram.  OBJECTIVE:   BP (!) 142/72   Pulse 89   Wt 200 lb 9.6 oz (91 kg)   SpO2 98%   BMI 34.43 kg/m  Gen: no acute distress, pleasant, cooperative, well appearing HEENT: normocephalic, atraumatic  Heart: regular rate and rhythm, no murmur Lungs: clear to auscultation bilaterally, normal work of breathing Abdomen: soft, nontender to palpation, normoactive bowel sounds, no masses Neuro: alert, grossly nonfocal, speech normal Ext: No appreciable lower extremity edema bilaterally    ASSESSMENT/PLAN:   Health maintenance:  -again advised to schedule mammogram, gave info on how to schedule, ordered -has upcoming appointment for colonoscopy  Unintentional weight loss Discussed workup thus far and remaining plans at length with patient and her son. - GYN appointment at end of December to evaluate thickened endometrium/concern for endometrial cancer - colonoscopy also scheduled in December - advised to schedule mammogram  Thickened endometrium U/S findings concerning for endometrial carcinoma. Endometrial biopsy 10/28 without malignancy but was poor quality sample. Has GYN appointment upcoming.  Hypertension Blood pressure elevated today and recently. Add amlodipine 5mg   daily. Follow up in 1 week for nurse BP check.  FOLLOW UP: Follow up in 1 wk for RN BP check  [place] J. Ardelia Mems, [place] than 30 minutes were spent on this encounter on the day of service, including pre-visit planning, actual face to face time, coordination of care, and documentation of visit.

## 2020-07-06 NOTE — Telephone Encounter (Signed)
Patient called and says she's been having left shoulder pain off and on since Sunday, but this evening the pain is not easing up. She says she can feel it in her neck all the way to the elbow, but now it's just her shoulder. She denies any other symptoms. Advised to call PCP tomorrow to schedule an appointment, advised to take Tylenol, use heat/ice on shoulder. Care advice given, she verbalized understanding.  Reason for Disposition  [1] MODERATE pain (e.g., interferes with normal activities) AND [2] present > 3 days  Answer Assessment - Initial Assessment Questions 1. ONSET: "When did the pain start?"     Sunday 2. LOCATION: "Where is the pain located?"     Left shoulder from neck to shoulder to elbow 3. PAIN: "How bad is the pain?" (Scale 1-10; or mild, moderate, severe)   - MILD (1-3): doesn't interfere with normal activities   - MODERATE (4-7): interferes with normal activities (e.g., work or school) or awakens from sleep   - SEVERE (8-10): excruciating pain, unable to do any normal activities, unable to move arm at all due to pain      8 4. WORK OR EXERCISE: "Has there been any recent work or exercise that involved this part of the body?"     No 5. CAUSE: "What do you think is causing the shoulder pain?"     I don't know 6. OTHER SYMPTOMS: "Do you have any other symptoms?" (e.g., neck pain, swelling, rash, fever, numbness, weakness)     No 7. PREGNANCY: "Is there any chance you are pregnant?" "When was your last menstrual period?"     No  Protocols used: SHOULDER PAIN-A-AH

## 2020-07-06 NOTE — Patient Instructions (Addendum)
Follow up in 1 week for nurse blood pressure check Start amlodipine 5mg  daily  Call the number on the handout below [phone]) to schedule your mammogram. This is an important test to screen for breast cancer.

## 2020-07-08 NOTE — Assessment & Plan Note (Addendum)
U/S findings concerning for endometrial carcinoma. Endometrial biopsy 10/28 without malignancy but was poor quality sample. Has GYN appointment upcoming.

## 2020-07-08 NOTE — Assessment & Plan Note (Signed)
Blood pressure elevated today and recently. Add amlodipine 5mg  daily. Follow up in 1 week for nurse BP check.

## 2020-07-08 NOTE — Assessment & Plan Note (Signed)
Discussed workup thus far and remaining plans at length with patient and her son. - GYN appointment at end of December to evaluate thickened endometrium/concern for endometrial cancer - colonoscopy also scheduled in December - advised to schedule mammogram

## 2020-07-16 ENCOUNTER — Other Ambulatory Visit: Payer: Self-pay | Admitting: Family Medicine

## 2020-07-19 ENCOUNTER — Encounter: Payer: Medicare HMO | Admitting: Gastroenterology

## 2020-07-26 ENCOUNTER — Ambulatory Visit: Payer: Medicare HMO | Admitting: Obstetrics and Gynecology

## 2020-07-26 ENCOUNTER — Ambulatory Visit: Payer: Commercial Managed Care - HMO | Admitting: Obstetrics and Gynecology

## 2020-08-02 NOTE — Progress Notes (Signed)
  Date of Visit: 08/03/2020   SUBJECTIVE:   HPI:  [name] presents today for follow up. She is accompanied by her son, [name].  Unintentional weight loss - down another 4lb since last visit. Overall patient reports feeling well. Son reports she is eating a bit better. She had colonoscopy scheduled on 12/15 but had to cancel it as she was not going to be back home in time to care for her 74 yo special needs daughter. Is agreeable to rescheduling this.  Also has not yet scheduled mammogram, needs help getting this scheduled.  Concern for endometrial cancer - Has appointment with Dr. Jolayne Panther with GYN on 08/21/20. Appointment has unfortunately been delayed multiple times due to it being rescheduled.   Hypertension - last visit we added amlodipine 5mg  daily. Has tolerated well.   OBJECTIVE:   BP 120/72   Pulse 69   Wt 196 lb 6.4 oz (89.1 kg)   SpO2 98%   BMI 33.71 kg/m  Gen: no acute distress, pleasant, cooperative HEENT: normocephalic, atraumatic. Area of 2-3cm of fullness in L side of neck below mandible, minimally tender. No supraclavicular or axillary lymphadenopathy appreciated. Heart: regular rate and rhythm, no murmur Lungs: clear to auscultation bilaterally, normal work of breathing  Neuro: grossly nonfocal, speech normal Abdomen: soft, nontender to palpation  Ext: No appreciable lower extremity edema bilaterally   ASSESSMENT/PLAN:   Health maintenance:  -assisted patient with scheduling mammogram for February -see below re: colonoscopy and attempting to rescheduled  Unintentional weight loss Continues to lose weight. Awaiting GYN consultation regarding u/s findings that were concerning for endometrial cancer (with subsequent normal endometrial biopsy but likely inadequate sample).   Assisted patient and son with contacting GI office to reschedule colonoscopy since she had to cancel that appointment. They were not able to schedule patient for another colonoscopy  appointment as apparently she may need another office visit there first. Awaiting review from GI MD regarding whether this can be scheduled. Notably we have already established a plan surrounding her anticoagulation (will hold xarelto x2 days prior to procedure) so hopefully this can be scheduled soon. Son prefers I help coordinate scheduling this, so provided GI office my number so they can contact me regarding appointment and then I will communicate it directly to patient and son. She needs appointments between 11am and 2pm (needs to be home by 4pm) in order to care for her daughter.   Also assisted her with scheudling mammogram so she can get updated on age-appropriate cancer screenings.  Notably on exam today identified area of fullness in L anterior neck below the mandible; may represent normal anatomy but given weight loss and not noticing this in the past we will check soft tissue u/s of neck. This has been scheduled.  Hypertension Improved on amlodipine, continue this medication.   FOLLOW UP: Follow up in 6wks for weight loss, sooner if needed  March J. [place], MD [facility]  Greater than 30 minutes were spent on this encounter on the day of service, including pre-visit planning, actual face to face time, coordination of care, and documentation of visit.

## 2020-08-03 ENCOUNTER — Other Ambulatory Visit: Payer: Self-pay

## 2020-08-03 ENCOUNTER — Telehealth: Payer: Self-pay | Admitting: Gastroenterology

## 2020-08-03 ENCOUNTER — Encounter: Payer: Self-pay | Admitting: Family Medicine

## 2020-08-03 ENCOUNTER — Ambulatory Visit (INDEPENDENT_AMBULATORY_CARE_PROVIDER_SITE_OTHER): Payer: Medicare HMO | Admitting: Family Medicine

## 2020-08-03 VITALS — BP 120/72 | HR 69 | Wt 196.4 lb

## 2020-08-03 DIAGNOSIS — R221 Localized swelling, mass and lump, neck: Secondary | ICD-10-CM | POA: Diagnosis not present

## 2020-08-03 DIAGNOSIS — I10 Essential (primary) hypertension: Secondary | ICD-10-CM | POA: Diagnosis not present

## 2020-08-03 DIAGNOSIS — R634 Abnormal weight loss: Secondary | ICD-10-CM | POA: Diagnosis not present

## 2020-08-03 NOTE — Telephone Encounter (Signed)
Good morning Dr. Orvan Falconer, we received a call from patient who was with her PCP, Dr. Pollie Meyer wanting to schedule her colonoscopy.  She is currently on Xarelto and PCP wanted to know if ou can give her a call back to her direct cell at [phone] to consult please.  Did inform PCP you'll be back to the office 08/08/20.

## 2020-08-03 NOTE — Patient Instructions (Addendum)
Scheduled mammogram today for 09/12/20 at 11:50am.  Also scheduled ultrasound of your neck for 08/10/20 at 12:30pm (arrive at 12:15pm).  I will call you when they get back to me about the colonoscopy.  Please call with any questions.  Follow up with me in mid-February, sooner if any issues.  Be well, Dr. Pollie Meyer

## 2020-08-05 NOTE — Assessment & Plan Note (Signed)
Improved on amlodipine, continue this medication.

## 2020-08-05 NOTE — Assessment & Plan Note (Addendum)
Continues to lose weight. Awaiting GYN consultation regarding u/s findings that were concerning for endometrial cancer (with subsequent normal endometrial biopsy but likely inadequate sample).   Assisted patient and son with contacting GI office to reschedule colonoscopy since she had to cancel that appointment. They were not able to schedule patient for another colonoscopy appointment as apparently she may need another office visit there first. Awaiting review from GI MD regarding whether this can be scheduled. Notably we have already established a plan surrounding her anticoagulation (will hold xarelto x2 days prior to procedure) so hopefully this can be scheduled soon. Son prefers I help coordinate scheduling this, so provided GI office my number so they can contact me regarding appointment and then I will communicate it directly to patient and son. She needs appointments between 11am and 2pm (needs to be home by 4pm) in order to care for her daughter.   Also assisted her with scheudling mammogram so she can get updated on age-appropriate cancer screenings.  Notably on exam today identified area of fullness in L anterior neck below the mandible; may represent normal anatomy but given weight loss and not noticing this in the past we will check soft tissue u/s of neck. This has been scheduled.

## 2020-08-09 NOTE — Telephone Encounter (Signed)
I called Dr. Pollie Meyer. Left my cell phone number.

## 2020-08-10 ENCOUNTER — Ambulatory Visit (HOSPITAL_COMMUNITY): Payer: Medicare HMO

## 2020-08-10 NOTE — Telephone Encounter (Signed)
Discussed with Dr. Pollie Meyer last night. Please schedule EGD and colonoscopy. Please schedule the procedures directly with Dr. Pollie Meyer via her cell phone. She will communicate the plans to the patient and the patient's son. Thanks.

## 2020-08-11 ENCOUNTER — Ambulatory Visit (HOSPITAL_COMMUNITY)
Admission: RE | Admit: 2020-08-11 | Discharge: 2020-08-11 | Disposition: A | Discharge status: Home / Self Care | Payer: Medicare HMO | Source: Ambulatory Visit | Attending: Family Medicine | Admitting: Family Medicine

## 2020-08-11 ENCOUNTER — Other Ambulatory Visit: Payer: Self-pay

## 2020-08-11 DIAGNOSIS — R634 Abnormal weight loss: Secondary | ICD-10-CM | POA: Insufficient documentation

## 2020-08-11 DIAGNOSIS — R221 Localized swelling, mass and lump, neck: Secondary | ICD-10-CM | POA: Diagnosis not present

## 2020-08-11 DIAGNOSIS — R590 Localized enlarged lymph nodes: Secondary | ICD-10-CM | POA: Diagnosis not present

## 2020-08-16 ENCOUNTER — Telehealth: Payer: Self-pay | Admitting: Family Medicine

## 2020-08-16 ENCOUNTER — Telehealth: Payer: Self-pay

## 2020-08-16 NOTE — Telephone Encounter (Signed)
I spoke with Dr. Ardelia Mems about holding the Xarelto. But, I did not clarify with her if she would instruct the patient. She only asked that we schedule the procedure directly with her. Thanks.

## 2020-08-16 NOTE — Telephone Encounter (Signed)
Received call from [facility] to schedule patient's colonoscopy and EGD. appts scheduled for 3/9 at 1pm to get prep instructions Then 3/21 at 11am for EGD/colonoscopy This is the soonest they could schedule within the timeframe patient needs (11am to 2pm appts)  Called patient and her son and gave them appointment info Reminded them both of her GYN appointment on Monday.  They were appreciative. Leeanne Rio, MD

## 2020-08-16 NOTE — Telephone Encounter (Signed)
[employer] from [facility] Medicaid calls nurse line requesting a letter documenting that this patient has health limitations and that she would benefit from medicaid services for disabled daughter. Similar letter was drafted in April 2021, however, new letter is needed with each review of services.   Please fax completed letter to [phone]. Release of information was completed in April to provide documents to Medicaid.   Please advise  Talbot Grumbling, RN

## 2020-08-16 NOTE — Telephone Encounter (Signed)
Dr. Ardelia Mems will [name] be ok to hold her xarelto for 2 days prior to endo/colon  procedures? Please advise.

## 2020-08-16 NOTE — Telephone Encounter (Signed)
Pt scheduled for previsit 10/11/20@1pm , ECL scheduled in the [place] 10/23/20@11am . Dr. Ardelia Mems will let the pt know about the appts. Pt is on xarelto and has been vaccinated. Dr. Tarri Glenn per your discussion with Dr. Ardelia Mems was she going to instruct pt regarding holding xarelto?

## 2020-08-17 NOTE — Telephone Encounter (Signed)
Yes, that is fine for her to hold the xarelto for 2 days prior to the EGD/colonoscopy. I discussed this with patient previously over the phone and she accepted the risks associated with holding her anticoagulant.  Thanks! Leeanne Rio, MD

## 2020-08-17 NOTE — Telephone Encounter (Signed)
Letter written, will place in fax pile  Please also remind patient that she needs to not take her xarelto for 2 days prior to her colonoscopy.  Thanks Leeanne Rio, MD

## 2020-08-18 NOTE — Telephone Encounter (Signed)
Spoke to patient and informed her of letter and reminded her to stop her Xarelto 2 days prior to her colonoscopy.  Patient verbalized understanding.  [name], [place]

## 2020-08-21 ENCOUNTER — Ambulatory Visit: Payer: Medicare HMO | Admitting: Obstetrics and Gynecology

## 2020-08-29 ENCOUNTER — Other Ambulatory Visit: Payer: Self-pay | Admitting: Family Medicine

## 2020-08-31 ENCOUNTER — Encounter: Payer: Self-pay | Admitting: Obstetrics and Gynecology

## 2020-08-31 ENCOUNTER — Other Ambulatory Visit: Payer: Self-pay

## 2020-08-31 ENCOUNTER — Ambulatory Visit (INDEPENDENT_AMBULATORY_CARE_PROVIDER_SITE_OTHER): Payer: Medicare HMO | Admitting: Obstetrics and Gynecology

## 2020-08-31 VITALS — BP 165/74 | HR 85 | Ht 63.0 in | Wt 197.0 lb

## 2020-08-31 DIAGNOSIS — Z1231 Encounter for screening mammogram for malignant neoplasm of breast: Secondary | ICD-10-CM

## 2020-08-31 DIAGNOSIS — R9389 Abnormal findings on diagnostic imaging of other specified body structures: Secondary | ICD-10-CM | POA: Diagnosis not present

## 2020-08-31 NOTE — Progress Notes (Signed)
75 yo P2 postmenopausal with BMI 34 who is here for evaluation of thickened endometrium. Patient reports a history of anorexia which prompted evaluation with CT scan. CT scan noted an endometrial mass. Follow up pelvic ultrasound in 05/2020 demonstrated a thickened endometrium of 2 cm concerning for carcinoma. Patient had an endometrial biopsy which was negative for malignancy. Patient denies any episodes of postmenopausal vaginal bleeding. She denies any pelvic pain or abnormal discharge. Patient is currently on lifetime anticoagulation for recurrent DVT  Past Medical History:  Diagnosis Date  . Anemia   . CKD (chronic kidney disease), stage III ([place])    pt doesn't know why this is listed  . DJD (degenerative joint disease) of hip   . GERD (gastroesophageal reflux disease)    h/o in the 1990s  . Heart murmur   . Hyperlipidemia   . Hypertension    sees Dr. Gilda Crease  . Obesity (BMI 30-39.9)   . 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 Recurrent pulmonary embolism [facility])    see pulmonologist Dr. Asencion Noble  . Sprain and strain of wrist    Past Surgical History:  Procedure Laterality Date  . CARPAL TUNNEL RELEASE  09/02/2012   Procedure: CARPAL TUNNEL RELEASE;  Surgeon: Winfield Cunas, MD;  Location: [facility];  Service: Neurosurgery;  Laterality: Right;  RIGHT carpal tunnel release  . DILATION AND CURETTAGE OF UTERUS  02/21/2012   Procedure: DILATATION AND CURETTAGE;  Surgeon: Emily Filbert, MD;  Location: [facility];  Service: Gynecology;  Laterality: N/A;  . Femoral vein ligation  12/2002  . [place] filter  11/03/2003   R Jugular  . PARTIAL HIP ARTHROPLASTY  Left, 02/2009   Dr Hal Morales  . SHOULDER SURGERY  Left 07/15/08   Dr Hal Morales  . TOTAL HIP ARTHROPLASTY  R 05/2203, L 2010  . TRANSTHORACIC ECHOCARDIOGRAM     EF 55-60%, RV mod dilated,  mildly increased LV wall thickness  . TUBAL LIGATION  1981   Family History  Problem Relation Age of Onset  . Heart disease Mother   . Heart attack Mother   . Heart disease Father   . Heart attack Father   . Cancer Brother   . Cancer Sister   . Alcohol abuse Sister    Social History   Tobacco Use  . Smoking status: Never Smoker  . Smokeless tobacco: Never Used  Vaping Use  . Vaping Use: Never used  Substance Use Topics  . Alcohol use: No  . Drug use: No   ROS See pertinent in HPI. All other systems reviewed and non contributory  Blood pressure (!) 165/74, pulse 85, height 5\' 3"  (1.6 m), weight 197 lb (89.4 kg). GENERAL: Well-developed, well-nourished female in no acute distress.  NEURO: alert and oriented x 3  A/P 75 yo with thickened endometrium and anorexia - discussed repeating endometrial biopsy vs D&C with hysteroscopy. Patient opted for [facility] with hysteroscopy - Risks, benefits and alternatives were explained including but not limited to risks of bleeding, infection and damage to adjacent organs. Patient verbalized understanding and all questions were answered - Patient scheduled for colonoscopy in March - Screening mammogram ordered - RTC prn

## 2020-08-31 NOTE — Progress Notes (Signed)
Pt is here today to establish care. Pt had thickened endometrium on U/S 10/2019. ECC was performed 06/01/20 which was benign. Pt denies any vaginal bleeding at this time.

## 2020-09-01 NOTE — Telephone Encounter (Signed)
Spoke wioh pt son and she doesn't take that. [name] Kennon Holter, CMA

## 2020-09-01 NOTE — Telephone Encounter (Signed)
Can you clarify with patient whether she is still taking this medication? It is not on her medication list currently  Thanks! Leeanne Rio, MD

## 2020-09-12 ENCOUNTER — Ambulatory Visit
Admission: RE | Admit: 2020-09-12 | Discharge: 2020-09-12 | Disposition: A | Discharge status: Home / Self Care | Payer: Medicare Other | Source: Ambulatory Visit | Attending: Family Medicine | Admitting: Family Medicine

## 2020-09-12 ENCOUNTER — Ambulatory Visit: Payer: Medicare HMO

## 2020-09-12 ENCOUNTER — Other Ambulatory Visit: Payer: Self-pay

## 2020-09-12 DIAGNOSIS — Z1231 Encounter for screening mammogram for malignant neoplasm of breast: Secondary | ICD-10-CM

## 2020-09-15 ENCOUNTER — Other Ambulatory Visit: Payer: Self-pay | Admitting: Family Medicine

## 2020-09-15 DIAGNOSIS — R928 Other abnormal and inconclusive findings on diagnostic imaging of breast: Secondary | ICD-10-CM

## 2020-09-25 NOTE — Progress Notes (Unsigned)
  Date of Visit: 09/26/2020   SUBJECTIVE:   HPI:  [name] presents today for routine follow up.  Weight loss - down another few lbs since last visit. Appetite still not great, eating about one meal per day.  Has upcoming appts: 2/25 diagnostic mammogram 3/1 D&C with hysteroscopy 3/21 EGD & colonoscopy (pre-visit on 3/9)  She has not been contacted regarding what she should do with her xarelto prior to the procedure on 3/1.  Hypertension - prescribed amlodipine 5mg  daily, with pharmacy records suggesting this has been dispensed but patient is not sure if she's been taking it or not  OBJECTIVE:   BP (!) 143/80   Pulse 91   Ht 5\' 4"  (1.626 m)   Wt 193 lb 12.8 oz (87.9 kg)   SpO2 98%   BMI 33.27 kg/m  Gen: no acute distress, pleasant, cooperative HEENT: normocephalic, atraumatic. No anterior cervical or supraclavicular lymphadenopathy.  Heart: regular rate and rhythm, no murmur Lungs: clear to auscultation bilaterally, normal work of breathing  Neuro: alert, speech normal, grossly nonfocal Ext: No appreciable lower extremity edema bilaterally   ASSESSMENT/PLAN:   Health maintenance:  -upcoming diagnostic mammo scheduled for calcifications on screening mammo -has colonoscopy scheduled next month  Thickened endometrium Has D&C with hysteroscopy scheduled in 1 week Advised if she has not been contacted regarding perioperative anticoagulation plan to reach out to [place] this Friday I have not received any requests to provide input, will watch for this in my inbox  Unintentional weight loss Continues on process of workup. Remaining items: - EGD and colonoscopy scheduled - D&C and hysteroscopy scheduled - diagnostic mammo scheduled  Follow up with me in early April after all these are done.  Hypertension Blood pressure elevated today above goal, unclear whether patient is actually taking amlodipine Son will review medications and let us know if she's been taking it  FOLLOW  UP: Follow up in early April for above issues  [place] J. Ardelia Mems, [place]

## 2020-09-26 ENCOUNTER — Other Ambulatory Visit: Payer: Self-pay

## 2020-09-26 ENCOUNTER — Encounter: Payer: Self-pay | Admitting: Family Medicine

## 2020-09-26 ENCOUNTER — Ambulatory Visit (INDEPENDENT_AMBULATORY_CARE_PROVIDER_SITE_OTHER): Payer: Medicare Other | Admitting: Family Medicine

## 2020-09-26 DIAGNOSIS — R9389 Abnormal findings on diagnostic imaging of other specified body structures: Secondary | ICD-10-CM

## 2020-09-26 DIAGNOSIS — R634 Abnormal weight loss: Secondary | ICD-10-CM | POA: Diagnosis not present

## 2020-09-26 DIAGNOSIS — I10 Essential (primary) hypertension: Secondary | ICD-10-CM | POA: Diagnosis not present

## 2020-09-26 NOTE — Patient Instructions (Signed)
Check your medications and let me know if you've been taking the amlodipine.  Call us if you haven't heard by Friday what the plan should be about your blood thinner.  Follow up with me 4/5 as scheduled  Be well, Dr. Ardelia Mems

## 2020-09-26 NOTE — Assessment & Plan Note (Signed)
Has D&C with hysteroscopy scheduled in 1 week Advised if she has not been contacted regarding perioperative anticoagulation plan to reach out to [place] this Friday I have not received any requests to provide input, will watch for this in my inbox

## 2020-09-26 NOTE — Assessment & Plan Note (Signed)
Blood pressure elevated today above goal, unclear whether patient is actually taking amlodipine Son will review medications and let us know if she's been taking it

## 2020-09-26 NOTE — Assessment & Plan Note (Signed)
Continues on process of workup. Remaining items: - EGD and colonoscopy scheduled - D&C and hysteroscopy scheduled - diagnostic mammo scheduled  Follow up with me in early April after all these are done.

## 2020-09-27 ENCOUNTER — Other Ambulatory Visit: Payer: Self-pay

## 2020-09-27 ENCOUNTER — Encounter (HOSPITAL_BASED_OUTPATIENT_CLINIC_OR_DEPARTMENT_OTHER): Payer: Self-pay | Admitting: Obstetrics and Gynecology

## 2020-09-27 NOTE — Progress Notes (Addendum)
Spoke w/ via phone for pre-op interview---pt son lynwood Clenney due to pt gets confused Lab needs dos----     Ekg, I stat T & S          Lab results------ekg 11-01-2018 done in er abnormal no significant change from 2017 ekg per er note, pulmonary lov dr Ander Slade 04-08-2019 epic, sleep study 10-02-2015 epic, no cpap used, echo 09-09-2013 epic COVID test ------09-29-2020 1130 Arrive at -------700 am 10-03-2020  NPO after MN NO Solid Food. Water from MN until---600 am then npo Medications to take morning of surgery -----none Diabetic medication -----n/a Patient Special Instructions -----none Pre-Op special Istructions -----none Patient verbalized understanding of instructions that were given at this phone interview. Patient denies shortness of breath, chest pain, fever, cough at this phone interview.  Pt son lynwood to call dr Ardelia Mems Friday 09-29-2020 to receive instructions on holding xarelto prior to procedure as per dr Horton Chin 09-26-2020 request  Pt son lynwood needs to go back to pre op to help with medications due to patient confusion at times  Pt needs wheelchair dos

## 2020-09-29 ENCOUNTER — Other Ambulatory Visit: Payer: Self-pay | Admitting: Family Medicine

## 2020-09-29 ENCOUNTER — Telehealth: Payer: Self-pay

## 2020-09-29 ENCOUNTER — Other Ambulatory Visit: Payer: Self-pay

## 2020-09-29 ENCOUNTER — Other Ambulatory Visit (HOSPITAL_COMMUNITY)
Admission: RE | Admit: 2020-09-29 | Discharge: 2020-09-29 | Disposition: A | Discharge status: Home / Self Care | Payer: Medicare Other | Source: Ambulatory Visit | Attending: Obstetrics and Gynecology | Admitting: Obstetrics and Gynecology

## 2020-09-29 ENCOUNTER — Ambulatory Visit
Admission: RE | Admit: 2020-09-29 | Discharge: 2020-09-29 | Disposition: A | Discharge status: Home / Self Care | Payer: Medicare Other | Source: Ambulatory Visit | Attending: Family Medicine | Admitting: Family Medicine

## 2020-09-29 DIAGNOSIS — Z01812 Encounter for preprocedural laboratory examination: Secondary | ICD-10-CM | POA: Insufficient documentation

## 2020-09-29 DIAGNOSIS — R928 Other abnormal and inconclusive findings on diagnostic imaging of breast: Secondary | ICD-10-CM

## 2020-09-29 DIAGNOSIS — Z20822 Contact with and (suspected) exposure to covid-19: Secondary | ICD-10-CM | POA: Diagnosis not present

## 2020-09-29 DIAGNOSIS — R921 Mammographic calcification found on diagnostic imaging of breast: Secondary | ICD-10-CM

## 2020-09-29 LAB — SARS CORONAVIRUS 2 (TAT 6-24 HRS): SARS Coronavirus 2: NEGATIVE

## 2020-09-29 NOTE — Telephone Encounter (Signed)
Patient's son calls nurse line regarding upcoming procedure. Requesting advice on how patient should proceed regarding Xarelto prior to procedure on 3/1.  Please advise. Please call son back at [phone] with further instructions.   Talbot Grumbling, RN

## 2020-09-29 NOTE — Telephone Encounter (Signed)
I reached out to OBGYN Dr. Elly Modena for guidance but have not heard back. Returned call to patient's son. Recommend she hold xarelto day prior to procedure. I will let them know if I find out something different from Dr. Elly Modena  Leeanne Rio, MD

## 2020-10-02 NOTE — Progress Notes (Signed)
Spoke w Leane Call, pt's son. Linwood informed of OR schedule change. Pt to arrive at 0530 in am.  Linwood verbalized his understanding.

## 2020-10-02 NOTE — Anesthesia Preprocedure Evaluation (Addendum)
Anesthesia Evaluation  Patient identified by MRN, date of birth, ID band Patient awake    Reviewed: Allergy & Precautions, NPO status , Patient's Chart, lab work & pertinent test results  History of Anesthesia Complications Negative for: history of anesthetic complications  Airway Mallampati: I  TM Distance: >3 FB Neck ROM: Full    Dental  (+) Dental Advisory Given, Edentulous Upper, Missing   Pulmonary  09/29/2020 SARS coronavirus NEG   breath sounds clear to auscultation       Cardiovascular hypertension, Pt. on medications + DVT   Rhythm:Regular Rate:Normal  '15 ECHO: EF 55-60%. Wall motion was normal; there were no regional wall motion abnormalities, no significant valvular abnormalities    Neuro/Psych negative neurological ROS     GI/Hepatic Neg liver ROS, GERD  Controlled,  Endo/Other  Morbid obesity  Renal/GU Renal InsufficiencyRenal disease     Musculoskeletal  (+) Arthritis ,   Abdominal (+) + obese,   Peds  Hematology Xarelto   Anesthesia Other Findings   Reproductive/Obstetrics                            Anesthesia Physical Anesthesia Plan  ASA: III  Anesthesia Plan: General   Post-op Pain Management:    Induction: Intravenous  PONV Risk Score and Plan: 3 and Ondansetron and Dexamethasone  Airway Management Planned: LMA  Additional Equipment: None  Intra-op Plan:   Post-operative Plan:   Informed Consent: I have reviewed the patients History and Physical, chart, labs and discussed the procedure including the risks, benefits and alternatives for the proposed anesthesia with the patient or authorized representative who has indicated his/her understanding and acceptance.     Dental advisory given  Plan Discussed with: CRNA and Surgeon  Anesthesia Plan Comments:        Anesthesia Quick Evaluation

## 2020-10-02 NOTE — H&P (Signed)
[name] is an 75 y.o. female P2 postmenopausal here for scheduled dilatation and currettage with hysteroscopy for the evaluation of a thickened endometrium. Patient denies any episodes of postmenopausal vaginal bleeding but admits to anorexia and resulting weight loss. Patient is currently on anticoagulation for recurrent DVT  Pertinent Gynecological History: Menses: post-menopausal Last mammogram: abnormal: [facility]- 4 patient scheduled for biopsy Date: 09/29/20    Menstrual History: No LMP recorded. Patient is postmenopausal.    Past Medical History:  Diagnosis Date  . Ambulates with cane   . Anemia    hx of yrs ago  . DJD (degenerative joint disease) of hip   . GERD (gastroesophageal reflux disease)    h/o in the 1990s  . Heart murmur    mild no cardiologist  . Hyperlipidemia   . Hypertension    sees Dr. Gilda Crease  . Memory impairment    gets confused about medications history etc speak with son lynwood [facility]anner cell [phone]  . Obesity (BMI 30-39.9)   . Pneumonia 10/2018  . 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 Recurrent pulmonary embolism [facility])    see pulmonologist Dr. Asencion Noble  . Sleep apnea    sleep study 2017 no cpap given  . Wears glasses for reading  . Wears partial dentures upper    Past Surgical History:  Procedure Laterality Date  . CARPAL TUNNEL RELEASE  09/02/2012   Procedure: CARPAL TUNNEL RELEASE;  Surgeon: Winfield Cunas, MD;  Location: [facility];  Service: Neurosurgery;  Laterality: Right;  RIGHT carpal tunnel release  . DILATION AND CURETTAGE OF UTERUS  02/21/2012   Procedure: DILATATION AND CURETTAGE;  Surgeon: Emily Filbert, MD;  Location: [facility] [place] [facility];  Service: Gynecology;  Laterality: N/A;  . Femoral vein ligation  12/2002  . [place] filter  11/03/2003   R Jugular  . SHOULDER SURGERY  Left  07/15/08   Dr Hal Morales  . TOTAL HIP ARTHROPLASTY  R 05/2203, L 2010  . TRANSTHORACIC ECHOCARDIOGRAM     EF 55-60%, RV mod dilated, mildly increased LV wall thickness  . TUBAL LIGATION  1981    Family History  Problem Relation Age of Onset  . Heart disease Mother   . Heart attack Mother   . Heart disease Father   . Heart attack Father   . Cancer Brother   . Cancer Sister   . Alcohol abuse Sister     Social History:  reports that she has never smoked. She has never used smokeless tobacco. She reports that she does not drink alcohol and does not use drugs.  Allergies: No Known Allergies  Medications Prior to Admission  Medication Sig Dispense Refill Last Dose  . amLODipine (NORVASC) 5 MG tablet Take 1 tablet (5 mg total) by mouth at bedtime. 90 tablet 1 10/02/2020 at Unknown time  . Ascorbic Acid (VITAMIN C) 500 MG CAPS Take by mouth.   10/02/2020 at Unknown time  . XARELTO 20 MG TABS tablet TAKE 1 TABLET BY MOUTH EVERY DAY (Patient taking differently: at bedtime.) 90 tablet 3 10/01/2020    Review of Systems See pertinent in HPI. All other systems reviewed and non contributory Blood pressure 137/72, pulse 75, temperature 97.9 F (36.6 C), temperature source Oral, resp. rate 16, height 5\' 4"  (1.626 m), weight 88.7 kg, SpO2 99 %. Physical Exam GENERAL: Well-developed,  well-nourished female in no acute distress.  LUNGS: Clear to auscultation bilaterally.  HEART: Regular rate and rhythm. ABDOMEN: Soft, nontender, nondistended. No organomegaly. PELVIC: Deferred to OR EXTREMITIES: No cyanosis, clubbing, or edema, 2+ distal pulses.  Results for orders placed or performed during the hospital encounter of 10/03/20 (from the past 24 hour(s))  Type and screen     Status: None   Collection Time: 10/03/20  6:22 AM  Result Value Ref Range   ABO/RH(D) AB POS    Antibody Screen NEG    Sample Expiration      10/06/2020,2359 Performed at [facility], [place] [address],  [place],  [address]     No results found.  Assessment/Plan: 75 yo with thickened endometrium in postmenopausal setting here for D&C with hysteroscopy - Risks, benefits and alternatives were reviewed with the patient including but not limited to risks of bleeding, infection, uterine perforation and damage to adjacent organs.  - Patient verbalized understanding and all questions were answered  Jazlene Bares 10/03/2020, 7:34 AM

## 2020-10-03 ENCOUNTER — Ambulatory Visit (HOSPITAL_BASED_OUTPATIENT_CLINIC_OR_DEPARTMENT_OTHER)
Admission: RE | Admit: 2020-10-03 | Discharge: 2020-10-03 | Disposition: A | Discharge status: Home / Self Care | Payer: Medicare HMO | Attending: Obstetrics and Gynecology | Admitting: Obstetrics and Gynecology

## 2020-10-03 ENCOUNTER — Encounter (HOSPITAL_BASED_OUTPATIENT_CLINIC_OR_DEPARTMENT_OTHER): Payer: Self-pay | Admitting: Obstetrics and Gynecology

## 2020-10-03 ENCOUNTER — Ambulatory Visit (HOSPITAL_BASED_OUTPATIENT_CLINIC_OR_DEPARTMENT_OTHER): Payer: Medicare HMO | Admitting: Anesthesiology

## 2020-10-03 ENCOUNTER — Encounter (HOSPITAL_BASED_OUTPATIENT_CLINIC_OR_DEPARTMENT_OTHER)
Admission: RE | Disposition: A | Discharge status: Home / Self Care | Payer: Self-pay | Source: Home / Self Care | Attending: Obstetrics and Gynecology

## 2020-10-03 ENCOUNTER — Other Ambulatory Visit: Payer: Self-pay

## 2020-10-03 DIAGNOSIS — Z86711 Personal history of pulmonary embolism: Secondary | ICD-10-CM | POA: Diagnosis not present

## 2020-10-03 DIAGNOSIS — Z7901 Long term (current) use of anticoagulants: Secondary | ICD-10-CM | POA: Insufficient documentation

## 2020-10-03 DIAGNOSIS — I10 Essential (primary) hypertension: Secondary | ICD-10-CM | POA: Diagnosis not present

## 2020-10-03 DIAGNOSIS — N840 Polyp of corpus uteri: Secondary | ICD-10-CM | POA: Insufficient documentation

## 2020-10-03 DIAGNOSIS — Z96643 Presence of artificial hip joint, bilateral: Secondary | ICD-10-CM | POA: Insufficient documentation

## 2020-10-03 DIAGNOSIS — E785 Hyperlipidemia, unspecified: Secondary | ICD-10-CM | POA: Diagnosis not present

## 2020-10-03 DIAGNOSIS — Z79899 Other long term (current) drug therapy: Secondary | ICD-10-CM | POA: Diagnosis not present

## 2020-10-03 DIAGNOSIS — R9389 Abnormal findings on diagnostic imaging of other specified body structures: Secondary | ICD-10-CM

## 2020-10-03 DIAGNOSIS — Z86718 Personal history of other venous thrombosis and embolism: Secondary | ICD-10-CM | POA: Insufficient documentation

## 2020-10-03 DIAGNOSIS — K219 Gastro-esophageal reflux disease without esophagitis: Secondary | ICD-10-CM | POA: Diagnosis not present

## 2020-10-03 HISTORY — DX: Other amnesia: R41.3

## 2020-10-03 HISTORY — PX: HYSTEROSCOPY WITH D & C: SHX1775

## 2020-10-03 HISTORY — DX: Dependence on other enabling machines and devices: Z99.89

## 2020-10-03 HISTORY — DX: Presence of dental prosthetic device (complete) (partial): Z97.2

## 2020-10-03 HISTORY — DX: Presence of spectacles and contact lenses: Z97.3

## 2020-10-03 HISTORY — DX: Sleep apnea, unspecified: G47.30

## 2020-10-03 LAB — TYPE AND SCREEN
ABO/RH(D): AB POS
Antibody Screen: NEGATIVE

## 2020-10-03 SURGERY — DILATATION AND CURETTAGE /HYSTEROSCOPY
Anesthesia: General | Site: Vagina

## 2020-10-03 MED ORDER — ACETAMINOPHEN 500 MG PO TABS: 1000.0000 mg | ORAL_TABLET | Freq: Once | ORAL | Status: DC

## 2020-10-03 MED ORDER — DEXAMETHASONE SODIUM PHOSPHATE 10 MG/ML IJ SOLN
INTRAMUSCULAR | Status: AC
  Filled 2020-10-03: qty 1

## 2020-10-03 MED ORDER — ONDANSETRON HCL 4 MG/2ML IJ SOLN
INTRAMUSCULAR | Status: DC | PRN
  Administered 2020-10-03: 4 mg via INTRAVENOUS

## 2020-10-03 MED ORDER — OXYCODONE HCL 5 MG/5ML PO SOLN: 5.0000 mg | Freq: Once | ORAL | Status: DC | PRN

## 2020-10-03 MED ORDER — PROPOFOL 10 MG/ML IV BOLUS
INTRAVENOUS | Status: DC | PRN
  Administered 2020-10-03: 50 mg via INTRAVENOUS
  Administered 2020-10-03: 150 mg via INTRAVENOUS

## 2020-10-03 MED ORDER — LIDOCAINE HCL (PF) 2 % IJ SOLN
INTRAMUSCULAR | Status: AC
  Filled 2020-10-03: qty 10

## 2020-10-03 MED ORDER — LIDOCAINE 2% (20 MG/ML) 5 ML SYRINGE
INTRAMUSCULAR | Status: DC | PRN
  Administered 2020-10-03: 50 mg via INTRAVENOUS

## 2020-10-03 MED ORDER — SODIUM CHLORIDE 0.9 % IR SOLN
Status: DC | PRN
  Administered 2020-10-03 (×2): 3000 mL

## 2020-10-03 MED ORDER — DEXAMETHASONE SODIUM PHOSPHATE 10 MG/ML IJ SOLN
INTRAMUSCULAR | Status: DC | PRN
  Administered 2020-10-03: 5 mg via INTRAVENOUS

## 2020-10-03 MED ORDER — LIDOCAINE HCL 1 % IJ SOLN
INTRAMUSCULAR | Status: AC
  Filled 2020-10-03: qty 40

## 2020-10-03 MED ORDER — EPHEDRINE SULFATE-NACL 50-0.9 MG/10ML-% IV SOSY
PREFILLED_SYRINGE | INTRAVENOUS | Status: DC | PRN
  Administered 2020-10-03 (×2): 10 mg via INTRAVENOUS

## 2020-10-03 MED ORDER — OXYCODONE-ACETAMINOPHEN 5-325 MG PO TABS
1.0000 | ORAL_TABLET | Freq: Four times a day (QID) | ORAL | 0 refills | Status: DC | PRN
Start: 2020-10-03 — End: 2020-11-07

## 2020-10-03 MED ORDER — PROMETHAZINE HCL 25 MG/ML IJ SOLN
6.2500 mg | INTRAMUSCULAR | Status: DC | PRN
Start: 2020-10-03 — End: 2020-10-03

## 2020-10-03 MED ORDER — LACTATED RINGERS IV SOLN: INTRAVENOUS | Status: DC

## 2020-10-03 MED ORDER — POVIDONE-IODINE 10 % EX SWAB: 2.0000 "application " | Freq: Once | CUTANEOUS | Status: DC

## 2020-10-03 MED ORDER — PROPOFOL 10 MG/ML IV BOLUS
INTRAVENOUS | Status: AC
  Filled 2020-10-03: qty 40

## 2020-10-03 MED ORDER — ACETAMINOPHEN 500 MG PO TABS
ORAL_TABLET | ORAL | Status: AC
  Filled 2020-10-03: qty 2

## 2020-10-03 MED ORDER — ACETAMINOPHEN 500 MG PO TABS
1000.0000 mg | ORAL_TABLET | Freq: Once | ORAL | Status: AC
  Administered 2020-10-03: 1000 mg via ORAL

## 2020-10-03 MED ORDER — FENTANYL CITRATE (PF) 100 MCG/2ML IJ SOLN
INTRAMUSCULAR | Status: DC | PRN
  Administered 2020-10-03 (×2): 50 ug via INTRAVENOUS

## 2020-10-03 MED ORDER — MEPERIDINE HCL 25 MG/ML IJ SOLN: 6.2500 mg | INTRAMUSCULAR | Status: DC | PRN

## 2020-10-03 MED ORDER — MIDAZOLAM HCL 2 MG/2ML IJ SOLN: 0.5000 mg | Freq: Once | INTRAMUSCULAR | Status: DC | PRN

## 2020-10-03 MED ORDER — OXYCODONE HCL 5 MG PO TABS: 5.0000 mg | ORAL_TABLET | Freq: Once | ORAL | Status: DC | PRN

## 2020-10-03 MED ORDER — FENTANYL CITRATE (PF) 100 MCG/2ML IJ SOLN: 25.0000 ug | INTRAMUSCULAR | Status: DC | PRN

## 2020-10-03 MED ORDER — PHENYLEPHRINE 40 MCG/ML (10ML) SYRINGE FOR IV PUSH (FOR BLOOD PRESSURE SUPPORT)
PREFILLED_SYRINGE | INTRAVENOUS | Status: DC | PRN
  Administered 2020-10-03 (×2): 80 ug via INTRAVENOUS

## 2020-10-03 MED ORDER — LIDOCAINE HCL (PF) 1 % IJ SOLN
INTRAMUSCULAR | Status: DC | PRN
  Administered 2020-10-03: 10 mL

## 2020-10-03 MED ORDER — IBUPROFEN 600 MG PO TABS
600.0000 mg | ORAL_TABLET | Freq: Four times a day (QID) | ORAL | 3 refills | Status: DC | PRN
Start: 2020-10-03 — End: 2020-11-07

## 2020-10-03 MED ORDER — ONDANSETRON HCL 4 MG/2ML IJ SOLN
INTRAMUSCULAR | Status: AC
  Filled 2020-10-03: qty 2

## 2020-10-03 MED ORDER — FENTANYL CITRATE (PF) 100 MCG/2ML IJ SOLN
INTRAMUSCULAR | Status: AC
  Filled 2020-10-03: qty 2

## 2020-10-03 SURGICAL SUPPLY — 20 items
BIPOLAR CUTTING LOOP 21FR (ELECTRODE)
CANISTER SUCT 3000ML PPV (MISCELLANEOUS) ×2 IMPLANT
CATH ROBINSON RED A/P 16FR (CATHETERS) IMPLANT
COVER WAND RF STERILE (DRAPES) ×2 IMPLANT
DEVICE MYOSURE REACH (MISCELLANEOUS) ×2 IMPLANT
DILATOR CANAL MILEX (MISCELLANEOUS) IMPLANT
ELECT REM PT RETURN 9FT ADLT (ELECTROSURGICAL)
ELECTRODE REM PT RTRN 9FT ADLT (ELECTROSURGICAL) IMPLANT
GLOVE SURG POLYISO LF SZ6.5 (GLOVE) ×2 IMPLANT
GLOVE SURG POLYISO LF SZ7 (GLOVE) ×2 IMPLANT
GOWN STRL REUS W/TWL LRG LVL3 (GOWN DISPOSABLE) ×2 IMPLANT
KIT PROCEDURE FLUENT (KITS) ×2 IMPLANT
KIT TURNOVER CYSTO (KITS) ×2 IMPLANT
LOOP CUTTING BIPOLAR 21FR (ELECTRODE) IMPLANT
PACK VAGINAL MINOR WOMEN LF (CUSTOM PROCEDURE TRAY) ×2 IMPLANT
PAD OB MATERNITY 4.3X12.25 (PERSONAL CARE ITEMS) ×2 IMPLANT
PAD PREP 24X48 CUFFED NSTRL (MISCELLANEOUS) ×2 IMPLANT
SEAL ROD LENS SCOPE MYOSURE (ABLATOR) ×2 IMPLANT
TOWEL OR 17X26 10 PK STRL BLUE (TOWEL DISPOSABLE) ×2 IMPLANT
WATER STERILE IRR 500ML POUR (IV SOLUTION) ×2 IMPLANT

## 2020-10-03 NOTE — Anesthesia Procedure Notes (Signed)
Procedure Name: LMA Insertion Date/Time: 10/03/2020 7:52 AM Performed by: Gwyndolyn Saxon, CRNA Pre-anesthesia Checklist: Patient identified, Emergency Drugs available, Suction available and Patient being monitored Patient Re-evaluated:Patient Re-evaluated prior to induction Oxygen Delivery Method: Circle system utilized Preoxygenation: Pre-oxygenation with 100% oxygen Induction Type: IV induction Ventilation: Mask ventilation without difficulty LMA: LMA inserted LMA Size: 4.0 Number of attempts: 1 Airway Equipment and Method: Patient positioned with wedge pillow Placement Confirmation: positive ETCO2 and breath sounds checked- equal and bilateral Tube secured with: Tape Dental Injury: Teeth and Oropharynx as per pre-operative assessment

## 2020-10-03 NOTE — Anesthesia Postprocedure Evaluation (Signed)
Anesthesia Post Note  Patient: [name]  Procedure(s) Performed: DILATATION AND CURETTAGE /HYSTEROSCOPY WITH MYOSURE (N/A Vagina )     Patient location during evaluation: Phase II Anesthesia Type: General Level of consciousness: awake and alert, patient cooperative and oriented Pain management: pain level controlled Vital Signs Assessment: post-procedure vital signs reviewed and stable Respiratory status: spontaneous breathing, nonlabored ventilation and respiratory function stable Cardiovascular status: blood pressure returned to baseline and stable Postop Assessment: no apparent nausea or vomiting, able to ambulate and adequate PO intake Anesthetic complications: no   No complications documented.  Last Vitals:  Vitals:   10/03/20 0915 10/03/20 0930  BP: (!) 142/67 (!) 142/73  Pulse: 67 68  Resp: 15 15  Temp:  (!) 36.4 C  SpO2: 100% 100%    Last Pain:  Vitals:   10/03/20 0930  TempSrc:   PainSc: 0-No pain                 Samin Milke,E. Reshunda Strider

## 2020-10-03 NOTE — Transfer of Care (Signed)
Immediate Anesthesia Transfer of Care Note  Patient: [name]  Procedure(s) Performed: DILATATION AND CURETTAGE Pollyann Glen WITH MYOSURE (N/A Vagina )  Patient Location: PACU  Anesthesia Type:General  Level of Consciousness: drowsy and patient cooperative  Airway & Oxygen Therapy: Patient Spontanous Breathing and Patient connected to nasal cannula oxygen  Post-op Assessment: Report given to RN and Post -op Vital signs reviewed and stable  Post vital signs: Reviewed and stable  Last Vitals:  Vitals Value Taken Time  BP 161/75 10/03/20 0832  Temp    Pulse 87 10/03/20 0832  Resp 15 10/03/20 0834  SpO2 100 % 10/03/20 0832  Vitals shown include unvalidated device data.  Last Pain:  Vitals:   10/03/20 0543  TempSrc: Oral  PainSc: 0-No pain         Complications: No complications documented.

## 2020-10-03 NOTE — Discharge Instructions (Signed)
Atlas of pelvic anatomy and gynecologic surgery (4th ed., pp. 205-212). [place], [place]: Elsevier.">  Dilation and Curettage or Vacuum Curettage Dilation and curettage (D&C) and vacuum curettage are minor procedures. A D&C involves stretching the cervix (dilation) and scraping the inside lining of the uterus with surgical instruments (curettage). During a D&C, tissue is gently scraped from the lining of the uterus (endometrium), starting from the top portion of the uterus down to the lowest part of the uterus. During a vacuum curettage, the lining and tissue in the uterus are removed with the use of gentle suction. Curettage may be performed to either diagnose or treat a problem. For diagnosis A diagnostic curettage may be done if you have:  Irregular bleeding in the uterus.  Bleeding with the development of clots.  Spotting between menstrual periods.  Prolonged menstrual periods or other abnormal bleeding.  Bleeding after menopause.  No menstrual period (amenorrhea).  A change in size and shape of the uterus.  Abnormal endometrial cells discovered during a Pap test. For treatment Curettage may be done:  To remove an IUD (intrauterine device).  To remove remaining placenta after giving birth.  During an abortion.  During a miscarriage.  To remove growths in the lining of the uterus.  To remove some rare types of non-cancerous lumps (fibroids). Tell a health care provider about:  Any allergies you have, including allergies to prescribed medicine or latex.  All medicines you are taking, including vitamins, herbs, eye drops, creams, and over-the-counter medicines.  Any blood-thinning medicine you may be taking.  Any problems you or family members have had with anesthetic medicines.  Any blood disorders you have.  Any surgeries you have had.  Your medical history and any medical conditions you have.  Whether you are pregnant or may be pregnant.  Recent vaginal  infections you have had.  Recent menstrual periods, bleeding problems you have had, and what form of birth control (contraception) you use. What are the risks? Generally, this is a safe procedure. However, problems may occur, including:  Infection.  Heavy vaginal bleeding.  Allergic reactions to medicines.  Damage to the cervix or other structures or organs.  Development of scar tissue (adhesions) inside the uterus. This can cause abnormal periods and may make it harder to get pregnant.  A hole (perforation) in the wall of the uterus. This is rare. What happens before the procedure? Staying hydrated Follow instructions from your health care provider about hydration, which may include:  Up to 2 hours before the procedure - you may continue to drink clear liquids, such as water, clear fruit juice, black coffee, and plain tea.   Eating and drinking restrictions Follow instructions from your health care provider about eating and drinking, which may include:  8 hours before the procedure - stop eating heavy meals or foods, such as meat, fried foods, or fatty foods.  6 hours before the procedure - stop eating light meals or foods, such as toast or cereal.  6 hours before the procedure - stop drinking milk or drinks that contain milk.  2 hours before the procedure - stop drinking clear liquids. If your health care provider told you to take your medicine(s) on the day of your procedure, take them with only a sip of water. Medicines  Ask your health care provider about: ? Changing or stopping your regular medicines. This is especially important if you are taking diabetes medicines or blood thinners. ? Taking medicines such as aspirin and ibuprofen. These medicines can  thin your blood. Do not take these medicines unless your health care provider tells you to take them. ? Taking over-the-counter medicines, vitamins, herbs, and supplements.  You may be given a medicine to soften the cervix  in order to help with dilation. Surgery safety Ask your health care provider what steps will be taken to help prevent infection. These may include:  Removing hair at the surgery site.  Washing skin with a germ-killing soap.  Taking antibiotic medicine. General instructions  Do not use any products that contain nicotine or tobacco for at least 4 weeks before the procedure. These products include cigarettes, e-cigarettes, and chewing tobacco. If you need help quitting, ask your health care provider.  For 24 hours before your procedure, do not: ? Douche. ? Use tampons. ? Use medicines, creams, or suppositories in the vagina. ? Have sex.  You may be given a pregnancy test on the day of the procedure.  You may have a blood or urine sample taken.  Plan to have someone take you home from the hospital or clinic.  If you will be going home right after the procedure, plan to have someone with you for 24 hours. What happens during the procedure?  An IV will be inserted into one of your veins.  You will be given one of the following: ? A medicine that numbs the area in and around the cervix (local anesthetic). ? A medicine to make you fall asleep (general anesthetic).  You will lie down on your back, with your feet in foot rests (stirrups).  The size and position of your uterus will be checked.  A lubricated instrument (speculum or Sims retractor) will be inserted into the back side of your vagina. The speculum will be used to hold apart the walls of your vagina so your health care provider can see your cervix.  A tool (tenaculum) will be attached to the lip of the cervix to stabilize it.  Your cervix will be softened and dilated. This may be done by: ? Taking medicine, either orally or vaginally. ? Having thin rods (laminaria) or gradual widening instruments (tapered dilators) inserted into your cervix.  A small, sharp, curved instrument (curette) will be used to scrape a small  amount of tissue or cells from the endometrium or cervical canal. In some cases, gentle suction is applied with the curette.  The curette will then be removed.  The cells will be taken to a lab for testing. The procedure may vary among health care providers and hospitals.   What happens after the procedure?  Your blood pressure, heart rate, breathing rate, and blood oxygen level will be monitored until you leave the hospital or clinic.  You may have mild cramping, backache, pain, and light bleeding or spotting. You may pass small blood clots from your vagina.  You may have to wear compression stockings. These stockings help to prevent blood clots and reduce swelling in your legs. Summary  Dilation and curettage (D&C) involves stretching (dilating) the cervix and scraping the inside lining of the uterus (curettage).  Follow your health care provider's instructions about when to stop eating and drinking, and whether to stop or change any medicines.  After the procedure, you may have mild cramping, backache, pain, and light bleeding or spotting. You may pass small blood clots from your vagina.  Plan to have someone take you home from the hospital or clinic. This information is not intended to replace advice given to you by your health care  provider. Make sure you discuss any questions you have with your health care provider. Document Revised: 08/24/2019 Document Reviewed: 08/24/2019 Elsevier Patient Education  2021 [place]: D&C / D&E The following instructions have been prepared to help you care for yourself upon your return home.   Personal hygiene: Marland Kitchen Use sanitary pads for vaginal drainage, not tampons. . Shower the day after your procedure. . NO tub baths, pools or Jacuzzis for 2-3 weeks. . Wipe front to back after using the bathroom.  Activity and limitations: . Do NOT drive or operate any equipment for 24 hours. The effects of anesthesia are still  present and drowsiness may result. . Do NOT rest in bed all day. . Walking is encouraged. . Walk up and down stairs slowly. . You may resume your normal activity in one to two days or as indicated by your physician.  Sexual activity: NO intercourse for at least 2 weeks after the procedure, or as indicated by your physician.  Diet: Eat a light meal as desired this evening. You may resume your usual diet tomorrow.  Return to work: You may resume your work activities in one to two days or as indicated by your doctor.  What to expect after your surgery: Expect to have vaginal bleeding/discharge for 2-3 days and spotting for up to 10 days. It is not unusual to have soreness for up to 1-2 weeks. You may have a slight burning sensation when you urinate for the first day. Mild cramps may continue for a couple of days. You may have a regular period in 2-6 weeks.  Call your doctor for any of the following: . Excessive vaginal bleeding, saturating and changing one pad every hour. . Inability to urinate 6 hours after discharge from hospital. . Pain not relieved by pain medication. . Fever of 100.4 F or greater. . Unusual vaginal discharge or odor.   Call for an appointment:       Post Anesthesia Home Care Instructions  Activity: Get plenty of rest for the remainder of the day. A responsible individual must stay with you for 24 hours following the procedure.  For the next 24 hours, DO NOT: -Drive a car -Paediatric nurse -Drink alcoholic beverages -Take any medication unless instructed by your physician -Make any legal decisions or sign important papers.  Meals: Start with liquid foods such as gelatin or soup. Progress to regular foods as tolerated. Avoid greasy, spicy, heavy foods. If nausea and/or vomiting occur, drink only clear liquids until the nausea and/or vomiting subsides. Call your physician if vomiting continues.  Special Instructions/Symptoms: Your throat may feel dry or  sore from the anesthesia or the breathing tube placed in your throat during surgery. If this causes discomfort, gargle with warm salt water. The discomfort should disappear within 24 hours.  If you had a scopolamine patch placed behind your ear for the management of post- operative nausea and/or vomiting:  1. The medication in the patch is effective for 72 hours, after which it should be removed.  Wrap patch in a tissue and discard in the trash. Wash hands thoroughly with soap and water. 2. You may remove the patch earlier than 72 hours if you experience unpleasant side effects which may include dry mouth, dizziness or visual disturbances. 3. Avoid touching the patch. Wash your hands with soap and water after contact with the patch.

## 2020-10-03 NOTE — Op Note (Signed)
PREOPERATIVE DIAGNOSIS:  Thickened endometrium in the postmenopausal setting. POSTOPERATIVE DIAGNOSIS: The same PROCEDURE: Hysteroscopy, Dilation and Curettage, myosure SURGEON:  Dr. Mora Bellman   INDICATIONS: 75 y.o. [id number] postmenopausal here for scheduled surgery for thickened endometrium.   Risks of surgery were discussed with the patient including but not limited to: bleeding which may require transfusion; infection which may require antibiotics; injury to uterus or surrounding organs; intrauterine scarring which may impair future fertility; need for additional procedures including laparotomy or laparoscopy; and other postoperative/anesthesia complications. Written informed consent was obtained.    FINDINGS:  A 9 week size uterus.  Atrophic endometrium.  Normal ostia bilaterally. 4 cm polyp filling the endometrial cavity anchored on right side of the uterus. Sub centimeter polyp visualized on left side of the uterus  ANESTHESIA:   General, paracervical block. INTRAVENOUS FLUIDS:  700 ml of LR FLUID DEFICITS:  152ml of normal saline ESTIMATED BLOOD LOSS:  Less than 20 ml SPECIMENS: Endometrial curettings sent to pathology and polyp fragments COMPLICATIONS:  None immediate.  PROCEDURE DETAILS:  The patient was taken to the operating room where general anesthesia was administered and was found to be adequate.  After an adequate timeout was performed, she was placed in the dorsal lithotomy position and examined; then prepped and draped in the sterile manner.   Her bladder was catheterized for an unmeasured amount of clear, yellow urine. A speculum was then placed in the patient's vagina and a single tooth tenaculum was applied to the anterior lip of the cervix.   A paracervical block using 10 ml of 0.5% Marcaine was administered.  The cervix was sounded to 9 cm and dilated manually with Hagar dilators to accommodate the 5 mm diagnostic hysteroscope.  Once the cervix was dilated, the  hysteroscope was inserted under direct visualization using normal saline as a suspension medium.  The uterine cavity was carefully examined, both ostia were recognized, and an atrophic endometrium was noted along with the above findings.  The myosure was introduced into the operative scope and polypectomy was performed thereby removing both visualized polyps. After further careful visualization of the uterine cavity, the hysteroscope was removed under direct visualization.  A sharp curettage was then performed to obtain a moderate amount of endometrial curettings.  The tenaculum was removed from the anterior lip of the cervix and the vaginal speculum was removed after noting good hemostasis.  The patient tolerated the procedure well and was taken to the recovery area awake, extubated and in stable condition.

## 2020-10-04 ENCOUNTER — Encounter (HOSPITAL_BASED_OUTPATIENT_CLINIC_OR_DEPARTMENT_OTHER): Payer: Self-pay | Admitting: Obstetrics and Gynecology

## 2020-10-04 LAB — SURGICAL PATHOLOGY

## 2020-10-05 ENCOUNTER — Ambulatory Visit (INDEPENDENT_AMBULATORY_CARE_PROVIDER_SITE_OTHER): Payer: Medicare HMO

## 2020-10-05 DIAGNOSIS — Z0000 Encounter for general adult medical examination without abnormal findings: Secondary | ICD-10-CM

## 2020-10-05 NOTE — Patient Instructions (Addendum)
You spoke to [name], [place] over the phone for your annual wellness visit.  We discussed goals: Goals    . Exercise 3x per week (30 min per time)     Start walking 15-30 mins 3x per week with the nicer weather.    Marland Kitchen LDL CALC < 130      We also discussed recommended health maintenance. As discussed, you are up to date with everything! You have a Colonoscopy scheduled for 10/23/2020.  Health Maintenance  Topic Date Due  . COLONOSCOPY (Pts 45-63yr Insurance coverage will need to be confirmed)  Never done  . MAMMOGRAM  09/29/2022  . TETANUS/TDAP  11/30/2029  . INFLUENZA VACCINE  Completed  . DEXA SCAN  Completed  . COVID-19 Vaccine  Completed  . Hepatitis C Screening  Completed  . PNA vac Low Risk Adult  Completed  . HPV VACCINES  Aged Out   Fill out an advance directive. Colonoscopy apt scheduled for 10/23/2020. PCP apt scheduled for 11/07/2020. Start walking with the nicer weather!   Preventive Care 674Years and Older, Female Preventive care refers to lifestyle choices and visits with your health care provider that can promote health and wellness. This includes:  A yearly physical exam. This is also called an annual wellness visit.  Regular dental and eye exams.  Immunizations.  Screening for certain conditions.  Healthy lifestyle choices, such as: ? Eating a healthy diet. ? Getting regular exercise. ? Not using drugs or products that contain nicotine and tobacco. ? Limiting alcohol use. What can I expect for my preventive care visit? Physical exam Your health care provider will check your:  Height and weight. These may be used to calculate your BMI (body mass index). BMI is a measurement that tells if you are at a healthy weight.  Heart rate and blood pressure.  Body temperature.  Skin for abnormal spots. Counseling Your health care provider may ask you questions about your:  Past medical problems.  Family's medical history.  Alcohol, tobacco, and  drug use.  Emotional well-being.  Home life and relationship well-being.  Sexual activity.  Diet, exercise, and sleep habits.  History of falls.  Memory and ability to understand (cognition).  Work and work eStatistician  Pregnancy and menstrual history.  Access to firearms. What immunizations do I need? Vaccines are usually given at various ages, according to a schedule. Your health care provider will recommend vaccines for you based on your age, medical history, and lifestyle or other factors, such as travel or where you work.   What tests do I need? Blood tests  Lipid and cholesterol levels. These may be checked every 5 years, or more often depending on your overall health.  Hepatitis C test.  Hepatitis B test. Screening  Lung cancer screening. You may have this screening every year starting at age 17577if you have a 30-pack-year history of smoking and currently smoke or have quit within the past 15 years.  Colorectal cancer screening. ? All adults should have this screening starting at age 17578and continuing until age 75 ? Your health care provider may recommend screening at age 6662if you are at increased risk. ? You will have tests every 1-10 years, depending on your results and the type of screening test.  Diabetes screening. ? This is done by checking your blood sugar (glucose) after you have not eaten for a while (fasting). ? You may have this done every 1-3 years.  Mammogram. ? This may be  done every 1-2 years. ? Talk with your health care provider about how often you should have regular mammograms.  Abdominal aortic aneurysm (AAA) screening. You may need this if you are a current or former smoker.  BRCA-related cancer screening. This may be done if you have a family history of breast, ovarian, tubal, or peritoneal cancers. Other tests  STD (sexually transmitted disease) testing, if you are at risk.  Bone density scan. This is done to screen for  osteoporosis. You may have this done starting at age 15. Talk with your health care provider about your test results, treatment options, and if necessary, the need for more tests. Follow these instructions at home: Eating and drinking  Eat a diet that includes fresh fruits and vegetables, whole grains, lean protein, and low-fat dairy products. Limit your intake of foods with high amounts of sugar, saturated fats, and salt.  Take vitamin and mineral supplements as recommended by your health care provider.  Do not drink alcohol if your health care provider tells you not to drink.  If you drink alcohol: ? Limit how much you have to 0-1 drink a day. ? Be aware of how much alcohol is in your drink. In the [place] one drink equals one 12 oz bottle of beer (355 mL), one 5 oz glass of wine (148 mL), or one 1 oz glass of hard liquor (44 mL).   Lifestyle  Take daily care of your teeth and gums. Brush your teeth every morning and night with fluoride toothpaste. Floss one time each day.  Stay active. Exercise for at least 30 minutes 5 or more days each week.  Do not use any products that contain nicotine or tobacco, such as cigarettes, e-cigarettes, and chewing tobacco. If you need help quitting, ask your health care provider.  Do not use drugs.  If you are sexually active, practice safe sex. Use a condom or other form of protection in order to prevent STIs (sexually transmitted infections).  Talk with your health care provider about taking a low-dose aspirin or statin.  Find healthy ways to cope with stress, such as: ? Meditation, yoga, or listening to music. ? Journaling. ? Talking to a trusted person. ? Spending time with friends and family. Safety  Always wear your seat belt while driving or riding in a vehicle.  Do not drive: ? If you have been drinking alcohol. Do not ride with someone who has been drinking. ? When you are tired or distracted. ? While texting.  Wear a helmet and  other protective equipment during sports activities.  If you have firearms in your house, make sure you follow all gun safety procedures. What's next?  Visit your health care provider once a year for an annual wellness visit.  Ask your health care provider how often you should have your eyes and teeth checked.  Stay up to date on all vaccines. This information is not intended to replace advice given to you by your health care provider. Make sure you discuss any questions you have with your health care provider. Document Revised: 07/12/2020 Document Reviewed: 07/16/2018 Elsevier Patient Education  2021 [place].   Our clinic's number is [phone]. Please call with questions or concerns about what we discussed today.

## 2020-10-05 NOTE — Progress Notes (Signed)
Subjective:   [name] is a 75 y.o. female who presents for Medicare Annual (Subsequent) preventive examination.  Patient consented to have virtual visit and was identified by name and date of birth. Method of visit: Telephone  Encounter participants: Patient: [name] - located at Home Nurse/Provider: Dorna Bloom - located at [facility] Others (if applicable): NA Total time: 45 minutes   Review of Systems: Defer to PCP  Cardiac Risk Factors include: advanced age (>51men, >39 women);hypertension  Objective:   Vitals: There were no vitals taken for this visit.  There is no height or weight on file to calculate BMI.  Virtual visit- patient had no recent home vitals to report.  Advanced Directives 10/05/2020 10/03/2020 08/03/2020 07/06/2020 06/01/2020 05/02/2020 11/01/2018  Does Patient Have a Medical Advance Directive? No No No No No No No  Copy of Healthcare Power of Attorney in Chart? - - - - - - -  Would patient like information on creating a medical advance directive? Yes (MAU/Ambulatory/Procedural Areas - Information given) No - Patient declined No - Patient declined No - Patient declined No - Patient declined No - Patient declined -   Tobacco Social History   Tobacco Use  Smoking Status Never Smoker  Smokeless Tobacco Never Used     Clinical Intake:  Pre-visit preparation completed: Yes  Pain Score: 0-No pain  How often do you need to have someone help you when you read instructions, pamphlets, or other written materials from your doctor or pharmacy?: 2 - Rarely What is the last grade level you completed in school?: Occupational-Cosmotolgy  Interpreter Needed?: No  Past Medical History:  Diagnosis Date  . Ambulates with cane   . Anemia    hx of yrs ago  . DJD (degenerative joint disease) of hip   . GERD (gastroesophageal reflux disease)    h/o in the 1990s  . Heart murmur    mild no cardiologist  . Hyperlipidemia   . Hypertension    sees Dr. Gilda Crease  . Memory impairment    gets confused about medications history etc speak with son lynwood Malcolm cell [phone]  . Obesity (BMI 30-39.9)   . Pneumonia 10/2018  . 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 Recurrent pulmonary embolism [facility])    see pulmonologist Dr. Asencion Noble  . Sleep apnea    sleep study 2017 no cpap given  . Wears glasses for reading  . Wears partial dentures upper   Past Surgical History:  Procedure Laterality Date  . CARPAL TUNNEL RELEASE  09/02/2012   Procedure: CARPAL TUNNEL RELEASE;  Surgeon: Winfield Cunas, MD;  Location: [facility];  Service: Neurosurgery;  Laterality: Right;  RIGHT carpal tunnel release  . DILATION AND CURETTAGE OF UTERUS  02/21/2012   Procedure: DILATATION AND CURETTAGE;  Surgeon: Anabel Lykins Filbert, MD;  Location: [facility];  Service: Gynecology;  Laterality: N/A;  . Femoral vein ligation  12/2002  . [place] filter  11/03/2003   R Jugular  . HYSTEROSCOPY WITH D & C N/A 10/03/2020   Procedure: DILATATION AND CURETTAGE /HYSTEROSCOPY WITH MYOSURE;  Surgeon: Mora Bellman, MD;  Location: [facility];  Service: Gynecology;  Laterality: N/A;  . SHOULDER SURGERY  Left 07/15/08   Dr Hal Morales  . TOTAL HIP ARTHROPLASTY  R 05/2203, L 2010  . TRANSTHORACIC  ECHOCARDIOGRAM     EF 55-60%, RV mod dilated, mildly increased LV wall thickness  . TUBAL LIGATION  1981   Family History  Problem Relation Age of Onset  . Heart disease Mother   . Heart attack Mother   . Heart disease Father   . Heart attack Father   . Cancer Brother   . Cancer Sister   . Alcohol abuse Sister    Social History   Socioeconomic History  . Marital status: Widowed    Spouse name: Not on file  . Number of children: 2  . Years of education: College  . Highest education level: Associate degree: occupational,  Hotel manager, or vocational program  Occupational History    Employer: RETIRED  Tobacco Use  . Smoking status: Never Smoker  . Smokeless tobacco: Never Used  Vaping Use  . Vaping Use: Never used  Substance and Sexual Activity  . Alcohol use: No  . Drug use: No  . Sexual activity: Not Currently    Birth control/protection: None  Other Topics Concern  . Not on file  Social History Narrative   Patient lives with her son and adopted daughter with special needs.    Patient is active in her church and with her family.   Patient enjoys sewing, crocheting, cooking, and spending time with family.    Patient has 2 biological sons and 2 adopted daughters.          Social Determinants of Health   Financial Resource Strain: Low Risk   . Difficulty of Paying Living Expenses: Not hard at all  Food Insecurity: No Food Insecurity  . Worried About Charity fundraiser in the Last Year: Never true  . Ran Out of Food in the Last Year: Never true  Transportation Needs: No Transportation Needs  . Lack of Transportation (Medical): No  . Lack of Transportation (Non-Medical): No  Physical Activity: Inactive  . Days of Exercise per Week: 0 days  . Minutes of Exercise per Session: 0 min  Stress: No Stress Concern Present  . Feeling of Stress : Not at all  Social Connections: Moderately Isolated  . Frequency of Communication with Friends and Family: More than three times a week  . Frequency of Social Gatherings with Friends and Family: More than three times a week  . Attends Religious Services: More than 4 times per year  . Active Member of Clubs or Organizations: No  . Attends Archivist Meetings: Never  . Marital Status: Widowed   Outpatient Encounter Medications as of 10/05/2020  Medication Sig  . amLODipine (NORVASC) 5 MG tablet Take 1 tablet (5 mg total) by mouth at bedtime.  . Ascorbic Acid (VITAMIN C) 500 MG CAPS Take by mouth.  Marland Kitchen ibuprofen (ADVIL) 600 MG tablet Take 1 tablet (600 mg  total) by mouth every 6 (six) hours as needed.  Marland Kitchen oxyCODONE-acetaminophen (PERCOCET/ROXICET) 5-325 MG tablet Take 1 tablet by mouth every 6 (six) hours as needed.  Alveda Reasons 20 MG TABS tablet TAKE 1 TABLET BY MOUTH EVERY DAY (Patient taking differently: at bedtime.)   No facility-administered encounter medications on file as of 10/05/2020.   Activities of Daily Living In your present state of health, do you have any difficulty performing the following activities: 10/05/2020 10/03/2020  Hearing? N N  Vision? N N  Difficulty concentrating or making decisions? N N  Walking or climbing stairs? Y Y  Dressing or bathing? N N  Doing errands, shopping? N -  Conservation officer, nature  and eating ? N -  Using the Toilet? N -  In the past six months, have you accidently leaked urine? N -  Do you have problems with loss of bowel control? N -  Managing your Medications? N -  Managing your Finances? N -  Housekeeping or managing your Housekeeping? N -  Some recent data might be hidden   Patient Care Team: Leeanne Rio, MD as PCP - General (Pediatrics) Juanito Doom, MD as Consulting Physician (Pulmonary Disease) Mora Bellman, MD as Consulting Physician (Obstetrics and Gynecology)    Assessment:   This is a routine wellness examination for Joby.  Exercise Activities and Dietary recommendations Current Exercise Habits: The patient does not participate in regular exercise at present, Exercise limited by: orthopedic condition(s)  Goals    . Exercise 3x per week (30 min per time)     Start walking 15-30 mins 3x per week with the nicer weather.    Marland Kitchen LDL CALC < 130      Fall Risk Fall Risk  10/05/2020 08/03/2020 07/06/2020 05/02/2020 06/19/2018  Falls in the past year? 0 0 0 0 0  Number falls in past yr: - 0 0 0 -  Injury with Fall? - 0 0 0 -  Risk Factor Category  - - - - -  Risk for fall due to : - - - - -   Is the patient's home free of loose throw rugs in walkways, pet beds, electrical  cords, etc?   yes      Grab bars in the bathroom? yes      Handrails on the stairs?   yes      Adequate lighting?   yes  Patient rating of health (0-10) scale: 8   Depression Screen PHQ 2/9 Scores 10/05/2020 09/26/2020 08/03/2020 07/06/2020  PHQ - 2 Score 0 0 0 0  PHQ- 9 Score 0 0 3 1    Cognitive Function   Immunization History  Administered Date(s) Administered  . Influenza Split 08/15/2011  . Influenza Whole 05/06/2008, 08/24/2009, 05/05/2012  . Influenza, High Dose Seasonal PF 04/08/2018  . Influenza,inj,Quad PF,6+ Mos 04/22/2013, 08/15/2014, 05/19/2015, 05/05/2016, 05/09/2017  . Influenza-Unspecified 04/04/2020  . PFIZER(Purple Top)SARS-COV-2 Vaccination 02/03/2020, 02/24/2020, 06/26/2020  . Pneumococcal Conjugate-13 08/15/2014  . Pneumococcal Polysaccharide-23 07/05/2009, 05/09/2017  . Td 08/06/1999  . Tdap 12/01/2019   Screening Tests Health Maintenance  Topic Date Due  . COLONOSCOPY (Pts 45-58yrs Insurance coverage will need to be confirmed)  Never done  . MAMMOGRAM  09/29/2022  . TETANUS/TDAP  11/30/2029  . INFLUENZA VACCINE  Completed  . DEXA SCAN  Completed  . COVID-19 Vaccine  Completed  . Hepatitis C Screening  Completed  . PNA vac Low Risk Adult  Completed  . HPV VACCINES  Aged Out   Cancer Screenings: Lung: Low Dose CT Chest recommended if Age 23-80 years, 30 pack-year currently smoking OR have quit w/in 15years. Patient does not qualify. Breast:  Up to date on Mammogram? Yes   Up to date of Bone Density/Dexa? Yes Colorectal: Scheduled 10/23/2020  Additional Screenings: Hepatitis C Screening: Completed HIV Screening: Completed     Plan:  Fill out an advance directive. Colonoscopy apt scheduled for 10/23/2020. PCP apt scheduled for 11/07/2020. Start walking with the nicer weather!  I have personally reviewed and noted the following in the patient's chart:   . Medical and social history . Use of alcohol, tobacco or illicit drugs  . Current  medications and supplements . Functional  ability and status . Nutritional status . Physical activity . Advanced directives . List of other physicians . Hospitalizations, surgeries, and ER visits in previous 12 months . Vitals . Screenings to include cognitive, depression, and falls . Referrals and appointments  In addition, I have reviewed and discussed with patient certain preventive protocols, quality metrics, and best practice recommendations. A written personalized care plan for preventive services as well as general preventive health recommendations were provided to patient.  This visit was conducted virtually in the setting of the [place] pandemic.    Dorna Bloom, [place]  10/05/2020

## 2020-10-05 NOTE — Progress Notes (Signed)
I have reviewed this visit and agree with the documentation.   

## 2020-10-09 ENCOUNTER — Observation Stay (HOSPITAL_COMMUNITY)
Admission: EM | Admit: 2020-10-09 | Discharge: 2020-10-10 | Disposition: A | Discharge status: Home / Self Care | Payer: Medicare HMO | Attending: Obstetrics & Gynecology | Admitting: Obstetrics & Gynecology

## 2020-10-09 ENCOUNTER — Other Ambulatory Visit: Payer: Self-pay

## 2020-10-09 ENCOUNTER — Encounter (HOSPITAL_COMMUNITY): Payer: Self-pay | Admitting: Emergency Medicine

## 2020-10-09 DIAGNOSIS — Z79899 Other long term (current) drug therapy: Secondary | ICD-10-CM | POA: Insufficient documentation

## 2020-10-09 DIAGNOSIS — D6832 Hemorrhagic disorder due to extrinsic circulating anticoagulants: Secondary | ICD-10-CM

## 2020-10-09 DIAGNOSIS — I10 Essential (primary) hypertension: Secondary | ICD-10-CM | POA: Insufficient documentation

## 2020-10-09 DIAGNOSIS — I8290 Acute embolism and thrombosis of unspecified vein: Secondary | ICD-10-CM | POA: Diagnosis present

## 2020-10-09 DIAGNOSIS — R9389 Abnormal findings on diagnostic imaging of other specified body structures: Secondary | ICD-10-CM

## 2020-10-09 DIAGNOSIS — D689 Coagulation defect, unspecified: Secondary | ICD-10-CM | POA: Diagnosis present

## 2020-10-09 DIAGNOSIS — N950 Postmenopausal bleeding: Secondary | ICD-10-CM | POA: Diagnosis not present

## 2020-10-09 DIAGNOSIS — Z7901 Long term (current) use of anticoagulants: Secondary | ICD-10-CM | POA: Diagnosis not present

## 2020-10-09 DIAGNOSIS — T457X1A Poisoning by anticoagulant antagonists, vitamin K and other coagulants, accidental (unintentional), initial encounter: Secondary | ICD-10-CM

## 2020-10-09 DIAGNOSIS — D62 Acute posthemorrhagic anemia: Secondary | ICD-10-CM | POA: Diagnosis present

## 2020-10-09 DIAGNOSIS — Z20822 Contact with and (suspected) exposure to covid-19: Secondary | ICD-10-CM | POA: Insufficient documentation

## 2020-10-09 DIAGNOSIS — R9431 Abnormal electrocardiogram [ECG] [EKG]: Secondary | ICD-10-CM | POA: Diagnosis not present

## 2020-10-09 DIAGNOSIS — N939 Abnormal uterine and vaginal bleeding, unspecified: Secondary | ICD-10-CM

## 2020-10-09 DIAGNOSIS — N859 Noninflammatory disorder of uterus, unspecified: Secondary | ICD-10-CM | POA: Diagnosis not present

## 2020-10-09 HISTORY — DX: Coagulation defect, unspecified: D68.9

## 2020-10-09 HISTORY — DX: Poisoning by anticoagulant antagonists, vitamin k and other coagulants, accidental (unintentional), initial encounter: T45.7X1A

## 2020-10-09 LAB — CBC
HCT: 28.6 % — ABNORMAL LOW (ref 36.0–46.0)
Hemoglobin: 9.4 g/dL — ABNORMAL LOW (ref 12.0–15.0)
MCH: 29.7 pg (ref 26.0–34.0)
MCHC: 32.9 g/dL (ref 30.0–36.0)
MCV: 90.2 fL (ref 80.0–100.0)
Platelets: 272 10*3/uL (ref 150–400)
RBC: 3.17 MIL/uL — ABNORMAL LOW (ref 3.87–5.11)
RDW: 15.3 % (ref 11.5–15.5)
WBC: 8.6 10*3/uL (ref 4.0–10.5)
nRBC: 0 % (ref 0.0–0.2)

## 2020-10-09 LAB — CBC WITH DIFFERENTIAL/PLATELET
Abs Immature Granulocytes: 0.01 10*3/uL (ref 0.00–0.07)
Basophils Absolute: 0 10*3/uL (ref 0.0–0.1)
Basophils Relative: 1 %
Eosinophils Absolute: 0.1 10*3/uL (ref 0.0–0.5)
Eosinophils Relative: 2 %
HCT: 33.5 % — ABNORMAL LOW (ref 36.0–46.0)
Hemoglobin: 10.6 g/dL — ABNORMAL LOW (ref 12.0–15.0)
Immature Granulocytes: 0 %
Lymphocytes Relative: 42 %
Lymphs Abs: 3.1 10*3/uL (ref 0.7–4.0)
MCH: 28.7 pg (ref 26.0–34.0)
MCHC: 31.6 g/dL (ref 30.0–36.0)
MCV: 90.8 fL (ref 80.0–100.0)
Monocytes Absolute: 0.4 10*3/uL (ref 0.1–1.0)
Monocytes Relative: 5 %
Neutro Abs: 3.8 10*3/uL (ref 1.7–7.7)
Neutrophils Relative %: 50 %
Platelets: 300 10*3/uL (ref 150–400)
RBC: 3.69 MIL/uL — ABNORMAL LOW (ref 3.87–5.11)
RDW: 15.2 % (ref 11.5–15.5)
WBC: 7.5 10*3/uL (ref 4.0–10.5)
nRBC: 0 % (ref 0.0–0.2)

## 2020-10-09 LAB — BASIC METABOLIC PANEL
Anion gap: 13 (ref 5–15)
BUN: 20 mg/dL (ref 8–23)
CO2: 20 mmol/L — ABNORMAL LOW (ref 22–32)
Calcium: 9.1 mg/dL (ref 8.9–10.3)
Chloride: 107 mmol/L (ref 98–111)
Creatinine, Ser: 1.52 mg/dL — ABNORMAL HIGH (ref 0.44–1.00)
GFR, Estimated: 36 mL/min — ABNORMAL LOW (ref >60)
Glucose, Bld: 114 mg/dL — ABNORMAL HIGH (ref 70–99)
Potassium: 3.3 mmol/L — ABNORMAL LOW (ref 3.5–5.1)
Sodium: 140 mmol/L (ref 135–145)

## 2020-10-09 LAB — TYPE AND SCREEN
ABO/RH(D): AB POS
ABO/RH(D): AB POS
Antibody Screen: NEGATIVE
Antibody Screen: NEGATIVE

## 2020-10-09 LAB — HEMOGLOBIN AND HEMATOCRIT, BLOOD
HCT: 33.6 % — ABNORMAL LOW (ref 36.0–46.0)
Hemoglobin: 10.8 g/dL — ABNORMAL LOW (ref 12.0–15.0)

## 2020-10-09 LAB — SARS CORONAVIRUS 2 (TAT 6-24 HRS): SARS Coronavirus 2: NEGATIVE

## 2020-10-09 MED ORDER — SODIUM CHLORIDE 0.9% FLUSH: 3.0000 mL | Freq: Two times a day (BID) | INTRAVENOUS | Status: DC

## 2020-10-09 MED ORDER — ALUM & MAG HYDROXIDE-SIMETH 200-200-20 MG/5ML PO SUSP: 30.0000 mL | ORAL | Status: DC | PRN

## 2020-10-09 MED ORDER — TRANEXAMIC ACID 650 MG PO TABS
1300.0000 mg | ORAL_TABLET | Freq: Three times a day (TID) | ORAL | Status: DC
  Administered 2020-10-09 – 2020-10-10 (×4): 1300 mg via ORAL
  Filled 2020-10-09 (×5): qty 2

## 2020-10-09 MED ORDER — MEGESTROL ACETATE 40 MG PO TABS
80.0000 mg | ORAL_TABLET | Freq: Two times a day (BID) | ORAL | Status: DC
  Administered 2020-10-09 – 2020-10-10 (×3): 80 mg via ORAL
  Filled 2020-10-09 (×3): qty 2

## 2020-10-09 MED ORDER — SODIUM CHLORIDE 0.9 % IV SOLN
250.0000 mL | INTRAVENOUS | Status: DC | PRN
Start: 2020-10-09 — End: 2020-10-10

## 2020-10-09 MED ORDER — ZOLPIDEM TARTRATE 5 MG PO TABS: 5.0000 mg | ORAL_TABLET | Freq: Every evening | ORAL | Status: DC | PRN

## 2020-10-09 MED ORDER — MAGNESIUM HYDROXIDE 400 MG/5ML PO SUSP: 30.0000 mL | Freq: Every day | ORAL | Status: DC | PRN

## 2020-10-09 MED ORDER — ONDANSETRON HCL 4 MG PO TABS: 4.0000 mg | ORAL_TABLET | Freq: Four times a day (QID) | ORAL | Status: DC | PRN

## 2020-10-09 MED ORDER — OXYCODONE-ACETAMINOPHEN 5-325 MG PO TABS: 1.0000 | ORAL_TABLET | Freq: Four times a day (QID) | ORAL | Status: DC | PRN

## 2020-10-09 MED ORDER — IBUPROFEN 600 MG PO TABS: 600.0000 mg | ORAL_TABLET | Freq: Four times a day (QID) | ORAL | Status: DC | PRN

## 2020-10-09 MED ORDER — RIVAROXABAN 20 MG PO TABS
20.0000 mg | ORAL_TABLET | Freq: Every day | ORAL | Status: DC
Start: 2020-10-09 — End: 2020-10-10
  Administered 2020-10-09 – 2020-10-10 (×2): 20 mg via ORAL
  Filled 2020-10-09 (×2): qty 1

## 2020-10-09 MED ORDER — HYDROMORPHONE HCL 1 MG/ML IJ SOLN: 0.2000 mg | INTRAMUSCULAR | Status: DC | PRN

## 2020-10-09 MED ORDER — SODIUM CHLORIDE 0.9% FLUSH: 3.0000 mL | INTRAVENOUS | Status: DC | PRN

## 2020-10-09 MED ORDER — ONDANSETRON HCL 4 MG/2ML IJ SOLN: 4.0000 mg | Freq: Four times a day (QID) | INTRAMUSCULAR | Status: DC | PRN

## 2020-10-09 MED ORDER — MAGNESIUM CITRATE PO SOLN
1.0000 | Freq: Once | ORAL | Status: DC | PRN
  Filled 2020-10-09: qty 296

## 2020-10-09 MED ORDER — BISACODYL 5 MG PO TBEC: 5.0000 mg | DELAYED_RELEASE_TABLET | Freq: Every day | ORAL | Status: DC | PRN

## 2020-10-09 MED ORDER — ASCORBIC ACID 500 MG PO TABS
500.0000 mg | ORAL_TABLET | Freq: Every day | ORAL | Status: DC
  Administered 2020-10-10: 500 mg via ORAL
  Filled 2020-10-09 (×2): qty 1

## 2020-10-09 MED ORDER — DOCUSATE SODIUM 100 MG PO CAPS
100.0000 mg | ORAL_CAPSULE | Freq: Two times a day (BID) | ORAL | Status: DC
  Administered 2020-10-09 – 2020-10-10 (×3): 100 mg via ORAL
  Filled 2020-10-09 (×3): qty 1

## 2020-10-09 MED ORDER — AMLODIPINE BESYLATE 5 MG PO TABS
5.0000 mg | ORAL_TABLET | Freq: Every day | ORAL | Status: DC
  Administered 2020-10-09: 5 mg via ORAL
  Filled 2020-10-09: qty 1

## 2020-10-09 MED ORDER — POTASSIUM CHLORIDE CRYS ER 20 MEQ PO TBCR
20.0000 meq | EXTENDED_RELEASE_TABLET | Freq: Two times a day (BID) | ORAL | Status: DC
  Administered 2020-10-09 – 2020-10-10 (×3): 20 meq via ORAL
  Filled 2020-10-09 (×3): qty 1

## 2020-10-09 NOTE — H&P (Signed)
Faculty Practice Gynecology Attending History and Physical  [name] is a 75 y.o. [id number] who presented to [facility] ER with vaginal bleeding, presyncopal symptoms after undergoing Hysteroscopy, D&C on 10/03/20 for postmenopausal bleeding. Pathology showed benign endometrial polyp.  Patient is on Xarelto for history of recurrent VTE.  Hgb noted to be 10.6 from 13.5.  Was started on TXA (after Hematology clearance) and Megace.  Her bleeding decreased.  Given her bleeding, anemia and presyncope, observation was recommended. She was transferred to [facility]. Upon arrival here, she reports markedly decreased bleeding. No further lightheadedness or other presyncopal symptoms. She denies any chest pain, SOB, fevers, chills, sweats, dysuria, nausea, vomiting, other GI or GU symptoms or other general symptoms.  Past Medical History:  Diagnosis Date  . Ambulates with cane   . Anemia    hx of yrs ago  . DJD (degenerative joint disease) of hip   . GERD (gastroesophageal reflux disease)    h/o in the 1990s  . Heart murmur    mild no cardiologist  . Hyperlipidemia   . Hypertension    sees Dr. Gilda Crease  . Memory impairment    gets confused about medications history etc speak with son lynwood Kloeppel cell [phone]  . Obesity (BMI 30-39.9)   . Pneumonia 10/2018  . 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 Recurrent pulmonary embolism [facility])    see pulmonologist Dr. Asencion Noble  . Sleep apnea    sleep study 2017 no cpap given  . Wears glasses for reading  . Wears partial dentures upper   Past Surgical History:  Procedure Laterality Date  . CARPAL TUNNEL RELEASE  09/02/2012   Procedure: CARPAL TUNNEL RELEASE;  Surgeon: Winfield Cunas, MD;  Location: [place] NEURO ORS;  Service: Neurosurgery;  Laterality: Right;  RIGHT carpal tunnel release  . DILATION AND  CURETTAGE OF UTERUS  02/21/2012   Procedure: DILATATION AND CURETTAGE;  Surgeon: Emily Filbert, MD;  Location: [place] ORS;  Service: Gynecology;  Laterality: N/A;  . Femoral vein ligation  12/2002  . Greenfield filter  11/03/2003   R Jugular  . HYSTEROSCOPY WITH D & C N/A 10/03/2020   Procedure: DILATATION AND CURETTAGE /HYSTEROSCOPY WITH MYOSURE;  Surgeon: Mora Bellman, MD;  Location: [place];  Service: Gynecology;  Laterality: N/A;  . SHOULDER SURGERY  Left 07/15/08   Dr Hal Morales  . TOTAL HIP ARTHROPLASTY  R 05/2203, L 2010  . TRANSTHORACIC ECHOCARDIOGRAM     EF 55-60%, RV mod dilated, mildly increased LV wall thickness  . TUBAL LIGATION  1981   OB History  Gravida Para Term Preterm AB Living  5 2 2   3 2   SAB IAB Ectopic Multiple Live Births  3            # Outcome Date GA Lbr Len/2nd Weight Sex Delivery Anes PTL Lv  5 Term           4 Term           3 SAB           2 SAB           1 SAB           Patient denies any other pertinent gynecologic issues.   No current facility-administered medications on file prior to encounter.  Current Outpatient Medications on File Prior to Encounter  Medication Sig Dispense Refill  . amLODipine (NORVASC) 5 MG tablet Take 1 tablet (5 mg total) by mouth at bedtime. 90 tablet 1  . Ascorbic Acid (VITAMIN C) 500 MG CAPS Take 500 mg by mouth daily.    Marland Kitchen ibuprofen (ADVIL) 600 MG tablet Take 1 tablet (600 mg total) by mouth every 6 (six) hours as needed. (Patient taking differently: Take 600 mg by mouth every 6 (six) hours as needed for fever, headache or mild pain.) 60 tablet 3  . oxyCODONE-acetaminophen (PERCOCET/ROXICET) 5-325 MG tablet Take 1 tablet by mouth every 6 (six) hours as needed. (Patient taking differently: Take 1 tablet by mouth every 6 (six) hours as needed for moderate pain.) 15 tablet 0  . XARELTO 20 MG TABS tablet TAKE 1 TABLET BY MOUTH EVERY DAY (Patient taking differently: Take 20 mg by mouth daily.) 90 tablet 3   No  Known Allergies  Social History:   reports that she has never smoked. She has never used smokeless tobacco. She reports that she does not drink alcohol and does not use drugs. Family History  Problem Relation Age of Onset  . Heart disease Mother   . Heart attack Mother   . Heart disease Father   . Heart attack Father   . Cancer Brother   . Cancer Sister   . Alcohol abuse Sister     Review of Systems: Pertinent items noted in HPI and remainder of comprehensive ROS otherwise negative.  PHYSICAL EXAM: Blood pressure 133/67, pulse 80, temperature 97.8 F (36.6 C), temperature source Oral, resp. rate 16, SpO2 100 %. CONSTITUTIONAL: Well-developed, well-nourished female in no acute distress.  SKIN: Skin is warm and dry. No rash noted. Not diaphoretic. No erythema. No pallor. NEUROLOGIC: Alert and oriented to person, place, and time. Normal reflexes, muscle tone coordination. No cranial nerve deficit noted. PSYCHIATRIC: Normal mood and affect. Normal behavior. Normal judgment and thought content. CARDIOVASCULAR: Normal heart rate noted, regular rhythm RESPIRATORY: Effort and breath sounds normal, no problems with respiration noted ABDOMEN: Soft, nontender, nondistended. PELVIC: Age appropriate atrophic external genitalia; minimal blood on pad. Exam done with RN present.  MUSCULOSKELETAL: Normal range of motion. No tenderness.  No cyanosis, clubbing, or edema.  2+ distal pulses.  Labs: Results for orders placed or performed during the hospital encounter of 10/09/20 (from the past 336 hour(s))  Basic metabolic panel   Collection Time: 10/09/20  1:55 AM  Result Value Ref Range   Sodium 140 135 - 145 mmol/L   Potassium 3.3 (L) 3.5 - 5.1 mmol/L   Chloride 107 98 - 111 mmol/L   CO2 20 (L) 22 - 32 mmol/L   Glucose, Bld 114 (H) 70 - 99 mg/dL   BUN 20 8 - 23 mg/dL   Creatinine, Ser 1.52 (H) 0.44 - 1.00 mg/dL   Calcium 9.1 8.9 - 10.3 mg/dL   GFR, Estimated 36 (L) >60 mL/min   Anion gap 13 5  - 15  CBC with Differential   Collection Time: 10/09/20  1:55 AM  Result Value Ref Range   WBC 7.5 4.0 - 10.5 K/uL   RBC 3.69 (L) 3.87 - 5.11 MIL/uL   Hemoglobin 10.6 (L) 12.0 - 15.0 g/dL   HCT 33.5 (L) 36.0 - 46.0 %   MCV 90.8 80.0 - 100.0 fL   MCH 28.7 26.0 - 34.0 pg   MCHC 31.6 30.0 - 36.0 g/dL   RDW 15.2 11.5 - 15.5 %  Platelets 300 150 - 400 K/uL   nRBC 0.0 0.0 - 0.2 %   Neutrophils Relative % 50 %   Neutro Abs 3.8 1.7 - 7.7 K/uL   Lymphocytes Relative 42 %   Lymphs Abs 3.1 0.7 - 4.0 K/uL   Monocytes Relative 5 %   Monocytes Absolute 0.4 0.1 - 1.0 K/uL   Eosinophils Relative 2 %   Eosinophils Absolute 0.1 0.0 - 0.5 K/uL   Basophils Relative 1 %   Basophils Absolute 0.0 0.0 - 0.1 K/uL   Immature Granulocytes 0 %   Abs Immature Granulocytes 0.01 0.00 - 0.07 K/uL  Type and screen [place]   Collection Time: 10/09/20  1:55 AM  Result Value Ref Range   ABO/RH(D) AB POS    Antibody Screen NEG    Sample Expiration      10/12/2020,2359 Performed at [facility], [place] [address] [place], [place] [address]   Hemoglobin and hematocrit, blood   Collection Time: 10/09/20  5:05 AM  Result Value Ref Range   Hemoglobin 10.8 (L) 12.0 - 15.0 g/dL   HCT 33.6 (L) 36.0 - 46.0 %  SARS CORONAVIRUS 2 (TAT 6-24 HRS) Nasopharyngeal Nasopharyngeal Swab   Collection Time: 10/09/20  6:22 AM   Specimen: Nasopharyngeal Swab  Result Value Ref Range   SARS Coronavirus 2 NEGATIVE NEGATIVE  Results for orders placed or performed during the hospital encounter of 10/03/20 (from the past 336 hour(s))  Type and screen   Collection Time: 10/03/20  6:22 AM  Result Value Ref Range   ABO/RH(D) AB POS    Antibody Screen NEG    Sample Expiration      10/06/2020,2359 Performed at [facility], [place] [address], [place], [place] [address]   Surgical pathology   Collection Time: 10/03/20  8:04 AM  Result Value Ref Range   SURGICAL PATHOLOGY       SURGICAL PATHOLOGY CASE: [id number] PATIENT: Katrine Coho Surgical Pathology Report     Clinical History: Thickened endometrium (jmc)     FINAL MICROSCOPIC DIAGNOSIS:  A. ENDOMETRIAL POLYP, RESECTION: - Multiple fragments of endometrial polyp. - No hyperplasia or carcinoma.  B. ENDOMETRIAL CURETTINGS, CURETTAGE: - Scanty atrophic glands and benign cervical mucosa. - No atypia or carcinoma.    GROSS DESCRIPTION:  A: Received fresh are portions of soft tan-white tissue measuring 4.5 x 4.5 x 2 cm in aggregate.  Sections are submitted in 5 cassettes.  B: Received fresh are 1.5 x 1.5 x 0.3 cm of soft tan-red tissue and clotted blood.  The specimen is submitted in toto.  [facility] 10/03/2020)   Final Diagnosis performed by Claudette Laws, MD.   Electronically signed 10/04/2020 Technical component performed at [place] [address], [place], [place] [address].  Professional component performed at [employer]. [facility], [place] [address], [place], [place] [address].  Immunohistochemistry Technical component (if applicable) was performed at [facility]. [address] [place], [place], [place] [address].   IMMUNOHISTOCHEMISTRY DISCLAIMER (if applicable): Some of these immunohistochemical stains may have been developed and the performance characteristics determine by [facility]. Some may not have been cleared or approved by the U.S. Food and Drug Administration. The FDA has determined that such clearance or approval is not necessary. This test is used for clinical purposes. It should not be regarded as investigational or for research. This laboratory is certified under the [place] (CLIA-88) as  qualified to perform high complexity clinical laboratory testing.  The controls stained appropriately.   Results for orders placed or performed during the hospital encounter of  09/29/20 (from the past 336 hour(s))  SARS CORONAVIRUS 2 (TAT 6-24 HRS) Nasopharyngeal Nasopharyngeal Swab   Collection Time: 09/29/20 12:20 PM   Specimen: Nasopharyngeal Swab  Result Value Ref Range   SARS Coronavirus 2 NEGATIVE NEGATIVE    Imaging Studies: MM Digital Diagnostic Unilat R  Result Date: 09/29/2020 CLINICAL DATA:  Screening recall for right breast calcifications. EXAM: DIGITAL DIAGNOSTIC UNILATERAL RIGHT MAMMOGRAM TECHNIQUE: Right digital diagnostic mammography was performed. Mammographic images were processed with CAD. COMPARISON:  Previous exams. ACR Breast Density Category c: The breast tissue is heterogeneously dense, which may obscure small masses. FINDINGS: Spot compression magnification views were performed over the slightly inner right breast. There is a 0.9 cm group of branching calcifications, indeterminate in appearance. IMPRESSION: Suspicious 0.9 cm group of calcifications in the slightly inner right breast. RECOMMENDATION: Recommend stereotactic guided biopsy of the calcifications in the right breast. I have discussed the findings and recommendations with the patient. If applicable, a reminder letter will be sent to the patient regarding the next appointment. BI-RADS CATEGORY  4: Suspicious. Electronically Signed   By: Everlean Alstrom M.D.   On: 09/29/2020 13:55   MM DIGITAL SCREENING BILATERAL  Result Date: 09/14/2020 CLINICAL DATA:  Screening. EXAM: DIGITAL SCREENING BILATERAL MAMMOGRAM WITH CAD TECHNIQUE: Bilateral screening digital craniocaudal and mediolateral oblique mammograms were obtained. The images were evaluated with computer-aided detection. COMPARISON:  Previous exam(s). ACR Breast Density Category d: The breast tissue is extremely dense, which lowers the sensitivity of mammography. FINDINGS: In the right breast, calcifications warrant further evaluation with magnified views. In the left breast, no findings suspicious for malignancy. The images were evaluated  with computer-aided detection. IMPRESSION: Further evaluation is suggested for calcifications in the right breast. RECOMMENDATION: Diagnostic mammogram of the right breast. (Code:[id number]-R-63M) The patient will be contacted regarding the findings, and additional imaging will be scheduled. BI-RADS CATEGORY  0: Incomplete. Need additional imaging evaluation and/or prior mammograms for comparison. Electronically Signed   By: Nolon Nations M.D.   On: 09/14/2020 15:17    Assessment: Principal Problem:   Postmenopausal bleeding Active Problems:   VTE (venous thromboembolism)   Thickened endometrium   Anticoagulant-induced bleeding (HCC)   Plan: Admit to [facility] Will recheck CBC and monitor bleeding Continue Megace and TXA as ordered for now Potassium repleted, recheck tomorrow morning Continue Xarelto Regular diet, encourage OOB as tolerated Routine floor care    Verita Schneiders, MD, [place], [facility] for [employer], [place]

## 2020-10-09 NOTE — ED Provider Notes (Signed)
Pt signed out by Dr. Rex Kras pending Gyn eval.  Dr. Harolyn Rutherford (gyn) recommends starting Megace and to transfer her over to [facility] for obs.  She said there is no plan for OR currently.  Pt given some OJ.  Pt remains stable for transfer.     Isla Pence, MD 10/09/20 [phone]

## 2020-10-09 NOTE — ED Provider Notes (Signed)
[place] DEPT Provider Note   CSN: [id number] Arrival date & time: 10/09/20  0145     History Chief Complaint  Patient presents with  . Vaginal Bleeding    [name] is a 75 y.o. female.  75 year old female with past medical history below including recurrent PE on Xarelto, hypertension, hyperlipidemia, GERD who presents with vaginal bleeding.  On 3/1, patient had D&C with hysteroscopy.  She has had mild spotting and bleeding since the procedure but nothing severe.  Tonight just prior to arrival, she went to use the bathroom and noted significant vaginal bleeding in the toilet.  She has continued to have heavy bleeding to the point that she had to use a towel between her legs. she denies any associated abdominal pain.  No fevers or vomiting.  The history is provided by the patient.  Vaginal Bleeding      Past Medical History:  Diagnosis Date  . Ambulates with cane   . Anemia    hx of yrs ago  . DJD (degenerative joint disease) of hip   . GERD (gastroesophageal reflux disease)    h/o in the 1990s  . Heart murmur    mild no cardiologist  . Hyperlipidemia   . Hypertension    sees Dr. Gilda Crease  . Memory impairment    gets confused about medications history etc speak with son lynwood Hammack cell [phone]  . Obesity (BMI 30-39.9)   . Pneumonia 10/2018  . 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 Recurrent pulmonary embolism [facility])    see pulmonologist Dr. Asencion Noble  . Sleep apnea    sleep study 2017 no cpap given  . Wears glasses for reading  . Wears partial dentures upper    Patient Active Problem List   Diagnosis Date Noted  . Thickened endometrium 05/31/2020  . Unintentional weight loss 05/07/2020  . Venous stasis dermatitis 12/21/2014  . Healthcare maintenance 08/15/2014  .  GERD (gastroesophageal reflux disease) 01/13/2014  . Dyspnea on exertion 01/10/2014  . Pain in joint, ankle and foot 07/16/2013  . Osteoarthritis of both knees 04/22/2013  . Ganglion cyst 02/14/2013  . Arm numbness 01/27/2013  . Radiculopathy affecting upper extremity 07/23/2012  . Shoulder pain, bilateral 05/05/2012  . Back pain 11/15/2011  . Numbness of fingers 11/15/2011  . Postmenopausal bleeding 09/04/2011  . VTE (venous thromboembolism) 08/24/2009  . OSTEOARTHRITIS, HIP, LEFT 04/07/2008  . CHRONIC KIDNEY DISEASE STAGE III (MODERATE) 03/25/2007  . Hyperlipidemia 10/02/2006  . OBESITY, NOS 10/02/2006  . ANEMIA, OTHER, UNSPECIFIED 10/02/2006  . Hypertension 10/02/2006    Past Surgical History:  Procedure Laterality Date  . CARPAL TUNNEL RELEASE  09/02/2012   Procedure: CARPAL TUNNEL RELEASE;  Surgeon: Winfield Cunas, MD;  Location: [place] NEURO ORS;  Service: Neurosurgery;  Laterality: Right;  RIGHT carpal tunnel release  . DILATION AND CURETTAGE OF UTERUS  02/21/2012   Procedure: DILATATION AND CURETTAGE;  Surgeon: Emily Filbert, MD;  Location: [place] [facility];  Service: Gynecology;  Laterality: N/A;  . Femoral vein ligation  12/2002  . Greenfield filter  11/03/2003   R Jugular  . HYSTEROSCOPY WITH D & C N/A 10/03/2020   Procedure: DILATATION AND CURETTAGE /HYSTEROSCOPY WITH MYOSURE;  Surgeon: Mora Bellman, MD;  Location: [place];  Service: Gynecology;  Laterality: N/A;  . SHOULDER SURGERY  Left 07/15/08   Dr Hal Morales  . TOTAL HIP ARTHROPLASTY  R 05/2203, L 2010  . TRANSTHORACIC ECHOCARDIOGRAM     EF 55-60%, RV mod dilated, mildly increased LV wall thickness  . TUBAL LIGATION  1981     OB History    Gravida  5   Para  2   Term  2   Preterm      AB  3   Living  2     SAB  3   IAB      Ectopic      Multiple      Live Births              Family History  Problem Relation Age of Onset  . Heart disease Mother   . Heart attack Mother   . Heart  disease Father   . Heart attack Father   . Cancer Brother   . Cancer Sister   . Alcohol abuse Sister     Social History   Tobacco Use  . Smoking status: Never Smoker  . Smokeless tobacco: Never Used  Vaping Use  . Vaping Use: Never used  Substance Use Topics  . Alcohol use: No  . Drug use: No    Home Medications Prior to Admission medications   Medication Sig Start Date End Date Taking? Authorizing Provider  amLODipine (NORVASC) 5 MG tablet Take 1 tablet (5 mg total) by mouth at bedtime. 07/06/20   Leeanne Rio, MD  Ascorbic Acid (VITAMIN C) 500 MG CAPS Take by mouth.    [provider]  ibuprofen (ADVIL) 600 MG tablet Take 1 tablet (600 mg total) by mouth every 6 (six) hours as needed. 10/03/20   Constant, Peggy, MD  oxyCODONE-acetaminophen (PERCOCET/ROXICET) 5-325 MG tablet Take 1 tablet by mouth every 6 (six) hours as needed. 10/03/20   Constant, Peggy, MD  XARELTO 20 MG TABS tablet TAKE 1 TABLET BY MOUTH EVERY DAY Patient taking differently: at bedtime. 07/17/20   Leeanne Rio, MD    Allergies    Patient has no known allergies.  Review of Systems   Review of Systems  Genitourinary: Positive for vaginal bleeding.   All other systems reviewed and are negative except that which was mentioned in HPI  Physical Exam Updated Vital Signs BP (!) 146/66   Pulse 70   Temp 98.4 F (36.9 C) (Oral)   Resp 10   SpO2 98%   Physical Exam Vitals and nursing note reviewed. Exam conducted with a chaperone present.  Constitutional:      General: She is not in acute distress.    Appearance: Normal appearance.  HENT:     Head: Normocephalic and atraumatic.  Eyes:     Conjunctiva/sclera: Conjunctivae normal.  Cardiovascular:     Rate and Rhythm: Normal rate and regular rhythm.     Heart sounds: Normal heart sounds. No murmur heard.   Pulmonary:     Effort: Pulmonary effort is normal.     Breath sounds: Normal breath sounds.  Abdominal:     General:  Abdomen is flat. Bowel sounds are normal. There is no distension.     Palpations: Abdomen is soft.     Tenderness: There is no abdominal tenderness.  Genitourinary:    Comments: Large clot w/ BRB in vaginal vault, small clots at cervical os Musculoskeletal:     Right lower leg: No edema.     Left lower leg: No edema.  Skin:    General:  Skin is warm and dry.  Neurological:     Mental Status: She is alert and oriented to person, place, and time.     Comments: fluent  Psychiatric:        Mood and Affect: Mood normal.        Behavior: Behavior normal.     ED Results / Procedures / Treatments   Labs (all labs ordered are listed, but only abnormal results are displayed) Labs Reviewed  BASIC METABOLIC PANEL - Abnormal; Notable for the following components:      Result Value   Potassium 3.3 (*)    CO2 20 (*)    Glucose, Bld 114 (*)    Creatinine, Ser 1.52 (*)    GFR, Estimated 36 (*)    All other components within normal limits  CBC WITH DIFFERENTIAL/PLATELET - Abnormal; Notable for the following components:   RBC 3.69 (*)    Hemoglobin 10.6 (*)    HCT 33.5 (*)    All other components within normal limits  HEMOGLOBIN AND HEMATOCRIT, BLOOD - Abnormal; Notable for the following components:   Hemoglobin 10.8 (*)    HCT 33.6 (*)    All other components within normal limits  SARS CORONAVIRUS 2 (TAT 6-24 HRS)  TYPE AND SCREEN    EKG None  Radiology No results found.  Procedures Procedures   Medications Ordered in ED Medications  tranexamic acid (LYSTEDA) tablet 1,300 mg (has no administration in time range)    ED Course  I have reviewed the triage vital signs and the nursing notes.  Pertinent labs that were available during my care of the patient were reviewed by me and considered in my medical decision making (see chart for details).    MDM Rules/Calculators/A&P                          PT alert, non-toxic on exam w/ stable VS. Large clots on pelvic exam, no  pain. Discussed w/ OBGYN, Dr. Elgie Congo, who recommended talking to hematology about giving oral TXA. He advised there was no surgical intervention that they would offer. Initial Hgb 10.6, repeat several hours later is 10.8. Cr slightly elevated at 1.5, gave fluid bolus after 2nd H/H was drawn. PT had a near-syncopal episode after walking to the bathroom and passing some clots. I spoke w/ Onc, Dr. Clydene Laming, who agreed w/ plan to try oral TXA and advised that she may have to hold xarelto for a few days until bleeding stops and then restart.  Because of ongoing bleeding, near-syncope, and anemia, I feel she needs admission for observation and continued treatment. Discussed briefly w/ [facility], Dr. Hal Hope, who advised that patient should be primarily managed by OBGYN. I reached back out to Dr. Elgie Congo, who will contact pt's surgeon Dr. Elly Modena, this morning. PT signed out pending GYN eval and further recs. Final Clinical Impression(s) / ED Diagnoses Final diagnoses:  None    Rx / DC Orders ED Discharge Orders    None       Jeneane Pieczynski, Wenda Overland, MD 10/09/20 [phone]

## 2020-10-09 NOTE — ED Triage Notes (Signed)
Pt reports a large amount of bright red vaginal bleeding starting about 45 mins ago. States "its coming out like water." She had a D&C procedure on 3/1. States that she is starting to feel weak. Denies pain.

## 2020-10-09 NOTE — ED Notes (Signed)
Patient ambulated to bathroom. On the way back, patient began feeling short of breath/dizzy/lightheaded and sweating. Patient made it back safely in bed. Dr. Rex Kras at bedside and gave verbal order for EKG and 1022mL Normal Saline bolus.

## 2020-10-10 DIAGNOSIS — N950 Postmenopausal bleeding: Secondary | ICD-10-CM | POA: Diagnosis not present

## 2020-10-10 DIAGNOSIS — D62 Acute posthemorrhagic anemia: Secondary | ICD-10-CM

## 2020-10-10 DIAGNOSIS — D6832 Hemorrhagic disorder due to extrinsic circulating anticoagulants: Secondary | ICD-10-CM | POA: Diagnosis not present

## 2020-10-10 DIAGNOSIS — R9389 Abnormal findings on diagnostic imaging of other specified body structures: Secondary | ICD-10-CM | POA: Diagnosis not present

## 2020-10-10 HISTORY — DX: Acute posthemorrhagic anemia: D62

## 2020-10-10 LAB — BASIC METABOLIC PANEL
Anion gap: 6 (ref 5–15)
BUN: 15 mg/dL (ref 8–23)
CO2: 25 mmol/L (ref 22–32)
Calcium: 8.9 mg/dL (ref 8.9–10.3)
Chloride: 109 mmol/L (ref 98–111)
Creatinine, Ser: 1.18 mg/dL — ABNORMAL HIGH (ref 0.44–1.00)
GFR, Estimated: 48 mL/min — ABNORMAL LOW (ref >60)
Glucose, Bld: 96 mg/dL (ref 70–99)
Potassium: 4 mmol/L (ref 3.5–5.1)
Sodium: 140 mmol/L (ref 135–145)

## 2020-10-10 LAB — CBC
HCT: 27.7 % — ABNORMAL LOW (ref 36.0–46.0)
Hemoglobin: 8.9 g/dL — ABNORMAL LOW (ref 12.0–15.0)
MCH: 28.9 pg (ref 26.0–34.0)
MCHC: 32.1 g/dL (ref 30.0–36.0)
MCV: 89.9 fL (ref 80.0–100.0)
Platelets: 249 10*3/uL (ref 150–400)
RBC: 3.08 MIL/uL — ABNORMAL LOW (ref 3.87–5.11)
RDW: 15.5 % (ref 11.5–15.5)
WBC: 7.6 10*3/uL (ref 4.0–10.5)
nRBC: 0 % (ref 0.0–0.2)

## 2020-10-10 MED ORDER — MEGESTROL ACETATE 40 MG PO TABS: ORAL_TABLET | ORAL | 0 refills | Status: AC

## 2020-10-10 MED ORDER — FERROUS SULFATE 325 (65 FE) MG PO TABS: 325.0000 mg | ORAL_TABLET | ORAL | 3 refills | Status: DC

## 2020-10-10 MED ORDER — FERROUS SULFATE 325 (65 FE) MG PO TABS
325.0000 mg | ORAL_TABLET | ORAL | Status: DC
  Administered 2020-10-10: 325 mg via ORAL
  Filled 2020-10-10: qty 1

## 2020-10-10 MED ORDER — TRANEXAMIC ACID 650 MG PO TABS: 1300.0000 mg | ORAL_TABLET | Freq: Three times a day (TID) | ORAL | 0 refills | Status: AC

## 2020-10-10 NOTE — Discharge Summary (Signed)
Physician Discharge Summary  Patient ID: [name] MRN: [id number] DOB/AGE: Feb 14, 1946 75 y.o.  Admit date: 10/09/2020 Discharge date: 10/10/2020  Admission and Discharge Diagnoses:  Principal Problem:   Postmenopausal bleeding Active Problems:   VTE (venous thromboembolism)   Thickened endometrium   Anticoagulant-induced bleeding (HCC)   Anemia due to acute blood loss   Discharged Condition: stable  Hospital Course: Patient was admitted for observation after episode of heavy vaginal bleeding. She is s/p Hysteroscopy, D&C on 10/03/2020, pathology showed endometrial polyp. Patient is on chronic anticoagulation with Xarelto, this contributed to her bleeding. She was treated with TXA after clearance by Hematology and also with Megace. Her hemoglobin dropped from 13.6 to 10.6, and was stable around 9 after serial draws.  Bleeding subsided on 10/10/2020. Patient was able to ambulate and had no presyncopal symptoms, no transfusion was done. She was started on iron therapy, deemed stable for discharge with outpatient follow up. She will complete course of TXA and Megace taper as prescribed.    Significant Diagnostic Studies:  CBC Latest Ref Rng & Units 10/10/2020 10/09/2020 10/09/2020  WBC 4.0 - 10.5 K/uL 7.6 8.6 -  Hemoglobin 12.0 - 15.0 g/dL 8.9(L) 9.4(L) 10.8(L)  Hematocrit 36.0 - 46.0 % 27.7(L) 28.6(L) 33.6(L)  Platelets 150 - 400 K/uL 249 272 -   BMP Latest Ref Rng & Units 10/10/2020 10/09/2020 05/02/2020  Glucose 70 - 99 mg/dL 96 114(H) 94  BUN 8 - 23 mg/dL 15 20 12   Creatinine 0.44 - 1.00 mg/dL 1.18(H) 1.52(H) 0.97  BUN/Creat Ratio 12 - 28 - - 12  Sodium 135 - 145 mmol/L 140 140 144  Potassium 3.5 - 5.1 mmol/L 4.0 3.3(L) 4.2  Chloride 98 - 111 mmol/L 109 107 103  CO2 22 - 32 mmol/L 25 20(L) 22  Calcium 8.9 - 10.3 mg/dL 8.9 9.1 9.8    Discharge Exam: Blood pressure 132/64, pulse 64, temperature 97.9 F (36.6 C), temperature source Oral, resp. rate 16, height 5\' 3"  (1.6 m), weight 87.6  kg, SpO2 100 %. CONSTITUTIONAL: Well-developed, well-nourished female in no acute distress.  SKIN: Skin is warm and dry. No rash noted. Not diaphoretic. No erythema. No pallor. NEUROLOGIC: Alert and oriented to person, place, and time. Normal reflexes, muscle tone coordination. No cranial nerve deficit noted. PSYCHIATRIC: Normal mood and affect. Normal behavior. Normal judgment and thought content. CARDIOVASCULAR: Normal heart rate noted, regular rhythm RESPIRATORY: Effort and breath sounds normal, no problems with respiration noted ABDOMEN: Soft, nontender, nondistended. PELVIC: Scant blood on pad reported MUSCULOSKELETAL: Normal range of motion. No tenderness.  No cyanosis, clubbing, or edema.  2+ distal pulses.  Disposition: Discharge disposition: 01-Home or Self Care         Future Appointments  Date Time Provider [place]  10/11/2020  1:00 PM [facility] PREVISIT RM 51 [facility]  10/17/2020 11:30 AM [facility] PROCEDURES 1 [facility]  10/19/2020 11:00 AM Constant, Peggy, MD [facility] None  10/23/2020 11:00 AM [place], MD [facility]  11/07/2020 10:50 AM Leeanne Rio, MD [facility] [place]    Allergies as of 10/10/2020   No Known Allergies     Medication List    TAKE these medications   amLODipine 5 MG tablet Commonly known as: NORVASC Take 1 tablet (5 mg total) by mouth at bedtime.   ferrous sulfate 325 (65 FE) MG tablet Take 1 tablet (325 mg total) by mouth every other day.   ibuprofen 600 MG tablet Commonly known as: ADVIL Take 1 tablet (600  mg total) by mouth every 6 (six) hours as needed. What changed: reasons to take this   megestrol 40 MG tablet Commonly known as: MEGACE Take 2 tablets (80 mg total) by mouth 2 (two) times daily for 5 days, THEN 2 tablets (80 mg total) daily for 5 days, THEN 1 tablet (40 mg total) daily for 5 days. Start taking on: October 10, 2020   oxyCODONE-acetaminophen 5-325 MG tablet Commonly known as:  PERCOCET/ROXICET Take 1 tablet by mouth every 6 (six) hours as needed. What changed: reasons to take this   tranexamic acid 650 MG Tabs tablet Commonly known as: LYSTEDA Take 2 tablets (1,300 mg total) by mouth 3 (three) times daily for 4 days.   Vitamin C 500 MG Caps Take 500 mg by mouth daily.   Xarelto 20 MG Tabs tablet Generic drug: rivaroxaban TAKE 1 TABLET BY MOUTH EVERY DAY What changed: how much to take       Discharge time: 20 minutes  Signed: Verita Schneiders, MD 10/10/2020, 9:34 AM

## 2020-10-10 NOTE — Discharge Instructions (Signed)
Postmenopausal Bleeding Postmenopausal bleeding is any bleeding that a woman has after she has entered menopause. Menopause is the end of a woman's fertile years. After menopause, a woman no longer ovulates and does not have menstrual periods. Therefore, she should no longer have bleeding from her vagina. Postmenopausal bleeding may have various causes, including:  Menopausal hormone therapy (MHT).  Endometrial atrophy. After menopause, low estrogen hormone levels cause the membrane that lines the uterus (endometrium) to become thin. You may have bleeding as the endometrium thins.  Endometrial hyperplasia. This condition is caused by excess estrogen hormones and low levels of progesterone hormones. The excess estrogen causes the endometrium to thicken, which can lead to bleeding. In some cases, this can lead to cancer of the uterus.  Endometrial cancer.  Noncancerous growths (polyps) on the endometrium, the lining of the uterus, or the cervix.  Uterine fibroids. These are noncancerous growths in or around the uterus muscle tissue that can cause heavy bleeding. Any type of postmenopausal bleeding, even if it appears to be a typical menstrual period, should be checked by your health care provider. Treatment will depend on the cause of the bleeding. Follow these instructions at home:  Pay attention to any changes in your symptoms. Let your health care provider know about them.  Avoid using tampons and douches as told by your health care provider.  Change your pads regularly.  Get regular pelvic exams, including Pap tests, as told by your health care provider.  Take iron supplements as told by your health care provider.  Take over-the-counter and prescription medicines only as told by your health care provider.  Keep all follow-up visits. This is important.   Contact a health care provider if:  You have new bleeding from the vagina after menopause.  You have pain in your abdomen. Get  help right away if:  You have a fever or chills.  You have severe pain with bleeding.  You are passing blood clots.  You have heavy bleeding, need more than 1 pad an hour, and have never experienced this before.  You have headaches or feel faint or dizzy. Summary  Postmenopausal bleeding is any bleeding that a woman has after she has entered into menopause.  Postmenopausal bleeding may have various causes. Treatment will depend on the cause of the bleeding.  Any type of postmenopausal bleeding, even if it appears to be a typical menstrual period, should be checked by your health care provider.  Be sure to pay attention to any changes in your symptoms and keep all follow-up visits. This information is not intended to replace advice given to you by your health care provider. Make sure you discuss any questions you have with your health care provider. Document Revised: 01/06/2020 Document Reviewed: 01/06/2020 Elsevier Patient Education  2021 Elsevier Inc.  

## 2020-10-17 ENCOUNTER — Ambulatory Visit
Admit: 2020-10-17 | Discharge: 2020-10-17 | Disposition: A | Discharge status: Home / Self Care | Payer: Medicare HMO | Attending: Family Medicine | Admitting: Family Medicine

## 2020-10-17 ENCOUNTER — Other Ambulatory Visit: Payer: Self-pay | Admitting: Obstetrics & Gynecology

## 2020-10-17 ENCOUNTER — Ambulatory Visit
Admission: RE | Admit: 2020-10-17 | Discharge: 2020-10-17 | Disposition: A | Discharge status: Home / Self Care | Payer: Medicare HMO | Source: Ambulatory Visit | Attending: Family Medicine | Admitting: Family Medicine

## 2020-10-17 ENCOUNTER — Other Ambulatory Visit: Payer: Self-pay

## 2020-10-17 DIAGNOSIS — R921 Mammographic calcification found on diagnostic imaging of breast: Secondary | ICD-10-CM

## 2020-10-17 DIAGNOSIS — D241 Benign neoplasm of right breast: Secondary | ICD-10-CM | POA: Diagnosis not present

## 2020-10-17 HISTORY — PX: BREAST BIOPSY: SHX20

## 2020-10-19 ENCOUNTER — Ambulatory Visit (INDEPENDENT_AMBULATORY_CARE_PROVIDER_SITE_OTHER): Payer: Medicare HMO | Admitting: Obstetrics and Gynecology

## 2020-10-19 ENCOUNTER — Other Ambulatory Visit: Payer: Self-pay

## 2020-10-19 ENCOUNTER — Encounter: Payer: Self-pay | Admitting: Obstetrics and Gynecology

## 2020-10-19 VITALS — BP 162/71 | HR 89 | Wt 190.0 lb

## 2020-10-19 DIAGNOSIS — Z9889 Other specified postprocedural states: Secondary | ICD-10-CM

## 2020-10-19 NOTE — Progress Notes (Signed)
75 yo postmenopausal here for post op check. Patient underwent a dilatation and curettage on 3/1 due to a thickened endometrium. Patient with post op course complicated by overnight observation following a near syncopal episode and heavy vaginal bleeding associated with restarting xarelto. Patient reports doing well. She is completing the megace taper and completed course of TXA. She denies any pelvic pain, abnormal discharge or vaginal bleeding  Past Medical History:  Diagnosis Date  . Ambulates with cane   . Anemia    hx of yrs ago  . DJD (degenerative joint disease) of hip   . GERD (gastroesophageal reflux disease)    h/o in the 1990s  . Heart murmur    mild no cardiologist  . Hyperlipidemia   . Hypertension    sees Dr. Gilda Crease  . Memory impairment    gets confused about medications history etc speak with son lynwood Abril cell [phone]  . Obesity (BMI 30-39.9)   . Pneumonia 10/2018  . 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 Recurrent pulmonary embolism [facility])    see pulmonologist Dr. Asencion Noble  . Sleep apnea    sleep study 2017 no cpap given  . Wears glasses for reading  . Wears partial dentures upper   Past Surgical History:  Procedure Laterality Date  . CARPAL TUNNEL RELEASE  09/02/2012   Procedure: CARPAL TUNNEL RELEASE;  Surgeon: Winfield Cunas, MD;  Location: [facility];  Service: Neurosurgery;  Laterality: Right;  RIGHT carpal tunnel release  . DILATION AND CURETTAGE OF UTERUS  02/21/2012   Procedure: DILATATION AND CURETTAGE;  Surgeon: Emily Filbert, MD;  Location: [facility];  Service: Gynecology;  Laterality: N/A;  . Femoral vein ligation  12/2002  . [place] filter  11/03/2003   R Jugular  . HYSTEROSCOPY WITH D & C N/A 10/03/2020   Procedure: DILATATION AND CURETTAGE /HYSTEROSCOPY WITH MYOSURE;  Surgeon: Mora Bellman, MD;  Location: [facility];  Service: Gynecology;  Laterality: N/A;  . SHOULDER SURGERY  Left 07/15/08   Dr Hal Morales  . TOTAL HIP ARTHROPLASTY  R 05/2203, L 2010  . TRANSTHORACIC ECHOCARDIOGRAM     EF 55-60%, RV mod dilated, mildly increased LV wall thickness  . TUBAL LIGATION  1981   Family History  Problem Relation Age of Onset  . Heart disease Mother   . Heart attack Mother   . Heart disease Father   . Heart attack Father   . Cancer Brother   . Cancer Sister   . Alcohol abuse Sister    Social History   Tobacco Use  . Smoking status: Never Smoker  . Smokeless tobacco: Never Used  Vaping Use  . Vaping Use: Never used  Substance Use Topics  . Alcohol use: No  . Drug use: No   ROS See pertinent in HPI. All other systems reviewed and non contributory  Blood pressure (!) 162/71, pulse 89, weight 190 lb (86.2 kg).  GENERAL: Well-developed, well-nourished female in no acute distress.  NEURO: alert and oriented x 3  A/P 75 yo here for post op check s/p hysteroscopic polypectomy - Pathology results reviewed with the patient - Patient is medically cleared to resume all activities of daily living - Patient to follow up with PCP as scheduled  - Patient to follow up with specialist for on going evaluation for unexplained  weight loss

## 2020-10-23 ENCOUNTER — Encounter: Payer: Medicare HMO | Admitting: Gastroenterology

## 2020-10-27 ENCOUNTER — Telehealth: Payer: Self-pay

## 2020-10-27 NOTE — Telephone Encounter (Signed)
Patient's son calls nurse line regarding pain management for patient. Patient has been having right sided leg pain since yesterday morning. Pain starts at foot and goes up leg and into knee. Denies injury, calf pain (redness, swelling, warmth, fever). Patient is rating pain at 4/10. Son is asking if he can give patient ibuprofen. Due to patient history of CKD and last BMP, precepted with Dr. McDiarmid. Per Dr. McDiarmid, patient can take 400 mg ibuprofen one to two times daily, as needed. Advised that patient not take for more than three days. Provided these recommendations to son. Advised that if patient is continuing to have pain to follow up with the clinic on Monday morning.   Son verbalizes understanding. Provided with strict ED precautions for over the weekend.   Talbot Grumbling, RN

## 2020-11-01 ENCOUNTER — Telehealth: Payer: Self-pay

## 2020-11-01 NOTE — Telephone Encounter (Signed)
Dr. Elly Modena do you have any recommendations regarding this patient's bleeding? I am happy to put her back on megace for a longer period of time if you think that is a good idea.  Thanks Leeanne Rio, MD

## 2020-11-01 NOTE — Telephone Encounter (Signed)
Patient calls nurse line reporting she had vaginal polyps removed recently and is still bleeding. Patient reports she has been going through one pad per day. Patient denies any abdominal pain, fever or chills. Patient would only like to see PCP for this. Patient has an apt for 4/5 with PCP already. ED precautions given to patient. Will forward to PCP for any further instruction.

## 2020-11-07 ENCOUNTER — Other Ambulatory Visit: Payer: Self-pay

## 2020-11-07 ENCOUNTER — Encounter: Payer: Self-pay | Admitting: Family Medicine

## 2020-11-07 ENCOUNTER — Ambulatory Visit (INDEPENDENT_AMBULATORY_CARE_PROVIDER_SITE_OTHER): Payer: Medicare HMO | Admitting: Family Medicine

## 2020-11-07 VITALS — BP 138/80 | HR 80 | Ht 64.0 in | Wt 191.8 lb

## 2020-11-07 DIAGNOSIS — N950 Postmenopausal bleeding: Secondary | ICD-10-CM | POA: Diagnosis not present

## 2020-11-07 DIAGNOSIS — R634 Abnormal weight loss: Secondary | ICD-10-CM

## 2020-11-07 DIAGNOSIS — R9389 Abnormal findings on diagnostic imaging of other specified body structures: Secondary | ICD-10-CM

## 2020-11-07 DIAGNOSIS — I10 Essential (primary) hypertension: Secondary | ICD-10-CM | POA: Diagnosis not present

## 2020-11-07 MED ORDER — RIVAROXABAN 20 MG PO TABS: 20.0000 mg | ORAL_TABLET | Freq: Every day | ORAL | 1 refills | Status: DC

## 2020-11-07 NOTE — Progress Notes (Signed)
  Date of Visit: 11/07/2020   SUBJECTIVE:   HPI:  [name] presents today for routine follow up.  Unintentional weight loss - weight loss has stabilized since last visit. She has had several eventful medical encounters since I last saw her.  Underwent hysteroscopy with D&C on 3/1, and was admitted overnight to the hospital on 3/7 due to recurrent vaginal bleeding. This has since slowed significantly and she just has occasional spotting. Is taking her xarelto. Was discharged on iron as well.  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 Has appointment this Friday with Dr. Gershon Crane at [facility] for surgical consultation. Over the last few days has noticed a knot in her right breast in the area of the biopsy. No pain, no nipple drainage. No fevers.  Hypertension - taking amlodipine 5mg  daily, tolerating well.  OBJECTIVE:   BP 138/80   Pulse 80   Ht 5\' 4"  (1.626 m)   Wt 191 lb 12.8 oz (87 kg)   SpO2 100%   BMI 32.92 kg/m  Gen: no acute distress, pleasant, cooperative HEENT: normocephalic, atraumatic  Heart: regular rate and rhythm, no murmur Lungs: clear to auscultation bilaterally, normal work of breathing  Neuro: alert, grossly nonfocal, speech normal Breast: chaperone present [name] CMA). Approximately 3-4cm palpable mass in 12-1oclock position around site of prior biopsy. No erythema or tenderness. No nipple drainage. No axillary lymphadenopathy   ASSESSMENT/PLAN:   Health maintenance:  -interested in COVID booster dose #2; advised can get at pharmacy (our computer system is not set up for it today) -advised to reschedule her colonoscopy/EGD (this was postponed due to her hospitalization)  Unintentional weight loss Weight loss has stabilized fortunately Continues to undergo workup.  - advised to call & reschedule EGD/colonoscopy - has follow up this Friday for  breast lesion with surgeon - follow up with me in 6 weeks sooner if needed  Thickened endometrium Fortunately no signs of cancer on hysteroscopy/D&C. Vaginal bleeding has slowed Check CBC today, if normal can likely stop iron  Hypertension Well controlled. Continue current medication regimen.   Breast mass Suspect inflammatory reaction or possibly hematoma from prior breast biopsy. (notably did not have clinical breast mass prior to biopsy). No signs of current infection or active bleeding in the area. I think she is okay to wait until her scheduled surgical appointment on Friday for further evaluation.  FOLLOW UP: Follow up in 6 weeks with me for above issues  [place] J. Ardelia Mems, [place]

## 2020-11-07 NOTE — Patient Instructions (Signed)
It was great to see you again today!  Checking blood counts today  Call [place] office to reschedule your colonoscopy and upper endoscopy.  That phone number is [phone].  Ok to stop the vitamin C.  Can get second COVID booster at a pharmacy  Keep appointment with breast surgeon later this week  Follow up with me in about 6 weeks, sooner if needed  Be well, Dr. Ardelia Mems

## 2020-11-07 NOTE — Telephone Encounter (Signed)
Spoke with patient about this during her visit today. Just having occasional spotting, does not need medication currently Sharon Rio, MD

## 2020-11-08 ENCOUNTER — Encounter: Payer: Self-pay | Admitting: Family Medicine

## 2020-11-08 LAB — CBC
Hematocrit: 36.3 % (ref 34.0–46.6)
Hemoglobin: 11.6 g/dL (ref 11.1–15.9)
MCH: 29.6 pg (ref 26.6–33.0)
MCHC: 32 g/dL (ref 31.5–35.7)
MCV: 93 fL (ref 79–97)
Platelets: 366 10*3/uL (ref 150–450)
RBC: 3.92 x10E6/uL (ref 3.77–5.28)
RDW: 14.6 % (ref 11.7–15.4)
WBC: 6.9 10*3/uL (ref 3.4–10.8)

## 2020-11-10 ENCOUNTER — Other Ambulatory Visit: Payer: Self-pay | Admitting: Surgery

## 2020-11-10 DIAGNOSIS — D241 Benign neoplasm of right breast: Secondary | ICD-10-CM

## 2020-11-12 NOTE — Assessment & Plan Note (Signed)
Weight loss has stabilized fortunately Continues to undergo workup.  - advised to call & reschedule EGD/colonoscopy - has follow up this Friday for breast lesion with surgeon - follow up with me in 6 weeks sooner if needed

## 2020-11-12 NOTE — Assessment & Plan Note (Signed)
Fortunately no signs of cancer on hysteroscopy/D&C. Vaginal bleeding has slowed Check CBC today, if normal can likely stop iron

## 2020-11-12 NOTE — Assessment & Plan Note (Signed)
Well controlled. Continue current medication regimen.  

## 2020-12-06 ENCOUNTER — Other Ambulatory Visit: Payer: Self-pay | Admitting: Obstetrics & Gynecology

## 2020-12-07 ENCOUNTER — Other Ambulatory Visit: Payer: Self-pay | Admitting: Family Medicine

## 2021-02-01 ENCOUNTER — Ambulatory Visit: Payer: Medicare HMO | Admitting: Family Medicine

## 2021-02-06 ENCOUNTER — Ambulatory Visit (AMBULATORY_SURGERY_CENTER): Payer: Medicare HMO

## 2021-02-06 VITALS — Ht 64.0 in | Wt 181.0 lb

## 2021-02-06 DIAGNOSIS — R634 Abnormal weight loss: Secondary | ICD-10-CM

## 2021-02-06 DIAGNOSIS — Z1211 Encounter for screening for malignant neoplasm of colon: Secondary | ICD-10-CM

## 2021-02-06 NOTE — Progress Notes (Signed)
No egg or soy allergy known to patient  No issues with past sedation with any surgeries or procedures Patient denies ever being told they had issues or difficulty with intubation  No FH of Malignant Hyperthermia No diet pills per patient No home 02 use per patient   Pt is on blood thinners , Pt instructed to hold Xarelto for 2 days prior to procedure. SEE TE for 08/03/20   Pt denies issues with constipation   No A fib or A flutter  EMMI video to pt or via [place] 19 guidelines implemented in [place] today with Pt and RN  Pt is fully vaccinated  for Covid   Virtual visit with pt and son  Miralax prep  Due to the COVID-19 pandemic we are asking patients to follow certain guidelines.  Pt aware of COVID protocols and [facility] guidelines

## 2021-02-20 ENCOUNTER — Encounter: Payer: Medicare HMO | Admitting: Gastroenterology

## 2021-03-06 ENCOUNTER — Ambulatory Visit: Payer: Medicare HMO | Admitting: Family Medicine

## 2021-03-09 ENCOUNTER — Ambulatory Visit: Payer: Medicare HMO | Admitting: Family Medicine

## 2021-03-11 ENCOUNTER — Observation Stay (HOSPITAL_COMMUNITY)
Admission: EM | Admit: 2021-03-11 | Discharge: 2021-03-11 | Disposition: A | Discharge status: Home / Self Care | Payer: Medicare HMO | Attending: Family Medicine | Admitting: Family Medicine

## 2021-03-11 ENCOUNTER — Emergency Department (HOSPITAL_COMMUNITY): Payer: Medicare HMO

## 2021-03-11 ENCOUNTER — Encounter (HOSPITAL_COMMUNITY): Payer: Self-pay

## 2021-03-11 ENCOUNTER — Other Ambulatory Visit: Payer: Self-pay

## 2021-03-11 ENCOUNTER — Observation Stay (HOSPITAL_BASED_OUTPATIENT_CLINIC_OR_DEPARTMENT_OTHER): Payer: Medicare HMO

## 2021-03-11 DIAGNOSIS — I129 Hypertensive chronic kidney disease with stage 1 through stage 4 chronic kidney disease, or unspecified chronic kidney disease: Secondary | ICD-10-CM | POA: Insufficient documentation

## 2021-03-11 DIAGNOSIS — S0990XA Unspecified injury of head, initial encounter: Secondary | ICD-10-CM | POA: Diagnosis not present

## 2021-03-11 DIAGNOSIS — R0789 Other chest pain: Secondary | ICD-10-CM | POA: Diagnosis not present

## 2021-03-11 DIAGNOSIS — R404 Transient alteration of awareness: Secondary | ICD-10-CM | POA: Diagnosis not present

## 2021-03-11 DIAGNOSIS — Z7901 Long term (current) use of anticoagulants: Secondary | ICD-10-CM | POA: Diagnosis not present

## 2021-03-11 DIAGNOSIS — Z743 Need for continuous supervision: Secondary | ICD-10-CM | POA: Diagnosis not present

## 2021-03-11 DIAGNOSIS — Z20822 Contact with and (suspected) exposure to covid-19: Secondary | ICD-10-CM | POA: Insufficient documentation

## 2021-03-11 DIAGNOSIS — Z96641 Presence of right artificial hip joint: Secondary | ICD-10-CM | POA: Insufficient documentation

## 2021-03-11 DIAGNOSIS — Z043 Encounter for examination and observation following other accident: Secondary | ICD-10-CM | POA: Diagnosis not present

## 2021-03-11 DIAGNOSIS — R55 Syncope and collapse: Secondary | ICD-10-CM

## 2021-03-11 DIAGNOSIS — N1831 Chronic kidney disease, stage 3a: Secondary | ICD-10-CM | POA: Diagnosis not present

## 2021-03-11 DIAGNOSIS — I700 Atherosclerosis of aorta: Secondary | ICD-10-CM | POA: Diagnosis not present

## 2021-03-11 DIAGNOSIS — Z79899 Other long term (current) drug therapy: Secondary | ICD-10-CM | POA: Diagnosis not present

## 2021-03-11 DIAGNOSIS — R079 Chest pain, unspecified: Secondary | ICD-10-CM | POA: Diagnosis not present

## 2021-03-11 DIAGNOSIS — M5030 Other cervical disc degeneration, unspecified cervical region: Secondary | ICD-10-CM | POA: Diagnosis not present

## 2021-03-11 DIAGNOSIS — G4489 Other headache syndrome: Secondary | ICD-10-CM | POA: Diagnosis not present

## 2021-03-11 LAB — HEPATIC FUNCTION PANEL
ALT: 9 U/L (ref 0–44)
AST: 18 U/L (ref 15–41)
Albumin: 3.7 g/dL (ref 3.5–5.0)
Alkaline Phosphatase: 51 U/L (ref 38–126)
Bilirubin, Direct: 0.2 mg/dL (ref 0.0–0.2)
Indirect Bilirubin: 0.7 mg/dL (ref 0.3–0.9)
Total Bilirubin: 0.9 mg/dL (ref 0.3–1.2)
Total Protein: 6.2 g/dL — ABNORMAL LOW (ref 6.5–8.1)

## 2021-03-11 LAB — TROPONIN I (HIGH SENSITIVITY)
Troponin I (High Sensitivity): 7 ng/L (ref <18)
Troponin I (High Sensitivity): 28 ng/L — ABNORMAL HIGH (ref <18)
Troponin I (High Sensitivity): 27 ng/L — ABNORMAL HIGH (ref <18)

## 2021-03-11 LAB — ECHOCARDIOGRAM COMPLETE
AV Mean grad: 4 mmHg
AV Peak grad: 7.6 mmHg
Ao pk vel: 1.38 m/s
Area-P 1/2: 3.23 cm2
S' Lateral: 2.2 cm
Weight: 2895.96 oz

## 2021-03-11 LAB — RESP PANEL BY RT-PCR (FLU A&B, COVID) ARPGX2
Influenza A by PCR: NEGATIVE
Influenza B by PCR: NEGATIVE
SARS Coronavirus 2 by RT PCR: NEGATIVE

## 2021-03-11 LAB — URINALYSIS, ROUTINE W REFLEX MICROSCOPIC
Bacteria, UA: NONE SEEN
Bilirubin Urine: NEGATIVE
Glucose, UA: NEGATIVE mg/dL
Hgb urine dipstick: NEGATIVE
Ketones, ur: NEGATIVE mg/dL
Nitrite: NEGATIVE
Protein, ur: NEGATIVE mg/dL
Specific Gravity, Urine: 1.004 — ABNORMAL LOW (ref 1.005–1.030)
pH: 7 (ref 5.0–8.0)

## 2021-03-11 LAB — CBC WITH DIFFERENTIAL/PLATELET
Abs Immature Granulocytes: 0.01 10*3/uL (ref 0.00–0.07)
Basophils Absolute: 0 10*3/uL (ref 0.0–0.1)
Basophils Relative: 0 %
Eosinophils Absolute: 0 10*3/uL (ref 0.0–0.5)
Eosinophils Relative: 1 %
HCT: 35.9 % — ABNORMAL LOW (ref 36.0–46.0)
Hemoglobin: 10.8 g/dL — ABNORMAL LOW (ref 12.0–15.0)
Immature Granulocytes: 0 %
Lymphocytes Relative: 23 %
Lymphs Abs: 1.2 10*3/uL (ref 0.7–4.0)
MCH: 26.9 pg (ref 26.0–34.0)
MCHC: 30.1 g/dL (ref 30.0–36.0)
MCV: 89.3 fL (ref 80.0–100.0)
Monocytes Absolute: 0.3 10*3/uL (ref 0.1–1.0)
Monocytes Relative: 6 %
Neutro Abs: 3.7 10*3/uL (ref 1.7–7.7)
Neutrophils Relative %: 70 %
Platelets: 267 10*3/uL (ref 150–400)
RBC: 4.02 MIL/uL (ref 3.87–5.11)
RDW: 15.8 % — ABNORMAL HIGH (ref 11.5–15.5)
WBC: 5.3 10*3/uL (ref 4.0–10.5)
nRBC: 0 % (ref 0.0–0.2)

## 2021-03-11 LAB — BASIC METABOLIC PANEL
Anion gap: 10 (ref 5–15)
BUN: 14 mg/dL (ref 8–23)
CO2: 22 mmol/L (ref 22–32)
Calcium: 9.5 mg/dL (ref 8.9–10.3)
Chloride: 108 mmol/L (ref 98–111)
Creatinine, Ser: 1.16 mg/dL — ABNORMAL HIGH (ref 0.44–1.00)
GFR, Estimated: 49 mL/min — ABNORMAL LOW (ref >60)
Glucose, Bld: 107 mg/dL — ABNORMAL HIGH (ref 70–99)
Potassium: 3.8 mmol/L (ref 3.5–5.1)
Sodium: 140 mmol/L (ref 135–145)

## 2021-03-11 LAB — LIPID PANEL
Cholesterol: 204 mg/dL — ABNORMAL HIGH (ref 0–200)
HDL: 57 mg/dL (ref >40)
LDL Cholesterol: 135 mg/dL — ABNORMAL HIGH (ref 0–99)
Total CHOL/HDL Ratio: 3.6 RATIO
Triglycerides: 61 mg/dL (ref <150)
VLDL: 12 mg/dL (ref 0–40)

## 2021-03-11 LAB — CBG MONITORING, ED: Glucose-Capillary: 102 mg/dL — ABNORMAL HIGH (ref 70–99)

## 2021-03-11 MED ORDER — RIVAROXABAN 15 MG PO TABS: 15.0000 mg | ORAL_TABLET | Freq: Every day | ORAL | Status: DC

## 2021-03-11 MED ORDER — FERROUS SULFATE 325 (65 FE) MG PO TABS: 325.0000 mg | ORAL_TABLET | ORAL | Status: DC

## 2021-03-11 MED ORDER — SODIUM CHLORIDE 0.9 % IV BOLUS
1000.0000 mL | Freq: Once | INTRAVENOUS | Status: AC
  Administered 2021-03-11: 1000 mL via INTRAVENOUS

## 2021-03-11 MED ORDER — RIVAROXABAN 20 MG PO TABS: 20.0000 mg | ORAL_TABLET | Freq: Every day | ORAL | Status: DC

## 2021-03-11 NOTE — ED Notes (Signed)
Pt's family member ask this RN if he could speak with pt's hospital provider to address any concerns. Did not tell this RN the specifics of his concerns. RN paged Dr. Volanda Napoleon

## 2021-03-11 NOTE — ED Notes (Signed)
After ECHO was done on the patient, the patient changed back into street clothes. Pt informed that we keep patient's in a hospital gown while admitted. Patient requested to stay in street clothes at this time. Patient informed that once she gets a bed on the floor, she will most likely have to change into the hospital gown at that time. Patient acknowledged an understanding of that and prefers to remain in hospital gown until that time comes.

## 2021-03-11 NOTE — ED Provider Notes (Signed)
[facility] EMERGENCY DEPARTMENT Provider Note   CSN: [id number] Arrival date & time: 03/11/21  1120     History Chief Complaint  Patient presents with   Loss of Consciousness    [name] is a 75 y.o. female.  Patient states that she remembers sitting down at church and feeling fine this morning.  Next thing she knew she had supposedly passed out.  Per EMS and nursing staff patient was found to be hypotensive with a heart rate in the 140s and 160s.  She was given 200 cc IV fluids.  Blood sugar was normal.  Do not have EMS rhythm strip.  She is on blood thinner for blood clots.  She denies any chest pain or shortness of breath currently.  She denies any prodromal symptoms prior to her passing out.  She is not sure if she hit her head.  The history is provided by the patient.  Loss of Consciousness Episode history:  Single Most recent episode:  Today Context: sitting down   Witnessed: yes   Relieved by:  Bed rest Worsened by:  Nothing Associated symptoms: no anxiety, no chest pain, no difficulty breathing, no fever, no palpitations, no seizures, no shortness of breath, no vomiting and no weakness       Past Medical History:  Diagnosis Date   Ambulates with cane    Anemia    hx of yrs ago   DJD (degenerative joint disease) of hip    GERD (gastroesophageal reflux disease)    h/o in the 1990s   Heart murmur    mild no cardiologist   Hyperlipidemia    Hypertension    sees Dr. 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 Asencion Noble   Sleep apnea    sleep study 2017 no cpap  given   Wears glasses for reading   Wears partial dentures upper    Patient Active Problem List   Diagnosis Date Noted   Anemia due to acute blood loss 10/10/2020   Anticoagulant-induced bleeding ([place]) 10/09/2020   Thickened endometrium 05/31/2020   Unintentional weight loss 05/07/2020   Venous stasis dermatitis 12/21/2014   Healthcare maintenance 08/15/2014   GERD (gastroesophageal reflux disease) 01/13/2014   Dyspnea on exertion 01/10/2014   Pain in joint, ankle and foot 07/16/2013   Osteoarthritis of both knees 04/22/2013   Ganglion cyst 02/14/2013   Arm numbness 01/27/2013   Radiculopathy affecting upper extremity 07/23/2012   Shoulder pain, bilateral 05/05/2012   Back pain 11/15/2011   Numbness of fingers 11/15/2011   Postmenopausal bleeding 09/04/2011   VTE (venous thromboembolism) 08/24/2009   OSTEOARTHRITIS, HIP, LEFT 04/07/2008   CHRONIC KIDNEY DISEASE STAGE III (MODERATE) 03/25/2007   Hyperlipidemia 10/02/2006   OBESITY, NOS 10/02/2006   ANEMIA, OTHER, UNSPECIFIED 10/02/2006   Hypertension 10/02/2006    Past Surgical History:  Procedure Laterality Date   CARPAL TUNNEL RELEASE  09/02/2012   Procedure: CARPAL TUNNEL RELEASE;  Surgeon: Winfield Cunas, MD;  Location: [facility];  Service: Neurosurgery;  Laterality: Right;  RIGHT carpal tunnel release   DILATION AND CURETTAGE  OF UTERUS  02/21/2012   Procedure: DILATATION AND CURETTAGE;  Surgeon: Emily Filbert, MD;  Location: [facility];  Service: Gynecology;  Laterality: N/A;   Femoral vein ligation  12/2002   [place] filter  11/03/2003   R Jugular   HYSTEROSCOPY WITH D & C N/A 10/03/2020   Procedure: DILATATION AND CURETTAGE /HYSTEROSCOPY WITH MYOSURE;  Surgeon: Mora Bellman, MD;  Location: [place];  Service: Gynecology;  Laterality: N/A;   SHOULDER SURGERY  Left 07/15/08   Dr Hal Morales   TOTAL HIP ARTHROPLASTY  R 05/2203, L 2010   TRANSTHORACIC ECHOCARDIOGRAM     EF 55-60%, RV mod dilated, mildly  increased LV wall thickness   TUBAL LIGATION  1981     OB History     Gravida  5   Para  2   Term  2   Preterm      AB  3   Living  2      SAB  3   IAB      Ectopic      Multiple      Live Births              Family History  Problem Relation Age of Onset   Heart disease Mother    Heart attack Mother    Heart disease Father    Heart attack Father    Cancer Sister    Alcohol abuse Sister    Cancer Brother    Colon cancer Neg Hx    Colon polyps Neg Hx    Esophageal cancer Neg Hx    Rectal cancer Neg Hx    Stomach cancer Neg Hx     Social History   Tobacco Use   Smoking status: Never    Passive exposure: Never   Smokeless tobacco: Never  Vaping Use   Vaping Use: Never used  Substance Use Topics   Alcohol use: No   Drug use: No    Home Medications Prior to Admission medications   Medication Sig Start Date End Date Taking? Authorizing Provider  amLODipine (NORVASC) 5 MG tablet TAKE 1 TABLET BY MOUTH EVERYDAY AT BEDTIME 12/08/20   Leeanne Rio, MD  ferrous sulfate 325 (65 FE) MG tablet Take 1 tablet (325 mg total) by mouth every other day. 10/10/20   Anyanwu, Sallyanne Havers, MD  rivaroxaban (XARELTO) 20 MG TABS tablet Take 1 tablet (20 mg total) by mouth daily. 11/07/20   Leeanne Rio, MD    Allergies    Patient has no known allergies.  Review of Systems   Review of Systems  Constitutional:  Negative for chills and fever.  HENT:  Negative for ear pain and sore throat.   Eyes:  Negative for pain and visual disturbance.  Respiratory:  Negative for cough and shortness of breath.   Cardiovascular:  Positive for syncope. Negative for chest pain and palpitations.  Gastrointestinal:  Negative for abdominal pain and vomiting.  Genitourinary:  Negative for dysuria and hematuria.  Musculoskeletal:  Negative for arthralgias and back pain.  Skin:  Negative for color change and rash.  Neurological:  Positive for syncope. Negative for seizures and  weakness.  All other systems reviewed and are negative.  Physical Exam Updated Vital Signs BP 118/68   Pulse 85   Temp (!) 97.4 F (36.3 C) (Oral)   Resp 15   SpO2 99%   Physical Exam Vitals and nursing note reviewed.  Constitutional:      General:  She is not in acute distress.    Appearance: She is well-developed. She is not ill-appearing.  HENT:     Head: Normocephalic and atraumatic.     Mouth/Throat:     Mouth: Mucous membranes are moist.  Eyes:     Conjunctiva/sclera: Conjunctivae normal.  Cardiovascular:     Rate and Rhythm: Normal rate and regular rhythm.     Heart sounds: No murmur heard. Pulmonary:     Effort: Pulmonary effort is normal. No respiratory distress.     Breath sounds: Normal breath sounds.  Abdominal:     Palpations: Abdomen is soft.     Tenderness: There is no abdominal tenderness.  Musculoskeletal:        General: No tenderness. Normal range of motion.     Cervical back: Normal range of motion and neck supple. No tenderness.  Skin:    General: Skin is warm and dry.     Capillary Refill: Capillary refill takes less than 2 seconds.  Neurological:     General: No focal deficit present.     Mental Status: She is alert and oriented to person, place, and time.     Cranial Nerves: No cranial nerve deficit.     Sensory: No sensory deficit.     Motor: No weakness.     Coordination: Coordination normal.     Comments: 5+ out of 5 strength throughout, normal sensation, no drift, normal finger-nose-finger, normal speech    ED Results / Procedures / Treatments   Labs (all labs ordered are listed, but only abnormal results are displayed) Labs Reviewed  CBC WITH DIFFERENTIAL/PLATELET - Abnormal; Notable for the following components:      Result Value   Hemoglobin 10.8 (*)    HCT 35.9 (*)    RDW 15.8 (*)    All other components within normal limits  BASIC METABOLIC PANEL - Abnormal; Notable for the following components:   Glucose, Bld 107 (*)     Creatinine, Ser 1.16 (*)    GFR, Estimated 49 (*)    All other components within normal limits  HEPATIC FUNCTION PANEL - Abnormal; Notable for the following components:   Total Protein 6.2 (*)    All other components within normal limits  CBG MONITORING, ED - Abnormal; Notable for the following components:   Glucose-Capillary 102 (*)    All other components within normal limits  RESP PANEL BY RT-PCR (FLU A&B, COVID) ARPGX2  URINALYSIS, ROUTINE W REFLEX MICROSCOPIC  TROPONIN I (HIGH SENSITIVITY)    EKG EKG Interpretation  Date/Time:  Sunday March 11 2021 11:27:26 EDT Ventricular Rate:  83 PR Interval:  171 QRS Duration: 104 QT Interval:  403 QTC Calculation: 474 R Axis:   -51 Text Interpretation: Sinus rhythm RSR' in V1 or V2, right VCD or RVH Inferior infarct, old Borderline ST elevation, anterior leads Confirmed by Lennice Sites [phone]) on 03/11/2021 11:40:32 AM  Radiology CT HEAD WO CONTRAST (5MM)  Result Date: 03/11/2021 CLINICAL DATA:  Fall EXAM: CT HEAD WITHOUT CONTRAST CT CERVICAL SPINE WITHOUT CONTRAST TECHNIQUE: Multidetector CT imaging of the head and cervical spine was performed following the standard protocol without intravenous contrast. Multiplanar CT image reconstructions of the cervical spine were also generated. COMPARISON:  None. FINDINGS: CT HEAD FINDINGS Brain: No evidence of acute infarction, hemorrhage, hydrocephalus, extra-axial collection or mass lesion/mass effect. Vascular: No hyperdense vessel or unexpected calcification. Skull: Normal. Negative for fracture or focal lesion. Sinuses/Orbits: No acute finding. Total opacification of the left sphenoid sinus, with  bony sinus wall thickening. Other: None. CT CERVICAL SPINE FINDINGS Alignment: Degenerative straightening and reversal of the normal cervical lordosis. Skull base and vertebrae: No acute fracture. No primary bone lesion or focal pathologic process. Soft tissues and spinal canal: No prevertebral fluid or  swelling. No visible canal hematoma. Disc levels: Moderate to severe multilevel disc space height loss and osteophytosis, worst from C5 through C7. Upper chest: Negative. Other: None. IMPRESSION: 1. No acute intracranial pathology. 2. Total opacification of the left sphenoid sinus, with bony sinus wall thickening, consistent with chronic sinusitis. 3. No fracture or static subluxation of the cervical spine. 4. 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 Electronically Signed   By: Eddie Candle M.D.   On: 03/11/2021 12:58   CT Cervical Spine Wo Contrast  Result Date: 03/11/2021 CLINICAL DATA:  Fall EXAM: CT HEAD WITHOUT CONTRAST CT CERVICAL SPINE WITHOUT CONTRAST TECHNIQUE: Multidetector CT imaging of the head and cervical spine was performed following the standard protocol without intravenous contrast. Multiplanar CT image reconstructions of the cervical spine were also generated. COMPARISON:  None. FINDINGS: CT HEAD FINDINGS Brain: No evidence of acute infarction, hemorrhage, hydrocephalus, extra-axial collection or mass lesion/mass effect. Vascular: No hyperdense vessel or unexpected calcification. Skull: Normal. Negative for fracture or focal lesion. Sinuses/Orbits: No acute finding. Total opacification of the left sphenoid sinus, with bony sinus wall thickening. Other: None. CT CERVICAL SPINE FINDINGS Alignment: Degenerative straightening and reversal of the normal cervical lordosis. Skull base and vertebrae: No acute fracture. No primary bone lesion or focal pathologic process. Soft tissues and spinal canal: No prevertebral fluid or swelling. No visible canal hematoma. Disc levels: Moderate to severe multilevel disc space height loss and osteophytosis, worst from C5 through C7. Upper chest: Negative. Other: None. IMPRESSION: 1. No acute intracranial pathology. 2. Total opacification of the left sphenoid sinus, with bony sinus wall thickening, consistent with chronic  sinusitis. 3. No fracture or static subluxation of the cervical spine. 4. 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 Electronically Signed   By: Eddie Candle M.D.   On: 03/11/2021 12:58   DG Chest Portable 1 View  Result Date: 03/11/2021 CLINICAL DATA:  75 year old female with history of syncope. EXAM: PORTABLE CHEST 1 VIEW COMPARISON:  Chest x-ray 11/01/2018. FINDINGS: Lung volumes are slightly low with elevation of the left hemidiaphragm. No consolidative airspace disease. No pleural effusions. No pneumothorax. No pulmonary nodule or mass noted. Pulmonary vasculature and the cardiomediastinal silhouette are within normal limits. Atherosclerosis in the thoracic aorta. IMPRESSION: 1.  No radiographic evidence of acute cardiopulmonary disease. 2. Aortic atherosclerosis. Electronically Signed   By: Vinnie Langton M.D.   On: 03/11/2021 12:13    Procedures Procedures   Medications Ordered in ED Medications  sodium chloride 0.9 % bolus 1,000 mL (1,000 mLs Intravenous New Bag/Given 03/11/21 1202)    ED Course  I have reviewed the triage vital signs and the nursing notes.  Pertinent labs & imaging results that were available during my care of the patient were reviewed by me and considered in my medical decision making (see chart for details).    MDM Rules/Calculators/A&P                           [name] is here after syncopal event while at church.  Vital signs overall unremarkable.  Supposedly was tachycardic with EMS but she arrives with sinus rhythm in the 80s  here.  She got 200 L of fluid.  Blood sugar is normal.  Patient was sitting at church and the next thing she knew she had supposedly passed out.  She does not describe any prodromal symptoms.  Not having any chest pain or shortness of breath.  Neurologically intact.  She is on blood thinners.  Will check basic labs including troponin, head CT, neck CT.  We will pursue infectious work-up.  Overall  concerning syncope story.  Could be dehydration versus electrolyte abnormality versus arrhythmia.  Anticipate admission for telemetry and echocardiogram if work-up is unremarkable otherwise.  No significant anemia, electrolyte abnormality, kidney injury.  Troponin normal.  CT scan of head and neck unremarkable.  Chest x-ray without evidence of infection.  Overall will admit for further syncope work-up.  This chart was dictated using voice recognition software.  Despite best efforts to proofread,  errors can occur which can change the documentation meaning.   Final Clinical Impression(s) / ED Diagnoses Final diagnoses:  Syncope and collapse    Rx / DC Orders ED Discharge Orders     None        Lennice Sites, DO 03/11/21 1309

## 2021-03-11 NOTE — ED Notes (Signed)
Patient transported to CT 

## 2021-03-11 NOTE — ED Notes (Signed)
RN spoke with Dr. Volanda Napoleon on the phone and was told that both she and the intern provider will be downstairs to discuss and address visitor's concerns at their soonest availability.

## 2021-03-11 NOTE — ED Notes (Signed)
Went in to update pt abut bed assignment. Son at bedside, pt and son requesting to speak with provider in regards to going home. Provider paged, charge RN-Callie, notified

## 2021-03-11 NOTE — Hospital Course (Addendum)
[name] is a 75 year old with a history of HTN, HLD, PE, DVT who was admitted to the family practice teaching Service at [facility] for isolated syncopal episode. Her hospital course is detailed below:  Syncope [name] presented to the ED after single nontraumatic syncopal episode in church.  Patient is notably had a similar episode 2 and half years ago when she was without AC for prolonged period of time.  Patient has been without AC for last 2 and half weeks as noted by her son.  She has also been drinking minimal amount of water.  She presented to the ED hypotensive but aware and able to interact with her surroundings.  She was given fluid bolus and pressures returned to normotensive.  She was afebrile, normoglycemic, anemic at 10.6.  EKG noted sinus rhythm, J point elevation with nonspecific changes from previous EKG.  She did not present with any other symptomatology.   PCP follow up recommendations Discuss statin for LDL level

## 2021-03-11 NOTE — ED Triage Notes (Signed)
Pt BIB EMS from church for AFIB RVR. Hx of afib. Pt was feeling weak and became pale/diaphoretic and unresponsive. No falls/injuries. Pt was hypotensive with a HR of 140-160 with EMS. 200 mL IVF given in route.Pt lethargic and weak upon arrival with HR in the 70s. BGL 168. Also c/o HA.

## 2021-03-11 NOTE — H&P (Addendum)
[facility] Admission History and Physical Service Pager: [phone]  Patient name: [name] Medical record number: [id number] Date of birth: 1945/12/02 Age: 75 y.o. Gender: female  Primary Care Provider: Leeanne Rio, MD Consultants: Cardiology Code Status: FULL Preferred Emergency Contact: Marene Lenz [phone]  Chief Complaint: Syncopal episode  Assessment and Plan: [name] is a 75 y.o. female presenting with single syncopal episode. PMH is significant for HTN, HLD, PE x3, DVT.  Syncope Patient presents with isolated syncopal episode today while at church. Previous syncopal episode 2 and half years prior. Vitals on arrival unremarkable. S/p 200 mL bolus by EMS and 1L bolus in ED. Glucose 107, electrolytes WNL, Hgb 10.8, Troponin 7>28. Discussed EKG with Cardiology fellow, Dr. Rudi Rummage, states non-specific J-point elevations and new q-wave in inferior leads that were not apparent in March. Patient denies chest pain, SOB. Felt diaphoretic and nauseous prior to event. Of note, she has not had air conditioning (A/C) working for the past couple weeks. Her A/C was also not working during last syncopal event two years ago. Given similar presentation as previous event suspect hypovolemia/dehydration as cause though must consider arrhythmia, MI, PE, anemia, hypotension, orthostasis. Wells score for PE is 1.5 given history of PE. Wells score for DVT -2. She is on anti-coagulation for history of PE and reports compliance. She is borderline anemic at hemoglobin 10.8 but has had several lower hemoglobins in the past checks and is on iron supplementation. Does endorse decreased PO and unintentional weight loss since last July, for which she has seen her PCP. No recent illness or sick symptoms. No vaginal bleeding. Had a D&C this past March. Will admit for cardiac monitoring and further workup with Echo. -Admit to FPTS med-tele, attending Dr.  Gwendlyn Deutscher -Vitals per floor routine -Orthostatic vitals -Echo ordered -Continuous telemetry monitoring -PT eval and treat -OT eval and treat -Continue Xarelto for VTE ppx  Hypertension Patient's blood pressures have been ranging from 105-134/65-71.  Possible that her blood pressure is too low given her age which may have attributed to syncopal event. -Hold amlodipine 5 mg -Orthostatic vitals   History PE on Life-ling Anticoagulation, s/p IVC filter Reports history of 3 prior PE's. Per chart review, appears patient has had 4 total with first episode being in 1997. Had recurrence in 2005 when coumadin dose was adjusted and INR fell. Hypercoagulable workup at that time was negative. She then had a retroperitoneal hemorrhage with IVC filter placement after recurrent PE/DVT. Reports compliant with Xarelto medication and last took this morning. -Continue Xarelto 20 mg daily starting tomorrow, 8/8  Unintentional weight loss Patient was notably being worked up for intentional weight loss with PCP.  She was to receive EGD/colonoscopy which got delayed and has not been done yet.  She does not have much of an appetite and eats because she knows she is supposed to. -Due for outpatient screening colonoscopy  Hyperlipidemia Lipid panel notable for total cholesterol 204, LDL 135. Not on a statin. ASVCD risk is 12.7%. -Will discuss with patient regarding starting moderate intensity statin for primary prevention (Atorvastatin 20 mg daily)  CKD Stage IIIa Cr 1.16 (baseline ~1), GFR 49. S/p 1200 mL IVF. -Monitor -Encourage oral hydration   FEN/GI: Heart healthy Prophylaxis: Rivaroxaban (Xarelto)  Disposition: Med-tele  History of Present Illness:  [name] is a 75 y.o. female presenting with single syncopal episode.  Past medical history significant for hypertension, hyperlipidemia, PE, DVT.   Patient woke up feeling fine and  went discharged with her family.  On chart she broke out in a sweat,  stated she felt dizzy, and fainted.  She did not fall over or have any trauma to her head just dropped in the chair.  She did not have any shaking.  Son states this happened 2 years prior when her [facility] went out and she was dehydrated.  +2 and half weeks she has not had AC has only been drinking about a cup of water.  After she passed out, son placed her flat on the church pew.  No fever, SOB, chest pain, abdominal pain, no urinary/stool sx, no hematuria or bloody stools. No congestion, cough.  No vaginal bleeding.  She states she has not been eating or drinking normally. Last July wore a size 22, now she is a size 16. "The senses that tell you you're hungry, I don't really feel that anymore". "I eat because I know I am supposed to".  She was supposed to see PCP last week but appointment was cancelled. Now has appointment on September 1st. Was scheduled to do a colonoscopy but son cancelled due to diarrhea. No longer having diarrhea.   Hx of PE x3. States she has not missed any doses of her Xarelto. Also notes compliance with Amlodipine.  When presented to the ED she was noted to be tachycardic in the 140s to 160s and hypotensive.  She received 200 cc IV fluids by EMS and s/p 1L bolus in ED.  Her blood sugar was normal.  History provided by patient and son in the room.  Review Of Systems: Per HPI.  Patient Active Problem List   Diagnosis Date Noted   Anemia due to acute blood loss 10/10/2020   Anticoagulant-induced bleeding ([place]) 10/09/2020   Thickened endometrium 05/31/2020   Unintentional weight loss 05/07/2020   Venous stasis dermatitis 12/21/2014   Healthcare maintenance 08/15/2014   GERD (gastroesophageal reflux disease) 01/13/2014   Dyspnea on exertion 01/10/2014   Pain in joint, ankle and foot 07/16/2013   Osteoarthritis of both knees 04/22/2013   Ganglion cyst 02/14/2013   Arm numbness 01/27/2013   Radiculopathy affecting upper extremity 07/23/2012   Shoulder pain, bilateral  05/05/2012   Back pain 11/15/2011   Numbness of fingers 11/15/2011   Postmenopausal bleeding 09/04/2011   VTE (venous thromboembolism) 08/24/2009   OSTEOARTHRITIS, HIP, LEFT 04/07/2008   CHRONIC KIDNEY DISEASE STAGE III (MODERATE) 03/25/2007   Hyperlipidemia 10/02/2006   OBESITY, NOS 10/02/2006   ANEMIA, OTHER, UNSPECIFIED 10/02/2006   Hypertension 10/02/2006    Past Medical History: Past Medical History:  Diagnosis Date   Ambulates with cane    Anemia    hx of yrs ago   DJD (degenerative joint disease) of hip    GERD (gastroesophageal reflux disease)    h/o in the 1990s   Heart murmur    mild no cardiologist   Hyperlipidemia    Hypertension    sees Dr. 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  had retroperiotneal hemorrahge with IVC filter placement after recurrent PE/DVT   Recurrent pulmonary embolism [facility])    see pulmonologist Dr. Asencion Noble   Sleep apnea    sleep study 2017 no cpap given   Wears glasses for reading  Wears partial dentures upper    Past Surgical History: Past Surgical History:  Procedure Laterality Date   CARPAL TUNNEL RELEASE  09/02/2012   Procedure: CARPAL TUNNEL RELEASE;  Surgeon: Winfield Cunas, MD;  Location: [place] [facility];  Service: Neurosurgery;  Laterality: Right;  RIGHT carpal tunnel release   DILATION AND CURETTAGE OF UTERUS  02/21/2012   Procedure: DILATATION AND CURETTAGE;  Surgeon: Emily Filbert, MD;  Location: [place] [facility];  Service: Gynecology;  Laterality: N/A;   Femoral vein ligation  12/2002   [place] filter  11/03/2003   R Jugular   HYSTEROSCOPY WITH D & C N/A 10/03/2020   Procedure: DILATATION AND CURETTAGE /HYSTEROSCOPY WITH MYOSURE;  Surgeon: Mora Bellman, MD;  Location: [facility];  Service: Gynecology;  Laterality: N/A;   SHOULDER SURGERY  Left 07/15/08   Dr Hal Morales   TOTAL HIP ARTHROPLASTY  R 05/2203, L 2010   TRANSTHORACIC ECHOCARDIOGRAM     EF 55-60%, RV mod dilated, mildly increased LV wall thickness   TUBAL LIGATION  1981    Social History: Social History   Tobacco Use   Smoking status: Never    Passive exposure: Never   Smokeless tobacco: Never  Vaping Use   Vaping Use: Never used  Substance Use Topics   Alcohol use: No   Drug use: No   Please also refer to relevant sections of EMR.  Family History: Family History  Problem Relation Age of Onset   Heart disease Mother    Heart attack Mother    Heart disease Father    Heart attack Father    Cancer Sister    Alcohol abuse Sister    Cancer Brother    Colon cancer Neg Hx    Colon polyps Neg Hx    Esophageal cancer Neg Hx    Rectal cancer Neg Hx    Stomach cancer Neg Hx     Allergies and Medications: No Known Allergies No current facility-administered medications on file prior to encounter.   Current Outpatient Medications on File Prior to Encounter  Medication Sig Dispense Refill   amLODipine (NORVASC) 5 MG tablet TAKE 1 TABLET BY MOUTH EVERYDAY AT BEDTIME 90 tablet 1   ferrous sulfate 325 (65 FE) MG tablet Take 1 tablet (325 mg total) by mouth every other day. 30 tablet 3   rivaroxaban (XARELTO) 20 MG TABS tablet Take 1 tablet (20 mg total) by mouth daily. 90 tablet 1    Objective: BP 118/68   Pulse 85   Temp (!) 97.4 F (36.3 C) (Oral)   Resp 15   SpO2 99%  Exam: General: Awake, alert and appropriately responsive in NAD HEENT: EOMI, Normocephalic, no JVP Neck: Supple Chest: CTAB, normal WOB. Good air movement bilaterally.   Heart: RRR, 2/6 systolic murmur over the aortic post Abdomen: Soft, non-tender, non-distended. Normoactive bowel sounds. Extremities: Moves all extremities equally. No edema appreciated MSK: Normal bulk and tone Neuro: Appropriately responsive to  stimuli. No gross deficits appreciated. Speech is clear. Skin: No rashes or lesions appreciated.   Labs and Imaging: CBC BMET  Recent Labs  Lab 03/11/21 1145  WBC 5.3  HGB 10.8*  HCT 35.9*  PLT 267   Recent Labs  Lab 03/11/21 1145  NA 140  K 3.8  CL 108  CO2 22  BUN 14  CREATININE 1.16*  GLUCOSE 107*  CALCIUM 9.5     CT HEAD WO CONTRAST (5MM)  Result Date: 03/11/2021 CLINICAL DATA:  Fall EXAM: CT  HEAD WITHOUT CONTRAST CT CERVICAL SPINE WITHOUT CONTRAST TECHNIQUE: Multidetector CT imaging of the head and cervical spine was performed following the standard protocol without intravenous contrast. Multiplanar CT image reconstructions of the cervical spine were also generated. COMPARISON:  None. FINDINGS: CT HEAD FINDINGS Brain: No evidence of acute infarction, hemorrhage, hydrocephalus, extra-axial collection or mass lesion/mass effect. Vascular: No hyperdense vessel or unexpected calcification. Skull: Normal. Negative for fracture or focal lesion. Sinuses/Orbits: No acute finding. Total opacification of the left sphenoid sinus, with bony sinus wall thickening. Other: None. CT CERVICAL SPINE FINDINGS Alignment: Degenerative straightening and reversal of the normal cervical lordosis. Skull base and vertebrae: No acute fracture. No primary bone lesion or focal pathologic process. Soft tissues and spinal canal: No prevertebral fluid or swelling. No visible canal hematoma. Disc levels: Moderate to severe multilevel disc space height loss and osteophytosis, worst from C5 through C7. Upper chest: Negative. Other: None. IMPRESSION: 1. No acute intracranial pathology. 2. Total opacification of the left sphenoid sinus, with bony sinus wall thickening, consistent with chronic sinusitis. 3. No fracture or static subluxation of the cervical spine. 4. 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 Electronically Signed   By: Eddie Candle M.D.   On: 03/11/2021 12:58   CT  Cervical Spine Wo Contrast  Result Date: 03/11/2021 CLINICAL DATA:  Fall EXAM: CT HEAD WITHOUT CONTRAST CT CERVICAL SPINE WITHOUT CONTRAST TECHNIQUE: Multidetector CT imaging of the head and cervical spine was performed following the standard protocol without intravenous contrast. Multiplanar CT image reconstructions of the cervical spine were also generated. COMPARISON:  None. FINDINGS: CT HEAD FINDINGS Brain: No evidence of acute infarction, hemorrhage, hydrocephalus, extra-axial collection or mass lesion/mass effect. Vascular: No hyperdense vessel or unexpected calcification. Skull: Normal. Negative for fracture or focal lesion. Sinuses/Orbits: No acute finding. Total opacification of the left sphenoid sinus, with bony sinus wall thickening. Other: None. CT CERVICAL SPINE FINDINGS Alignment: Degenerative straightening and reversal of the normal cervical lordosis. Skull base and vertebrae: No acute fracture. No primary bone lesion or focal pathologic process. Soft tissues and spinal canal: No prevertebral fluid or swelling. No visible canal hematoma. Disc levels: Moderate to severe multilevel disc space height loss and osteophytosis, worst from C5 through C7. Upper chest: Negative. Other: None. IMPRESSION: 1. No acute intracranial pathology. 2. Total opacification of the left sphenoid sinus, with bony sinus wall thickening, consistent with chronic sinusitis. 3. No fracture or static subluxation of the cervical spine. 4. 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 Electronically Signed   By: Eddie Candle M.D.   On: 03/11/2021 12:58   DG Chest Portable 1 View  Result Date: 03/11/2021 CLINICAL DATA:  75 year old female with history of syncope. EXAM: PORTABLE CHEST 1 VIEW COMPARISON:  Chest x-ray 11/01/2018. FINDINGS: Lung volumes are slightly low with elevation of the left hemidiaphragm. No consolidative airspace disease. No pleural effusions. No pneumothorax. No pulmonary  nodule or mass noted. Pulmonary vasculature and the cardiomediastinal silhouette are within normal limits. Atherosclerosis in the thoracic aorta. IMPRESSION: 1.  No radiographic evidence of acute cardiopulmonary disease. 2. Aortic atherosclerosis. Electronically Signed   By: Vinnie Langton M.D.   On: 03/11/2021 12:13   ECHOCARDIOGRAM COMPLETE  Result Date: 03/11/2021    ECHOCARDIOGRAM REPORT   Patient Name:   MIRELA RAVENEL Date of Exam: 03/11/2021 Medical Rec #:  [id number]       Height:       64.0 in Accession #:  [id number]      Weight:       181.0 lb Date of Birth:  12-17-1945      BSA:          1.875 m Patient Age:    80 years        BP:           124/61 mmHg Patient Gender: F               HR:           83 bpm. Exam Location:  Inpatient Procedure: 2D Echo, Color Doppler and Limited Color Doppler Indications:    Syncope R55  History:        Patient has prior history of Echocardiogram examinations, most                 recent 01/14/2014.  Sonographer:    Merrie Roof RDCS Referring Phys: [id number] ADAM CURATOLO IMPRESSIONS  1. Left ventricular ejection fraction, by estimation, is 65 to 70%. The left ventricle has normal function. Left ventricular endocardial border not optimally defined to evaluate regional wall motion. There is mild left ventricular hypertrophy. Left ventricular diastolic parameters are consistent with Grade I diastolic dysfunction (impaired relaxation).  2. Right ventricular systolic function is normal. The right ventricular size is normal. Tricuspid regurgitation signal is inadequate for assessing PA pressure.  3. The mitral valve is grossly normal. Trivial mitral valve regurgitation. No evidence of mitral stenosis.  4. The aortic valve is grossly normal. Aortic valve regurgitation is not visualized. No aortic stenosis is present.  5. The inferior vena cava is normal in size with greater than 50% respiratory variability, suggesting right atrial pressure of 3 mmHg. FINDINGS  Left Ventricle:  Left ventricular ejection fraction, by estimation, is 65 to 70%. The left ventricle has normal function. Left ventricular endocardial border not optimally defined to evaluate regional wall motion. The left ventricular internal cavity size was normal in size. There is mild left ventricular hypertrophy. Left ventricular diastolic parameters are consistent with Grade I diastolic dysfunction (impaired relaxation). Right Ventricle: The right ventricular size is normal. No increase in right ventricular wall thickness. Right ventricular systolic function is normal. Tricuspid regurgitation signal is inadequate for assessing PA pressure. Left Atrium: Left atrial size was normal in size. Right Atrium: Right atrial size was normal in size. Pericardium: There is no evidence of pericardial effusion. Mitral Valve: The mitral valve is grossly normal. Mild mitral annular calcification. Trivial mitral valve regurgitation. No evidence of mitral valve stenosis. Tricuspid Valve: The tricuspid valve is normal in structure. Tricuspid valve regurgitation is not demonstrated. No evidence of tricuspid stenosis. Aortic Valve: The aortic valve is grossly normal. Aortic valve regurgitation is not visualized. No aortic stenosis is present. Aortic valve mean gradient measures 4.0 mmHg. Aortic valve peak gradient measures 7.6 mmHg. Pulmonic Valve: The pulmonic valve was not well visualized. Pulmonic valve regurgitation is not visualized. No evidence of pulmonic stenosis. Aorta: The aortic root is normal in size and structure. Venous: The inferior vena cava is normal in size with greater than 50% respiratory variability, suggesting right atrial pressure of 3 mmHg. IAS/Shunts: No atrial level shunt detected by color flow Doppler.  LEFT VENTRICLE PLAX 2D LVIDd:         3.60 cm Diastology LVIDs:         2.20 cm LV e' medial:    8.16 cm/s LV PW:         1.00 cm LV E/e' medial:  9.6 LV IVS:        1.10 cm LV e' lateral:   6.64 cm/s                         LV E/e' lateral: 11.8  RIGHT VENTRICLE RV Basal diam:  2.90 cm LEFT ATRIUM           Index       RIGHT ATRIUM           Index LA diam:      2.90 cm 1.55 cm/m  RA Area:     14.60 cm LA Vol (A2C): 62.5 ml 33.33 ml/m RA Volume:   34.65 ml  18.48 ml/m  AORTIC VALVE AV Vmax:           138.00 cm/s AV Vmean:          97.100 cm/s AV VTI:            0.281 m AV Peak Grad:      7.6 mmHg AV Mean Grad:      4.0 mmHg LVOT Vmax:         110.00 cm/s LVOT Vmean:        77.100 cm/s LVOT VTI:          0.230 m LVOT/AV VTI ratio: 0.82  AORTA Ao Root diam: 2.90 cm MITRAL VALVE MV Area (PHT): 3.23 cm     SHUNTS MV Decel Time: 235 msec     Systemic VTI: 0.23 m MV E velocity: 78.40 cm/s MV A velocity: 105.00 cm/s MV E/A ratio:  0.75 Cherlynn Kaiser MD Electronically signed by Cherlynn Kaiser MD Signature Date/Time: 03/11/2021/5:45:14 PM    Final      EKG: Normal sinus rhythm, narrow QRS, elevated T waves  Wells Guiles, DO 03/11/2021, 1:19 PM PGY-1, [place] [place] Intern pager: [phone], text pages welcome   FPTS Upper-Level Resident Addendum   I have independently interviewed and examined the patient. I have discussed the above with the original author and agree with their documentation. My edits for correction/addition/clarification are included where appropriate. Please see also any attending notes.   Sharion Settler, DO PGY-2, [facility] 03/11/2021 5:53 PM  [facility] pager: [phone] (text pages welcome through [facility])

## 2021-03-12 NOTE — Progress Notes (Signed)
Patient requested to leave AMA. Dr. Volanda Napoleon and I spoke with the patient at bedside and discussed risks of leaving AMA including a repeat event and/or worsening of condition. We spoke with attending physician Dr. Gwendlyn Deutscher and relayed strict return precautions to the patient and her son. The patient provided verbal understanding.

## 2021-04-03 ENCOUNTER — Ambulatory Visit: Payer: Medicare HMO | Admitting: Family Medicine

## 2021-04-05 ENCOUNTER — Ambulatory Visit (INDEPENDENT_AMBULATORY_CARE_PROVIDER_SITE_OTHER): Payer: Medicare HMO | Admitting: Family Medicine

## 2021-04-05 ENCOUNTER — Ambulatory Visit: Payer: Medicare HMO | Admitting: Family Medicine

## 2021-04-05 ENCOUNTER — Other Ambulatory Visit: Payer: Self-pay

## 2021-04-05 ENCOUNTER — Telehealth: Payer: Self-pay | Admitting: Family Medicine

## 2021-04-05 ENCOUNTER — Encounter: Payer: Self-pay | Admitting: Family Medicine

## 2021-04-05 DIAGNOSIS — H00011 Hordeolum externum right upper eyelid: Secondary | ICD-10-CM | POA: Diagnosis not present

## 2021-04-05 MED ORDER — FERROUS SULFATE 325 (65 FE) MG PO TABS: 325.0000 mg | ORAL_TABLET | ORAL | 1 refills | Status: DC

## 2021-04-05 NOTE — Progress Notes (Signed)
    SUBJECTIVE:   CHIEF COMPLAINT / HPI: stye  She now has a stye over her right eye that has been present for 1 day. She reports that it is painful but has not affected her vision. She reports she does not have this problem often. She denies sore throat, ear pain or hearing loss. Denies fevers or chills.  Anemia of chronic blood loss  Patient is requesting refill for iron pills. Denies any current problems with bleeding at this time.   PERTINENT  PMH / PSH:  HTN Anemia of chronic blood loss  GERD   OBJECTIVE:   BP (!) 164/70   Pulse 72   Wt 182 lb 6.4 oz (82.7 kg)   SpO2 100%   BMI 31.31 kg/m   General: female appearing stated age in no acute distress HEENT: erythema on upper eyelid of right eye Cardio: Normal S1 and S2, no S3 or S4. Rhythm is regular Pulm: Clear to auscultation bilaterally, no crackles, wheezing, or diminished breath sounds. Normal respiratory effort, stable on RA Abdomen: Bowel sounds normal. Abdomen soft and non-tender.  Extremities: No peripheral edema. Warm/ well perfused.    ASSESSMENT/PLAN:   Hordeolum externum (stye) Advised patient to use warm compress over eyelid at tolerable temp up to three times daily as able  No purulent drainage or abnormal vision nor EOMI on exam today, no fever, sore throat, ear or throat pain today.      Eulis Foster, MD [facility]

## 2021-04-05 NOTE — Telephone Encounter (Signed)
Pt came to appt that was cancelled. Pt had no knowledge of why it had been cancelled nor was a note in the system .  Reschedule for 4:10 today with Dr. Rosita Fire.  Pt request Dr. Ardelia Mems to call her  [phone]

## 2021-04-05 NOTE — Patient Instructions (Addendum)
Today we refilled your iron tablets.   We also will plan to have you follow up in 4 weeks to check your blood pressure and follow up after your cardiology visit.    Stye A stye, also known as a hordeolum, is a bump that forms on an eyelid. It may look like a pimple next to the eyelash. A stye can form inside the eyelid (internal stye) or outside the eyelid (external stye). A stye can cause redness, swelling, and pain on the eyelid. Styes are very common. Anyone can get them at any age. They usually occur in just one eye at a time, but you may have more than one in either eye. What are the causes? A stye is caused by an infection. The infection is almost always caused by bacteria called Staphylococcus aureus. This is a common type of bacteria that lives on the skin. An internal stye may result from an infected oil-producing gland inside the eyelid. An external stye may be caused by an infection at the base of the eyelash (hair follicle). What increases the risk? You are more likely to develop a stye if: You have had a stye before. You have any of these conditions: Red, itchy, inflamed eyelids (blepharitis). A skin condition such as seborrheic dermatitis or rosacea. High fat levels in your blood (lipids). Dry eyes. What are the signs or symptoms? The most common symptom of a stye is eyelid pain. Internal styes are more painful than external styes. Other symptoms may include: Painful swelling of your eyelid. A scratchy feeling in your eye. Tearing and redness of your eye. A pimple-like bump on the edge of the eyelid. Pus draining from the stye. How is this diagnosed? Your health care provider may be able to diagnose a stye just by examining your eye. The health care provider may also check to make sure: You do not have a fever or other signs of a more serious infection. The infection has not spread to other parts of your eye or areas around your eye. How is this treated? Most styes will  clear up in a few days without treatment or with warm compresses applied to the area. You may need to use antibiotic drops or ointment to treat an infection. Sometimes, steroid drops or ointment are used in addition to antibiotics. In some cases, your health care provider may give you a small steroid injection in the eyelid. If your stye does not heal with routine treatment, your health care provider may drain pus from the stye using a thin blade or needle. This may be done if the stye is large, causing a lot of pain, or affecting your vision. Follow these instructions at home: Take over-the-counter and prescription medicines only as told by your health care provider. This includes eye drops or ointments. If you were prescribed an antibiotic medicine, steroid medicine, or both, apply or use them as told by your health care provider. Do not stop using the medicine even if your condition improves. Apply a warm, wet cloth (warm compress) to your eye for 5-10 minutes, 4 to 6 times a day. Clean the affected eyelid as directed by your health care provider. Do not wear contact lenses or eye makeup until your stye has healed and your health care provider says that it is safe. Do not try to pop or drain the stye. Do not rub your eye. Contact a health care provider if: You have chills or a fever. Your stye does not go away after  several days. Your stye affects your vision. Your eyeball becomes swollen, red, or painful. Get help right away if: You have pain when moving your eye around. Summary A stye is a bump that forms on an eyelid. It may look like a pimple next to the eyelash. A stye can form inside the eyelid (internal stye) or outside the eyelid (external stye). A stye can cause redness, swelling, and pain on the eyelid. Your health care provider may be able to diagnose a stye just by examining your eye. Apply a warm, wet cloth (warm compress) to your eye for 5-10 minutes, 4 to 6 times a day. This  information is not intended to replace advice given to you by your health care provider. Make sure you discuss any questions you have with your health care provider. Document Revised: 09/27/2020 Document Reviewed: 09/27/2020 Elsevier Patient Education  [place].

## 2021-04-06 NOTE — Telephone Encounter (Signed)
Returned call to patient, no answer. Left HIPAA compliant voicemail offering for her to call back.  Leeanne Rio, MD

## 2021-04-10 DIAGNOSIS — H00019 Hordeolum externum unspecified eye, unspecified eyelid: Secondary | ICD-10-CM | POA: Insufficient documentation

## 2021-04-10 NOTE — Assessment & Plan Note (Signed)
Advised patient to use warm compress over eyelid at tolerable temp up to three times daily as able  No purulent drainage or abnormal vision nor EOMI on exam today, no fever, sore throat, ear or throat pain today.

## 2021-04-11 DIAGNOSIS — Z9989 Dependence on other enabling machines and devices: Secondary | ICD-10-CM | POA: Insufficient documentation

## 2021-04-11 DIAGNOSIS — R011 Cardiac murmur, unspecified: Secondary | ICD-10-CM | POA: Insufficient documentation

## 2021-04-11 DIAGNOSIS — Z972 Presence of dental prosthetic device (complete) (partial): Secondary | ICD-10-CM | POA: Insufficient documentation

## 2021-04-11 DIAGNOSIS — G4730 Sleep apnea, unspecified: Secondary | ICD-10-CM | POA: Insufficient documentation

## 2021-04-11 DIAGNOSIS — R413 Other amnesia: Secondary | ICD-10-CM | POA: Insufficient documentation

## 2021-04-11 DIAGNOSIS — I82409 Acute embolism and thrombosis of unspecified deep veins of unspecified lower extremity: Secondary | ICD-10-CM | POA: Insufficient documentation

## 2021-04-11 DIAGNOSIS — I2699 Other pulmonary embolism without acute cor pulmonale: Secondary | ICD-10-CM | POA: Insufficient documentation

## 2021-04-11 DIAGNOSIS — Z973 Presence of spectacles and contact lenses: Secondary | ICD-10-CM | POA: Insufficient documentation

## 2021-04-12 ENCOUNTER — Other Ambulatory Visit: Payer: Self-pay

## 2021-04-12 ENCOUNTER — Ambulatory Visit: Payer: Medicare HMO | Admitting: Cardiology

## 2021-04-12 ENCOUNTER — Encounter: Payer: Self-pay | Admitting: Cardiology

## 2021-04-12 ENCOUNTER — Ambulatory Visit (INDEPENDENT_AMBULATORY_CARE_PROVIDER_SITE_OTHER): Payer: Medicare HMO

## 2021-04-12 VITALS — BP 146/74 | HR 80 | Ht 64.0 in | Wt 185.1 lb

## 2021-04-12 DIAGNOSIS — E782 Mixed hyperlipidemia: Secondary | ICD-10-CM | POA: Diagnosis not present

## 2021-04-12 DIAGNOSIS — I8290 Acute embolism and thrombosis of unspecified vein: Secondary | ICD-10-CM

## 2021-04-12 DIAGNOSIS — R55 Syncope and collapse: Secondary | ICD-10-CM | POA: Diagnosis not present

## 2021-04-12 DIAGNOSIS — I10 Essential (primary) hypertension: Secondary | ICD-10-CM | POA: Diagnosis not present

## 2021-04-12 DIAGNOSIS — E669 Obesity, unspecified: Secondary | ICD-10-CM | POA: Diagnosis not present

## 2021-04-12 NOTE — Patient Instructions (Signed)
Medication Instructions:  Your physician recommends that you continue on your current medications as directed. Please refer to the Current Medication list given to you today.  *If you need a refill on your cardiac medications before your next appointment, please call your pharmacy*   Lab Work: Your physician recommends that you return for lab work in:  TODAY: CBC If you have labs (blood work) drawn today and your tests are completely normal, you will receive your results only by: MyChart Message (if you have [facility]) OR A paper copy in the mail If you have any lab test that is abnormal or we need to change your treatment, we will call you to review the results.   Testing/Procedures: A zio monitor was ordered today. It will remain on for 14 days. You will then return monitor and event diary in provided box. It takes 1-2 weeks for report to be downloaded and returned to [place]. We will call you with the results. If monitor falls off or has orange flashing light, please call Zio for further instructions.   ZIO  WHY IS MY DOCTOR PRESCRIBING ZIO? The Zio system is proven and trusted by physicians to detect and diagnose irregular heart rhythms -- and has been prescribed to hundreds of thousands of patients.  The FDA has cleared the Zio system to monitor for many different kinds of irregular heart rhythms. In a study, physicians were able to reach a diagnosis 90% of the time with the Zio system1.  You can wear the Zio monitor -- a small, discreet, comfortable patch -- during your normal day-to-day activity, including while you sleep, shower, and exercise, while it records every single heartbeat for analysis.  1Barrett, P., et al. Comparison of 24 Hour Holter Monitoring Versus 14 Day Novel Adhesive Patch Electrocardiographic Monitoring. [place], 2014.  ZIO VS. HOLTER MONITORING The Zio monitor can be comfortably worn for up to 14 days. Holter monitors can be worn for 24 to 48  hours, limiting the time to record any irregular heart rhythms you may have. Zio is able to capture data for the 51% of patients who have their first symptom-triggered arrhythmia after 48 hours.1  LIVE WITHOUT RESTRICTIONS The Zio ambulatory cardiac monitor is a small, unobtrusive, and water-resistant patch--you might even forget you're wearing it. The Zio monitor records and stores every beat of your heart, whether you're sleeping, working out, or showering. Remove on: Sept 22nd 2022   Follow-Up: At [facility], you and your health needs are our priority.  As part of our continuing mission to provide you with exceptional heart care, we have created designated Provider Care Teams.  These Care Teams include your primary Cardiologist (physician) and Advanced Practice Providers (APPs -  Physician Assistants and Nurse Practitioners) who all work together to provide you with the care you need, when you need it.  We recommend signing up for the patient portal called "MyChart".  Sign up information is provided on this After Visit Summary.  MyChart is used to connect with patients for Virtual Visits (Telemedicine).  Patients are able to view lab/test results, encounter notes, upcoming appointments, etc.  Non-urgent messages can be sent to your provider as well.   To learn more about what you can do with MyChart, go to [email or web address].    Your next appointment:   6 month(s)  The format for your next appointment:   In person  Provider:    Berniece Salines, DO [address], [place], [place] [address]  Other Instructions

## 2021-04-12 NOTE — Progress Notes (Signed)
Cardiology Office Note:    Date:  04/13/2021   ID:  [name], DOB 04-Nov-1945, MRN [id number]  PCP:  Leeanne Rio, MD  Cardiologist:  Berniece Salines, DO  Electrophysiologist:  None   Referring MD: Leeanne Rio, MD   " I am feeling fine"  History of Present Illness:    [name] is a 75 y.o. female with a hx of   Past Medical History:  Diagnosis Date   Ambulates with cane    Anemia    hx of yrs ago   DJD (degenerative joint disease) of hip    GERD (gastroesophageal reflux disease)    h/o in the 1990s   Heart murmur    mild no cardiologist   Hyperlipidemia    Hypertension    sees Dr. 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  had retroperiotneal hemorrahge with IVC filter placement after recurrent PE/DVT   Recurrent pulmonary embolism [facility]    see pulmonologist Dr. Asencion Noble   Sleep apnea    sleep study 2017 no cpap given   Wears glasses for reading   Wears partial dentures upper    Past Surgical History:  Procedure Laterality Date   CARPAL TUNNEL RELEASE  09/02/2012   Procedure: CARPAL TUNNEL RELEASE;  Surgeon: Winfield Cunas, MD;  Location: [place] [facility];  Service: Neurosurgery;  Laterality: Right;  RIGHT carpal tunnel release   DILATION AND CURETTAGE OF UTERUS  02/21/2012   Procedure: DILATATION AND CURETTAGE;  Surgeon: Emily Filbert, MD;  Location: [place] [facility];  Service: Gynecology;  Laterality: N/A;   Femoral vein ligation  12/2002   [place] filter  11/03/2003   R Jugular   HYSTEROSCOPY WITH D & C N/A 10/03/2020   Procedure: DILATATION AND CURETTAGE /HYSTEROSCOPY WITH MYOSURE;  Surgeon: Mora Bellman, MD;  Location: [place];  Service: Gynecology;  Laterality: N/A;   SHOULDER SURGERY  Left  07/15/08   Dr Hal Morales   TOTAL HIP ARTHROPLASTY  R 05/2203, L 2010   TRANSTHORACIC ECHOCARDIOGRAM     EF 55-60%, RV mod dilated, mildly increased LV wall thickness   TUBAL LIGATION  1981    Current Medications: Current Meds  Medication Sig   amLODipine (NORVASC) 5 MG tablet TAKE 1 TABLET BY MOUTH EVERYDAY AT BEDTIME (Patient taking differently: Take 5 mg by mouth at bedtime.)   ferrous sulfate 325 (65 FE) MG tablet Take 1 tablet (325 mg total) by mouth every other day.   rivaroxaban (XARELTO) 20 MG TABS tablet Take 1 tablet (20 mg total) by mouth daily.     Allergies:   Patient has no known allergies.   Social History   Socioeconomic History   Marital status: Widowed    Spouse name: Not on file   Number of children: 2   Years of education: College   Highest education level: Associate degree: occupational, Hotel manager, or vocational program  Occupational History    Employer: RETIRED  Tobacco Use   Smoking status: Never    Passive exposure: Never   Smokeless tobacco: Never  Vaping Use   Vaping Use: Never used  Substance and Sexual Activity   Alcohol use: No   Drug use: No   Sexual  activity: Not Currently    Birth control/protection: Post-menopausal  Other Topics Concern   Not on file  Social History Narrative   Patient lives with her son and adopted daughter with special needs.    Patient is active in her church and with her family.   Patient enjoys sewing, crocheting, cooking, and spending time with family.    Patient has 2 biological sons and 2 adopted daughters.          Social Determinants of Health   Financial Resource Strain: Low Risk    Difficulty of Paying Living Expenses: Not hard at all  Food Insecurity: No Food Insecurity   Worried About Charity fundraiser in the Last Year: Never true   Banquete in the Last Year: Never true  Transportation Needs: No Transportation Needs   Lack of Transportation (Medical): No   Lack of Transportation (Non-Medical):  No  Physical Activity: Inactive   Days of Exercise per Week: 0 days   Minutes of Exercise per Session: 0 min  Stress: No Stress Concern Present   Feeling of Stress : Not at all  Social Connections: Moderately Isolated   Frequency of Communication with Friends and Family: More than three times a week   Frequency of Social Gatherings with Friends and Family: More than three times a week   Attends Religious Services: More than 4 times per year   Active Member of [employer] or Organizations: No   Attends Archivist Meetings: Never   Marital Status: Widowed     Family History: The patient's family history includes Alcohol abuse in her sister; Cancer in her brother and sister; Heart attack in her father and mother; Heart disease in her father and mother. There is no history of Colon cancer, Colon polyps, Esophageal cancer, Rectal cancer, or Stomach cancer.  ROS:   Review of Systems  Constitution: Negative for decreased appetite, fever and weight gain.  HENT: Negative for congestion, ear discharge, hoarse voice and sore throat.   Eyes: Negative for discharge, redness, vision loss in right eye and visual halos.  Cardiovascular: Negative for chest pain, dyspnea on exertion, leg swelling, orthopnea and palpitations.  Respiratory: Negative for cough, hemoptysis, shortness of breath and snoring.   Endocrine: Negative for heat intolerance and polyphagia.  Hematologic/Lymphatic: Negative for bleeding problem. Does not bruise/bleed easily.  Skin: Negative for flushing, nail changes, rash and suspicious lesions.  Musculoskeletal: Negative for arthritis, joint pain, muscle cramps, myalgias, neck pain and stiffness.  Gastrointestinal: Negative for abdominal pain, bowel incontinence, diarrhea and excessive appetite.  Genitourinary: Negative for decreased libido, genital sores and incomplete emptying.  Neurological: Negative for brief paralysis, focal weakness, headaches and loss of balance.   Psychiatric/Behavioral: Negative for altered mental status, depression and suicidal ideas.  Allergic/Immunologic: Negative for HIV exposure and persistent infections.    EKGs/Labs/Other Studies Reviewed:    The following studies were reviewed today:   EKG:  The ekg ordered today demonstrates   TTE 03/11/2021 IMPRESSIONS     1. Left ventricular ejection fraction, by estimation, is 65 to 70%. The  left ventricle has normal function. Left ventricular endocardial border  not optimally defined to evaluate regional wall motion. There is mild left  ventricular hypertrophy. Left  ventricular diastolic parameters are consistent with Grade I diastolic  dysfunction (impaired relaxation).   2. Right ventricular systolic function is normal. The right ventricular  size is normal. Tricuspid regurgitation signal is inadequate for assessing  PA pressure.   3.  The mitral valve is grossly normal. Trivial mitral valve  regurgitation. No evidence of mitral stenosis.   4. The aortic valve is grossly normal. Aortic valve regurgitation is not  visualized. No aortic stenosis is present.   5. The inferior vena cava is normal in size with greater than 50%  respiratory variability, suggesting right atrial pressure of 3 mmHg.   FINDINGS   Left Ventricle: Left ventricular ejection fraction, by estimation, is 65  to 70%. The left ventricle has normal function. Left ventricular  endocardial border not optimally defined to evaluate regional wall motion.  The left ventricular internal cavity  size was normal in size. There is mild left ventricular hypertrophy. Left  ventricular diastolic parameters are consistent with Grade I diastolic  dysfunction (impaired relaxation).   Right Ventricle: The right ventricular size is normal. No increase in  right ventricular wall thickness. Right ventricular systolic function is  normal. Tricuspid regurgitation signal is inadequate for assessing PA  pressure.   Left  Atrium: Left atrial size was normal in size.   Right Atrium: Right atrial size was normal in size.   Pericardium: There is no evidence of pericardial effusion.   Mitral Valve: The mitral valve is grossly normal. Mild mitral annular  calcification. Trivial mitral valve regurgitation. No evidence of mitral  valve stenosis.   Tricuspid Valve: The tricuspid valve is normal in structure. Tricuspid  valve regurgitation is not demonstrated. No evidence of tricuspid  stenosis.   Aortic Valve: The aortic valve is grossly normal. Aortic valve  regurgitation is not visualized. No aortic stenosis is present. Aortic  valve mean gradient measures 4.0 mmHg. Aortic valve peak gradient measures  7.6 mmHg.   Pulmonic Valve: The pulmonic valve was not well visualized. Pulmonic valve  regurgitation is not visualized. No evidence of pulmonic stenosis.   Aorta: The aortic root is normal in size and structure.   Venous: The inferior vena cava is normal in size with greater than 50%  respiratory variability, suggesting right atrial pressure of 3 mmHg.   IAS/Shunts: No atrial level shunt detected by color flow Doppler.   Recent Labs: 05/02/2020: TSH 1.740 03/11/2021: ALT 9; BUN 14; Creatinine, Ser 1.16; Potassium 3.8; Sodium 140 04/12/2021: Hemoglobin 10.8; Platelets 347  Recent Lipid Panel    Component Value Date/Time   CHOL 204 (H) 03/11/2021 1451   CHOL 247 (H) 11/25/2019 1049   TRIG 61 03/11/2021 1451   HDL 57 03/11/2021 1451   HDL 70 11/25/2019 1049   CHOLHDL 3.6 03/11/2021 1451   VLDL 12 03/11/2021 1451   LDLCALC 135 (H) 03/11/2021 1451   LDLCALC 156 (H) 11/25/2019 1049   LDLDIRECT 170 (H) 02/09/2013 1614    Physical Exam:    VS:  BP (!) 146/74 (BP Location: Right Arm, Patient Position: Sitting, Cuff Size: Large)   Pulse 80   Ht '5\' 4"'$  (1.626 m)   Wt 185 lb 1.9 oz (84 kg)   SpO2 99%   BMI 31.78 kg/m     Wt Readings from Last 3 Encounters:  04/12/21 185 lb 1.9 oz (84 kg)  04/05/21  182 lb 6.4 oz (82.7 kg)  03/11/21 181 lb (82.1 kg)     GEN: Well nourished, well developed in no acute distress HEENT: Normal NECK: No JVD; No carotid bruits LYMPHATICS: No lymphadenopathy CARDIAC: S1S2 noted,RRR, 3/6 holosystolic murmurs, rubs, gallops RESPIRATORY:  Clear to auscultation without rales, wheezing or rhonchi  ABDOMEN: Soft, non-tender, non-distended, +bowel sounds, no guarding. EXTREMITIES: No edema, No cyanosis,  no clubbing MUSCULOSKELETAL:  No deformity  SKIN: Warm and dry NEUROLOGIC:  Alert and oriented x 3, non-focal PSYCHIATRIC:  Normal affect, good insight  ASSESSMENT:    1. Syncope and collapse   2. Hypertension, unspecified type   3. VTE (venous thromboembolism)   4. Mixed hyperlipidemia   5. Obesity (BMI 30-39.9)    PLAN:    I would like to rule out a cardiovascular etiology of this palpitation, therefore at this time I would like to placed a zio patch for 14 days. In additon a transthoracic echocardiogram will be ordered to assess LV/RV function and any structural abnormalities. Once these testing have been performed amd reviewed further reccomendations will be made. For now, I do reccomend that the patient goes to the nearest ED if  symptoms recur.  She does have a chronic anemia, will get cbc.  Blood pressure is acceptable, continue with current antihypertensive regimen.  Encourage diet and exercise  The patient is in agreement with the above plan. The patient left the office in stable condition.  The patient will follow up in 6 months or sooner if needed.   Medication Adjustments/Labs and Tests Ordered: Current medicines are reviewed at length with the patient today.  Concerns regarding medicines are outlined above.  Orders Placed This Encounter  Procedures   CBC with Differential/Platelet   LONG TERM MONITOR-LIVE TELEMETRY (3-14 DAYS)   No orders of the defined types were placed in this encounter.   Patient Instructions  Medication  Instructions:  Your physician recommends that you continue on your current medications as directed. Please refer to the Current Medication list given to you today.  *If you need a refill on your cardiac medications before your next appointment, please call your pharmacy*   Lab Work: Your physician recommends that you return for lab work in:  TODAY: CBC If you have labs (blood work) drawn today and your tests are completely normal, you will receive your results only by: MyChart Message (if you have [place]) OR A paper copy in the mail If you have any lab test that is abnormal or we need to change your treatment, we will call you to review the results.   Testing/Procedures: A zio monitor was ordered today. It will remain on for 14 days. You will then return monitor and event diary in provided box. It takes 1-2 weeks for report to be downloaded and returned to [place]. We will call you with the results. If monitor falls off or has orange flashing light, please call Zio for further instructions.   ZIO  WHY IS MY DOCTOR PRESCRIBING ZIO? The Zio system is proven and trusted by physicians to detect and diagnose irregular heart rhythms -- and has been prescribed to hundreds of thousands of patients.  The FDA has cleared the Zio system to monitor for many different kinds of irregular heart rhythms. In a study, physicians were able to reach a diagnosis 90% of the time with the Zio system1.  You can wear the Zio monitor -- a small, discreet, comfortable patch -- during your normal day-to-day activity, including while you sleep, shower, and exercise, while it records every single heartbeat for analysis.  1Barrett, P., et al. Comparison of 24 Hour Holter Monitoring Versus 14 Day Novel Adhesive Patch Electrocardiographic Monitoring. Dixon, 2014.  ZIO VS. HOLTER MONITORING The Zio monitor can be comfortably worn for up to 14 days. Holter monitors can be worn for 24 to 48 hours,  limiting the time to  record any irregular heart rhythms you may have. Zio is able to capture data for the 51% of patients who have their first symptom-triggered arrhythmia after 48 hours.1  LIVE WITHOUT RESTRICTIONS The Zio ambulatory cardiac monitor is a small, unobtrusive, and water-resistant patch--you might even forget you're wearing it. The Zio monitor records and stores every beat of your heart, whether you're sleeping, working out, or showering. Remove on: Sept 22nd 2022   Follow-Up: At [facility], you and your health needs are our priority.  As part of our continuing mission to provide you with exceptional heart care, we have created designated Provider Care Teams.  These Care Teams include your primary Cardiologist (physician) and Advanced Practice Providers (APPs -  Physician Assistants and Nurse Practitioners) who all work together to provide you with the care you need, when you need it.  We recommend signing up for the patient portal called "MyChart".  Sign up information is provided on this After Visit Summary.  MyChart is used to connect with patients for Virtual Visits (Telemedicine).  Patients are able to view lab/test results, encounter notes, upcoming appointments, etc.  Non-urgent messages can be sent to your provider as well.   To learn more about what you can do with MyChart, go to [email or web address].    Your next appointment:   6 month(s)  The format for your next appointment:   In person  Provider:    Berniece Salines, DO [address], [place], [place] [address]    Other Instructions     Adopting a Healthy Lifestyle.  Know what a healthy weight is for you (roughly BMI <25) and aim to maintain this   Aim for 7+ servings of fruits and vegetables daily   65-80+ fluid ounces of water or unsweet tea for healthy kidneys   Limit to max 1 drink of alcohol per day; avoid smoking/tobacco   Limit animal fats in diet for cholesterol and heart health - choose  grass fed whenever available   Avoid highly processed foods, and foods high in saturated/trans fats   Aim for low stress - take time to unwind and care for your mental health   Aim for 150 min of moderate intensity exercise weekly for heart health, and weights twice weekly for bone health   Aim for 7-9 hours of sleep daily   When it comes to diets, agreement about the perfect plan isnt easy to find, even among the experts. Experts at the [employer] developed an idea known as the Healthy Eating Plate. Just imagine a plate divided into logical, healthy portions.   The emphasis is on diet quality:   Load up on vegetables and fruits - one-half of your plate: Aim for color and variety, and remember that potatoes dont count.   Go for whole grains - one-quarter of your plate: Whole wheat, barley, wheat berries, quinoa, oats, brown rice, and foods made with them. If you want pasta, go with whole wheat pasta.   Protein power - one-quarter of your plate: Fish, chicken, beans, and nuts are all healthy, versatile protein sources. Limit red meat.   The diet, however, does go beyond the plate, offering a few other suggestions.   Use healthy plant oils, such as olive, canola, soy, corn, sunflower and peanut. Check the labels, and avoid partially hydrogenated oil, which have unhealthy trans fats.   If youre thirsty, drink water. Coffee and tea are good in moderation, but skip sugary drinks and limit milk and dairy  products to one or two daily servings.   The type of carbohydrate in the diet is more important than the amount. Some sources of carbohydrates, such as vegetables, fruits, whole grains, and beans-are healthier than others.   Finally, stay active  Signed, Berniece Salines, DO  04/13/2021 8:20 AM    [place]

## 2021-04-13 DIAGNOSIS — E669 Obesity, unspecified: Secondary | ICD-10-CM | POA: Insufficient documentation

## 2021-04-13 DIAGNOSIS — R55 Syncope and collapse: Secondary | ICD-10-CM | POA: Diagnosis not present

## 2021-04-13 LAB — CBC WITH DIFFERENTIAL/PLATELET
Basophils Absolute: 0 10*3/uL (ref 0.0–0.2)
Basos: 1 %
EOS (ABSOLUTE): 0.1 10*3/uL (ref 0.0–0.4)
Eos: 1 %
Hematocrit: 34.2 % (ref 34.0–46.6)
Hemoglobin: 10.8 g/dL — ABNORMAL LOW (ref 11.1–15.9)
Immature Grans (Abs): 0 10*3/uL (ref 0.0–0.1)
Immature Granulocytes: 0 %
Lymphocytes Absolute: 2 10*3/uL (ref 0.7–3.1)
Lymphs: 37 %
MCH: 27.1 pg (ref 26.6–33.0)
MCHC: 31.6 g/dL (ref 31.5–35.7)
MCV: 86 fL (ref 79–97)
Monocytes Absolute: 0.4 10*3/uL (ref 0.1–0.9)
Monocytes: 8 %
Neutrophils Absolute: 2.9 10*3/uL (ref 1.4–7.0)
Neutrophils: 53 %
Platelets: 347 10*3/uL (ref 150–450)
RBC: 3.99 x10E6/uL (ref 3.77–5.28)
RDW: 15.3 % (ref 11.7–15.4)
WBC: 5.5 10*3/uL (ref 3.4–10.8)

## 2021-05-02 ENCOUNTER — Ambulatory Visit: Payer: Medicare HMO | Admitting: Cardiology

## 2021-05-16 ENCOUNTER — Other Ambulatory Visit: Payer: Self-pay | Admitting: Family Medicine

## 2021-05-17 ENCOUNTER — Ambulatory Visit
Admission: RE | Admit: 2021-05-17 | Discharge: 2021-05-17 | Disposition: A | Discharge status: Home / Self Care | Payer: Medicare HMO | Source: Ambulatory Visit | Attending: Surgery | Admitting: Surgery

## 2021-05-17 ENCOUNTER — Other Ambulatory Visit: Payer: Self-pay

## 2021-05-17 ENCOUNTER — Other Ambulatory Visit: Payer: Self-pay | Admitting: Surgery

## 2021-05-17 DIAGNOSIS — D241 Benign neoplasm of right breast: Secondary | ICD-10-CM

## 2021-05-17 DIAGNOSIS — R922 Inconclusive mammogram: Secondary | ICD-10-CM | POA: Diagnosis not present

## 2021-05-24 ENCOUNTER — Other Ambulatory Visit: Payer: Self-pay | Admitting: Family Medicine

## 2021-05-30 ENCOUNTER — Ambulatory Visit: Payer: Self-pay | Admitting: *Deleted

## 2021-05-30 NOTE — Telephone Encounter (Signed)
    Pt reports small toe of left foot swollen, states noted Monday. Does not recall any injury. States moderate swelling, painful 6-7/10 "Not all the time." Able to wear slip on shoes, able to walk. Denies any redness, states warm to touch. No deformity. Home care advise given. Advised to notify PCP in AM. States will follow disposition, verbalizes understanding of care advise.      Reason for Disposition  Toe pain  Answer Assessment - Initial Assessment Questions 1. ONSET: "When did the pain start?"      Monday 2. LOCATION: "Where is the pain located?"   (e.g., around nail, entire toe, at foot joint)      Small toe left foot 3. PAIN: "How bad is the pain?"    (Scale 1-10; or mild, moderate, severe)   -  MILD (1-3): doesn't interfere with normal activities    -  MODERATE (4-7): interferes with normal activities (e.g., work or school) or awakens from sleep, limping    -  SEVERE (8-10): excruciating pain, unable to do any normal activities, unable to walk     6-7/10 4. APPEARANCE: "What does the toe look like?" (e.g., redness, swelling, bruising, pallor)     Moderate swelling 5. CAUSE: "What do you think is causing the toe pain?"     Unsure 6. OTHER SYMPTOMS: "Do you have any other symptoms?" (e.g., leg pain, rash, fever, numbness)     no  Protocols used: Toe Pain-A-AH

## 2021-05-30 NOTE — Telephone Encounter (Signed)
Pts son called the community line asking to speak with a nurse regarding the pts swollen pinky toe, caller wanted to know what he can do to help the swelling     Called patient's son back to review symptoms of toe swelling. No answer, left message to call back [phone].

## 2021-05-31 ENCOUNTER — Ambulatory Visit: Payer: Medicare HMO

## 2021-06-04 ENCOUNTER — Encounter: Payer: Self-pay | Admitting: Family Medicine

## 2021-06-04 ENCOUNTER — Ambulatory Visit (INDEPENDENT_AMBULATORY_CARE_PROVIDER_SITE_OTHER): Payer: Medicare HMO | Admitting: Family Medicine

## 2021-06-04 ENCOUNTER — Other Ambulatory Visit: Payer: Self-pay

## 2021-06-04 VITALS — BP 140/78 | HR 87 | Wt 184.0 lb

## 2021-06-04 DIAGNOSIS — M79675 Pain in left toe(s): Secondary | ICD-10-CM | POA: Diagnosis not present

## 2021-06-04 NOTE — Patient Instructions (Signed)
I am concerned that you may have injured your left pinkie toe.   Go get an Xray of your left foot.   Each day, wrap the pinkie toe to the toe right beside it.  This will help the pinkie toe heal.   We can figure out how long to keep the toes wrapped togather once we look at the Xrays of your toe.

## 2021-06-04 NOTE — Progress Notes (Signed)
[name] is alone Sources of clinical information for visit is/are patient. Nursing assessment for this office visit was reviewed with the patient for accuracy and revision.     Previous Report(s) Reviewed: none  Depression screen PHQ 2/9 06/04/2021  Decreased Interest 0  Down, Depressed, Hopeless 0  PHQ - 2 Score 0  Altered sleeping 0  Tired, decreased energy 3  Change in appetite 3  Feeling bad or failure about yourself  0  Trouble concentrating 0  Moving slowly or fidgety/restless 0  Suicidal thoughts 0  PHQ-9 Score 6  Difficult doing work/chores -  Some recent data might be hidden   [place] Visit from 06/04/2021 in [place] Office Visit from 04/05/2021 in [place] Office Visit from 11/07/2020 in [place]  Thoughts that you would be better off dead, or of hurting yourself in some way Not at all Not at all Not at all  PHQ-9 Total Score 6 3 2        Fall Risk  06/04/2021 04/05/2021 11/07/2020 11/07/2020 10/05/2020  Falls in the past year? 0 0 0 0 0  Number falls in past yr: 0 0 - - -  Injury with Fall? - 0 - - -  Risk Factor Category  - - - - -  Risk for fall due to : - - - - -    PHQ9 SCORE ONLY 06/04/2021 04/05/2021 11/07/2020  PHQ-9 Total Score 6 3 2     Adult vaccines due  Topic Date Due   TETANUS/TDAP  11/30/2029    Health Maintenance Due  Topic Date Due   Zoster Vaccines- Shingrix (1 of 2) Never done   COLONOSCOPY (Pts 45-60yrs Insurance coverage will need to be confirmed)  Never done   COVID-19 Vaccine (4 - Booster for Pfizer series) 08/21/2020   INFLUENZA VACCINE  03/05/2021      History/P.E. limitations: memory difficulties  Adult vaccines due  Topic Date Due   TETANUS/TDAP  11/30/2029   There are no preventive care reminders to display for this patient.  Health Maintenance Due  Topic Date Due   Zoster Vaccines- Shingrix (1 of 2) Never done   COLONOSCOPY (Pts 45-91yrs  Insurance coverage will need to be confirmed)  Never done   COVID-19 Vaccine (4 - Booster for [place] series) 08/21/2020   INFLUENZA VACCINE  03/05/2021     Chief Complaint  Patient presents with   Toe Pain

## 2021-06-05 ENCOUNTER — Encounter: Payer: Self-pay | Admitting: Family Medicine

## 2021-06-05 DIAGNOSIS — M79675 Pain in left toe(s): Secondary | ICD-10-CM | POA: Insufficient documentation

## 2021-06-05 NOTE — Assessment & Plan Note (Addendum)
Onset: 2 weeks ago Location: left foot 5th digit Quality: only mildly painful with walking Function: no impairment in walking or standing Pattern: stable Relief: has tried nothing Precipitant: Does not recall injuring the toe  Exam Left foot 5th digit possible increase in warmth cf right foot toe. Mild erythema cf right toe Mild edema cf right foot toe Skin intact, no erythema ,edema or increase warmth left foot dorsum  TTP along length 5th digit. No pain over 5th MTP joint.  A/ Acute 5th digit left foot pain - Contusion or fracture  P/ Buddy taping until pain resolved - demonstrated how to apply taping Order for left foot Xrays

## 2021-06-12 ENCOUNTER — Telehealth: Payer: Self-pay | Admitting: General Surgery

## 2021-06-12 ENCOUNTER — Encounter: Payer: Self-pay | Admitting: Gastroenterology

## 2021-06-12 ENCOUNTER — Ambulatory Visit (INDEPENDENT_AMBULATORY_CARE_PROVIDER_SITE_OTHER): Payer: Medicare HMO | Admitting: Gastroenterology

## 2021-06-12 VITALS — BP 138/70 | HR 64 | Ht 64.0 in | Wt 183.0 lb

## 2021-06-12 DIAGNOSIS — R634 Abnormal weight loss: Secondary | ICD-10-CM

## 2021-06-12 DIAGNOSIS — D649 Anemia, unspecified: Secondary | ICD-10-CM | POA: Diagnosis not present

## 2021-06-12 MED ORDER — PLENVU 140 G PO SOLR: 1.0000 | ORAL | 0 refills | Status: DC

## 2021-06-12 NOTE — Progress Notes (Addendum)
Referring Provider: Latrelle Dodrill, MD Primary Care Physician:  Latrelle Dodrill, MD  Chief complaint:  Unexplained weight loss   IMPRESSION:  Unexplained weight loss without localizing GI symptoms    - not explained by CT scan or GYN evaluation Normocytic anemia Moderate sized hiatal hernia Chronic Xarelto Pancolonic diverticulisis No prior colon cancer screening Possible family history of colon cancer (sister)  Endoscopic evaluation for weight loss recommended. I have recommended holding Xarelto for 2 days before endoscopy.  I discussed with the patient that there is a low, but real, risk of a cardiovascular event such as heart attack, stroke, or embolism/thrombosis while off Xarelto. Will communicate by phone or EMR with patient's prescribing provider to confirm that holding the Xarelto is appropriate at this time.     PLAN: EGD with duodenal biopsies and colonoscopy to including evaluation of the TI after a Xarelto washout  I spent over 30 minutes, including in depth chart review, independent review of results, communicating results with the patient directly, face-to-face time with the patient, coordinating care, and ordering studies and medications as appropriate, and documentation.    HPI: [name] is a 75 y.o. female who returns today to discuss endoscopic evaluation. She was seen by Willette Cluster 06/02/20 for a 13 pound unintentional weight loss.  EGD and colonoscopy recommended. However, endoscopy was never performed. This is my first visit with the patient. She returns today accompanied by her son and adopted daughter.   She returns today to discuss endoscopy. She has continued to lose weight since last year with an additional 25 pounds lost since her appointment here last year. She attributes this to clean eating with less candy and hamburgers.   GI ROS is otherwise completely negative.   No family history of GI malignancy or chronic GI disease.   Labs  05/02/20: TSH 1.74 Labs 11/07/20: hemoglobin 11.6 Labs 03/11/21: normal CMP except for glucose 107, crt 1.16, TP 6.2. Albumin and liver enzymes are normal. Hemoglobin 10.8.  Labs 04/12/21: Hemoglobin 10.8, MCV 86, RDW 15.3, platelets 347  CT abd/pelvis with contrast 05/11/20: several small hepatic cysts, moderate sized hiatal hernia, pancolonic diverticulosis, aortic atherosclerosis, low attenuation area in the central uterus  Transvaginal pelvic ultrasound 05/25/20: Markedly thickened endometrial complex, 1.5 cm intramural mass of the right uterine fundus, normal ovaries  Endometrial biopsy 06/01/20 was negative  D&C, hysteroscopy, myosure 10/03/20 revealed a uterine polyp but no malignancy.    Past Medical History:  Diagnosis Date   Ambulates with cane    Anemia    hx of yrs ago   Anemia due to acute blood loss 10/10/2020   ANEMIA, OTHER, UNSPECIFIED 10/02/2006   Pt taking iron supplement.  Last cbc showed normal hgb.  Will recheck today.  If normal can d/c iron supplement.      Anticoagulant-induced bleeding (HCC) 10/09/2020   Arm numbness 01/27/2013   Stoke/TIA work up on 6/25-6/26 does not show any evidence of stroke on MRI, carotid dopplers normal.  Would consider cervical myelopathy as next best potential diagnosis, and neck MRI may be useful next step   DJD (degenerative joint disease) of hip    Ganglion cyst 02/14/2013   GERD (gastroesophageal reflux disease)    h/o in the 1990s   Heart murmur    mild no cardiologist   Hyperlipidemia    Hypertension    sees Dr. 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  had retroperiotneal hemorrahge with IVC filter placement after recurrent PE/DVT   Recurrent pulmonary embolism [facility])    see pulmonologist Dr. Asencion Noble   Sleep apnea    sleep study 2017 no cpap given   VTE (venous thromboembolism) 08/24/2009   Has h/o DVT and PE.  Pt on lifelong anticoagulation, currently on xarelto.  Also has IVC filter.    Wears glasses for reading   Wears partial dentures upper    Past Surgical History:  Procedure Laterality Date   CARPAL TUNNEL RELEASE  09/02/2012   Procedure: CARPAL TUNNEL RELEASE;  Surgeon: Winfield Cunas, MD;  Location: [place] [facility];  Service: Neurosurgery;  Laterality: Right;  RIGHT carpal tunnel release   DILATION AND CURETTAGE OF UTERUS  02/21/2012   Procedure: DILATATION AND CURETTAGE;  Surgeon: Emily Filbert, MD;  Location: [place] [facility];  Service: Gynecology;  Laterality: N/A;   Femoral vein ligation  12/2002   [place] filter  11/03/2003   R Jugular   HYSTEROSCOPY WITH D & C N/A 10/03/2020   Procedure: DILATATION AND CURETTAGE /HYSTEROSCOPY WITH MYOSURE;  Surgeon: Mora Bellman, MD;  Location: [place];  Service: Gynecology;  Laterality: N/A;   SHOULDER SURGERY  Left 07/15/08   Dr Hal Morales   TOTAL HIP ARTHROPLASTY  R 05/2203, L 2010   TRANSTHORACIC ECHOCARDIOGRAM     EF 55-60%, RV mod dilated, mildly increased LV wall thickness   TUBAL LIGATION  1981      Current Outpatient Medications  Medication Sig Dispense Refill   amLODipine (NORVASC) 5 MG tablet TAKE 1 TABLET BY MOUTH EVERYDAY AT BEDTIME 90 tablet 1   ferrous sulfate 325 (65 FE) MG tablet Take 1 tablet (325 mg total) by mouth every other day. 90 tablet 1   PEG-KCl-NaCl-NaSulf-Na Asc-C (PLENVU) 140 g SOLR Take 1 kit by mouth as directed. 1 each 0   XARELTO 20 MG TABS tablet TAKE 1 TABLET BY MOUTH EVERY DAY 90 tablet 0   No current facility-administered medications for this visit.    Allergies as of 06/12/2021   (No Known Allergies)    Family  History  Problem Relation Age of Onset   Heart disease Mother    Heart attack Mother    Heart disease Father    Heart attack Father    Cancer Sister    Alcohol abuse Sister    Cancer Brother    Colon cancer Neg Hx    Colon polyps Neg Hx    Esophageal cancer Neg Hx    Rectal cancer Neg Hx    Stomach cancer Neg Hx      Physical Exam: 06/02/20: 208 pounds 06/12/21: 183 pounds General:   Alert,  well-nourished, pleasant and cooperative in NAD Head:  Normocephalic and atraumatic. Eyes:  Sclera clear, no icterus.   Conjunctiva pink. Ears:  Normal auditory acuity. Nose:  No deformity, discharge,  or lesions. Mouth:  No deformity or lesions.   Neck:  Supple; no masses or thyromegaly. Lungs:  Clear throughout to auscultation.   No wheezes. Heart:  Regular rate and rhythm; no murmurs. Abdomen:  Soft,nontender, nondistended, normal bowel sounds, no  rebound or guarding. No hepatosplenomegaly.   Rectal:  Deferred  Msk:  Symmetrical. No boney deformities LAD: No inguinal or umbilical LAD Extremities:  No clubbing or edema. Neurologic:  Alert and  oriented x4;  grossly nonfocal Skin:  Intact without significant lesions or rashes. Psych:  Alert and cooperative. Normal mood and affect.   Jeyren Danowski L. Tarri Glenn, MD, MPH 06/12/2021, 12:15 PM

## 2021-06-12 NOTE — Telephone Encounter (Signed)
[facility] Pre-operative Risk Assessment     Request for surgical clearance:     Endoscopy Procedure  What type of surgery is being performed?     Endoscopy/colonoscopy  When is this surgery scheduled?     07/17/2021  What type of clearance is required ?   Pharmacy  Are there any medications that need to be held prior to surgery and how long? Eliquis for 2 days  Practice name and name of physician performing surgery?      [facility]  What is your office phone and fax number?      Phone- [phone]  [phone]  Anesthesia type (None, local, MAC, general) ?       MAC

## 2021-06-12 NOTE — Telephone Encounter (Signed)
Xarelto managed by PCP, taken for pulmonary emobli. Will defer recommendation to PCP

## 2021-06-12 NOTE — Patient Instructions (Addendum)
I have recommend an upper endoscopy and colonoscopy to evaluate your weight loss.  Tips for colonoscopy:  - Stay well hydrated for 3-4 days prior to the exam. This reduces nausea and dehydration.  - To prevent skin/hemorrhoid irritation - prior to wiping, put A&Dointment or vaseline on the toilet paper. - Keep a towel or pad on the bed.  - Drink  64oz of clear liquids in the morning of prep day (prior to starting the prep) to be sure that there is enough fluid to flush the colon and stay hydrated!!!! This is in addition to the fluids required for preparation. - Use of a flavored hard candy, such as grape Anise Salvo, can counteract some of the flavor of the prep and may prevent some nausea.     We have sent the following medications to your pharmacy for you to pick up at your convenience  [place] for your colonoscopy:

## 2021-06-14 ENCOUNTER — Ambulatory Visit (INDEPENDENT_AMBULATORY_CARE_PROVIDER_SITE_OTHER): Payer: Medicare HMO | Admitting: Family Medicine

## 2021-06-14 ENCOUNTER — Encounter: Payer: Self-pay | Admitting: Family Medicine

## 2021-06-14 ENCOUNTER — Other Ambulatory Visit: Payer: Self-pay

## 2021-06-14 ENCOUNTER — Other Ambulatory Visit: Payer: Self-pay | Admitting: Family Medicine

## 2021-06-14 ENCOUNTER — Ambulatory Visit
Admission: RE | Admit: 2021-06-14 | Discharge: 2021-06-14 | Disposition: A | Discharge status: Home / Self Care | Payer: Medicare HMO | Source: Ambulatory Visit | Attending: Family Medicine | Admitting: Family Medicine

## 2021-06-14 VITALS — BP 160/70 | HR 70 | Temp 98.3°F | Ht 64.0 in | Wt 182.0 lb

## 2021-06-14 DIAGNOSIS — M79675 Pain in left toe(s): Secondary | ICD-10-CM | POA: Diagnosis not present

## 2021-06-14 DIAGNOSIS — Z7901 Long term (current) use of anticoagulants: Secondary | ICD-10-CM | POA: Diagnosis not present

## 2021-06-14 DIAGNOSIS — Z23 Encounter for immunization: Secondary | ICD-10-CM

## 2021-06-14 DIAGNOSIS — M7989 Other specified soft tissue disorders: Secondary | ICD-10-CM | POA: Diagnosis not present

## 2021-06-14 MED ORDER — SHINGRIX 50 MCG/0.5ML IM SUSR: 0.5000 mL | Freq: Once | INTRAMUSCULAR | 1 refills | Status: AC

## 2021-06-14 NOTE — Assessment & Plan Note (Signed)
Patient's pain and swelling of pinkie toe of left foot concerning for fracture. Because she was not able to get an x-ray last week, will order another x-ray and send to [place]. Will also follow up results of imaging for further management.

## 2021-06-14 NOTE — Patient Instructions (Addendum)
It was great to see you today!  Here's what we talked about:  Go to [facility] to get an x-ray of your toe, as there may be a fracture. After your x-ray, we will follow up  with you about the results. Can take shingles vaccine prescription to your pharmacy  Take care and seek immediate care sooner if you develop any concerns.  [name], [place]  Be well, Dr. Ardelia Mems

## 2021-06-14 NOTE — Assessment & Plan Note (Signed)
BP is elevated today to 160/70. Repeat BP was 158/73. Previous BP values have been more controlled. Given acute pain of the left pinkie toe, will reassess BP at next visit after treatment.

## 2021-06-14 NOTE — Progress Notes (Signed)
    SUBJECTIVE:   CHIEF COMPLAINT / HPI:   [name] (MRN: [id number]) is a 75 y.o. female with a history of HTN, DVT and PE, GERD, and HLD who presents for follow up of toe pain.  Left pinkie toe pain Patient states her left pinkie toe pain and swelling is still present since her last visit for the pain on 06/04/21. She does not recall any trauma to the area but came in initially because she felt the pain after putting her shoe on. She characterizes the pain as "sore" instead of sharp. She has not had any problems ambulating. She has not noticed any bruising or discoloration, but she feels her toe may be slightly more swollen today. She went to the urgent care to get an x-ray done after her last visit, and she feels she got it done, but she never heard from anyone about it. Her son is accompanying her today, but he was not with her during this last visit and is unsure if she actually received the x-ray.  OBJECTIVE:   BP (!) 160/70   Pulse 70   Temp 98.3 F (36.8 C)   Ht 5\' 4"  (1.626 m)   Wt 182 lb (82.6 kg)   SpO2 98%   BMI 31.24 kg/m    PHYSICAL EXAM  GEN: Well-developed, in NAD MSK: Mild erythema with moderate swelling and tenderness to palpation over left fifth toe diffusely. Full ROM appreciated EXT: Moves all extremities grossly equally    ASSESSMENT/PLAN:   Pain of toe of left foot Patient's pain and swelling of pinkie toe of left foot concerning for fracture. Because she was not able to get an x-ray last week, will order another x-ray and send to [place]. Will also follow up results of imaging for further management.  Hypertension BP is elevated today to 160/70. Repeat BP was 158/73. Previous BP values have been more controlled. Given acute pain of the left pinkie toe, will reassess BP at next visit after treatment.  Current use of long term anticoagulation No bleeding while on xarelto. Will continue. Has upcoming colonoscopy scheduled. Discussed  risks/benefits of interrupted anticoagulation with patient in order to allow colonoscopy procedure. Patient and son agreeable with holding xarelto x2 days prior to colonoscopy so procedure may be done safely.   Health maintenance Patient was given her flu and COVID-19 booster vaccines without incident. Printed shingles vaccine prescription given. She has a colonoscopy scheduled for December.  [place]  Patient seen along with [employer] student [facility]. I personally evaluated this patient along with the student, and verified all aspects of the history, physical exam, and medical decision making as documented by the student. I agree with the student's documentation and have made all necessary edits.  Chrisandra Netters, MD  [place]

## 2021-06-15 NOTE — Telephone Encounter (Signed)
It's fine to hold xarelto for two days prior to procedure. I discussed risks/benefits with patient during her visit this week and she is agreeable to proceed with holding anticoagulation.  Thanks Leeanne Rio, MD

## 2021-06-19 ENCOUNTER — Telehealth: Payer: Self-pay | Admitting: Family Medicine

## 2021-06-19 NOTE — Telephone Encounter (Signed)
Discussed results of foot Xray that showed a left foot, 5th digit, proximal phalanx base, non-displaced fracture with intra-articular extension.    Recommended continued buddy taping and wearing of hard-soled shoes.  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  Did not recommend orthopedic consultation given limited role of 5th MTP with stance and gait.    Would re-image if toe pain present at 4 to 6 weeks.

## 2021-06-22 NOTE — Assessment & Plan Note (Signed)
No bleeding while on xarelto. Will continue. Has upcoming colonoscopy scheduled. Discussed risks/benefits of interrupted anticoagulation with patient in order to allow colonoscopy procedure. Patient and son agreeable with holding xarelto x2 days prior to colonoscopy so procedure may be done safely.

## 2021-07-16 ENCOUNTER — Telehealth: Payer: Self-pay | Admitting: Gastroenterology

## 2021-07-16 NOTE — Telephone Encounter (Signed)
Hey Dr. Tarri Glenn,   Patient son Willa Rough called in to cancel patient EGD/Colon 12/13 due to a fall from patient and not wanting to go through with the procedure. Patient will talk to PCP about cologuard and is now eating better.   Thank you

## 2021-07-17 ENCOUNTER — Encounter: Payer: Medicare HMO | Admitting: Gastroenterology

## 2021-07-17 ENCOUNTER — Ambulatory Visit (INDEPENDENT_AMBULATORY_CARE_PROVIDER_SITE_OTHER): Payer: Medicare HMO | Admitting: Family Medicine

## 2021-07-17 ENCOUNTER — Ambulatory Visit
Admission: RE | Admit: 2021-07-17 | Discharge: 2021-07-17 | Disposition: A | Discharge status: Home / Self Care | Payer: Medicare HMO | Source: Ambulatory Visit | Attending: Family Medicine | Admitting: Family Medicine

## 2021-07-17 ENCOUNTER — Other Ambulatory Visit: Payer: Self-pay

## 2021-07-17 VITALS — BP 145/70 | HR 64 | Wt 178.8 lb

## 2021-07-17 DIAGNOSIS — M542 Cervicalgia: Secondary | ICD-10-CM | POA: Diagnosis not present

## 2021-07-17 DIAGNOSIS — M546 Pain in thoracic spine: Secondary | ICD-10-CM

## 2021-07-17 DIAGNOSIS — G8929 Other chronic pain: Secondary | ICD-10-CM

## 2021-07-17 DIAGNOSIS — M25512 Pain in left shoulder: Secondary | ICD-10-CM | POA: Diagnosis not present

## 2021-07-17 DIAGNOSIS — M4312 Spondylolisthesis, cervical region: Secondary | ICD-10-CM | POA: Diagnosis not present

## 2021-07-17 NOTE — Progress Notes (Signed)
° ° °  SUBJECTIVE:   CHIEF COMPLAINT / HPI:   Ms. [name] is a 75 yo who presents for L neck, shoulder, and back pain for the past 3 weeks.   Pain travels from mid back to L neck. Pain travels down L arm to elbow. Painful even just sitting down not worsened by movement. Does not keep her awake at night. Pain has been constant for the past 3 weeks. Not taking any medication. Currently Xerelto so avoiding certain pain medication. Son asks if it is ok if she takes Tylenol arthritis for pain. Does endorse headaches but they have occurred before the pain, nothing has changed. Son states that she sits in the recliner for 6-7 hours straight and has to turn her head a certain direction to see the TV, or she has her head down doing crossword puzzles.  OBJECTIVE:   BP (!) 145/70    Pulse 64    Wt 178 lb 12.8 oz (81.1 kg)    SpO2 97%    BMI 30.69 kg/m    Physical exam General: well appearing, NAD Cardiovascular: RRR, no murmurs Lungs: CTAB. Normal WOB Abdomen: soft, non-distended, non-tender Skin: warm, dry. No edema MSK: Thoracic and cervical region without erythema, edema, or skin changes. Increased hypertonicity of L sided paraspinal muscles from thoracic to cervical area. Tenderness to palpation from thoracic to cervical region.   ASSESSMENT/PLAN:   No problem-specific Assessment & Plan notes found for this encounter.   L sided neck, back and shoulder pain Patient presents with 3 weeks of pain from the right side of her mid back to her neck and right shoulder. On exam patient with paraspinal hypertonicity and tenderness from thoracic area up to about C7 without midline tenderness or skin changes. 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 Her chronic arthritis is also likely contributing. Discussed how doing exercises and physical therapy would be most beneficial to increase mobility and family open to starting. Placed referral.  Additionally, given XR findings in 2013 showing arthritis and spondylolisthesis will get another XR to assess progression which also could be contributing to her pain.  Discussed supportive care management and gave strict return precautions.  - PT referral placed - f/u C spine XR - Tylenol arthritis for pain    [place]

## 2021-07-17 NOTE — Patient Instructions (Signed)
It was great seeing you today!  Today you came in for neck and back pain.  I would like you to get an x-ray of your neck to check on the cervical disc changes that could be potentially contributing to your pain.  I have also placed a referral for physical therapy to help with exercises and increasing mobility, they should call you in the next few days to set up an appointment.  It is okay for you to take the Tylenol arthritis for your pain.  I will call you with the results from the X ray  Feel free to call with any questions or concerns at any time, at [phone].   Take care,  Dr. Shary Key [facility]

## 2021-07-20 ENCOUNTER — Telehealth: Payer: Self-pay

## 2021-07-20 NOTE — Telephone Encounter (Signed)
Patient calls nurse line requesting to speak with provider regarding results of X-ray. Patient reports that she continues to have neck pain.   Please advise.   Talbot Grumbling, RN

## 2021-07-23 NOTE — Telephone Encounter (Signed)
Spoke to patient about X ray results. She states she is feeling improvement after using the Tylenol. Discussed continuing the Tylenol as needed for her pain as well as physical therapy. Return precautions discussed

## 2021-08-09 NOTE — Therapy (Incomplete)
OUTPATIENT PHYSICAL THERAPY CERVICAL EVALUATION   Patient Name: [name] MRN: [id number] DOB:1945-10-20, 75 y.o., female Today's Date: 08/09/2021    Past Medical History:  Diagnosis Date   Ambulates with cane    Anemia    hx of yrs ago   Anemia due to acute blood loss 10/10/2020   ANEMIA, OTHER, UNSPECIFIED 10/02/2006   Pt taking iron supplement.  Last cbc showed normal hgb.  Will recheck today.  If normal can d/c iron supplement.      Anticoagulant-induced bleeding ([place]) 10/09/2020   Arm numbness 01/27/2013   Stoke/TIA work up on 6/25-6/26 does not show any evidence of stroke on MRI, carotid dopplers normal.  Would consider cervical myelopathy as next best potential diagnosis, and neck MRI may be useful next step   DJD (degenerative joint disease) of hip    Ganglion cyst 02/14/2013   GERD (gastroesophageal reflux disease)    h/o in the 1990s   Heart murmur    mild no cardiologist   Hyperlipidemia    Hypertension    sees Dr. 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  had retroperiotneal hemorrahge with IVC filter placement after recurrent PE/DVT   Recurrent pulmonary embolism [facility])    see pulmonologist Dr. Asencion Noble   Sleep apnea    sleep study 2017 no cpap given   VTE (venous thromboembolism) 08/24/2009   Has h/o DVT and PE.  Pt on lifelong anticoagulation, currently on xarelto.  Also has IVC filter.    Wears glasses for reading   Wears partial dentures upper   Past Surgical History:  Procedure Laterality Date   CARPAL TUNNEL RELEASE  09/02/2012   Procedure: CARPAL  TUNNEL RELEASE;  Surgeon: Winfield Cunas, MD;  Location: [place] [facility];  Service: Neurosurgery;  Laterality: Right;  RIGHT carpal tunnel release   DILATION AND CURETTAGE OF UTERUS  02/21/2012   Procedure: DILATATION AND CURETTAGE;  Surgeon: Emily Filbert, MD;  Location: [place] [facility];  Service: Gynecology;  Laterality: N/A;   Femoral vein ligation  12/2002   Greenfield filter  11/03/2003   R Jugular   HYSTEROSCOPY WITH D & C N/A 10/03/2020   Procedure: DILATATION AND CURETTAGE /HYSTEROSCOPY WITH MYOSURE;  Surgeon: Mora Bellman, MD;  Location: [place];  Service: Gynecology;  Laterality: N/A;   SHOULDER SURGERY  Left 07/15/08   Dr Hal Morales   TOTAL HIP ARTHROPLASTY  R 05/2203, L 2010   TRANSTHORACIC ECHOCARDIOGRAM     EF 55-60%, RV mod dilated, mildly increased LV wall thickness   TUBAL LIGATION  1981   Patient Active Problem List   Diagnosis Date Noted   Pain of toe of left foot 06/05/2021   Obesity (BMI 30-39.9) 04/13/2021   Ambulates with cane 04/11/2021   Heart murmur 04/11/2021   Memory impairment 04/11/2021   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 ([place]) 04/11/2021   Recurrent pulmonary embolism ([place]) 04/11/2021   Sleep apnea 04/11/2021   Wears glasses 04/11/2021   Wears partial dentures 04/11/2021   Hordeolum externum (stye) 04/10/2021   Syncope and  collapse 03/11/2021   Thickened endometrium 05/31/2020   Unintentional weight loss 05/07/2020   Venous stasis dermatitis 12/21/2014   GERD (gastroesophageal reflux disease) 01/13/2014   Osteoarthritis of both knees 04/22/2013   Current use of long term anticoagulation 09/19/2010   OSTEOARTHRITIS, HIP, LEFT 04/07/2008   CHRONIC KIDNEY DISEASE STAGE III (MODERATE) 03/25/2007   Hyperlipidemia 10/02/2006   Hypertension 10/02/2006    PCP: Leeanne Rio, MD  REFERRING PROVIDER: Zenia Resides, MD  REFERRING DIAG: M54.2 (ICD-10-CM) - Neck pain on left side  THERAPY DIAG:  No diagnosis found.  ONSET  DATE: ***  SUBJECTIVE:                                                                                                                                                                                                         SUBJECTIVE STATEMENT: ***  PERTINENT HISTORY:  ***  PAIN:  Are you having pain? {yes/no:20286} VAS scale: ***/10 Pain location: *** Pain orientation: {Pain Orientation:25161}  PAIN TYPE: {type:313116} Pain description: {PAIN DESCRIPTION:21022940}  Aggravating factors: *** Relieving factors: ***  PRECAUTIONS: {Therapy precautions:24002}  WEIGHT BEARING RESTRICTIONS No  FALLS:  Has patient fallen in last 6 months? {yes/no:20286} Number of falls: ***  LIVING ENVIRONMENT: Lives with: {[facility] lives with:25569::"lives with their family"} Lives in: {Lives in:25570} Stairs: {yes/no:20286}; {Stairs:24000} Has following equipment at home: {Assistive devices:23999}  OCCUPATION: ***  PLOF: {PLOF:24004}  PATIENT GOALS ***  OBJECTIVE:   DIAGNOSTIC FINDINGS: 07-18-21 IMPRESSION: 1. No acute findings in the cervical spine. 2. 1 mm anterolisthesis of C2 on C3, with stable 3 mm anterolisthesis of C4 on C5. 3. Multilevel degenerative disc disease, most prominent at C5-C6 and C6-C7.  PATIENT SURVEYS:  FOTO ***   COGNITION: Overall cognitive status: {cognition:24006}   SENSATION: Light touch: {intact/deficits:24005} Hot/Cold: {intact/deficits:24005} Proprioception: {intact/deficits:24005}  POSTURE:  ***  PALPATION: ***   CERVICAL AROM/PROM  A/PROM A/PROM (deg) 08/09/2021  Flexion   Extension   Right lateral flexion   Left lateral flexion   Right rotation   Left rotation    (Blank rows = not tested)  UE AROM/PROM:  A/PROM Right 08/09/2021 Left 08/09/2021  Shoulder flexion    Shoulder extension    Shoulder abduction    Shoulder adduction    Shoulder extension    Shoulder internal rotation    Shoulder external rotation    Elbow flexion     Elbow extension    Wrist flexion    Wrist extension    Wrist ulnar deviation    Wrist radial deviation    Wrist  pronation    Wrist supination     (Blank rows = not tested)  UE MMT:  MMT Right 08/09/2021 Left 08/09/2021  Shoulder flexion    Shoulder extension    Shoulder abduction    Shoulder adduction    Shoulder extension    Middle trapezius    Lower trapezius    Elbow flexion    Elbow extension    Wrist flexion    Wrist extension    Wrist ulnar deviation    Wrist radial deviation    Wrist pronation    Wrist supination    Grip strength     (Blank rows = not tested)  CERVICAL SPECIAL TESTS:  {Cervical special tests:25246}   FUNCTIONAL TESTS:  {Functional tests:24029}    TODAY'S TREATMENT:  ***   PATIENT EDUCATION:  Education details: *** Person educated: {Person educated:25204} Education method: {Education Method:25205} Education comprehension: {Education Comprehension:25206}   HOME EXERCISE PROGRAM: ***  ASSESSMENT:  CLINICAL IMPRESSION: Patient is a *** y.o. *** who was seen today for physical therapy evaluation and treatment for ***. Objective impairments include {opptimpairments:25111}. These impairments are limiting patient from {activity limitations:25113}. Personal factors including {Personal factors:25162} are also affecting patient's functional outcome. Patient will benefit from skilled PT to address above impairments and improve overall function.  REHAB POTENTIAL: {rehabpotential:25112}  CLINICAL DECISION MAKING: {clinical decision making:25114}  EVALUATION COMPLEXITY: {Evaluation complexity:25115}   GOALS: Goals reviewed with patient? {yes/no:20286}  SHORT TERM GOALS:  STG Name Target Date Goal status  1 *** Baseline:  {follow up:25551} {GOALSTATUS:25110}  2 *** Baseline:  {follow up:25551} {GOALSTATUS:25110}  3 *** Baseline: {follow up:25551} {GOALSTATUS:25110}  4 *** Baseline: {follow up:25551} {GOALSTATUS:25110}  5  *** Baseline: {follow up:25551} {GOALSTATUS:25110}  6 *** Baseline: {follow up:25551} {GOALSTATUS:25110}  7 *** Baseline: {follow up:25551} {GOALSTATUS:25110}   LONG TERM GOALS:   LTG Name Target Date Goal status  1 *** Baseline: {follow up:25551} {GOALSTATUS:25110}  2 *** Baseline: {follow up:25551} {GOALSTATUS:25110}  3 *** Baseline: {follow up:25551} {GOALSTATUS:25110}  4 *** Baseline: {follow up:25551} {GOALSTATUS:25110}  5 *** Baseline: {follow up:25551} {GOALSTATUS:25110}  6 *** Baseline: {follow up:25551} {GOALSTATUS:25110}  7 *** Baseline: {follow up:25551} {GOALSTATUS:25110}   PLAN: PT FREQUENCY: {rehab frequency:25116}  PT DURATION: {rehab duration:25117}  PLANNED INTERVENTIONS: {rehab planned interventions:25118::"Therapeutic exercises","Therapeutic activity","Neuro Muscular re-education","Balance training","Gait training","Patient/Family education","Joint mobilization"}  PLAN FOR NEXT SESSION: ***   Dorothea Ogle 08/09/2021, 10:55 PM

## 2021-08-14 ENCOUNTER — Ambulatory Visit: Payer: Medicare HMO | Attending: Family Medicine | Admitting: Physical Therapy

## 2021-08-20 ENCOUNTER — Other Ambulatory Visit: Payer: Self-pay | Admitting: Family Medicine

## 2021-09-17 ENCOUNTER — Ambulatory Visit
Admission: RE | Admit: 2021-09-17 | Discharge: 2021-09-17 | Disposition: A | Discharge status: Home / Self Care | Payer: Medicare HMO | Source: Ambulatory Visit | Attending: Surgery | Admitting: Surgery

## 2021-09-17 DIAGNOSIS — D241 Benign neoplasm of right breast: Secondary | ICD-10-CM

## 2021-09-17 DIAGNOSIS — R922 Inconclusive mammogram: Secondary | ICD-10-CM | POA: Diagnosis not present

## 2021-10-11 ENCOUNTER — Ambulatory Visit: Payer: Medicare HMO | Admitting: Cardiology

## 2021-10-29 ENCOUNTER — Ambulatory Visit: Payer: Medicare HMO | Admitting: Cardiology

## 2021-10-31 ENCOUNTER — Ambulatory Visit: Payer: Medicare HMO | Admitting: Family Medicine

## 2021-10-31 ENCOUNTER — Encounter: Payer: Self-pay | Admitting: Cardiology

## 2021-10-31 ENCOUNTER — Ambulatory Visit: Payer: Medicare HMO | Admitting: Cardiology

## 2021-10-31 ENCOUNTER — Other Ambulatory Visit: Payer: Self-pay

## 2021-10-31 VITALS — BP 122/70 | HR 96 | Ht 64.0 in | Wt 175.8 lb

## 2021-10-31 DIAGNOSIS — I10 Essential (primary) hypertension: Secondary | ICD-10-CM

## 2021-10-31 DIAGNOSIS — G4739 Other sleep apnea: Secondary | ICD-10-CM

## 2021-10-31 NOTE — Patient Instructions (Signed)

## 2021-10-31 NOTE — Progress Notes (Signed)
?Cardiology Office Note:   ? ?Date:  11/01/2021  ? ?ID:  [name], DOB Feb 09, 1946, MRN [id number] ? ?PCP:  Leeanne Rio, MD  ?Cardiologist:  Berniece Salines, DO  ?Electrophysiologist:  None  ? ?Referring MD: Leeanne Rio, MD  ? ? ? ?History of Present Illness:   ? ?[name] is a 76 y.o. female with a hx of hypertension, hyperlipidemia, obesity here today for follow-up visit.  The patient is here today with her son.  Since I saw her she has not had any episodes of syncope.  She is doing very well. ? ? ?Past Medical History:  ?Diagnosis Date  ? Ambulates with cane   ? Anemia   ? hx of yrs ago  ? Anemia due to acute blood loss 10/10/2020  ? ANEMIA, OTHER, UNSPECIFIED 10/02/2006  ? Pt taking iron supplement.  Last cbc showed normal hgb.  Will recheck today.  If normal can d/c iron supplement.     ? Anticoagulant-induced bleeding ([place]) 10/09/2020  ? Arm numbness 01/27/2013  ? [place]/TIA work up on 6/25-6/26 does not show any evidence of stroke on MRI, carotid dopplers normal.  Would consider cervical myelopathy as next best potential diagnosis, and neck MRI may be useful next step  ? DJD (degenerative joint disease) of hip   ? Ganglion cyst 02/14/2013  ? GERD (gastroesophageal reflux disease)   ? h/o in the 1990s  ? Heart murmur   ? mild no cardiologist  ? Hyperlipidemia   ? Hypertension   ? sees Dr. Gilda Crease  ? Memory impairment   ? gets confused about medications history etc speak with son [name] cell [phone]  ? Numbness of fingers 11/15/2011  ? Obesity (BMI 30-39.9)   ? Pneumonia 10/2018  ? Postmenopausal bleeding 09/04/2011  ? Status post d&c 7/13 with normal pathology  ? Radiculopathy affecting upper extremity 07/23/2012  ? 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 4x, hypercoagulable work up negative 2005.  had retroperiotneal hemorrahge with IVC filter placement after recurrent PE/DVT  ? Recurrent pulmonary embolism ([place])   ? see pulmonologist Dr. Asencion Noble  ?  Sleep apnea   ? sleep study 2017 no cpap given  ? VTE (venous thromboembolism) 08/24/2009  ? Has h/o DVT and PE.  Pt on lifelong anticoagulation, currently on xarelto.  Also has IVC filter.   ? Wears glasses for reading  ? Wears partial dentures upper  ? ? ?Past Surgical History:  ?Procedure Laterality Date  ? BREAST BIOPSY Right 10/17/2020  ? ductal papilloma  ? CARPAL TUNNEL RELEASE  09/02/2012  ? Procedure: CARPAL TUNNEL RELEASE;  Surgeon: Winfield Cunas, MD;  Location: [place] [facility];  Service: Neurosurgery;  Laterality: Right;  RIGHT carpal tunnel release  ? DILATION AND CURETTAGE OF UTERUS  02/21/2012  ? Procedure: DILATATION AND CURETTAGE;  Surgeon: Emily Filbert, MD;  Location: [place] [facility];  Service: Gynecology;  Laterality: N/A;  ? Femoral vein ligation  12/2002  ? Greenfield filter  11/03/2003  ? R Jugular  ? HYSTEROSCOPY WITH D & C N/A 10/03/2020  ? Procedure: DILATATION AND CURETTAGE /HYSTEROSCOPY WITH MYOSURE;  Surgeon: Mora Bellman, MD;  Location: [place];  Service: Gynecology;  Laterality: N/A;  ? SHOULDER SURGERY  Left 07/15/08  ? Dr Hal Morales  ? TOTAL HIP ARTHROPLASTY  R 05/2203, L 2010  ? TRANSTHORACIC ECHOCARDIOGRAM    ? EF 55-60%, RV mod dilated, mildly increased LV wall  thickness  ? TUBAL LIGATION  1981  ? ? ?Current Medications: ?Current Meds  ?Medication Sig  ? amLODipine (NORVASC) 5 MG tablet TAKE 1 TABLET BY MOUTH EVERYDAY AT BEDTIME  ? ferrous sulfate 325 (65 FE) MG tablet Take 1 tablet (325 mg total) by mouth every other day.  ? XARELTO 20 MG TABS tablet TAKE 1 TABLET BY MOUTH EVERY DAY  ?  ? ?Allergies:   Patient has no known allergies.  ? ?Social History  ? ?Socioeconomic History  ? Marital status: Widowed  ?  Spouse name: Not on file  ? Number of children: 2  ? Years of education: College  ? Highest education level: Associate degree: occupational, Hotel manager, or vocational program  ?Occupational History  ?  Employer: RETIRED  ?Tobacco Use  ? Smoking status: Never  ?  Passive  exposure: Never  ? Smokeless tobacco: Never  ?Vaping Use  ? Vaping Use: Never used  ?Substance and Sexual Activity  ? Alcohol use: No  ? Drug use: No  ? Sexual activity: Not Currently  ?  Birth control/protection: Post-menopausal  ?Other Topics Concern  ? Not on file  ?Social History Narrative  ? Patient lives with her son and adopted daughter with special needs.   ? Patient is active in her church and with her family.  ? Patient enjoys sewing, crocheting, cooking, and spending time with family.   ? Patient has 2 biological sons and 2 adopted daughters.   ?   ?   ? ?Social Determinants of Health  ? ?Financial Resource Strain: Not on file  ?Food Insecurity: Not on file  ?Transportation Needs: Not on file  ?Physical Activity: Not on file  ?Stress: Not on file  ?Social Connections: Not on file  ?  ? ?Family History: ?The patient's family history includes Alcohol abuse in her sister; Cancer in her brother and sister; Heart attack in her father and mother; Heart disease in her father and mother. There is no history of Colon cancer, Colon polyps, Esophageal cancer, Rectal cancer, or Stomach cancer. ? ?ROS:   ?Review of Systems  ?Constitution: Negative for decreased appetite, fever and weight gain.  ?HENT: Negative for congestion, ear discharge, hoarse voice and sore throat.   ?Eyes: Negative for discharge, redness, vision loss in right eye and visual halos.  ?Cardiovascular: Negative for chest pain, dyspnea on exertion, leg swelling, orthopnea and palpitations.  ?Respiratory: Negative for cough, hemoptysis, shortness of breath and snoring.   ?Endocrine: Negative for heat intolerance and polyphagia.  ?Hematologic/Lymphatic: Negative for bleeding problem. Does not bruise/bleed easily.  ?Skin: Negative for flushing, nail changes, rash and suspicious lesions.  ?Musculoskeletal: Negative for arthritis, joint pain, muscle cramps, myalgias, neck pain and stiffness.  ?Gastrointestinal: Negative for abdominal pain, bowel  incontinence, diarrhea and excessive appetite.  ?Genitourinary: Negative for decreased libido, genital sores and incomplete emptying.  ?Neurological: Negative for brief paralysis, focal weakness, headaches and loss of balance.  ?Psychiatric/Behavioral: Negative for altered mental status, depression and suicidal ideas.  ?Allergic/Immunologic: Negative for HIV exposure and persistent infections.  ? ? ?EKGs/Labs/Other Studies Reviewed:   ? ?The following studies were reviewed today: ? ? ?EKG: None today ? ?ZIO monitor May 04, 2021 ?Patch Wear Time:  14 days and 0 hours starting 04/12/2021. ?Indication: Palpitations ?  ?Patient had a minimum HR of 53 bpm, maximum HR of 155 bpm, and average HR of 75 bpm.  ?  ?Predominant underlying rhythm was Sinus Rhythm.  ?  ?Premature atrial complexes were rare (<1.0%). ?  Premature ventricular were rare (<1.0%). ?  ?Symptoms were associated with sinus rhythm and sinus tachycardia. ?  ?Conclusion: Unremarkable study with no evidence of significant arrhythmia ? ? ?TTE 03/11/2021 ?IMPRESSIONS  ? ? ? 1. Left ventricular ejection fraction, by estimation, is 65 to 70%. The  ?left ventricle has normal function. Left ventricular endocardial border  ?not optimally defined to evaluate regional wall motion. There is mild left  ?ventricular hypertrophy. Left  ?ventricular diastolic parameters are consistent with Grade I diastolic  ?dysfunction (impaired relaxation).  ? 2. Right ventricular systolic function is normal. The right ventricular  ?size is normal. Tricuspid regurgitation signal is inadequate for assessing  ?PA pressure.  ? 3. The mitral valve is grossly normal. Trivial mitral valve  ?regurgitation. No evidence of mitral stenosis.  ? 4. The aortic valve is grossly normal. Aortic valve regurgitation is not  ?visualized. No aortic stenosis is present.  ? 5. The inferior vena cava is normal in size with greater than 50%  ?respiratory variability, suggesting right atrial pressure of 3 mmHg.   ? ?FINDINGS  ? Left Ventricle: Left ventricular ejection fraction, by estimation, is 65  ?to 70%. The left ventricle has normal function. Left ventricular  ?endocardial border not optimally defined to evaluate r

## 2021-11-01 ENCOUNTER — Ambulatory Visit (INDEPENDENT_AMBULATORY_CARE_PROVIDER_SITE_OTHER): Payer: Medicare HMO | Admitting: Family Medicine

## 2021-11-01 ENCOUNTER — Other Ambulatory Visit: Payer: Self-pay

## 2021-11-01 VITALS — BP 152/79 | HR 96 | Wt 172.6 lb

## 2021-11-01 DIAGNOSIS — M25559 Pain in unspecified hip: Secondary | ICD-10-CM

## 2021-11-01 DIAGNOSIS — R634 Abnormal weight loss: Secondary | ICD-10-CM

## 2021-11-01 DIAGNOSIS — M25562 Pain in left knee: Secondary | ICD-10-CM

## 2021-11-01 DIAGNOSIS — I10 Essential (primary) hypertension: Secondary | ICD-10-CM

## 2021-11-01 DIAGNOSIS — D509 Iron deficiency anemia, unspecified: Secondary | ICD-10-CM

## 2021-11-01 DIAGNOSIS — M25561 Pain in right knee: Secondary | ICD-10-CM

## 2021-11-01 DIAGNOSIS — M25569 Pain in unspecified knee: Secondary | ICD-10-CM | POA: Insufficient documentation

## 2021-11-01 NOTE — Assessment & Plan Note (Signed)
Elevated today and increase on recheck. Normal yesterday at 122/70 in cardiology office. Asymptomatic except for mild headache but reports she has not eaten today.  ?-Continue Amlodipine 5 mg daily  ?-BMP ?-Encouraged home BP cuff and monitoring  ?-Instructed to call to schedule an appointment with our pharmacy team for ambulatory blood pressure monitoring if blood pressures consistently above 140/90 ?

## 2021-11-01 NOTE — Assessment & Plan Note (Signed)
She has a history of osteoarthritis of both knees.  Her history is more consistent with a flare of her osteoarthritis given is worsened with weather changes and improves with Tylenol.  On examination she did have some decreased joint space and some tenderness to her medial joint line in her left knee.  There were no effusions nor was there any edema.  Given her history of DVT and PE, I did consider this as a cause, however she does not have any edema and has been compliant with her Xarelto. Also would not expect this to be bilateral or worsening with weather changes. Additionally, no SOB. Considered fracture but no fall or injury. She is able to walk, though her gait is somewhat atalgic- she uses a cane for support.  ?-X-ray of hip and knee b/l ?-Continue Tylenol 650 mg, can take q4h as needed for pain ?-Instructed to use Voltaren gel as needed for pain ?-Strict return precautions provided ?

## 2021-11-01 NOTE — Progress Notes (Signed)
? ? ?SUBJECTIVE:  ? ?CHIEF COMPLAINT / HPI:  ? ?Bilateral Leg Pain ?[name] is a 76 y.o. female who presents to the [facility] clinic today for concerns of bilateral leg and hip pain starting several weeks ago. Notably, she has a history recurrent PE x3, DVT and is on long-term anticoagulation with Xarelto.  She reports good compliance with medication without any doses. Denies SOB and states this does not feel like a previous DVT, "I could feel it moving when I had the clot".  Notes that the achiness is worse with weather changes, improves with a dose of 650 mg of Tylenol.  She is accompanied by her son today who states that they saw the cardiologist yesterday and received a good report, was told to follow-up in 1 year. She has not had any falls. She uses a cane to walk daily. Her son notes that recently she has been sitting on the couch to watch TV and he thinks that may have caused her to have some tailbone pain.  ? ?Anemia  Weight Loss  ?Has history of reported unintentional weight loss that was being worked up by PCP. Also history of anemia. She was scheduled for a colonoscopy in December 2022 however canceled that due to a fall.  She was interested in proceeding with Cologuard. Has had no previous colonoscopy. Last Hgb in 04/2021 was 10.8. Previous CT abd/pelv in 2021 noted several small hepatic cysts and moderate size hiatal hernia, pancolonic diverticulosis, aortic atherosclerosis and low attenuation area in central uterus. She had a pelvic U/S with markedly thickened endometrial complex and 1.5 cm intramural mass but endometrial biopsy negative in 05/2020 and D&C, hysteroscopy, myosure in 10/2020 revealed no malignancy. ? ?Hypertension ?States that her blood pressure was normal at the cardiologist office yesterday.  She is on amlodipine 5 mg daily reports daily compliance, however she has not taken it today as she normally takes it at 4 PM every day. Denies any chest pain, SOB. Does endorse a slight  headache but also states she has not eaten anything today.  ? ?PERTINENT  PMH / PSH: PE, DVT, HTN, osteoarthritis ? ?OBJECTIVE:  ? ?BP (!) 152/79   Pulse 96   Wt 172 lb 9.6 oz (78.3 kg)   SpO2 100%   BMI 29.63 kg/m?   ? ?General: NAD, pleasant, able to participate in exam ?Respiratory: Normal respiratory effort, no distress ?MSK: Gait semi atalgic with cane. No tenderness to lumbar spine. Decreased joint space to b/l knees with tenderness to palpation of joint space in medial left knee. No knee effusions. Normal extension and flexion of knee. No bakers cyst.  ?Extremities: no edema or cyanosis to bilateral legs, 2+ DP pulses b/l ?Skin: warm and dry, no rashes noted ?Psych: Normal affect and mood ? ?ASSESSMENT/PLAN:  ? ?Knee pain ?She has a history of osteoarthritis of both knees.  Her history is more consistent with a flare of her osteoarthritis given is worsened with weather changes and improves with Tylenol.  On examination she did have some decreased joint space and some tenderness to her medial joint line in her left knee.  There were no effusions nor was there any edema.  Given her history of DVT and PE, I did consider this as a cause, however she does not have any edema and has been compliant with her Xarelto. Also would not expect this to be bilateral or worsening with weather changes. Additionally, no SOB. Considered fracture but no fall or injury. She is  able to walk, though her gait is somewhat atalgic- she uses a cane for support.  ?-X-ray of hip and knee b/l ?-Continue Tylenol 650 mg, can take q4h as needed for pain ?-Instructed to use Voltaren gel as needed for pain ?-Strict return precautions provided ? ?Hypertension ?Elevated today and increase on recheck. Normal yesterday at 122/70 in cardiology office. Asymptomatic except for mild headache but reports she has not eaten today.  ?-Continue Amlodipine 5 mg daily  ?-BMP ?-Encouraged home BP cuff and monitoring  ?-Instructed to call to schedule an  appointment with our pharmacy team for ambulatory blood pressure monitoring if blood pressures consistently above 140/90 ? ?Unintentional weight loss ?Has lost approximately 20 pounds since last March.  Additionally, she has not history of anemia for which she takes iron supplements.  I am unable to find previous colonoscopy reports to review though I saw that she did cancel her colonoscopy this past December. Discussed the importance of colonoscopy for evaluating for more serious pathology as underlying reason for anemia and weight loss. Patient and son seemed amenable to proceeding with this.  ?-Instructed to call GI office (state they have the number) to reschedule  ?-CBC today  ?  ? ? ?Sharion Settler, DO ?[place]  ? ?

## 2021-11-01 NOTE — Patient Instructions (Addendum)
It was wonderful to see you today. ? ?Please bring ALL of your medications with you to every visit.  ? ?Today we talked about: ? ?-We are doing lab work today to check on her blood counts and her electrolytes ?-I have ordered x-rays for her knees and her hips.  You can walk over to the hospital to get these done, no appointment is required ?-You can continue to use Tylenol 650 mg every 4 hours as needed for pain. You can also buy Voltaren gel over the counter and apply this to areas of pain.  ?-If you develop shortness of breath, swelling of your legs, or if the pain worsens, please return and seek immediate evaluation. ?-Your blood pressure was elevated today. Continue to monitor at home. If your numbers are consistently >140/90 I would encourage you to call to schedule an appointment with our pharmacist for 24 hour blood pressure monitoring. ?-I would encourage you to call the GI center to reschedule the colonoscopy.  ? ?Thank you for choosing [facility].  ? ?Please call [phone] with any questions about today's appointment. ? ?Please be sure to schedule follow up at the front  desk before you leave today.  ? ?Sharion Settler, DO ?PGY-2 Family Medicine   ?

## 2021-11-01 NOTE — Assessment & Plan Note (Signed)
Has lost approximately 20 pounds since last March.  Additionally, she has not history of anemia for which she takes iron supplements.  I am unable to find previous colonoscopy reports to review though I saw that she did cancel her colonoscopy this past December. Discussed the importance of colonoscopy for evaluating for more serious pathology as underlying reason for anemia and weight loss. Patient and son seemed amenable to proceeding with this.  ?-Instructed to call GI office (state they have the number) to reschedule  ?-CBC today  ?

## 2021-11-02 ENCOUNTER — Emergency Department (HOSPITAL_COMMUNITY): Payer: Medicare HMO

## 2021-11-02 ENCOUNTER — Emergency Department (HOSPITAL_BASED_OUTPATIENT_CLINIC_OR_DEPARTMENT_OTHER): Payer: Medicare HMO

## 2021-11-02 ENCOUNTER — Other Ambulatory Visit: Payer: Self-pay

## 2021-11-02 ENCOUNTER — Encounter (HOSPITAL_COMMUNITY): Payer: Self-pay | Admitting: Emergency Medicine

## 2021-11-02 ENCOUNTER — Observation Stay (HOSPITAL_COMMUNITY)
Admission: EM | Admit: 2021-11-02 | Discharge: 2021-11-04 | Disposition: A | Discharge status: Home / Self Care | Payer: Medicare HMO | Attending: Family Medicine | Admitting: Family Medicine

## 2021-11-02 DIAGNOSIS — E785 Hyperlipidemia, unspecified: Secondary | ICD-10-CM | POA: Diagnosis not present

## 2021-11-02 DIAGNOSIS — R55 Syncope and collapse: Secondary | ICD-10-CM | POA: Insufficient documentation

## 2021-11-02 DIAGNOSIS — Z96643 Presence of artificial hip joint, bilateral: Secondary | ICD-10-CM | POA: Insufficient documentation

## 2021-11-02 DIAGNOSIS — Y939 Activity, unspecified: Secondary | ICD-10-CM | POA: Insufficient documentation

## 2021-11-02 DIAGNOSIS — Z604 Social exclusion and rejection: Secondary | ICD-10-CM | POA: Insufficient documentation

## 2021-11-02 DIAGNOSIS — M79605 Pain in left leg: Secondary | ICD-10-CM | POA: Diagnosis not present

## 2021-11-02 DIAGNOSIS — Z636 Dependent relative needing care at home: Secondary | ICD-10-CM | POA: Insufficient documentation

## 2021-11-02 DIAGNOSIS — I82411 Acute embolism and thrombosis of right femoral vein: Secondary | ICD-10-CM | POA: Insufficient documentation

## 2021-11-02 DIAGNOSIS — I959 Hypotension, unspecified: Secondary | ICD-10-CM | POA: Diagnosis not present

## 2021-11-02 DIAGNOSIS — Z8249 Family history of ischemic heart disease and other diseases of the circulatory system: Secondary | ICD-10-CM | POA: Insufficient documentation

## 2021-11-02 DIAGNOSIS — I491 Atrial premature depolarization: Secondary | ICD-10-CM | POA: Insufficient documentation

## 2021-11-02 DIAGNOSIS — K219 Gastro-esophageal reflux disease without esophagitis: Secondary | ICD-10-CM | POA: Diagnosis not present

## 2021-11-02 DIAGNOSIS — R918 Other nonspecific abnormal finding of lung field: Secondary | ICD-10-CM | POA: Insufficient documentation

## 2021-11-02 DIAGNOSIS — S0990XA Unspecified injury of head, initial encounter: Secondary | ICD-10-CM | POA: Insufficient documentation

## 2021-11-02 DIAGNOSIS — N183 Chronic kidney disease, stage 3 unspecified: Secondary | ICD-10-CM | POA: Diagnosis not present

## 2021-11-02 DIAGNOSIS — Z79899 Other long term (current) drug therapy: Secondary | ICD-10-CM | POA: Insufficient documentation

## 2021-11-02 DIAGNOSIS — Z86718 Personal history of other venous thrombosis and embolism: Secondary | ICD-10-CM | POA: Insufficient documentation

## 2021-11-02 DIAGNOSIS — J3489 Other specified disorders of nose and nasal sinuses: Secondary | ICD-10-CM | POA: Insufficient documentation

## 2021-11-02 DIAGNOSIS — I129 Hypertensive chronic kidney disease with stage 1 through stage 4 chronic kidney disease, or unspecified chronic kidney disease: Secondary | ICD-10-CM | POA: Insufficient documentation

## 2021-11-02 DIAGNOSIS — I6782 Cerebral ischemia: Secondary | ICD-10-CM | POA: Insufficient documentation

## 2021-11-02 DIAGNOSIS — R61 Generalized hyperhidrosis: Secondary | ICD-10-CM | POA: Diagnosis not present

## 2021-11-02 DIAGNOSIS — M79604 Pain in right leg: Secondary | ICD-10-CM

## 2021-11-02 DIAGNOSIS — R2681 Unsteadiness on feet: Secondary | ICD-10-CM | POA: Insufficient documentation

## 2021-11-02 DIAGNOSIS — R109 Unspecified abdominal pain: Secondary | ICD-10-CM | POA: Diagnosis not present

## 2021-11-02 DIAGNOSIS — F32A Depression, unspecified: Secondary | ICD-10-CM | POA: Diagnosis not present

## 2021-11-02 DIAGNOSIS — Z20822 Contact with and (suspected) exposure to covid-19: Secondary | ICD-10-CM | POA: Diagnosis not present

## 2021-11-02 DIAGNOSIS — G4730 Sleep apnea, unspecified: Secondary | ICD-10-CM | POA: Insufficient documentation

## 2021-11-02 DIAGNOSIS — R110 Nausea: Secondary | ICD-10-CM | POA: Insufficient documentation

## 2021-11-02 DIAGNOSIS — E860 Dehydration: Secondary | ICD-10-CM | POA: Insufficient documentation

## 2021-11-02 DIAGNOSIS — R0902 Hypoxemia: Secondary | ICD-10-CM | POA: Insufficient documentation

## 2021-11-02 DIAGNOSIS — M170 Bilateral primary osteoarthritis of knee: Secondary | ICD-10-CM | POA: Insufficient documentation

## 2021-11-02 DIAGNOSIS — Z86711 Personal history of pulmonary embolism: Secondary | ICD-10-CM | POA: Diagnosis not present

## 2021-11-02 DIAGNOSIS — I700 Atherosclerosis of aorta: Secondary | ICD-10-CM | POA: Insufficient documentation

## 2021-11-02 DIAGNOSIS — Z7901 Long term (current) use of anticoagulants: Secondary | ICD-10-CM | POA: Diagnosis not present

## 2021-11-02 DIAGNOSIS — R5383 Other fatigue: Secondary | ICD-10-CM | POA: Insufficient documentation

## 2021-11-02 DIAGNOSIS — R4189 Other symptoms and signs involving cognitive functions and awareness: Principal | ICD-10-CM

## 2021-11-02 DIAGNOSIS — R2689 Other abnormalities of gait and mobility: Secondary | ICD-10-CM | POA: Insufficient documentation

## 2021-11-02 DIAGNOSIS — R0602 Shortness of breath: Secondary | ICD-10-CM | POA: Insufficient documentation

## 2021-11-02 DIAGNOSIS — J984 Other disorders of lung: Secondary | ICD-10-CM | POA: Insufficient documentation

## 2021-11-02 DIAGNOSIS — J189 Pneumonia, unspecified organism: Principal | ICD-10-CM | POA: Insufficient documentation

## 2021-11-02 DIAGNOSIS — I1310 Hypertensive heart and chronic kidney disease without heart failure, with stage 1 through stage 4 chronic kidney disease, or unspecified chronic kidney disease: Secondary | ICD-10-CM | POA: Insufficient documentation

## 2021-11-02 DIAGNOSIS — R404 Transient alteration of awareness: Secondary | ICD-10-CM | POA: Insufficient documentation

## 2021-11-02 DIAGNOSIS — Z6830 Body mass index (BMI) 30.0-30.9, adult: Secondary | ICD-10-CM | POA: Insufficient documentation

## 2021-11-02 DIAGNOSIS — D259 Leiomyoma of uterus, unspecified: Secondary | ICD-10-CM | POA: Insufficient documentation

## 2021-11-02 DIAGNOSIS — E669 Obesity, unspecified: Secondary | ICD-10-CM | POA: Insufficient documentation

## 2021-11-02 DIAGNOSIS — M1612 Unilateral primary osteoarthritis, left hip: Secondary | ICD-10-CM | POA: Insufficient documentation

## 2021-11-02 DIAGNOSIS — Z95828 Presence of other vascular implants and grafts: Secondary | ICD-10-CM | POA: Insufficient documentation

## 2021-11-02 DIAGNOSIS — R42 Dizziness and giddiness: Secondary | ICD-10-CM | POA: Insufficient documentation

## 2021-11-02 LAB — COMPREHENSIVE METABOLIC PANEL
ALT: 14 U/L (ref 0–44)
AST: 27 U/L (ref 15–41)
Albumin: 4.1 g/dL (ref 3.5–5.0)
Alkaline Phosphatase: 56 U/L (ref 38–126)
Anion gap: 12 (ref 5–15)
BUN: 19 mg/dL (ref 8–23)
CO2: 21 mmol/L — ABNORMAL LOW (ref 22–32)
Calcium: 9.9 mg/dL (ref 8.9–10.3)
Chloride: 104 mmol/L (ref 98–111)
Creatinine, Ser: 1.28 mg/dL — ABNORMAL HIGH (ref 0.44–1.00)
GFR, Estimated: 44 mL/min — ABNORMAL LOW (ref >60)
Glucose, Bld: 125 mg/dL — ABNORMAL HIGH (ref 70–99)
Potassium: 4.4 mmol/L (ref 3.5–5.1)
Sodium: 137 mmol/L (ref 135–145)
Total Bilirubin: 1 mg/dL (ref 0.3–1.2)
Total Protein: 7.1 g/dL (ref 6.5–8.1)

## 2021-11-02 LAB — CBC WITH DIFFERENTIAL/PLATELET
Abs Immature Granulocytes: 0.03 10*3/uL (ref 0.00–0.07)
Basophils Absolute: 0.1 10*3/uL (ref 0.0–0.1)
Basophils Relative: 1 %
Eosinophils Absolute: 0 10*3/uL (ref 0.0–0.5)
Eosinophils Relative: 0 %
HCT: 45.6 % (ref 36.0–46.0)
Hemoglobin: 14.6 g/dL (ref 12.0–15.0)
Immature Granulocytes: 0 %
Lymphocytes Relative: 27 %
Lymphs Abs: 2.7 10*3/uL (ref 0.7–4.0)
MCH: 29.7 pg (ref 26.0–34.0)
MCHC: 32 g/dL (ref 30.0–36.0)
MCV: 92.9 fL (ref 80.0–100.0)
Monocytes Absolute: 0.7 10*3/uL (ref 0.1–1.0)
Monocytes Relative: 7 %
Neutro Abs: 6.5 10*3/uL (ref 1.7–7.7)
Neutrophils Relative %: 65 %
Platelets: 300 10*3/uL (ref 150–400)
RBC: 4.91 MIL/uL (ref 3.87–5.11)
RDW: 14.4 % (ref 11.5–15.5)
WBC: 9.9 10*3/uL (ref 4.0–10.5)
nRBC: 0 % (ref 0.0–0.2)

## 2021-11-02 LAB — I-STAT VENOUS BLOOD GAS, ED
Acid-base deficit: 1 mmol/L (ref 0.0–2.0)
Bicarbonate: 28.2 mmol/L — ABNORMAL HIGH (ref 20.0–28.0)
Calcium, Ion: 1.17 mmol/L (ref 1.15–1.40)
HCT: 45 % (ref 36.0–46.0)
Hemoglobin: 15.3 g/dL — ABNORMAL HIGH (ref 12.0–15.0)
O2 Saturation: 96 %
Potassium: 5.6 mmol/L — ABNORMAL HIGH (ref 3.5–5.1)
Sodium: 136 mmol/L (ref 135–145)
TCO2: 30 mmol/L (ref 22–32)
pCO2, Ven: 63.5 mmHg — ABNORMAL HIGH (ref 44–60)
pH, Ven: 7.256 (ref 7.25–7.43)
pO2, Ven: 94 mmHg — ABNORMAL HIGH (ref 32–45)

## 2021-11-02 LAB — CBG MONITORING, ED: Glucose-Capillary: 90 mg/dL (ref 70–99)

## 2021-11-02 LAB — RESP PANEL BY RT-PCR (FLU A&B, COVID) ARPGX2
Influenza A by PCR: NEGATIVE
Influenza B by PCR: NEGATIVE
SARS Coronavirus 2 by RT PCR: NEGATIVE

## 2021-11-02 LAB — TROPONIN I (HIGH SENSITIVITY)
Troponin I (High Sensitivity): 8 ng/L (ref <18)
Troponin I (High Sensitivity): 6 ng/L (ref <18)

## 2021-11-02 LAB — I-STAT CREATININE, ED: Creatinine, Ser: 1.4 mg/dL — ABNORMAL HIGH (ref 0.44–1.00)

## 2021-11-02 LAB — PROTIME-INR
INR: 1.2 (ref 0.8–1.2)
Prothrombin Time: 15.1 seconds (ref 11.4–15.2)

## 2021-11-02 LAB — CK: Total CK: 159 U/L (ref 38–234)

## 2021-11-02 LAB — LIPASE, BLOOD: Lipase: 42 U/L (ref 11–51)

## 2021-11-02 LAB — BRAIN NATRIURETIC PEPTIDE: B Natriuretic Peptide: 26.7 pg/mL (ref 0.0–100.0)

## 2021-11-02 LAB — LACTIC ACID, PLASMA
Lactic Acid, Venous: 1.9 mmol/L (ref 0.5–1.9)
Lactic Acid, Venous: 1.3 mmol/L (ref 0.5–1.9)

## 2021-11-02 MED ORDER — IOHEXOL 350 MG/ML SOLN
80.0000 mL | Freq: Once | INTRAVENOUS | Status: AC | PRN
  Administered 2021-11-02: 80 mL via INTRAVENOUS

## 2021-11-02 MED ORDER — SODIUM CHLORIDE 0.9 % IV SOLN
1.0000 g | Freq: Once | INTRAVENOUS | Status: AC
  Administered 2021-11-02: 1 g via INTRAVENOUS
  Filled 2021-11-02: qty 10

## 2021-11-02 MED ORDER — AMLODIPINE BESYLATE 5 MG PO TABS
5.0000 mg | ORAL_TABLET | Freq: Every day | ORAL | Status: DC
  Administered 2021-11-02 – 2021-11-03 (×2): 5 mg via ORAL
  Filled 2021-11-02 (×2): qty 1

## 2021-11-02 MED ORDER — RIVAROXABAN 20 MG PO TABS
20.0000 mg | ORAL_TABLET | Freq: Every evening | ORAL | Status: DC
  Administered 2021-11-02 – 2021-11-03 (×2): 20 mg via ORAL
  Filled 2021-11-02 (×2): qty 1

## 2021-11-02 MED ORDER — SODIUM CHLORIDE 0.9 % IV BOLUS
1000.0000 mL | Freq: Once | INTRAVENOUS | Status: AC
Start: 2021-11-02 — End: 2021-11-02
  Administered 2021-11-02: 1000 mL via INTRAVENOUS

## 2021-11-02 MED ORDER — FERROUS SULFATE 325 (65 FE) MG PO TABS
325.0000 mg | ORAL_TABLET | ORAL | Status: DC
  Administered 2021-11-02 – 2021-11-04 (×2): 325 mg via ORAL
  Filled 2021-11-02 (×2): qty 1

## 2021-11-02 MED ORDER — ACETAMINOPHEN 650 MG RE SUPP: 650.0000 mg | Freq: Four times a day (QID) | RECTAL | Status: DC | PRN

## 2021-11-02 MED ORDER — SODIUM CHLORIDE 0.9 % IV SOLN
500.0000 mg | Freq: Once | INTRAVENOUS | Status: AC
  Administered 2021-11-02: 500 mg via INTRAVENOUS
  Filled 2021-11-02: qty 5

## 2021-11-02 MED ORDER — ACETAMINOPHEN 325 MG PO TABS
650.0000 mg | ORAL_TABLET | Freq: Four times a day (QID) | ORAL | Status: DC | PRN
  Administered 2021-11-02 – 2021-11-04 (×6): 650 mg via ORAL
  Filled 2021-11-02 (×6): qty 2

## 2021-11-02 NOTE — Progress Notes (Signed)
RN received call from pts daughter. She asks to know patients tests results and diagnosis. RN asked patient who stated she would talk to her daughter herself.  PT talking with daughter via telephone.  ?

## 2021-11-02 NOTE — ED Provider Notes (Signed)
3:17 PM ?Care assumed from Dr. Tyrone Nine.  At time of transfer of care, patient is awaiting for CT PE study and further imaging of the abdomen.  Patient reportedly had syncopal episode with unresponsiveness, hypotension, and hypoxia.  Anticipate admission after work-up is completed.  ? ?5:48 PM ?CT scan returned showing no pulmonary embolism but does show evidence of worsened opacity concerning for pneumonia.  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 ? ?Patient does not take oxygen at home and is now on 3 L to maintain oxygen saturations. ? ?Given the patient's reported hypoxia, hypotension, intermittent unresponsiveness, and age in the setting of pneumonia, will order antibiotics and admit for further management. ? ?Clinical Impression: ?1. Unresponsiveness   ?2. Community acquired pneumonia, unspecified laterality   ? ? ?Disposition: Admit ? ?This note was prepared with assistance of Systems analyst. Occasional wrong-word or sound-a-like substitutions may have occurred due to the inherent limitations of voice recognition software. ? ? ? ?  ?Marnie Fazzino, Gwenyth Allegra, MD ?11/02/21 1807 ? ?

## 2021-11-02 NOTE — ED Notes (Signed)
Patient transported to CT 

## 2021-11-02 NOTE — H&P (Addendum)
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 of superficial venous thrombosis in the lower extremities, bilaterally. -No evidence of popliteal cyst, bilaterally. RIGHT: - Findings consistent with age indeterminate deep vein thrombosis involving the right femoral vein, and right popliteal vein.  LEFT: - There is no evidence of deep vein thrombosis in the lower extremity. - There is no evidence of superficial venous thrombosis.  *See table(s) above for measurements and observations.    Preliminary      Burrel Legrand, DO 11/02/2021, 7:28 PM PGY-1, [facility] FPTS Intern pager: [phone], text pages welcome

## 2021-11-02 NOTE — ED Provider Notes (Signed)
?[facility] ?Provider Note ? ? ?CSN: [id number] ?Arrival date & time: 11/02/21  1423 ? ?  ? ?History ? ?Chief Complaint  ?Patient presents with  ? lethargic  ? ? ?[name] is a 76 y.o. female. ? ?76 yo F with a cc of a period of unresponsiveness.  Called out for unresponsive at home.  Found by EMS without speech, opening eyes to verbal.  Improved enroute.  Patient was having some leg pain for the past couple days, hx of PE.  ? ? ? ?  ? ?Home Medications ?Prior to Admission medications   ?Medication Sig Start Date End Date Taking? Authorizing Provider  ?amLODipine (NORVASC) 5 MG tablet TAKE 1 TABLET BY MOUTH EVERYDAY AT BEDTIME 05/16/21   Leeanne Rio, MD  ?ferrous sulfate 325 (65 FE) MG tablet Take 1 tablet (325 mg total) by mouth every other day. 04/05/21   Simmons-Robinson, Makiera, MD  ?Alveda Reasons 20 MG TABS tablet TAKE 1 TABLET BY MOUTH EVERY DAY 08/20/21   McDiarmid, Blane Ohara, MD  ?   ? ?Allergies    ?Patient has no known allergies.   ? ?Review of Systems   ?Review of Systems ? ?Physical Exam ?Updated Vital Signs ?BP (!) 142/67   Pulse 73   Temp (S) 97.8 ?F (36.6 ?C) (Rectal)   Resp 13   Ht '5\' 4"'$  (1.626 m)   Wt 78 kg   SpO2 100%   BMI 29.52 kg/m?  ?Physical Exam ?Vitals and nursing note reviewed.  ?Constitutional:   ?   General: She is not in acute distress. ?   Appearance: She is well-developed. She is not diaphoretic.  ?HENT:  ?   Head: Normocephalic and atraumatic.  ?Eyes:  ?   Pupils: Pupils are equal, round, and reactive to light.  ?Cardiovascular:  ?   Rate and Rhythm: Normal rate and regular rhythm.  ?   Heart sounds: No murmur heard. ?  No friction rub. No gallop.  ?Pulmonary:  ?   Effort: Pulmonary effort is normal.  ?   Breath sounds: No wheezing or rales.  ?Abdominal:  ?   General: There is no distension.  ?   Palpations: Abdomen is soft.  ?   Tenderness: There is no abdominal tenderness.  ?Musculoskeletal:     ?   General: No tenderness.  ?    Cervical back: Normal range of motion and neck supple.  ?Skin: ?   General: Skin is warm and dry.  ?Neurological:  ?   Mental Status: She is alert.  ?   Comments: Nods yes an no, follows commands  ?Psychiatric:     ?   Behavior: Behavior normal.  ? ? ?ED Results / Procedures / Treatments   ?Labs ?(all labs ordered are listed, but only abnormal results are displayed) ?Labs Reviewed  ?I-STAT CREATININE, ED - Abnormal; Notable for the following components:  ?    Result Value  ? Creatinine, Ser 1.40 (*)   ? All other components within normal limits  ?I-STAT VENOUS BLOOD GAS, ED - Abnormal; Notable for the following components:  ? pCO2, Ven 63.5 (*)   ? pO2, Ven 94 (*)   ? Bicarbonate 28.2 (*)   ? Potassium 5.6 (*)   ? Hemoglobin 15.3 (*)   ? All other components within normal limits  ?CULTURE, BLOOD (ROUTINE X 2)  ?CULTURE, BLOOD (ROUTINE X 2)  ?CBC WITH DIFFERENTIAL/PLATELET  ?COMPREHENSIVE METABOLIC PANEL  ?LIPASE, BLOOD  ?BRAIN NATRIURETIC  PEPTIDE  ?LACTIC ACID, PLASMA  ?LACTIC ACID, PLASMA  ?URINALYSIS, ROUTINE W REFLEX MICROSCOPIC  ?PROTIME-INR  ?CBG MONITORING, ED  ?TROPONIN I (HIGH SENSITIVITY)  ? ? ?EKG ?EKG Interpretation ? ?Date/Time:  Friday November 02 2021 14:24:02 EDT ?Ventricular Rate:  68 ?PR Interval:  150 ?QRS Duration: 102 ?QT Interval:  443 ?QTC Calculation: 472 ?R Axis:   -52 ?Text Interpretation: Sinus rhythm Atrial premature complexes Right atrial enlargement Left axis deviation No significant change since last tracing Confirmed by Deno Etienne [phone]) on 11/02/2021 2:32:25 PM ? ?Radiology ?DG Chest Port 1 View ? ?Result Date: 11/02/2021 ?CLINICAL DATA:  Syncope EXAM: PORTABLE CHEST 1 VIEW COMPARISON:  Chest x-ray 03/11/2021 FINDINGS: Heart size and mediastinal contours are within normal limits. No suspicious pulmonary opacities identified. No pleural effusion or pneumothorax visualized. No acute osseous abnormality appreciated. IMPRESSION: No acute intrathoracic process identified. Electronically Signed    By: Ofilia Neas M.D.   On: 11/02/2021 14:42   ? ?Procedures ?Procedures  ? ? ?Medications Ordered in ED ?Medications  ?sodium chloride 0.9 % bolus 1,000 mL (1,000 mLs Intravenous New Bag/Given 11/02/21 1448)  ? ? ?ED Course/ Medical Decision Making/ A&P ?  ?                        ?Medical Decision Making ?Amount and/or Complexity of Data Reviewed ?Labs: ordered. ?Radiology: ordered. ? ? ?76 yo F with unresponsiveness.  Found unresponsive home. Diaphoretic, hypotensive, hypoxic.  Hx of DVT, complaining of leg pain.  Patient mental status has improved, syncope?  With hx of DVT likely will CT to eval for PE.   ? ?Patient care was signed out to Dr. Sherry Ruffing, please see his note for further details care in the ED. ? ?The patients results and plan were reviewed and discussed.   ?Any x-rays performed were independently reviewed by myself.  ? ?Differential diagnosis were considered with the presenting HPI. ? ?Medications  ?sodium chloride 0.9 % bolus 1,000 mL (1,000 mLs Intravenous New Bag/Given 11/02/21 1448)  ? ? ?Vitals:  ? 11/02/21 1430 11/02/21 1440 11/02/21 1441 11/02/21 1450  ?BP: 129/69 (!) 85/58 131/68 (!) 142/67  ?Pulse: 71 80 84 73  ?Resp: 14 (!) '23 18 13  '$ ?Temp:   (S) 97.8 ?F (36.6 ?C)   ?TempSrc:   (S) Rectal   ?SpO2: 100% 100% 100% 100%  ?Weight:   78 kg   ?Height:   '5\' 4"'$  (1.626 m)   ? ? ?Final diagnoses:  ?Unresponsiveness  ? ? ?Admission/ observation were discussed with the admitting physician, patient and/or family and they are comfortable with the plan.  ? ? ? ? ? ? ? ? ?Final Clinical Impression(s) / ED Diagnoses ?Final diagnoses:  ?Unresponsiveness  ? ? ?Rx / DC Orders ?ED Discharge Orders   ? ? None  ? ?  ? ? ?  ?Deno Etienne, DO ?11/02/21 1529 ? ?

## 2021-11-02 NOTE — ED Triage Notes (Signed)
To ED via [facility] from home, had an episode on decreased LOC to unresponsiveness with EMS- extremely diaphoretic, clothing soaking wet. Not diabetic. States has had the same "A few months ago"  ?EMS attempt x 5 for IV without success-  ?On arrival, awake, still groggy, oriented x 4, is unsure of what happened.,  ? ?

## 2021-11-03 ENCOUNTER — Encounter (HOSPITAL_COMMUNITY): Payer: Self-pay | Admitting: Family Medicine

## 2021-11-03 DIAGNOSIS — J9601 Acute respiratory failure with hypoxia: Secondary | ICD-10-CM | POA: Diagnosis not present

## 2021-11-03 DIAGNOSIS — J189 Pneumonia, unspecified organism: Secondary | ICD-10-CM | POA: Diagnosis not present

## 2021-11-03 LAB — RAPID URINE DRUG SCREEN, HOSP PERFORMED
Amphetamines: NOT DETECTED
Barbiturates: NOT DETECTED
Benzodiazepines: NOT DETECTED
Cocaine: NOT DETECTED
Opiates: NOT DETECTED
Tetrahydrocannabinol: NOT DETECTED

## 2021-11-03 LAB — URINALYSIS, ROUTINE W REFLEX MICROSCOPIC
Bilirubin Urine: NEGATIVE
Glucose, UA: NEGATIVE mg/dL
Ketones, ur: NEGATIVE mg/dL
Leukocytes,Ua: NEGATIVE
Nitrite: NEGATIVE
Protein, ur: 30 mg/dL — AB
Specific Gravity, Urine: 1.015 (ref 1.005–1.030)
pH: 5.5 (ref 5.0–8.0)

## 2021-11-03 LAB — CBC
HCT: 37.1 % (ref 36.0–46.0)
Hemoglobin: 12.3 g/dL (ref 12.0–15.0)
MCH: 29.9 pg (ref 26.0–34.0)
MCHC: 33.2 g/dL (ref 30.0–36.0)
MCV: 90 fL (ref 80.0–100.0)
Platelets: 262 10*3/uL (ref 150–400)
RBC: 4.12 MIL/uL (ref 3.87–5.11)
RDW: 14.6 % (ref 11.5–15.5)
WBC: 8.9 10*3/uL (ref 4.0–10.5)
nRBC: 0 % (ref 0.0–0.2)

## 2021-11-03 LAB — URINALYSIS, MICROSCOPIC (REFLEX)

## 2021-11-03 LAB — BASIC METABOLIC PANEL
Anion gap: 9 (ref 5–15)
BUN: 19 mg/dL (ref 8–23)
CO2: 22 mmol/L (ref 22–32)
Calcium: 9 mg/dL (ref 8.9–10.3)
Chloride: 106 mmol/L (ref 98–111)
Creatinine, Ser: 1.04 mg/dL — ABNORMAL HIGH (ref 0.44–1.00)
GFR, Estimated: 56 mL/min — ABNORMAL LOW (ref >60)
Glucose, Bld: 100 mg/dL — ABNORMAL HIGH (ref 70–99)
Potassium: 3.4 mmol/L — ABNORMAL LOW (ref 3.5–5.1)
Sodium: 137 mmol/L (ref 135–145)

## 2021-11-03 MED ORDER — SODIUM CHLORIDE 0.9 % IV SOLN
1.0000 g | Freq: Once | INTRAVENOUS | Status: AC
  Administered 2021-11-03: 1 g via INTRAVENOUS
  Filled 2021-11-03: qty 10

## 2021-11-03 MED ORDER — AZITHROMYCIN 250 MG PO TABS
250.0000 mg | ORAL_TABLET | Freq: Every day | ORAL | Status: DC
  Administered 2021-11-03 – 2021-11-04 (×2): 250 mg via ORAL
  Filled 2021-11-03 (×2): qty 1

## 2021-11-03 MED ORDER — POTASSIUM CHLORIDE 10 MEQ/100ML IV SOLN
10.0000 meq | INTRAVENOUS | Status: AC
  Administered 2021-11-03 (×4): 10 meq via INTRAVENOUS
  Filled 2021-11-03 (×4): qty 100

## 2021-11-03 NOTE — Care Management Obs Status (Signed)
MEDICARE OBSERVATION STATUS NOTIFICATION ? ? ?Patient Details  ?Name: [name] ?MRN: [id number] ?Date of Birth: 1945/08/06 ? ? ?Medicare Observation Status Notification Given:  Yes ? ? ? ?Carles Collet, RN ?11/03/2021, 4:49 PM ?

## 2021-11-03 NOTE — Evaluation (Signed)
Occupational Therapy Evaluation ?Patient Details ?Name: [name] ?MRN: [id number] ?DOB: 29-Dec-1945 ?Today's Date: 11/03/2021 ? ? ?History of Present Illness The pt is a 76 yo female presenting 3/31 after period of unresponsiveness at home. Imaging clear of PE, but suggests community aquired PNA. PMH includes: RLE DVT s/p IVC filter, HTN, HLD, and CKD III.  ? ?Clinical Impression ?  ?Patient admitted for the diagnosis above.  PTA she lives at home with family, and assists with caring for her 75 year old Down's Syndrome daughter.  Pain to her R leg, dizziness and decreased activity tolerance are the primary deficits.  OT can follow in the acute setting, but given family assist at home, no post acute OT is anticipated.   ? ?Supine 139/66 ?Sit 136/80 ?Stand 146/82 ?Stand after 3 min 133/67  ?   ? ?Recommendations for follow up therapy are one component of a multi-disciplinary discharge planning process, led by the attending physician.  Recommendations may be updated based on patient status, additional functional criteria and insurance authorization.  ? ?Follow Up Recommendations ? No OT follow up  ?  ?Assistance Recommended at Discharge Intermittent Supervision/Assistance  ?Patient can return home with the following   ? ?  ?Functional Status Assessment ? Patient has had a recent decline in their functional status and demonstrates the ability to make significant improvements in function in a reasonable and predictable amount of time.  ?Equipment Recommendations ? None recommended by OT  ?  ?Recommendations for Other Services   ? ? ?  ?Precautions / Restrictions Precautions ?Precautions: Fall ?Precaution Comments: watch BP ?Restrictions ?Weight Bearing Restrictions: No  ? ?  ? ?Mobility Bed Mobility ?Overal bed mobility: Modified Independent ?  ?  ?  ?  ?  ?  ?  ?  ? ?Transfers ?Overall transfer level: Needs assistance ?  ?Transfers: Sit to/from Stand ?Sit to Stand: Min guard, From elevated surface ?  ?  ?  ?  ?  ?  ?   ? ?  ?Balance Overall balance assessment: Mild deficits observed, not formally tested ?  ?  ?  ?  ?  ?  ?  ?  ?  ?  ?  ?  ?  ?  ?  ?  ?  ?  ?   ? ?ADL either performed or assessed with clinical judgement  ? ?ADL   ?  ?  ?  ?  ?  ?  ?  ?  ?  ?  ?  ?  ?  ?  ?  ?  ?  ?  ?  ?General ADL Comments: Needing generalized Min Guard when standing, or mobilizing to the toilet  ? ? ? ?Vision Baseline Vision/History: 1 Wears glasses ?Patient Visual Report: No change from baseline ?   ?   ?Perception Perception ?Perception: Within Functional Limits ?  ?Praxis Praxis ?Praxis: Intact ?  ? ?Pertinent Vitals/Pain Pain Assessment ?Pain Assessment: Faces ?Faces Pain Scale: Hurts little more ?Pain Location: behind knees when standing for 3 min ?Pain Intervention(s): Monitored during session  ? ? ? ?Hand Dominance Right ?  ?Extremity/Trunk Assessment Upper Extremity Assessment ?Upper Extremity Assessment: Overall WFL for tasks assessed ?  ?Lower Extremity Assessment ?Lower Extremity Assessment: Defer to PT evaluation ?  ?Cervical / Trunk Assessment ?Cervical / Trunk Assessment: Kyphotic ?  ?Communication Communication ?Communication: No difficulties ?  ?Cognition Arousal/Alertness: Awake/alert ?Behavior During Therapy: [facility] for tasks assessed/performed ?Overall Cognitive Status: Within Functional Limits for tasks assessed ?  ?  ?  ?  ?  ?  ?  ?  ?  ?  ?  ?  ?  ?  ?  ?  ?  ?  ?  ?  General Comments   Mild dizziness reported ? ?  ?Exercises   ?  ?Shoulder Instructions    ? ? ?Home Living Family/patient expects to be discharged to:: Private residence ?Living Arrangements: Children ?Available Help at Discharge: Family;Available 24 hours/day ?Type of Home: House ?Home Access: Level entry ?  ?  ?Home Layout: One level ?  ?  ?Bathroom Shower/Tub: Tub/shower unit ?  ?Bathroom Toilet: Standard ?Bathroom Accessibility: Yes ?How Accessible: Accessible via walker ?Home Equipment: Kasandra Knudsen - single point;Toilet riser;Shower seat ?  ?Additional Comments: pt  is caregiver for 7 yo daughter with down syndrome, assists her with bathing ?  ? ?  ?Prior Functioning/Environment Prior Level of Function : Independent/Modified Independent;Driving ?  ?  ?  ?  ?  ?  ?Mobility Comments: pt reports she will "sometimes" grab a cane but doesn't need in house. no falls ?ADLs Comments: pt reports fully independent, family assists with meal prep and home management. ?  ? ?  ?  ?OT Problem List: Decreased activity tolerance;Pain;Impaired balance (sitting and/or standing) ?  ?   ?OT Treatment/Interventions: Self-care/ADL training;Patient/family education;Balance training;Therapeutic activities  ?  ?OT Goals(Current goals can be found in the care plan section) Acute Rehab OT Goals ?Patient Stated Goal: feel better and get back home ?OT Goal Formulation: With patient ?Time For Goal Achievement: 11/17/21 ?Potential to Achieve Goals: Good ?ADL Goals ?Pt Will Perform Grooming: with modified independence;standing ?Pt Will Perform Lower Body Dressing: with modified independence;sit to/from stand ?Pt Will Transfer to Toilet: with modified independence;ambulating;regular height toilet  ?OT Frequency: Min 2X/week ?  ? ?Co-evaluation   ?  ?  ?  ?  ? ?  ?AM-PAC OT "6 Clicks" Daily Activity     ?Outcome Measure Help from another person eating meals?: None ?Help from another person taking care of personal grooming?: A Little ?Help from another person toileting, which includes using toliet, bedpan, or urinal?: A Little ?Help from another person bathing (including washing, rinsing, drying)?: A Little ?Help from another person to put on and taking off regular upper body clothing?: A Little ?Help from another person to put on and taking off regular lower body clothing?: A Little ?6 Click Score: 19 ?  ?End of Session Nurse Communication: Mobility status ? ?Activity Tolerance: Patient tolerated treatment well ?Patient left: in bed;with call bell/phone within reach;with family/visitor present ? ?OT Visit  Diagnosis: Unsteadiness on feet (R26.81);Pain ?Pain - Right/Left: Right ?Pain - part of body: Leg  ?              ?Time: 2751-7001 ?OT Time Calculation (min): 22 min ?Charges:  OT General Charges ?$OT Visit: 1 Visit ?OT Evaluation ?$OT Eval Moderate Complexity: 1 Mod ? ?11/03/2021 ? ?RP, OTR/L ? ?Acute Rehabilitation Services ? ?Office:  [phone] ? ? ?Tawsha Terrero D Timonthy Hovater ?11/03/2021, 3:52 PM ?

## 2021-11-03 NOTE — Progress Notes (Signed)
SATURATION QUALIFICATIONS: (This note is used to comply with regulatory documentation for home oxygen)  Patient Saturations on Room Air at Rest = 98%  Patient Saturations on Room Air while Ambulating = 98%     

## 2021-11-03 NOTE — Care Management CC44 (Signed)
Condition Code 44 Documentation Completed ? ?Patient Details  ?Name: [name] ?MRN: [id number] ?Date of Birth: [birth year] ? ? ?Condition Code 44 given:  Yes ?Patient signature on Condition Code 44 notice:  Yes ?Documentation of 2 MD's agreement:  Yes ?Code 44 added to claim:  Yes ? ? ? ?Carles Collet, RN ?11/03/2021, 4:49 PM ? ?

## 2021-11-03 NOTE — Progress Notes (Signed)
Mobility Specialist Progress Note  ? ? 11/03/21 1600  ?Mobility  ?Activity Ambulated with assistance in hallway  ?Level of Assistance Standby assist, set-up cues, supervision of patient - no hands on  ?Assistive Device Front wheel walker  ?Distance Ambulated (ft) 270 ft  ?Activity Response Tolerated well  ?$Mobility charge 1 Mobility  ? ?Pt received in bed and agreeable. No complaints on walk. Ambulated on RA. Returned to bed with call bell in reach.   ? ?Hildred Alamin ?Mobility Specialist  ?  ?

## 2021-11-03 NOTE — Progress Notes (Addendum)
Family Medicine Teaching Service ?Daily Progress Note ?Intern Pager: [phone] ? ?Patient name: RAVLEEN RIES Medical record number: [id number] ?Date of birth: [birth year] Age: 76 y.o. Gender: female ? ?Primary Care Provider: Leeanne Rio, MD ?Consultants: None  ?Code Status: Full  ? ?Pt Overview and Major Events to Date:  ?11/02/21: Admitted to [facility]  ? ?Assessment and Plan: ?ORVILLE WIDMANN is a 76 y.o. female presenting from home with weakness and lightheadedness. PMH is significant for HTN, HLD, GERD, hx PE & DVT, OA, CKD3, sleep apnea. ? ?Weakness, Lightheadedness likely 2/2 dehydration  ?Hypoxia 2/2 likely CAP  ?This AM, the patient was on 3L [place] satting 100% and reports that her symptoms have greatly improved, when I saw her, she was on RA. Patient has received IV CTX and Azithromycin and will be transitioned today to azithro orally for CAP coverage. Unsure if cause of acute hypoxia and hypotension is related to possible PNA. Could be related to cardiac cause vs infectious vs dehydration vs acute illness. Will continue monitoring Jaquia over the course of the day as she is now on RA. Will also ambulate her with PT to assess for desaturations ?-Transition to oral Azithro and continue CTX for now  ?-Monitor oxygen status  ?-PT/OT evaluate and treat  ?-Encourage PO fluid intake  ?-Consider echo if desaturations happen with ambulation  ?-Monitor CBC, BMP  ?-Monitor mental status  ?-Ambulate with pulse ox  ? ?Hx of PE & DVT s/p IVC filter  ?Home medications Xarelto 20 mg daily, has been on since 2015.  ?-Continue with home medication  ? ?Mood changes with poor PO intake  ?Patient has only eaten 1 meal a day and has noted depression as possible cause of loss of appetite  ?-Patient may need SSRI or Mirtazepine to assist with mood changes  ? ?HTN  ?Continuing home amlodipine 5 mg daily, normotensive today. ?-Monitor BP  ? ?HLD  ?Last lipid panel on 8/722, was rx Atorvastatin 40 mg daily. She will need updated  lipid panel and to start Atorvastatin outpt vs inpt.  ?-Add lipid panel to labwork in AM  ?-Possibly restart Atorvastatin prior to d/c  ? ?CKD3  ?Cr 1.28>1.04  ?-Continue monitoring  ?-Avoid nephrotoxic agents  ? ? ?FEN/GI: Regular diet  ?PPx: Xarelto  ?Dispo:Home today or tomorrow. Barriers include PT/OT and oral abx transition.  ? ?Subjective:  ?Patient reports that she is doing well this AM and breathing well.  ? ?Objective: ?Temp:  [97.6 ?F (36.4 ?C)-98.6 ?F (37 ?C)] 97.6 ?F (36.4 ?C) (04/01 1031) ?Pulse Rate:  [71-86] 78 (04/01 1031) ?Resp:  [10-28] 20 (04/01 1031) ?BP: (85-174)/(58-90) 122/71 (04/01 1031) ?SpO2:  [97 %-100 %] 100 % (04/01 1100) ?Weight:  [77.7 kg-78 kg] 77.7 kg (04/01 0442) ? ?General: Alert and oriented in no apparent distress ?Heart: Regular rate and rhythm with no murmurs appreciated ?Lungs: Slightly diminished at in lower lung but otherwise benign with good air movement  ?Abdomen: Bowel sounds present, no abdominal pain ?Skin: Warm and dry ? ?Laboratory: ?Recent Labs  ?Lab 11/02/21 ?1427 11/02/21 ?1506 11/03/21 ?0353  ?WBC 9.9  --  8.9  ?HGB 14.6 15.3* 12.3  ?HCT 45.6 45.0 37.1  ?PLT 300  --  262  ? ?Recent Labs  ?Lab 11/02/21 ?1427 11/02/21 ?1506 11/03/21 ?0353  ?NA 137 136 137  ?K 4.4 5.6* 3.4*  ?CL 104  --  106  ?CO2 21*  --  22  ?BUN 19  --  19  ?CREATININE 1.28* 1.40*  1.04*  ?CALCIUM 9.9  --  9.0  ?PROT 7.1  --   --   ?BILITOT 1.0  --   --   ?ALKPHOS 56  --   --   ?ALT 14  --   --   ?AST 27  --   --   ?GLUCOSE 125*  --  100*  ? ? ? ? ?Erskine Emery, MD ?11/03/2021, 11:51 AM ?PGY-1, [facility] ?[place] Intern pager: [phone], text pages welcome ? ?

## 2021-11-03 NOTE — Hospital Course (Addendum)
[name] is a 76 year-old female who presented with weakness/lightheadedness, admitted for hypoxia and presumed CAP. PMH significant for HTN, HLD, GERD, hx PE & DVT, OA, CKD3, sleep apnea. Hospital course is outlined below: ? ?Hypoxia  concern for community-acquired pneumonia versus hypovolemic syncope ?Patient came in after she had episode of lightheadedness and weakness.  EMS reports she was diaphoretic, hypotensive, hypoxic to 70s. Mental status improved when presenting to ED. Received 1L [place] bolus in ED. Hypoxia improved with 2L [place]. Workup negative for ACS, PE, stroke, hypoglycemia, anemia as CBC, troponin, BNP unremarkable. DVT U/S did show age indeterminate DVT involving the right femoral vein and right popliteal vein. CTA negative for PE, although notable for patchy airspace opacities in the RLL most likely related to infection/inflammation. No signs of electrolyte abnormalities on BMP. Given opacities on CTA, pt treated with CTX x1 in ED, was continued on azithromycin orally and ceftriaxone. She remained afebrile and was weaned to room air on day 2 of hospitalization and maintained oxygen saturation with ambulation.  The patient's son reported that he had episodes of syncope related to hypovolemia and that the patient remained dehydrated due to caffeine intake through coffee and lack of water during the day.  She was discharged home on PO antibiotics to complete a 5 day course.  ? ?Right DVT  ?DVT U/S notable for age intermediate clot in right femoral vein and right popliteal vein. Patient has IVC filter in place and is on chronic anticoagulation with Xarelto. Most likely chronic in nature, did not make medication adjustments.  ? ?Mood Changes with poor PO intake ?Pt reported only eating 1 meal a day. Attributes this to some depression and social isolation. Likely contributed to weakness/lightheadedness prior to admission.  Patient likely needs long-term treatment with SSRI.  We will have her  follow-up with PCP for this discussion. ? ?Hypertension ?Continued on Amlodipine 5 mg throughout hospitalization. She had elevated pressures up to 195/91 on day of discharge but remained asymptomatic. No medication adjustments were made in the hospital but patient will have close clinic follow up for monitoring. Strict return precautions provided at discharge.  ? ?Other conditions (HLD, hx PE/DVT) chronic and stable. Home medications resumed. ? ?PCP Follow-up items ?PHQ-9 at follow-up. Consider starting Mirtazepine to help with poor appetite and mood changes. ?Monitor weight closely in light of significant unintentional weight loss ?Ensure completion of antibiotics (End date: 4/4) ?Asymptomatic hypertension at discharge. Follow up BP and adjust medication as needed- avoid hypotension given history of syncope in the past (from dehydration).  ?F/u CT Chest recommended in 3 months (end of June or July) to follow up on patchy airspace opacities (per Radiology report)  ?

## 2021-11-03 NOTE — Progress Notes (Signed)
FPTS Brief Progress Note ? ?S: States she is doing well. Tired and ready to go to sleep. Hopes to go home tomorrow.  ? ? ?O: ?BP (!) 174/58   Pulse 83   Temp 98 ?F (36.7 ?C) (Oral)   Resp (!) 9   Ht '5\' 4"'$  (1.626 m)   Wt 77.7 kg   SpO2 98%   BMI 29.42 kg/m?   ?General: Appears well, no acute distress. Age appropriate. Resting comfortably in bed. ?Respiratory: normal effort ?Neuro: alert and oriented ?Psych: normal affect ? ?A/P: ?Weakness, Lightheadedness likely 2/2 dehydration  ?Hypoxia 2/2 likely CAP ?- Orders reviewed. Labs for AM ordered, which was adjusted as needed.  ?- follow up UOP in the morning ?- discharge 4/2 ? ?Gerlene Fee, DO ?11/03/2021, 9:19 PM ?PGY-3, [place] Night Resident  ?Please page [phone] with questions.  ? ? ?

## 2021-11-03 NOTE — Evaluation (Signed)
Physical Therapy Evaluation ?Patient Details ?Name: [name] ?MRN: [id number] ?DOB: 1946-05-27 ?Today's Date: 11/03/2021 ? ?History of Present Illness ? The pt is a 76 yo female presenting 3/31 after period of unresponsiveness at home. Imaging clear of PE, but suggests community aquired PNA. PMH includes: RLE DVT s/p IVC filter, HTN, HLD, and CKD III. ?  ?Clinical Impression ? Pt in bed upon arrival of PT, agreeable to evaluation at this time. Prior to admission the pt was independent without need for AD in the home, but did intermittently use a SPC for community ambulation. The pt reports no recent falls or mobility issues. The pt was able to complete transitions to sitting EOB and ambulating in the room with minA initially but progressed to minG with mobility. SpO2 stable on RA with all activity this morning, and was left on RA with RN aware. The pt did have elevated BP and HR with mobility, and required seated rest after 3 min standing due to reports of pain behind her knees. The pt had no overt LOB but ambulates with slow speed with significant trunk flexion and lateral sway which both the pt and her son report is normal for her. The pt will continue to benefit from skilled PT acutely to progress mobility and activity, but is safe to return home with family assistance and no additional DME.   ? ?VITALS:  ?- sitting EOB - BP: 118/70 (83); HR: 95bpm ?- standing - BP: 135/80 (94); HR: 131bpm ?- standing after 3 min - BP: 189/143 (156); HR: 137bpm ?- sitting EOB - 141/75 (92); HR: 141bpm ?- supine in bed at end of session - BP: 130/67 (86); HR: 93bpm ?   ? ?Recommendations for follow up therapy are one component of a multi-disciplinary discharge planning process, led by the attending physician.  Recommendations may be updated based on patient status, additional functional criteria and insurance authorization. ? ?Follow Up Recommendations No PT follow up ? ?  ?Assistance Recommended at Discharge Intermittent  Supervision/Assistance  ?Patient can return home with the following ? Assistance with cooking/housework;Direct supervision/assist for medications management;Help with stairs or ramp for entrance ? ?  ?Equipment Recommendations None recommended by PT  ?Recommendations for Other Services ?    ?  ?Functional Status Assessment Patient has had a recent decline in their functional status and demonstrates the ability to make significant improvements in function in a reasonable and predictable amount of time.  ? ?  ?Precautions / Restrictions Precautions ?Precautions: Fall ?Precaution Comments: watch BP ?Restrictions ?Weight Bearing Restrictions: No  ? ?  ? ?Mobility ? Bed Mobility ?Overal bed mobility: Modified Independent ?  ?  ?  ?  ?  ?  ?General bed mobility comments: increased time, no assist given ?  ? ?Transfers ?Overall transfer level: Needs assistance ?Equipment used: None, 1 person hand held assist ?Transfers: Sit to/from Stand ?Sit to Stand: Min assist, Min guard ?  ?  ?  ?  ?  ?General transfer comment: minA through HHA for initial stand, then pt completing without UE support or assist ?  ? ?Ambulation/Gait ?Ambulation/Gait assistance: Min guard ?Gait Distance (Feet): 25 Feet ?Assistive device: None ?Gait Pattern/deviations: Step-through pattern, Narrow base of support, Trunk flexed, Decreased stride length ?Gait velocity: decreased ?Gait velocity interpretation: <1.31 ft/sec, indicative of household ambulator ?  ?General Gait Details: pt with small steps and increased alteral movement. maintaining significant trunk flexion but son present states this is the most upright he has ever seen her walk ? ?  ? ?  Balance Overall balance assessment: Mild deficits observed, not formally tested ?  ?  ?  ?  ?  ?  ?  ?  ?  ?  ?  ?  ?  ?  ?  ?  ?  ?  ?   ? ? ? ?Pertinent Vitals/Pain Pain Assessment ?Pain Assessment: Faces ?Faces Pain Scale: Hurts little more ?Pain Location: behind knees when standing for 3 min ?Pain  Intervention(s): Limited activity within patient's tolerance, Monitored during session, Repositioned  ? ? ?Home Living Family/patient expects to be discharged to:: Private residence ?Living Arrangements: Children ?Available Help at Discharge: Family;Available 24 hours/day ?Type of Home: House ?Home Access: Level entry ?  ?  ?  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  ?Prior Function Prior Level of Function : Independent/Modified Independent;Driving ?  ?  ?  ?  ?  ?  ?Mobility Comments: pt reports she will "sometimes" grab a cane but doesn't need in house. no falls ?ADLs Comments: pt reports fully independent ?  ? ? ?Hand Dominance  ? Dominant Hand: Right ? ?  ?Extremity/Trunk Assessment  ? Upper Extremity Assessment ?Upper Extremity Assessment: Overall WFL for tasks assessed ?  ? ?Lower Extremity Assessment ?Lower Extremity Assessment: Overall WFL for tasks assessed ?  ? ?Cervical / Trunk Assessment ?Cervical / Trunk Assessment: Kyphotic  ?Communication  ? Communication: No difficulties  ?Cognition Arousal/Alertness: Awake/alert ?Behavior During Therapy: [facility] for tasks assessed/performed ?Overall Cognitive Status: Impaired/Different from baseline ?Area of Impairment: Memory ?  ?  ?  ?  ?  ?  ?  ?  ?  ?  ?Memory: Decreased short-term memory ?  ?  ?  ?  ?General Comments: pt repeating some information or stories 2-3 times during evaluation, even when unprompted. able to follow cues and answer questions about home set up with son present to confirm accuracy ?  ?  ? ?  ?General Comments General comments (skin integrity, edema, etc.): SpO2 stable on RA, 96-99%. BP 118/70 (83) with transition to sitting, and high of 189/143 (156) after standing for 3 min. ? ?  ?Exercises    ? ?Assessment/Plan  ?  ?PT Assessment Patient needs continued PT services  ?PT Problem List Decreased  activity tolerance;Decreased balance;Decreased mobility;Cardiopulmonary status limiting activity ? ?   ?  ?PT Treatment Interventions DME instruction;Gait training;Stair training;Functional mobility training;Therapeutic activities;Therapeutic exercise;Balance training;Patient/family education   ? ?PT Goals (Current goals can be found in the Care Plan section)  ?Acute Rehab PT Goals ?Patient Stated Goal: return home ?PT Goal Formulation: With patient ?Time For Goal Achievement: 11/17/21 ?Potential to Achieve Goals: Good ? ?  ?Frequency Min 3X/week ?  ? ? ?   ?AM-PAC PT "6 Clicks" Mobility  ?Outcome Measure Help needed turning from your back to your side while in a flat bed without using bedrails?: None ?Help needed moving from lying on your back to sitting on the side of a flat bed without using bedrails?: None ?Help needed moving to and from a bed to a chair (including a wheelchair)?: None ?Help needed standing up from a chair using your arms (e.g., wheelchair or bedside chair)?: A Little ?Help needed to walk in hospital room?: A Little ?Help needed climbing 3-5 steps with a railing? : A Little ?6 Click Score: 21 ? ?  ?End of Session Equipment Utilized During Treatment: Gait  belt ?Activity Tolerance: Patient tolerated treatment well ?Patient left: in bed;with call bell/phone within reach;with nursing/sitter in room;with family/visitor present ?Nurse Communication: Mobility status ?PT Visit Diagnosis: Other abnormalities of gait and mobility (R26.89) ?  ? ?Time: 7159-5396 ?PT Time Calculation (min) (ACUTE ONLY): 34 min ? ? ?Charges:   PT Evaluation ?$PT Eval Low Complexity: 1 Low ?PT Treatments ?$Therapeutic Exercise: 8-22 mins ?  ?   ? ? ?West Carbo, PT, DPT  ? ?Acute Rehabilitation Department ?Pager #: [phone] - [phone] ? ?Sandra Cockayne ?11/03/2021, 8:58 AM ? ?

## 2021-11-04 DIAGNOSIS — J189 Pneumonia, unspecified organism: Secondary | ICD-10-CM | POA: Diagnosis not present

## 2021-11-04 LAB — BASIC METABOLIC PANEL
Anion gap: 8 (ref 5–15)
BUN: 11 mg/dL (ref 8–23)
CO2: 24 mmol/L (ref 22–32)
Calcium: 9.3 mg/dL (ref 8.9–10.3)
Chloride: 106 mmol/L (ref 98–111)
Creatinine, Ser: 0.89 mg/dL (ref 0.44–1.00)
GFR, Estimated: >60 mL/min (ref >60)
Glucose, Bld: 114 mg/dL — ABNORMAL HIGH (ref 70–99)
Potassium: 3.5 mmol/L (ref 3.5–5.1)
Sodium: 138 mmol/L (ref 135–145)

## 2021-11-04 LAB — LIPID PANEL
Cholesterol: 210 mg/dL — ABNORMAL HIGH (ref 0–200)
HDL: 63 mg/dL (ref >40)
LDL Cholesterol: 137 mg/dL — ABNORMAL HIGH (ref 0–99)
Total CHOL/HDL Ratio: 3.3 RATIO
Triglycerides: 52 mg/dL (ref <150)
VLDL: 10 mg/dL (ref 0–40)

## 2021-11-04 MED ORDER — AZITHROMYCIN 250 MG PO TABS: ORAL_TABLET | ORAL | 0 refills | Status: AC

## 2021-11-04 NOTE — Discharge Instructions (Addendum)
Dear [name], ? ?Thank you for letting us participate in your care. You were hospitalized for weakness and lightheadedness and diagnosed with Community acquired pneumonia. You also likely had a component of dehydration. You were treated with antibiotics. We are so glad that you are feeling better.   ? ?POST-HOSPITAL & CARE INSTRUCTIONS ?Your blood pressure was slightly elevated. No medication adjustments until you are seen in clinic, but please return if you develop chest pain, shortness of breath, vision changes or intolerable headache.  ?Finish your antibiotics (Azithromycin). You should take one pill tomorrow (4/3) and the last tablet on Tuesday 4/4.  ?Remember to hydrate well throughout the day! Especially now that the weather is warming up.  ?Go to your follow up appointments (listed below) ? ? ?DOCTOR'S APPOINTMENT   ?No future appointments. ? ? ?Take care and be well! ? ?Family Medicine Teaching Service Inpatient Team ?[place]  ?[facility]  ?[address] [place], [place] [address] ?[phone] ? ? ? ?

## 2021-11-04 NOTE — Discharge Summary (Addendum)
or surrounding inflammatory changes. Spleen: Normal in size without focal abnormality. Adrenals/Urinary Tract: Right pelvicaliectasis is unchanged from prior. No definitive obstructing calculus identified. Normal renal enhancement and excretion. Bladder within normal limits. Stomach/Bowel: There is a small hiatal hernia. The stomach is otherwise within normal limits. There is no bowel obstruction. The appendix is not seen. Vascular/Lymphatic: Aortic atherosclerosis. No enlarged abdominal or pelvic lymph nodes. IVC filter is present. Reproductive: There are calcifications in the uterus likely related to uterine fibroids. No adnexal Mass. Other: Small fat containing umbilical hernia. Surgical clips in the right inguinal region. No ascites. Musculoskeletal: No acute or significant osseous findings. 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 Review of the MIP images confirms the above findings. IMPRESSION: 1. No evidence for pulmonary embolism. 2. Patchy airspace opacities in the right lower lobe has slightly increased in the region of scarring. Findings are most likely related to infection/inflammation. Follow-up chest CT recommended in 3 months to re-evaluate. 3. Stable right-sided pelvicaliectasis without discrete hydronephrosis. 4. Fibroid uterus. 5. IVC filter. 6.  Aortic Atherosclerosis (ICD10-I70.0). Electronically Signed   By: Darliss Cheney M.D.   On: 11/02/2021 17:39   DG Chest Port 1 View  Result Date: 11/02/2021 CLINICAL DATA:  Syncope EXAM: PORTABLE CHEST 1 VIEW COMPARISON:  Chest x-ray 03/11/2021 FINDINGS: Heart size and mediastinal contours are within normal limits. No suspicious pulmonary opacities identified. No pleural effusion or pneumothorax visualized. No acute osseous abnormality appreciated. IMPRESSION: No acute intrathoracic process identified.  Electronically Signed   By: Jannifer Hick M.D.   On: 11/02/2021 14:42   VAS [place] LOWER EXTREMITY VENOUS (DVT)  Result Date: 11/03/2021  Lower Venous DVT Study Patient Name:  [name]  Date of Exam:   11/02/2021 Medical Rec #: [id number]        Accession #:    [id number] Date of Birth: [birth year]       Patient Gender: F Patient Age:   76 years Exam Location:  [facility] Procedure:      VAS [place] LOWER EXTREMITY VENOUS (DVT) Referring Phys: DAN FLOYD --------------------------------------------------------------------------------  Indications: Pain.  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 Hx of DVT. Anticoagulation: Xarelto. Limitations: Body habitus and poor ultrasound/tissue interface. Comparison Study: Previous exam on 05/18/2014 was negative for DVT. Performing Technologist: Ernestene Mention RVT, RDMS  Examination Guidelines: A complete evaluation includes B-mode imaging, spectral Doppler, color Doppler, and power Doppler as needed of all accessible portions of each vessel. Bilateral testing is considered an integral part of a complete examination. Limited examinations for reoccurring indications may be performed as noted. The reflux portion of the exam is performed with the patient in reverse Trendelenburg.  +---------+---------------+---------+-----------+----------+-------------------+ RIGHT    CompressibilityPhasicitySpontaneityPropertiesThrombus Aging      +---------+---------------+---------+-----------+----------+-------------------+ CFV      Full           Yes      Yes                                      +---------+---------------+---------+-----------+----------+-------------------+ SFJ      Full                                                             +---------+---------------+---------+-----------+----------+-------------------+  Family Medicine Teaching [facility] Discharge Summary  Patient name: [name] Medical record number: [id number] Date of birth: November 20, 1945 Age: 76 y.o. Gender: female Date of Admission: 11/02/2021  Date of Discharge: 11/04/21 Admitting Physician: Nestor Ramp, MD  Primary Care Provider: Latrelle Dodrill, MD Consultants: None  Indication for Hospitalization: Weakness, dizziness, CAP.   Discharge Diagnoses/Problem List:  Principal Problem:   Community acquired pneumonia Dehydration Hypertension   Disposition: Home  Discharge Condition: Stable  Discharge Exam:  Blood pressure (!) 178/78, pulse 86, temperature 98.4 F (36.9 C), temperature source Oral, resp. rate 15, height 5\' 4"  (1.626 m), weight 78.6 kg, SpO2 98 %.  General: Awake, alert, oriented, in no acute distress, pleasant and cooperative with examination HEENT: Normocephalic, atraumatic, nares patent, dentition is fair, MMM, oropharynx without erythema or exudates Cardio: RRR without murmur, 2+ radial, DP and PT pulses b/l Respiratory: CTAB without wheezing/rhonchi/rales, on room air  Extremities: without edema or cyanosis, fibrous and hard skin changes to distal lateral/posterior RLE  Psych: Normal mood and affect  Brief Hospital Course:  [name] is a 76 year-old female who presented with weakness/lightheadedness, admitted for hypoxia and presumed CAP. PMH significant for HTN, HLD, GERD, hx PE & DVT, OA, CKD3, sleep apnea. Hospital course is outlined below:  Hypoxia  concern for community-acquired pneumonia versus hypovolemic syncope Patient came in after she had episode of lightheadedness and weakness.  EMS reports she was diaphoretic, hypotensive, hypoxic to 70s. Mental status improved when presenting to ED. Received 1L NS bolus in ED. Hypoxia improved with 2L [place]. Workup negative for ACS, PE, stroke, hypoglycemia, anemia as CBC, troponin, BNP unremarkable. DVT U/S did show age indeterminate DVT  involving the right femoral vein and right popliteal vein. CTA negative for PE, although notable for patchy airspace opacities in the RLL most likely related to infection/inflammation. No signs of electrolyte abnormalities on BMP. Given opacities on CTA, pt treated with CTX x1 in ED, was continued on azithromycin orally and ceftriaxone. She remained afebrile and was weaned to room air on day 2 of hospitalization and maintained oxygen saturation with ambulation.  The patient's son reported that he had episodes of syncope related to hypovolemia and that the patient remained dehydrated due to caffeine intake through coffee and lack of water during the day.  She was discharged home on PO antibiotics to complete a 5 day course.   Right DVT  DVT U/S notable for age intermediate clot in right femoral vein and right popliteal vein. Patient has IVC filter in place and is on chronic anticoagulation with Xarelto. Most likely chronic in nature, did not make medication adjustments.   Mood Changes with poor PO intake Pt reported only eating 1 meal a day. Attributes this to some depression and social isolation. Likely contributed to weakness/lightheadedness prior to admission.  Patient likely needs long-term treatment with SSRI.  We will have her follow-up with PCP for this discussion.  Hypertension Continued on Amlodipine 5 mg throughout hospitalization. She had elevated pressures up to 195/91 on day of discharge but remained asymptomatic. No medication adjustments were made in the hospital but patient will have close clinic follow up for monitoring. Strict return precautions provided at discharge.   Other conditions (HLD, hx PE/DVT) chronic and stable. Home medications resumed.  PCP Follow-up items PHQ-9 at follow-up. Consider starting Mirtazepine to help with poor appetite and mood changes. Monitor weight closely in light of significant unintentional weight loss Ensure completion of antibiotics (End date:  4/4)  or surrounding inflammatory changes. Spleen: Normal in size without focal abnormality. Adrenals/Urinary Tract: Right pelvicaliectasis is unchanged from prior. No definitive obstructing calculus identified. Normal renal enhancement and excretion. Bladder within normal limits. Stomach/Bowel: There is a small hiatal hernia. The stomach is otherwise within normal limits. There is no bowel obstruction. The appendix is not seen. Vascular/Lymphatic: Aortic atherosclerosis. No enlarged abdominal or pelvic lymph nodes. IVC filter is present. Reproductive: There are calcifications in the uterus likely related to uterine fibroids. No adnexal Mass. Other: Small fat containing umbilical hernia. Surgical clips in the right inguinal region. No ascites. Musculoskeletal: No acute or significant osseous findings. 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 Review of the MIP images confirms the above findings. IMPRESSION: 1. No evidence for pulmonary embolism. 2. Patchy airspace opacities in the right lower lobe has slightly increased in the region of scarring. Findings are most likely related to infection/inflammation. Follow-up chest CT recommended in 3 months to re-evaluate. 3. Stable right-sided pelvicaliectasis without discrete hydronephrosis. 4. Fibroid uterus. 5. IVC filter. 6.  Aortic Atherosclerosis (ICD10-I70.0). Electronically Signed   By: Darliss Cheney M.D.   On: 11/02/2021 17:39   DG Chest Port 1 View  Result Date: 11/02/2021 CLINICAL DATA:  Syncope EXAM: PORTABLE CHEST 1 VIEW COMPARISON:  Chest x-ray 03/11/2021 FINDINGS: Heart size and mediastinal contours are within normal limits. No suspicious pulmonary opacities identified. No pleural effusion or pneumothorax visualized. No acute osseous abnormality appreciated. IMPRESSION: No acute intrathoracic process identified.  Electronically Signed   By: Jannifer Hick M.D.   On: 11/02/2021 14:42   VAS [place] LOWER EXTREMITY VENOUS (DVT)  Result Date: 11/03/2021  Lower Venous DVT Study Patient Name:  [name]  Date of Exam:   11/02/2021 Medical Rec #: [id number]        Accession #:    [id number] Date of Birth: [birth year]       Patient Gender: F Patient Age:   76 years Exam Location:  [facility] Procedure:      VAS [place] LOWER EXTREMITY VENOUS (DVT) Referring Phys: DAN FLOYD --------------------------------------------------------------------------------  Indications: Pain.  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 Hx of DVT. Anticoagulation: Xarelto. Limitations: Body habitus and poor ultrasound/tissue interface. Comparison Study: Previous exam on 05/18/2014 was negative for DVT. Performing Technologist: Ernestene Mention RVT, RDMS  Examination Guidelines: A complete evaluation includes B-mode imaging, spectral Doppler, color Doppler, and power Doppler as needed of all accessible portions of each vessel. Bilateral testing is considered an integral part of a complete examination. Limited examinations for reoccurring indications may be performed as noted. The reflux portion of the exam is performed with the patient in reverse Trendelenburg.  +---------+---------------+---------+-----------+----------+-------------------+ RIGHT    CompressibilityPhasicitySpontaneityPropertiesThrombus Aging      +---------+---------------+---------+-----------+----------+-------------------+ CFV      Full           Yes      Yes                                      +---------+---------------+---------+-----------+----------+-------------------+ SFJ      Full                                                             +---------+---------------+---------+-----------+----------+-------------------+  Family Medicine Teaching [facility] Discharge Summary  Patient name: [name] Medical record number: [id number] Date of birth: November 20, 1945 Age: 76 y.o. Gender: female Date of Admission: 11/02/2021  Date of Discharge: 11/04/21 Admitting Physician: Nestor Ramp, MD  Primary Care Provider: Latrelle Dodrill, MD Consultants: None  Indication for Hospitalization: Weakness, dizziness, CAP.   Discharge Diagnoses/Problem List:  Principal Problem:   Community acquired pneumonia Dehydration Hypertension   Disposition: Home  Discharge Condition: Stable  Discharge Exam:  Blood pressure (!) 178/78, pulse 86, temperature 98.4 F (36.9 C), temperature source Oral, resp. rate 15, height 5\' 4"  (1.626 m), weight 78.6 kg, SpO2 98 %.  General: Awake, alert, oriented, in no acute distress, pleasant and cooperative with examination HEENT: Normocephalic, atraumatic, nares patent, dentition is fair, MMM, oropharynx without erythema or exudates Cardio: RRR without murmur, 2+ radial, DP and PT pulses b/l Respiratory: CTAB without wheezing/rhonchi/rales, on room air  Extremities: without edema or cyanosis, fibrous and hard skin changes to distal lateral/posterior RLE  Psych: Normal mood and affect  Brief Hospital Course:  [name] is a 76 year-old female who presented with weakness/lightheadedness, admitted for hypoxia and presumed CAP. PMH significant for HTN, HLD, GERD, hx PE & DVT, OA, CKD3, sleep apnea. Hospital course is outlined below:  Hypoxia  concern for community-acquired pneumonia versus hypovolemic syncope Patient came in after she had episode of lightheadedness and weakness.  EMS reports she was diaphoretic, hypotensive, hypoxic to 70s. Mental status improved when presenting to ED. Received 1L NS bolus in ED. Hypoxia improved with 2L [place]. Workup negative for ACS, PE, stroke, hypoglycemia, anemia as CBC, troponin, BNP unremarkable. DVT U/S did show age indeterminate DVT  involving the right femoral vein and right popliteal vein. CTA negative for PE, although notable for patchy airspace opacities in the RLL most likely related to infection/inflammation. No signs of electrolyte abnormalities on BMP. Given opacities on CTA, pt treated with CTX x1 in ED, was continued on azithromycin orally and ceftriaxone. She remained afebrile and was weaned to room air on day 2 of hospitalization and maintained oxygen saturation with ambulation.  The patient's son reported that he had episodes of syncope related to hypovolemia and that the patient remained dehydrated due to caffeine intake through coffee and lack of water during the day.  She was discharged home on PO antibiotics to complete a 5 day course.   Right DVT  DVT U/S notable for age intermediate clot in right femoral vein and right popliteal vein. Patient has IVC filter in place and is on chronic anticoagulation with Xarelto. Most likely chronic in nature, did not make medication adjustments.   Mood Changes with poor PO intake Pt reported only eating 1 meal a day. Attributes this to some depression and social isolation. Likely contributed to weakness/lightheadedness prior to admission.  Patient likely needs long-term treatment with SSRI.  We will have her follow-up with PCP for this discussion.  Hypertension Continued on Amlodipine 5 mg throughout hospitalization. She had elevated pressures up to 195/91 on day of discharge but remained asymptomatic. No medication adjustments were made in the hospital but patient will have close clinic follow up for monitoring. Strict return precautions provided at discharge.   Other conditions (HLD, hx PE/DVT) chronic and stable. Home medications resumed.  PCP Follow-up items PHQ-9 at follow-up. Consider starting Mirtazepine to help with poor appetite and mood changes. Monitor weight closely in light of significant unintentional weight loss Ensure completion of antibiotics (End date:  4/4)  Family Medicine Teaching [facility] Discharge Summary  Patient name: [name] Medical record number: [id number] Date of birth: November 20, 1945 Age: 76 y.o. Gender: female Date of Admission: 11/02/2021  Date of Discharge: 11/04/21 Admitting Physician: Nestor Ramp, MD  Primary Care Provider: Latrelle Dodrill, MD Consultants: None  Indication for Hospitalization: Weakness, dizziness, CAP.   Discharge Diagnoses/Problem List:  Principal Problem:   Community acquired pneumonia Dehydration Hypertension   Disposition: Home  Discharge Condition: Stable  Discharge Exam:  Blood pressure (!) 178/78, pulse 86, temperature 98.4 F (36.9 C), temperature source Oral, resp. rate 15, height 5\' 4"  (1.626 m), weight 78.6 kg, SpO2 98 %.  General: Awake, alert, oriented, in no acute distress, pleasant and cooperative with examination HEENT: Normocephalic, atraumatic, nares patent, dentition is fair, MMM, oropharynx without erythema or exudates Cardio: RRR without murmur, 2+ radial, DP and PT pulses b/l Respiratory: CTAB without wheezing/rhonchi/rales, on room air  Extremities: without edema or cyanosis, fibrous and hard skin changes to distal lateral/posterior RLE  Psych: Normal mood and affect  Brief Hospital Course:  [name] is a 76 year-old female who presented with weakness/lightheadedness, admitted for hypoxia and presumed CAP. PMH significant for HTN, HLD, GERD, hx PE & DVT, OA, CKD3, sleep apnea. Hospital course is outlined below:  Hypoxia  concern for community-acquired pneumonia versus hypovolemic syncope Patient came in after she had episode of lightheadedness and weakness.  EMS reports she was diaphoretic, hypotensive, hypoxic to 70s. Mental status improved when presenting to ED. Received 1L NS bolus in ED. Hypoxia improved with 2L [place]. Workup negative for ACS, PE, stroke, hypoglycemia, anemia as CBC, troponin, BNP unremarkable. DVT U/S did show age indeterminate DVT  involving the right femoral vein and right popliteal vein. CTA negative for PE, although notable for patchy airspace opacities in the RLL most likely related to infection/inflammation. No signs of electrolyte abnormalities on BMP. Given opacities on CTA, pt treated with CTX x1 in ED, was continued on azithromycin orally and ceftriaxone. She remained afebrile and was weaned to room air on day 2 of hospitalization and maintained oxygen saturation with ambulation.  The patient's son reported that he had episodes of syncope related to hypovolemia and that the patient remained dehydrated due to caffeine intake through coffee and lack of water during the day.  She was discharged home on PO antibiotics to complete a 5 day course.   Right DVT  DVT U/S notable for age intermediate clot in right femoral vein and right popliteal vein. Patient has IVC filter in place and is on chronic anticoagulation with Xarelto. Most likely chronic in nature, did not make medication adjustments.   Mood Changes with poor PO intake Pt reported only eating 1 meal a day. Attributes this to some depression and social isolation. Likely contributed to weakness/lightheadedness prior to admission.  Patient likely needs long-term treatment with SSRI.  We will have her follow-up with PCP for this discussion.  Hypertension Continued on Amlodipine 5 mg throughout hospitalization. She had elevated pressures up to 195/91 on day of discharge but remained asymptomatic. No medication adjustments were made in the hospital but patient will have close clinic follow up for monitoring. Strict return precautions provided at discharge.   Other conditions (HLD, hx PE/DVT) chronic and stable. Home medications resumed.  PCP Follow-up items PHQ-9 at follow-up. Consider starting Mirtazepine to help with poor appetite and mood changes. Monitor weight closely in light of significant unintentional weight loss Ensure completion of antibiotics (End date:  4/4)  Family Medicine Teaching [facility] Discharge Summary  Patient name: [name] Medical record number: [id number] Date of birth: November 20, 1945 Age: 76 y.o. Gender: female Date of Admission: 11/02/2021  Date of Discharge: 11/04/21 Admitting Physician: Nestor Ramp, MD  Primary Care Provider: Latrelle Dodrill, MD Consultants: None  Indication for Hospitalization: Weakness, dizziness, CAP.   Discharge Diagnoses/Problem List:  Principal Problem:   Community acquired pneumonia Dehydration Hypertension   Disposition: Home  Discharge Condition: Stable  Discharge Exam:  Blood pressure (!) 178/78, pulse 86, temperature 98.4 F (36.9 C), temperature source Oral, resp. rate 15, height 5\' 4"  (1.626 m), weight 78.6 kg, SpO2 98 %.  General: Awake, alert, oriented, in no acute distress, pleasant and cooperative with examination HEENT: Normocephalic, atraumatic, nares patent, dentition is fair, MMM, oropharynx without erythema or exudates Cardio: RRR without murmur, 2+ radial, DP and PT pulses b/l Respiratory: CTAB without wheezing/rhonchi/rales, on room air  Extremities: without edema or cyanosis, fibrous and hard skin changes to distal lateral/posterior RLE  Psych: Normal mood and affect  Brief Hospital Course:  [name] is a 76 year-old female who presented with weakness/lightheadedness, admitted for hypoxia and presumed CAP. PMH significant for HTN, HLD, GERD, hx PE & DVT, OA, CKD3, sleep apnea. Hospital course is outlined below:  Hypoxia  concern for community-acquired pneumonia versus hypovolemic syncope Patient came in after she had episode of lightheadedness and weakness.  EMS reports she was diaphoretic, hypotensive, hypoxic to 70s. Mental status improved when presenting to ED. Received 1L NS bolus in ED. Hypoxia improved with 2L [place]. Workup negative for ACS, PE, stroke, hypoglycemia, anemia as CBC, troponin, BNP unremarkable. DVT U/S did show age indeterminate DVT  involving the right femoral vein and right popliteal vein. CTA negative for PE, although notable for patchy airspace opacities in the RLL most likely related to infection/inflammation. No signs of electrolyte abnormalities on BMP. Given opacities on CTA, pt treated with CTX x1 in ED, was continued on azithromycin orally and ceftriaxone. She remained afebrile and was weaned to room air on day 2 of hospitalization and maintained oxygen saturation with ambulation.  The patient's son reported that he had episodes of syncope related to hypovolemia and that the patient remained dehydrated due to caffeine intake through coffee and lack of water during the day.  She was discharged home on PO antibiotics to complete a 5 day course.   Right DVT  DVT U/S notable for age intermediate clot in right femoral vein and right popliteal vein. Patient has IVC filter in place and is on chronic anticoagulation with Xarelto. Most likely chronic in nature, did not make medication adjustments.   Mood Changes with poor PO intake Pt reported only eating 1 meal a day. Attributes this to some depression and social isolation. Likely contributed to weakness/lightheadedness prior to admission.  Patient likely needs long-term treatment with SSRI.  We will have her follow-up with PCP for this discussion.  Hypertension Continued on Amlodipine 5 mg throughout hospitalization. She had elevated pressures up to 195/91 on day of discharge but remained asymptomatic. No medication adjustments were made in the hospital but patient will have close clinic follow up for monitoring. Strict return precautions provided at discharge.   Other conditions (HLD, hx PE/DVT) chronic and stable. Home medications resumed.  PCP Follow-up items PHQ-9 at follow-up. Consider starting Mirtazepine to help with poor appetite and mood changes. Monitor weight closely in light of significant unintentional weight loss Ensure completion of antibiotics (End date:  4/4)  or surrounding inflammatory changes. Spleen: Normal in size without focal abnormality. Adrenals/Urinary Tract: Right pelvicaliectasis is unchanged from prior. No definitive obstructing calculus identified. Normal renal enhancement and excretion. Bladder within normal limits. Stomach/Bowel: There is a small hiatal hernia. The stomach is otherwise within normal limits. There is no bowel obstruction. The appendix is not seen. Vascular/Lymphatic: Aortic atherosclerosis. No enlarged abdominal or pelvic lymph nodes. IVC filter is present. Reproductive: There are calcifications in the uterus likely related to uterine fibroids. No adnexal Mass. Other: Small fat containing umbilical hernia. Surgical clips in the right inguinal region. No ascites. Musculoskeletal: No acute or significant osseous findings. 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 Review of the MIP images confirms the above findings. IMPRESSION: 1. No evidence for pulmonary embolism. 2. Patchy airspace opacities in the right lower lobe has slightly increased in the region of scarring. Findings are most likely related to infection/inflammation. Follow-up chest CT recommended in 3 months to re-evaluate. 3. Stable right-sided pelvicaliectasis without discrete hydronephrosis. 4. Fibroid uterus. 5. IVC filter. 6.  Aortic Atherosclerosis (ICD10-I70.0). Electronically Signed   By: Darliss Cheney M.D.   On: 11/02/2021 17:39   DG Chest Port 1 View  Result Date: 11/02/2021 CLINICAL DATA:  Syncope EXAM: PORTABLE CHEST 1 VIEW COMPARISON:  Chest x-ray 03/11/2021 FINDINGS: Heart size and mediastinal contours are within normal limits. No suspicious pulmonary opacities identified. No pleural effusion or pneumothorax visualized. No acute osseous abnormality appreciated. IMPRESSION: No acute intrathoracic process identified.  Electronically Signed   By: Jannifer Hick M.D.   On: 11/02/2021 14:42   VAS [place] LOWER EXTREMITY VENOUS (DVT)  Result Date: 11/03/2021  Lower Venous DVT Study Patient Name:  [name]  Date of Exam:   11/02/2021 Medical Rec #: [id number]        Accession #:    [id number] Date of Birth: [birth year]       Patient Gender: F Patient Age:   76 years Exam Location:  [facility] Procedure:      VAS [place] LOWER EXTREMITY VENOUS (DVT) Referring Phys: DAN FLOYD --------------------------------------------------------------------------------  Indications: Pain.  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 Hx of DVT. Anticoagulation: Xarelto. Limitations: Body habitus and poor ultrasound/tissue interface. Comparison Study: Previous exam on 05/18/2014 was negative for DVT. Performing Technologist: Ernestene Mention RVT, RDMS  Examination Guidelines: A complete evaluation includes B-mode imaging, spectral Doppler, color Doppler, and power Doppler as needed of all accessible portions of each vessel. Bilateral testing is considered an integral part of a complete examination. Limited examinations for reoccurring indications may be performed as noted. The reflux portion of the exam is performed with the patient in reverse Trendelenburg.  +---------+---------------+---------+-----------+----------+-------------------+ RIGHT    CompressibilityPhasicitySpontaneityPropertiesThrombus Aging      +---------+---------------+---------+-----------+----------+-------------------+ CFV      Full           Yes      Yes                                      +---------+---------------+---------+-----------+----------+-------------------+ SFJ      Full                                                             +---------+---------------+---------+-----------+----------+-------------------+

## 2021-11-04 NOTE — Progress Notes (Signed)
PT BP elevated. Provider paged. ?

## 2021-11-04 NOTE — Progress Notes (Signed)
Patient discharged: Home with family  Via: Wheelchair   Discharge paperwork given: to patient and family  Reviewed with teach back  IV and telemetry disconnected  Belongings given to patient    

## 2021-11-06 ENCOUNTER — Encounter: Payer: Self-pay | Admitting: Student

## 2021-11-06 ENCOUNTER — Other Ambulatory Visit: Payer: Self-pay

## 2021-11-06 ENCOUNTER — Ambulatory Visit (INDEPENDENT_AMBULATORY_CARE_PROVIDER_SITE_OTHER): Payer: Medicare HMO | Admitting: Student

## 2021-11-06 VITALS — BP 133/71 | HR 86 | Wt 176.8 lb

## 2021-11-06 DIAGNOSIS — J189 Pneumonia, unspecified organism: Secondary | ICD-10-CM | POA: Diagnosis not present

## 2021-11-06 DIAGNOSIS — R55 Syncope and collapse: Secondary | ICD-10-CM | POA: Diagnosis not present

## 2021-11-06 DIAGNOSIS — M79604 Pain in right leg: Secondary | ICD-10-CM | POA: Diagnosis not present

## 2021-11-06 DIAGNOSIS — I82401 Acute embolism and thrombosis of unspecified deep veins of right lower extremity: Secondary | ICD-10-CM | POA: Diagnosis not present

## 2021-11-06 NOTE — Assessment & Plan Note (Signed)
Pt has IVC ?-Continue Xarelto ?

## 2021-11-06 NOTE — Progress Notes (Signed)
? ? ?  SUBJECTIVE:  ? ?CHIEF COMPLAINT / HPI: CAP ? ?PNA ?Pt hospitalized from 3/31-11/04/21.  Was treated with supplemental oxygen, azithromycin and ceftriaxone and was discharged to complete a total of 5 days of antibiotics. Will finish last dose today. Feels back to normal in terms of her breathing.  ? ?DVT ?Patient found to have right DVT in right femoral vein and right popliteal vein.  Patient has IVC filter in place and is on Xarelto.  No adjustments in medications were made ? ?Mood changes ?While in the hospital son mentioned symptoms of depression.  Not currently on SSRI. Son states he thinks her decreased mood was d/t not having a car for transportation for a period of time and unable to get out of the house. Now car is running and he feels her mood has increased. Today, pt states she does not feel depressed ? ?Right leg pain ?Has had right leg pain for ever a week, took tylenol for pain in hospital which helps. She describes pain as ache, like a tooth ache from calf muscle to mid thigh. Denies any injury.  ? ?Hx of syncope ?Son states pt has had multiple presyncopal/syncopal episodes and has been thoroughly evaluated at ED by cardiologist, has had orthostatic vitals checked several times. ? ? ?PERTINENT  PMH / PSH: DVT, recurrent PEs, osteoarthritis, syncope ? ?OBJECTIVE:  ? ?Vitals:  ? 11/06/21 1156 11/06/21 1157  ?BP: 133/71 133/71  ?Pulse:    ?SpO2:    ? ? ? ?General: NAD, pleasant, able to participate in exam ?Cardiac: RRR, no murmurs. ?Respiratory: CTAB, normal effort, No wheezes, rales or rhonchi ?Abdomen: Bowel sounds present, nontender, nondistended, soft ?Extremities: no edema, mild tenderness to palpation of right calf muscle ?Neuro: alert, no obvious focal deficits ?Psych: Normal affect and mood ? ?ASSESSMENT/PLAN:  ? ?Community acquired pneumonia ?-Complete course of antibiotics today ? 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 ?Right leg pain ?Likely due  to DVT. ?-Continue Tylenol as needed ?-Return if pain worsens or persists for more than a couple weeks ?  ?Hx of Syncope and collapse ?Patient been thoroughly evaluated by cardiologist and had orthostatic vitals taken several times.  Orthostatic vitals in hospital were negative for orthostatic hypotension ?-Patient advised to move slowly when going from lying to sitting to standing and to make sure pathways in her home are clear to reduce risk of falls ? ? ?Dr. Precious Gilding, DO ?[place]  ? ? ? ? ?

## 2021-11-06 NOTE — Patient Instructions (Signed)
It was great to see you! Thank you for allowing me to participate in your care! ? ?I recommend that you always bring your medications to each appointment as this makes it easy to ensure you are on the correct medications and helps [place] not miss when refills are needed. ? ?Our plans for today:  ?- Finish your course of antibiotics ?- Continue tylenol for leg pain. If pain worsens, is not relieved by tylenol or persists for more than a couple weeks, please return for further evaluation ?-move slowly when going from lying to sitting to standing. Always be sure you have something to hold on to. Be sure to have clear pathways throughout your house to reduce possibility of falls.  ? ?Take care and seek immediate care sooner if you develop any concerns.  ? ?Dr. Precious Gilding, DO ?[facility] ? ?

## 2021-11-06 NOTE — Assessment & Plan Note (Signed)
Likely due to DVT. ?-Continue Tylenol as needed ?-Return if pain worsens or persists for more than a couple weeks ?

## 2021-11-06 NOTE — Assessment & Plan Note (Signed)
-  Patient advised to move slowly when going from lying to sitting to standing and to make sure pathways in her home are clear to reduce risk of falls ?

## 2021-11-06 NOTE — Assessment & Plan Note (Signed)
-  Complete course of antibiotics today ?

## 2021-11-07 LAB — CULTURE, BLOOD (ROUTINE X 2)
Culture: NO GROWTH
Culture: NO GROWTH
Special Requests: ADEQUATE

## 2021-11-24 ENCOUNTER — Other Ambulatory Visit: Payer: Self-pay | Admitting: Family Medicine

## 2021-11-27 ENCOUNTER — Other Ambulatory Visit: Payer: Self-pay | Admitting: Family Medicine

## 2021-11-27 NOTE — Telephone Encounter (Signed)
Please let patient know I am refilling this medication, but she needs to schedule an appointment with me.   Thanks, Estevon Fluke J Mena Lienau, MD  

## 2021-12-05 ENCOUNTER — Telehealth: Payer: Self-pay | Admitting: Family Medicine

## 2021-12-05 NOTE — Telephone Encounter (Signed)
Letter written, will place in fax outgoing pile ?Admin team, please call patient's son and let him know this request has been completed ? ?Thank you! ?Leeanne Rio, MD  ?

## 2021-12-05 NOTE — Telephone Encounter (Signed)
Received call from patient's son Willa Rough that they need a letter faxed to [facility] listing patient's medical conditions so they can have additional in-home assistance for the special needs daughter patient adopted. ? ?Contact: Reita Cliche, phone # [phone] ? ?Contacted Shada and got fax number and what she needs. I have provided similar letters in the past. Will take care of this and fax. (Fax # [phone]) ? ?Leeanne Rio, MD  ?

## 2021-12-18 ENCOUNTER — Other Ambulatory Visit: Payer: Self-pay

## 2021-12-18 ENCOUNTER — Ambulatory Visit (INDEPENDENT_AMBULATORY_CARE_PROVIDER_SITE_OTHER): Payer: Medicare HMO | Admitting: Family Medicine

## 2021-12-18 ENCOUNTER — Encounter: Payer: Self-pay | Admitting: Family Medicine

## 2021-12-18 VITALS — BP 121/77 | HR 66 | Ht 64.0 in | Wt 174.0 lb

## 2021-12-18 DIAGNOSIS — R634 Abnormal weight loss: Secondary | ICD-10-CM | POA: Diagnosis not present

## 2021-12-18 DIAGNOSIS — R9389 Abnormal findings on diagnostic imaging of other specified body structures: Secondary | ICD-10-CM | POA: Diagnosis not present

## 2021-12-18 DIAGNOSIS — I10 Essential (primary) hypertension: Secondary | ICD-10-CM

## 2021-12-18 MED ORDER — SHINGRIX 50 MCG/0.5ML IM SUSR: INTRAMUSCULAR | 1 refills | Status: AC

## 2021-12-18 NOTE — Patient Instructions (Signed)
It was great to see you again today! ? ?Follow up in June as scheduled for memory evaluation ?Scheduling CT scan for July ?Take shingles vaccine prescription to your pharmacy ? ?Be well, ?Dr. Ardelia Mems ?
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
## 2021-12-18 NOTE — Assessment & Plan Note (Signed)
Weight continues to trend down, she has lost 17 lbs since April 2022 without significant lifestyle changes endorsed.  ?- Per available records, she has never had a colonoscopy, and cancelled one in December 2022  ?- Concern for underlying condition causing unintentional weight loss, counseled patient and her son, advised to reschedule colonoscopy.  ?

## 2021-12-18 NOTE — Assessment & Plan Note (Signed)
Well controlled on current regimen with no concern for hypotensive episodes from over treatment. Continue current regimen.  ?

## 2022-01-04 ENCOUNTER — Ambulatory Visit (HOSPITAL_COMMUNITY)
Admission: RE | Admit: 2022-01-04 | Discharge: 2022-01-04 | Disposition: A | Discharge status: Home / Self Care | Payer: Medicare HMO | Source: Ambulatory Visit | Attending: Family Medicine | Admitting: Family Medicine

## 2022-01-04 DIAGNOSIS — R9389 Abnormal findings on diagnostic imaging of other specified body structures: Secondary | ICD-10-CM | POA: Diagnosis not present

## 2022-01-07 ENCOUNTER — Encounter: Payer: Self-pay | Admitting: Family Medicine

## 2022-01-08 ENCOUNTER — Encounter: Payer: Self-pay | Admitting: *Deleted

## 2022-01-10 ENCOUNTER — Other Ambulatory Visit: Payer: Self-pay | Admitting: Family Medicine

## 2022-01-10 ENCOUNTER — Ambulatory Visit: Payer: Medicare HMO | Admitting: Family Medicine

## 2022-02-14 ENCOUNTER — Ambulatory Visit (INDEPENDENT_AMBULATORY_CARE_PROVIDER_SITE_OTHER): Payer: Medicare HMO | Admitting: Family Medicine

## 2022-02-14 ENCOUNTER — Encounter: Payer: Self-pay | Admitting: Family Medicine

## 2022-02-14 VITALS — BP 149/62 | HR 65 | Ht 64.0 in | Wt 178.4 lb

## 2022-02-14 DIAGNOSIS — R413 Other amnesia: Secondary | ICD-10-CM

## 2022-02-14 DIAGNOSIS — R634 Abnormal weight loss: Secondary | ICD-10-CM

## 2022-02-14 NOTE — Patient Instructions (Signed)
It was great to see you again today!  Follow up in 3 months to recheck weight  Call to schedule colonoscopy  Be well, Dr. Ardelia Mems

## 2022-02-14 NOTE — Assessment & Plan Note (Addendum)
Patient has no weight loss since last visit. Weight is stable. Encourage meals and water intake. Will continue to monitor.

## 2022-02-14 NOTE — Assessment & Plan Note (Addendum)
MOCA is 18/30 today, so does have some mild cognitive impairment. She specifically did very well on serial 7 subtractions - was able to do this very quickly (fastest I have ever seen a patient perform it). Will monitor cognitive function over time. No adjustments to medications or other workup planned at this time.
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
## 2022-02-18 ENCOUNTER — Other Ambulatory Visit: Payer: Self-pay | Admitting: Family Medicine

## 2022-02-19 ENCOUNTER — Other Ambulatory Visit: Payer: Self-pay | Admitting: Family Medicine

## 2022-02-21 NOTE — Telephone Encounter (Signed)
Patient's son calls nurse line to check status of rx refill.   Talbot Grumbling, RN

## 2022-05-08 ENCOUNTER — Other Ambulatory Visit: Payer: Self-pay | Admitting: Family Medicine

## 2022-06-17 ENCOUNTER — Ambulatory Visit: Payer: Medicare Other | Admitting: Family Medicine

## 2022-07-04 ENCOUNTER — Ambulatory Visit (INDEPENDENT_AMBULATORY_CARE_PROVIDER_SITE_OTHER): Payer: Medicare HMO | Admitting: Family Medicine

## 2022-07-04 ENCOUNTER — Other Ambulatory Visit: Payer: Self-pay

## 2022-07-04 ENCOUNTER — Encounter: Payer: Self-pay | Admitting: Family Medicine

## 2022-07-04 VITALS — BP 128/78 | HR 74 | Wt 173.6 lb

## 2022-07-04 DIAGNOSIS — R413 Other amnesia: Secondary | ICD-10-CM

## 2022-07-04 DIAGNOSIS — I10 Essential (primary) hypertension: Secondary | ICD-10-CM

## 2022-07-04 DIAGNOSIS — R634 Abnormal weight loss: Secondary | ICD-10-CM | POA: Diagnosis not present

## 2022-07-04 DIAGNOSIS — I82409 Acute embolism and thrombosis of unspecified deep veins of unspecified lower extremity: Secondary | ICD-10-CM | POA: Diagnosis not present

## 2022-07-04 NOTE — Patient Instructions (Signed)
It was great to see you again today!  Stay on current medications Follow up in April, sooner if needed We will try to reach back out about scheduling Annual Wellness Visit   Be well, Dr. Ardelia Mems
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
## 2022-07-04 NOTE — Assessment & Plan Note (Addendum)
Patient has weight loss of 5 lbs since July,  but weighs only a pound less than in March. Weight is overall stable this year and 170-180 appears to be the patient's new normal.  Will continue to monitor, but stability of weight and lack of red flag symptoms for malignancy is reassuring. Reasonable to forego colonoscopy, discussed with patient and son that we could be missing a colon malignancy by not doing colonoscopy, but they accept that risk.

## 2022-07-04 NOTE — Assessment & Plan Note (Addendum)
Well controlled on recheck today.  Patient taking amlodipine 5 mg daily and stable on current dose.  No changes to regimen necessary at this time.  Denies any shortness of breath, chest pain, vision changes, LE edema, or symptoms of hypotension.

## 2022-07-04 NOTE — Assessment & Plan Note (Addendum)
Unchanged from last visit, no new concerns.  No new workup necessary at this time.  Continue to monitor.

## 2022-07-05 NOTE — Assessment & Plan Note (Signed)
Doing well on xarelto, continue

## 2022-08-01 ENCOUNTER — Other Ambulatory Visit: Payer: Self-pay | Admitting: Family Medicine
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
## 2022-08-20 ENCOUNTER — Other Ambulatory Visit: Payer: Self-pay | Admitting: Family Medicine

## 2022-08-20 DIAGNOSIS — R928 Other abnormal and inconclusive findings on diagnostic imaging of breast: Secondary | ICD-10-CM

## 2022-09-02 ENCOUNTER — Telehealth: Payer: Self-pay | Admitting: Family Medicine

## 2022-09-02 NOTE — Telephone Encounter (Signed)
Left message for patient to call back and schedule Medicare Annual Wellness Visit (AWV).   Please offer to do virtually or by telephone.  Left office number and my jabber [phone].  Last AWV:10/05/2020   Please schedule at anytime with Nurse Health Advisor.

## 2022-09-13 ENCOUNTER — Ambulatory Visit (INDEPENDENT_AMBULATORY_CARE_PROVIDER_SITE_OTHER): Payer: Medicare PPO

## 2022-09-13 DIAGNOSIS — Z0000 Encounter for general adult medical examination without abnormal findings: Secondary | ICD-10-CM

## 2022-09-13 NOTE — Progress Notes (Signed)
I connected with  [name] on 09/13/22 by a audio enabled telemedicine application and verified that I am speaking with the correct person using two identifiers.  Patient Location: Home  Provider Location: Office/Clinic  I discussed the limitations of evaluation and management by telemedicine. The patient expressed understanding and agreed to proceed.  Subjective:   [name] is a 77 y.o. female who presents for Medicare Annual (Subsequent) preventive examination.  Review of Systems    Per HPI unless specifically indicated below.  Cardiac Risk Factors include: advanced age (>49mn, >>76women);female gender, and hypertension.          Objective:       07/04/2022   10:49 AM 07/04/2022   10:27 AM 02/14/2022   12:01 PM  Vitals with BMI  Height   5' 4"$   Weight  173 lbs 10 oz 178 lbs 6 oz  BMI   [id number] Systolic [id number] Diastolic 78 75 62  Pulse  74 65    There were no vitals filed for this visit. There is no height or weight on file to calculate BMI.     07/04/2022   10:28 AM 02/14/2022    1:47 PM 12/18/2021   12:17 PM 11/06/2021   11:03 AM 11/02/2021    8:20 PM 11/02/2021    2:56 PM 06/04/2021    2:23 PM  Advanced Directives  Does Patient Have a Medical Advance Directive? No No No No No No No  Would patient like information on creating a medical advance directive? No - Patient declined  No - Patient declined No - Patient declined No - Patient declined No - Patient declined No - Patient declined    Current Medications (verified) Outpatient Encounter Medications as of 09/13/2022  Medication Sig   acetaminophen (TYLENOL) 650 MG CR tablet Take 1,300 mg by mouth every 8 (eight) hours as needed for pain.   amLODipine (NORVASC) 5 MG tablet TAKE 1 TABLET BY MOUTH EVERYDAY AT BEDTIME   ferrous sulfate 325 (65 FE) MG tablet TAKE 1 TABLET BY MOUTH EVERY OTHER DAY   XARELTO 20 MG TABS tablet TAKE 1 TABLET BY MOUTH EVERY DAY   Zoster Vaccine Adjuvanted ([facility]  injection Administer Shingrix vaccination now and repeat in two months (Patient not taking: Reported on 09/13/2022)   No facility-administered encounter medications on file as of 09/13/2022.    Allergies (verified) Patient has no known allergies.   History: Past Medical History:  Diagnosis Date   Ambulates with cane    Anemia    hx of yrs ago   Anemia due to acute blood loss 10/10/2020   ANEMIA, OTHER, UNSPECIFIED 10/02/2006   Pt taking iron supplement.  Last cbc showed normal hgb.  Will recheck today.  If normal can d/c iron supplement.      Anticoagulant-induced bleeding ([facility] 10/09/2020   Arm numbness 01/27/2013   [place]/TIA work up on 6/25-6/26 does not show any evidence of stroke on MRI, carotid dopplers normal.  Would consider cervical myelopathy as next best potential diagnosis, and neck MRI may be useful next step   DJD (degenerative joint disease) of hip    Ganglion cyst 02/14/2013   GERD (gastroesophageal reflux disease)    h/o in the 1990s   Heart murmur    mild no cardiologist   Hyperlipidemia    Hypertension    sees Dr. 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  had retroperiotneal hemorrahge with IVC filter placement after recurrent PE/DVT   Recurrent pulmonary embolism [facility])    see pulmonologist Dr. Asencion Noble   Sleep apnea    sleep study 2017 no cpap given   VTE (venous thromboembolism) 08/24/2009   Has h/o DVT and PE.  Pt on lifelong anticoagulation, currently on xarelto.  Also has IVC filter.    Wears glasses for reading   Wears partial dentures upper   Past Surgical  History:  Procedure Laterality Date   BREAST BIOPSY Right 10/17/2020   ductal papilloma   CARPAL TUNNEL RELEASE  09/02/2012   Procedure: CARPAL TUNNEL RELEASE;  Surgeon: Winfield Cunas, MD;  Location: [place] [facility];  Service: Neurosurgery;  Laterality: Right;  RIGHT carpal tunnel release   DILATION AND CURETTAGE OF UTERUS  02/21/2012   Procedure: DILATATION AND CURETTAGE;  Surgeon: Emily Filbert, MD;  Location: [place] [facility];  Service: Gynecology;  Laterality: N/A;   Femoral vein ligation  12/2002   [place] filter  11/03/2003   R Jugular   HYSTEROSCOPY WITH D & C N/A 10/03/2020   Procedure: DILATATION AND CURETTAGE /HYSTEROSCOPY WITH MYOSURE;  Surgeon: Mora Bellman, MD;  Location: [place];  Service: Gynecology;  Laterality: N/A;   SHOULDER SURGERY  Left 07/15/08   Dr Hal Morales   TOTAL HIP ARTHROPLASTY  R 05/2203, L 2010   TRANSTHORACIC ECHOCARDIOGRAM     EF 55-60%, RV mod dilated, mildly increased LV wall thickness   TUBAL LIGATION  1981   Family History  Problem Relation Age of Onset   Heart disease Mother    Heart attack Mother    Heart disease Father    Heart attack Father    Cancer Sister    Alcohol abuse Sister    Cancer Brother    Colon cancer Neg Hx    Colon polyps Neg Hx    Esophageal cancer Neg Hx    Rectal cancer Neg Hx    Stomach cancer Neg Hx    Social History   Socioeconomic History   Marital status: Widowed    Spouse name: Not on file   Number of children: 4   Years of education: College   Highest education level: Associate degree: occupational, Hotel manager, or vocational program  Occupational History    Employer: RETIRED  Tobacco Use   Smoking status: Never    Passive exposure: Never   Smokeless tobacco: Never  Vaping Use   Vaping Use: Never used  Substance and Sexual Activity   Alcohol use: No   Drug use: No   Sexual activity: Not Currently    Birth control/protection: Post-menopausal  Other Topics Concern   Not on file  Social  History Narrative   Patient lives with her son and adopted daughter with special needs.    Patient is active in her church and with her family.   Patient enjoys sewing, crocheting, cooking, and spending time with family.    Patient has 2 biological sons and 2 adopted daughters.          Social Determinants of Health   Financial Resource Strain: Low Risk  (09/13/2022)  Overall Financial Resource Strain (CARDIA)    Difficulty of Paying Living Expenses: Not hard at all  Food Insecurity: No Food Insecurity (09/13/2022)   Hunger Vital Sign    Worried About Running Out of Food in the Last Year: Never true    Ran Out of Food in the Last Year: Never true  Transportation Needs: No Transportation Needs (09/13/2022)   PRAPARE - Hydrologist (Medical): No    Lack of Transportation (Non-Medical): No  Physical Activity: Inactive (09/13/2022)   Exercise Vital Sign    Days of Exercise per Week: 0 days    Minutes of Exercise per Session: 0 min  Stress: No Stress Concern Present (09/13/2022)   [place]    Feeling of Stress : Not at all  Social Connections: Moderately Integrated (09/13/2022)   Social Connection and Isolation Panel [NHANES]    Frequency of Communication with Friends and Family: More than three times a week    Frequency of Social Gatherings with Friends and Family: More than three times a week    Attends Religious Services: More than 4 times per year    Active Member of [employer] or Organizations: Yes    Attends Archivist Meetings: Never    Marital Status: Widowed    Tobacco Counseling Counseling given: Not Answered   Clinical Intake:  Pre-visit preparation completed: No  Pain : No/denies pain     Nutritional Status: BMI of 19-24  Normal Nutritional Risks: None Diabetes: No  How often do you need to have someone help you when you read instructions, pamphlets, or other written  materials from your doctor or pharmacy?: 1 - Never  Diabetic?No  Interpreter Needed?: No  Information entered by :: Donnie Mesa, CMA   Activities of Daily Living    09/13/2022    3:16 PM 11/02/2021    8:20 PM  In your present state of health, do you have any difficulty performing the following activities:  Hearing? 0 0  Vision? 0 0  Difficulty concentrating or making decisions? 0 0  Walking or climbing stairs? 0 0  Dressing or bathing? 0 0  Doing errands, shopping? 0 0    Patient Care Team: Leeanne Rio, MD as PCP - General (Pediatrics) Berniece Salines, DO as PCP - Cardiology (Cardiology) Juanito Doom, MD as Consulting Physician (Pulmonary Disease) Constant, Vickii Chafe, MD as Consulting Physician (Obstetrics and Gynecology)  Indicate any recent Medical Services you may have received from other than [facility] providers in the past year (date may be approximate).     Assessment:   This is a routine wellness examination for Nykia.  Hearing/Vision screen Denies any hearing loss.  Denies any vision issues. Wear glasses, Annual Eye Exam.   Dietary issues and exercise activities discussed: Current Exercise Habits: The patient does not participate in regular exercise at present, Exercise limited by: None identified   Goals Addressed             This Visit's Progress    Stay Active and Independent       Why is this important?   Regular activity or exercise is important to managing back pain.  Activity helps to keep your muscles strong.  You will sleep better and feel more relaxed.  You will have more energy and feel less stressed.  If you are not active now, start slowly. Little changes make a big difference.  Rest, but not too much.  Stay as active as you can and listen to your body's signals.           Depression Screen    09/13/2022    3:15 PM 07/04/2022   10:30 AM 02/14/2022    1:46 PM 12/18/2021   12:17 PM 11/06/2021   11:00 AM 06/04/2021    2:23 PM  04/05/2021    4:23 PM  PHQ 2/9 Scores  PHQ - 2 Score 0 0 0 0 0 0 0  PHQ- 9 Score  3 1 3 2 6 3    $ Fall Risk    09/13/2022    3:16 PM 07/04/2022   10:28 AM 12/18/2021   12:17 PM 11/06/2021   11:00 AM 06/04/2021    2:23 PM  [place] in the past year? 0 0 0 0 0  Number falls in past yr: 0 0 0 1 0  Injury with Fall? 0 0 0 0   Risk for fall due to : No Fall Risks      Follow up Falls evaluation completed        FALL RISK PREVENTION PERTAINING TO THE HOME:  Any stairs in or around the home? No  If so, are there any without handrails? No  Home free of loose throw rugs in walkways, pet beds, electrical cords, etc? Yes  Adequate lighting in your home to reduce risk of falls? Yes   ASSISTIVE DEVICES UTILIZED TO PREVENT FALLS:  Life alert? No  Use of a cane, walker or w/c? No  Grab bars in the bathroom? Yes  Shower chair or bench in shower? Yes  Elevated toilet seat or a handicapped toilet? Yes   TIMED UP AND GO:  Was the test performed? Unable to perform, virtual appointment   Cognitive Function:        09/13/2022    3:17 PM 10/05/2020    9:35 AM  6CIT Screen  What Year? 0 points 0 points  What month? 0 points 0 points  What time? 0 points 0 points  Count back from 20 0 points 0 points  Months in reverse 0 points 0 points  Repeat phrase 10 points 0 points  Total Score 10 points 0 points    Immunizations Immunization History  Administered Date(s) Administered   COVID-19, mRNA, vaccine(Comirnaty)12 years and older 06/05/2022   Fluad Quad(high Dose 65+) 06/14/2021   Influenza Split 08/15/2011   Influenza Whole 05/06/2008, 08/24/2009, 05/05/2012   Influenza, High Dose Seasonal PF 04/08/2018   Influenza,inj,Quad PF,6+ Mos 04/22/2013, 08/15/2014, 05/19/2015, 05/05/2016, 05/09/2017   Influenza-Unspecified 04/04/2020, 06/05/2022   PFIZER(Purple Top)SARS-COV-2 Vaccination 02/03/2020, 02/24/2020, 06/26/2020   Pneumococcal Conjugate-13 08/15/2014   Pneumococcal  Polysaccharide-23 07/05/2009, 05/09/2017   Td 08/06/1999   Tdap 12/01/2019   Zoster Recombinat (Shingrix) 02/14/2022    TDAP status: Up to date  Flu Vaccine status: Up to date  Pneumococcal vaccine status: Up to date  Covid-19 vaccine status: Information provided on how to obtain vaccines.   Qualifies for Shingles Vaccine? Yes   Zostavax completed Yes   Shingrix Completed?: No.    Education has been provided regarding the importance of this vaccine. Patient has been advised to call insurance company to determine out of pocket expense if they have not yet received this vaccine. Advised may also receive vaccine at local pharmacy or Health Dept. Verbalized acceptance and understanding.  Screening Tests Health Maintenance  Topic Date Due   Zoster Vaccines- Shingrix (2 of 2) 04/11/2022   COVID-19 Vaccine (5 -  2023-24 season) 07/31/2022   Medicare Annual Wellness (AWV)  09/14/2023   DTaP/Tdap/Td (3 - Td or Tdap) 11/30/2029   Pneumonia Vaccine 40+ Years old  Completed   INFLUENZA VACCINE  Completed   DEXA SCAN  Completed   Hepatitis C Screening  Completed   HPV VACCINES  Aged Out    Health Maintenance  Health Maintenance Due  Topic Date Due   Zoster Vaccines- Shingrix (2 of 2) 04/11/2022   COVID-19 Vaccine (5 - 2023-24 season) 07/31/2022   Colon cancer screening: Does not want to be screened   Mammogram status: Completed 09/17/2021. Repeat every year  DEXA Scan: 09/16/2019  Lung Cancer Screening: (Low Dose CT Chest recommended if Age 6-80 years, 30 pack-year currently smoking OR have quit w/in 15years.) does not qualify.   Lung Cancer Screening Referral: not applicable   Additional Screening:  Hepatitis C Screening: does qualify; Completed 05/16/2015  Vision Screening: Recommended annual ophthalmology exams for early detection of glaucoma and other disorders of the eye. Is the patient up to date with their annual eye exam?  Yes  Who is the provider or what is the  name of the office in which the patient attends annual eye exams?  If pt is not established with a provider, would they like to be referred to a provider to establish care? No .   Dental Screening: Recommended annual dental exams for proper oral hygiene  Community Resource Referral / Chronic Care Management: CRR required this visit?  No   CCM required this visit?  No      Plan:     I have personally reviewed and noted the following in the patient's chart:   Medical and social history Use of alcohol, tobacco or illicit drugs  Current medications and supplements including opioid prescriptions. Patient is not currently taking opioid prescriptions. Functional ability and status Nutritional status Physical activity Advanced directives List of other physicians Hospitalizations, surgeries, and ER visits in previous 12 months Vitals Screenings to include cognitive, depression, and falls Referrals and appointments  In addition, I have reviewed and discussed with patient certain preventive protocols, quality metrics, and best practice recommendations. A written personalized care plan for preventive services as well as general preventive health recommendations were provided to patient.   Ms. Cihlar , Thank you for taking time to come for your Medicare Wellness Visit. I appreciate your ongoing commitment to your health goals. Please review the following plan we discussed and let me know if I can assist you in the future.   These are the goals we discussed:  Goals      Exercise 3x per week (30 min per time)     Start walking 15-30 mins 3x per week with the nicer weather.     LDL CALC < 130     Stay Active and Independent     Why is this important?   Regular activity or exercise is important to managing back pain.  Activity helps to keep your muscles strong.  You will sleep better and feel more relaxed.  You will have more energy and feel less stressed.  If you are not active now,  start slowly. Little changes make a big difference.  Rest, but not too much.  Stay as active as you can and listen to your body's signals.             This is a list of the screening recommended for you and due dates:  Health Maintenance  Topic Date Due  Zoster (Shingles) Vaccine (2 of 2) 04/11/2022   COVID-19 Vaccine (5 - 2023-24 season) 07/31/2022   Medicare Annual Wellness Visit  09/14/2023   DTaP/Tdap/Td vaccine (3 - Td or Tdap) 11/30/2029   Pneumonia Vaccine  Completed   Flu Shot  Completed   DEXA scan (bone density measurement)  Completed   Hepatitis C Screening: USPSTF Recommendation to screen - Ages 64-79 yo.  Completed   HPV Vaccine  Aged [address] [place]   09/13/2022   Nurse Notes: Approximately 30 minute Non-Face -To-Face Medicare Wellness Visit

## 2022-09-13 NOTE — Patient Instructions (Signed)

## 2022-09-14 ENCOUNTER — Other Ambulatory Visit: Payer: Self-pay

## 2022-09-14 ENCOUNTER — Emergency Department (HOSPITAL_COMMUNITY): Payer: Medicare PPO

## 2022-09-14 ENCOUNTER — Emergency Department (HOSPITAL_COMMUNITY)
Admission: EM | Admit: 2022-09-14 | Discharge: 2022-09-14 | Disposition: A | Discharge status: Home / Self Care | Payer: Medicare PPO | Attending: Emergency Medicine | Admitting: Emergency Medicine

## 2022-09-14 ENCOUNTER — Encounter (HOSPITAL_COMMUNITY): Payer: Self-pay | Admitting: *Deleted

## 2022-09-14 DIAGNOSIS — R0602 Shortness of breath: Secondary | ICD-10-CM | POA: Diagnosis not present

## 2022-09-14 DIAGNOSIS — Z7901 Long term (current) use of anticoagulants: Secondary | ICD-10-CM | POA: Diagnosis not present

## 2022-09-14 DIAGNOSIS — R519 Headache, unspecified: Secondary | ICD-10-CM | POA: Diagnosis not present

## 2022-09-14 DIAGNOSIS — R42 Dizziness and giddiness: Secondary | ICD-10-CM | POA: Insufficient documentation

## 2022-09-14 DIAGNOSIS — R112 Nausea with vomiting, unspecified: Secondary | ICD-10-CM | POA: Insufficient documentation

## 2022-09-14 DIAGNOSIS — R110 Nausea: Secondary | ICD-10-CM | POA: Diagnosis not present

## 2022-09-14 DIAGNOSIS — G4489 Other headache syndrome: Secondary | ICD-10-CM | POA: Diagnosis not present

## 2022-09-14 LAB — COMPREHENSIVE METABOLIC PANEL
ALT: 12 U/L (ref 0–44)
AST: 27 U/L (ref 15–41)
Albumin: 3.8 g/dL (ref 3.5–5.0)
Alkaline Phosphatase: 61 U/L (ref 38–126)
Anion gap: 15 (ref 5–15)
BUN: 10 mg/dL (ref 8–23)
CO2: 17 mmol/L — ABNORMAL LOW (ref 22–32)
Calcium: 9.6 mg/dL (ref 8.9–10.3)
Chloride: 106 mmol/L (ref 98–111)
Creatinine, Ser: 1.04 mg/dL — ABNORMAL HIGH (ref 0.44–1.00)
GFR, Estimated: 56 mL/min — ABNORMAL LOW (ref >60)
Glucose, Bld: 141 mg/dL — ABNORMAL HIGH (ref 70–99)
Potassium: 3.1 mmol/L — ABNORMAL LOW (ref 3.5–5.1)
Sodium: 138 mmol/L (ref 135–145)
Total Bilirubin: 0.5 mg/dL (ref 0.3–1.2)
Total Protein: 7.1 g/dL (ref 6.5–8.1)

## 2022-09-14 LAB — CBC WITH DIFFERENTIAL/PLATELET
Abs Immature Granulocytes: 0.02 10*3/uL (ref 0.00–0.07)
Basophils Absolute: 0.1 10*3/uL (ref 0.0–0.1)
Basophils Relative: 1 %
Eosinophils Absolute: 0.1 10*3/uL (ref 0.0–0.5)
Eosinophils Relative: 1 %
HCT: 38.4 % (ref 36.0–46.0)
Hemoglobin: 12.1 g/dL (ref 12.0–15.0)
Immature Granulocytes: 0 %
Lymphocytes Relative: 37 %
Lymphs Abs: 3.3 10*3/uL (ref 0.7–4.0)
MCH: 28.7 pg (ref 26.0–34.0)
MCHC: 31.5 g/dL (ref 30.0–36.0)
MCV: 91 fL (ref 80.0–100.0)
Monocytes Absolute: 0.5 10*3/uL (ref 0.1–1.0)
Monocytes Relative: 5 %
Neutro Abs: 5.2 10*3/uL (ref 1.7–7.7)
Neutrophils Relative %: 56 %
Platelets: 364 10*3/uL (ref 150–400)
RBC: 4.22 MIL/uL (ref 3.87–5.11)
RDW: 14.5 % (ref 11.5–15.5)
WBC: 9.1 10*3/uL (ref 4.0–10.5)
nRBC: 0 % (ref 0.0–0.2)

## 2022-09-14 LAB — TROPONIN I (HIGH SENSITIVITY): Troponin I (High Sensitivity): 4 ng/L (ref <18)

## 2022-09-14 MED ORDER — ONDANSETRON 4 MG PO TBDP
4.0000 mg | ORAL_TABLET | Freq: Once | ORAL | Status: AC
  Administered 2022-09-14: 4 mg via ORAL
  Filled 2022-09-14: qty 1

## 2022-09-14 MED ORDER — DIPHENHYDRAMINE HCL 50 MG/ML IJ SOLN
12.5000 mg | Freq: Once | INTRAMUSCULAR | Status: AC
  Administered 2022-09-14: 12.5 mg via INTRAVENOUS
  Filled 2022-09-14: qty 1

## 2022-09-14 MED ORDER — PROCHLORPERAZINE EDISYLATE 10 MG/2ML IJ SOLN
10.0000 mg | Freq: Once | INTRAMUSCULAR | Status: AC
  Administered 2022-09-14: 10 mg via INTRAVENOUS
  Filled 2022-09-14: qty 2

## 2022-09-14 MED ORDER — MECLIZINE HCL 25 MG PO TABS
25.0000 mg | ORAL_TABLET | Freq: Once | ORAL | Status: AC
  Administered 2022-09-14: 25 mg via ORAL
  Filled 2022-09-14: qty 1

## 2022-09-14 MED ORDER — MECLIZINE HCL 25 MG PO TABS: 25.0000 mg | ORAL_TABLET | Freq: Three times a day (TID) | ORAL | 0 refills | Status: DC | PRN

## 2022-09-14 NOTE — ED Triage Notes (Signed)
Patient presents to ed via [facility] from home c/o sudden onset headache throbbing in nature and pounding c/o dizziness with some sob off and on . Denies fall hx. Blood cloth on coumadin , patient refused zofran per ems

## 2022-09-14 NOTE — ED Notes (Signed)
Discharge instructions reviewed with patient and family at bedside. Patient and family deny any questions or concerns, patient out to lobby via wheelchair with this RN

## 2022-09-14 NOTE — ED Provider Notes (Signed)
[facility] Provider Note   CSN: [id number] Arrival date & time: 09/14/22  1453     History  Chief Complaint  Patient presents with   Dizziness   Headache    [name] is a 77 y.o. female.  Patient here with dizziness, nausea, vomiting, headache.  History of vertigo in the past and feels similar.  She denies any chest pain she feels little short of breath.  She is on Coumadin for blood clots.  Has had migraines in the past.  Denies any weakness or numbness or vision changes or speech changes.  Nothing makes it worse or better.  Symptoms came on fairly suddenly right prior to arrival.  Denies any diarrhea or recent illness.  The history is provided by the patient.       Home Medications Prior to Admission medications   Medication Sig Start Date End Date Taking? Authorizing Provider  meclizine (ANTIVERT) 25 MG tablet Take 1 tablet (25 mg total) by mouth 3 (three) times daily as needed for dizziness. 09/14/22  Yes Ronnald Shedden, DO  acetaminophen (TYLENOL) 650 MG CR tablet Take 1,300 mg by mouth every 8 (eight) hours as needed for pain.    [provider]  amLODipine (NORVASC) 5 MG tablet TAKE 1 TABLET BY MOUTH EVERYDAY AT BEDTIME 02/22/22   Leeanne Rio, MD  ferrous sulfate 325 (65 FE) MG tablet TAKE 1 TABLET BY MOUTH EVERY OTHER DAY 08/02/22   Lind Covert, MD  XARELTO 20 MG TABS tablet TAKE 1 TABLET BY MOUTH EVERY DAY 02/19/22   Leeanne Rio, MD  Zoster Vaccine Adjuvanted [facility]) injection Administer Shingrix vaccination now and repeat in two months Patient not taking: Reported on 09/13/2022 12/18/21   Leeanne Rio, MD      Allergies    Patient has no known allergies.    Review of Systems   Review of Systems  Physical Exam Updated Vital Signs BP (!) 146/65   Pulse 62   Temp 97.7 F (36.5 C) (Oral)   Resp 15   Ht 5' 4"$  (1.626 m)   Wt 81.2 kg   SpO2 100%   BMI 30.73 kg/m   Physical Exam Vitals and nursing note reviewed.  Constitutional:      General: She is not in acute distress.    Appearance: She is well-developed. She is not ill-appearing.  HENT:     Head: Normocephalic and atraumatic.     Mouth/Throat:     Mouth: Mucous membranes are moist.     Pharynx: Oropharynx is clear.  Eyes:     Extraocular Movements: Extraocular movements intact.     Right eye: No nystagmus.     Left eye: No nystagmus.     Conjunctiva/sclera: Conjunctivae normal.     Pupils: Pupils are equal, round, and reactive to light.  Cardiovascular:     Rate and Rhythm: Normal rate and regular rhythm.     Heart sounds: No murmur heard. Pulmonary:     Effort: Pulmonary effort is normal. No respiratory distress.     Breath sounds: Normal breath sounds.  Abdominal:     Palpations: Abdomen is soft.     Tenderness: There is no abdominal tenderness.  Musculoskeletal:        General: No swelling.     Cervical back: Normal range of motion and neck supple.  Skin:    General: Skin is warm and dry.     Capillary Refill: Capillary  refill takes less than 2 seconds.  Neurological:     Mental Status: She is alert and oriented to person, place, and time.     Comments: 5+ out of 5 strength throughout, normal sensation, no drift, normal finger-nose-finger, normal speech  Psychiatric:        Mood and Affect: Mood normal.     ED Results / Procedures / Treatments   Labs (all labs ordered are listed, but only abnormal results are displayed) Labs Reviewed  COMPREHENSIVE METABOLIC PANEL - Abnormal; Notable for the following components:      Result Value   Potassium 3.1 (*)    CO2 17 (*)    Glucose, Bld 141 (*)    Creatinine, Ser 1.04 (*)    GFR, Estimated 56 (*)    All other components within normal limits  CBC WITH DIFFERENTIAL/PLATELET  TROPONIN I (HIGH SENSITIVITY)    EKG EKG Interpretation  Date/Time:  Saturday September 14 2022 15:01:03 EST Ventricular Rate:  81 PR  Interval:  152 QRS Duration: 98 QT Interval:  400 QTC Calculation: 464 R Axis:   -41 Text Interpretation: Normal sinus rhythm Confirmed by Lennice Sites ([id number]) on 09/14/2022 3:41:43 PM  Radiology DG Chest 2 View  Result Date: 09/14/2022 CLINICAL DATA:  Dizziness.  Shortness of breath. EXAM: CHEST - 2 VIEW COMPARISON:  01/02/2022. FINDINGS: Cardiac silhouette is normal in size. No mediastinal or hilar masses. No evidence of adenopathy. Even treated left hemidiaphragm. Lungs are clear.  No pleural effusion or pneumothorax. Skeletal structures are intact. IMPRESSION: No active cardiopulmonary disease. Electronically Signed   By: Lajean Manes M.D.   On: 09/14/2022 16:41   CT Head Wo Contrast  Result Date: 09/14/2022 CLINICAL DATA:  Sudden on set of headache. Dizziness. Some shortness of breath. EXAM: CT HEAD WITHOUT CONTRAST TECHNIQUE: Contiguous axial images were obtained from the base of the skull through the vertex without intravenous contrast. RADIATION DOSE REDUCTION: This exam was performed according to the departmental dose-optimization program which includes automated exposure control, adjustment of the mA and/or kV according to patient size and/or use of iterative reconstruction technique. COMPARISON:  11/02/2021. FINDINGS: Brain: No evidence of acute infarction, hemorrhage, hydrocephalus, extra-axial collection or mass lesion/mass effect. Vascular: No hyperdense vessel or unexpected calcification. Skull: Normal. Negative for fracture or focal lesion. Sinuses/Orbits: Globes and orbits are unremarkable. Chronic opacification of the left sphenoid sinus associated with wall thickening. There is also opacification of a posterior left ethmoid air cell. Remaining visualized sinuses are essentially clear. Other: None. IMPRESSION: 1. No acute intracranial abnormalities. 2. Chronic opacification of the left sphenoid sinus. Electronically Signed   By: Lajean Manes M.D.   On: 09/14/2022 16:35     Procedures Procedures    Medications Ordered in ED Medications  meclizine (ANTIVERT) tablet 25 mg (25 mg Oral Given 09/14/22 1606)  ondansetron (ZOFRAN-ODT) disintegrating tablet 4 mg (4 mg Oral Given 09/14/22 1606)  prochlorperazine (COMPAZINE) injection 10 mg (10 mg Intravenous Given 09/14/22 1728)  diphenhydrAMINE (BENADRYL) injection 12.5 mg (12.5 mg Intravenous Given 09/14/22 1726)    ED Course/ Medical Decision Making/ A&P                             Medical Decision Making Amount and/or Complexity of Data Reviewed Labs: ordered. Radiology: ordered.  Risk Prescription drug management.   MAYLIAH MERONEY is here with dizziness.  Normal vitals.  No fever.  History of migraines and vertigo and blood  clots on anticoagulation.  Normal vitals, no fever.  I have seen her after she has been given meclizine and Zofran and she is feeling a lot better.  She had onset of dizziness and nausea after she stood up quickly.  She has normal neurological exam.  Differential is likely vertigo and at this time I have very low suspicion for stroke as she is neurologically intact.  She is already had some improvement.  No obvious nystagmus on exam.  Denies any chest pain or shortness of breath actively.  She overall appears well.  Will get basic labs including head CT, EKG, troponin.  Per my review and interpretation of labs, there is no significant anemia or electrolyte abnormality or kidney injury.  Head CT is unremarkable.  Chest x-ray without any evidence of pneumonia.  Troponin is normal.  I have no concern for cardiac or pulmonary process.  On reevaluation she is feeling much improved.  My suspicion is that this was an episode of vertigo.  Will prescribe meclizine. Will discharge to home.  This chart was dictated using voice recognition software.  Despite best efforts to proofread,  errors can occur which can change the documentation meaning.         Final Clinical Impression(s) / ED  Diagnoses Final diagnoses:  Dizziness    Rx / DC Orders ED Discharge Orders          Ordered    meclizine (ANTIVERT) 25 MG tablet  3 times daily PRN        09/14/22 [place], [place], DO 09/14/22 1854

## 2022-09-14 NOTE — ED Provider Triage Note (Signed)
Emergency Medicine Provider Triage Evaluation Note  [name] , a 77 y.o. female  was evaluated in triage.  Pt complains of dizziness since 11:00 today.  She states that it feels like the world is spinning, and that she has some nausea and feels like she may vomit.  States she just feels unwell in general.  She denies any weakness on one side of the body, difficulty speech or swallowing.  Has had vertigo before, and she states that she does not know if this is it.  Also feels very short of breath, has been compliant with her Coumadin.  Denies any chest pain..  Review of Systems  Positive: Dizziness, weakness Negative: Chest pain  Physical Exam  BP (!) 133/58 (BP Location: Right Arm)   Pulse 78   Temp 97.7 F (36.5 C) (Oral)   Resp (!) 24   Ht 5' 4"$  (1.626 m)   Wt 81.2 kg   SpO2 100%   BMI 30.73 kg/m  Gen:   Awake,  Resp:  Breathing heavily, lungs clear to auscultation bilaterally MSK:   Moves extremities without difficulty  Other:  No neurodeficits on exam. Equal strength of BUE and BLE  Medical Decision Making  Medically screening exam initiated at 3:38 PM.  Appropriate orders placed.  ROZANA GORA was informed that the remainder of the evaluation will be completed by another provider, this initial triage assessment does not replace that evaluation, and the importance of remaining in the ED until their evaluation is complete.    Osvaldo Shipper, [place] 09/14/22 1540

## 2022-09-16 ENCOUNTER — Telehealth: Payer: Self-pay | Admitting: Family Medicine

## 2022-09-16 NOTE — Telephone Encounter (Signed)
Patients son Willa Rough is calling and would like for Dr. Ardelia Mems to call her to discuss something about his mother. There is an appointment scheduled but he said he needs to speak with Dr Ardelia Mems before.   The best call back for [facility] is [phone]

## 2022-09-16 NOTE — Telephone Encounter (Signed)
Spoke with patient's son.  He just wanted to update me on her ER visit over the weekend for headache and dizziness.  She is doing much better now.  Has appointment scheduled with me on February 29.  I will see them then.  Son Patent attorney.  Leeanne Rio, MD

## 2022-10-03 ENCOUNTER — Ambulatory Visit: Payer: Medicare PPO | Admitting: Family Medicine

## 2022-10-04 ENCOUNTER — Ambulatory Visit (INDEPENDENT_AMBULATORY_CARE_PROVIDER_SITE_OTHER): Payer: Medicare PPO | Admitting: Student

## 2022-10-04 ENCOUNTER — Telehealth: Payer: Self-pay | Admitting: Student

## 2022-10-04 VITALS — BP 147/68 | HR 70 | Ht 64.0 in | Wt 172.8 lb

## 2022-10-04 DIAGNOSIS — R519 Headache, unspecified: Secondary | ICD-10-CM | POA: Diagnosis not present

## 2022-10-04 LAB — C-REACTIVE PROTEIN: CRP: 1 mg/L (ref 0–10)

## 2022-10-04 LAB — SEDIMENTATION RATE: Sed Rate: 35 mm/hr (ref 0–40)

## 2022-10-04 NOTE — Assessment & Plan Note (Signed)
Headache involving bilateral eyes but worse on the left eye and involving temporal region with tenderness to palpation along left temple.  Concern for temporal arteritis given tenderness to palpation of the temple area so I will obtain STAT inflammatory markers.  New headache in patient older than 51-concerning for intracranial abnormalities given new onset and worsening progression.  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  Reassuringly CT head without any acute intracranial abnormalities and neuro examination overall benign.  Will obtain MRI brain for further evaluation. -STAT CRP and ESR -MRI brain without contrast -ED/return precautions discussed

## 2022-10-04 NOTE — Progress Notes (Signed)
    SUBJECTIVE:   CHIEF COMPLAINT / HPI: ED/headache follow-up  ED follow up for headache and dizziness.  At that visit they had discussed her history of migraines and vertigo and blood clots on anticoagulation.  They had obtained a CT head at that time which was negative for any acute changes. Trops negative. EKG NSR.  These headaches are new for her although she cannot give me a specific time that the headaches initially started she says that its gotten much worse this happened in her old age.  She no longer has any dizziness but continues to have headaches mostly on her temple region. Meclizine-took away dizziness but not the headaches. She also says that she has pain behind her eyes bilaterally.  She does have pain worse on the left eye.  Intermittently has some blurriness but denies any double vision or other vision changes.  She says that the left eye pain has been ongoing for many days on and off.  She has Tylenol as needed.  Denies any sided or focal weakness.  Denies any vomiting or fevers.  Of note she is on Xarelto for recurrent DVT and has had negative hypercoagulable workup in 2005.   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, HTN  OBJECTIVE:   BP (!) 147/68   Pulse 70   Ht '5\' 4"'$  (1.626 m)   Wt 172 lb 12.8 oz (78.4 kg)   SpO2 100%   BMI 29.66 kg/m   General: Well appearing, NAD, awake, alert, responsive to questions Head: Normocephalic atraumatic, tenderness to palpation on left temple region, mild pain with extraocular movement to the left pupils equal and reactive CV: Regular rate and rhythm no murmurs rubs or gallops Respiratory: Clear to ausculation bilaterally, no wheezes rales or crackles, chest rises symmetrically,  no increased work of breathing Neuro: CN II: PERRL CN III, IV,VI: EOMI(some pain with lateral movement to left), no nsytagmus CV V: Normal sensation  CVII: Symmetric smile and brow raise CN VIII: Normal hearing CN  IX,X: Symmetric palate raise  CN XI: 5/5 shoulder shrug CN XII: Symmetric tongue protrusion  UE and LE strength 5/5 Normal sensation in UE and LE bilaterally  ASSESSMENT/PLAN:   Headache above the eye region Headache involving bilateral eyes but worse on the left eye and involving temporal region with tenderness to palpation along left temple.  Concern for temporal arteritis given tenderness to palpation of the temple area so I will obtain STAT inflammatory markers.  New headache in patient older than 51-concerning for intracranial abnormalities given new onset and worsening progression.  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  Reassuringly CT head without any acute intracranial abnormalities and neuro examination overall benign.  Will obtain MRI brain for further evaluation. -STAT CRP and ESR -MRI brain without contrast -ED/return precautions discussed   Gerrit Heck, MD [place]

## 2022-10-04 NOTE — Telephone Encounter (Signed)
Calledx4 and left VM that ESR and CRP within normal limits. If any questions may call clinic back. Very low likelihood of temporal arteritis.

## 2022-10-04 NOTE — Patient Instructions (Signed)
It was great to see you! Thank you for allowing me to participate in your care!   Our plans for today:  - I am going to get an MRI Brain - I am going to get inflammatory markers to make sure this is not an inflammatory condition- iw ill let you know what these results are  Take care and seek immediate care sooner if you develop any concerns.  Gerrit Heck, MD

## 2022-10-17 ENCOUNTER — Ambulatory Visit (HOSPITAL_COMMUNITY)
Admission: RE | Admit: 2022-10-17 | Discharge: 2022-10-17 | Disposition: A | Discharge status: Home / Self Care | Payer: Medicare PPO | Source: Ambulatory Visit | Attending: Family Medicine | Admitting: Family Medicine

## 2022-10-17 DIAGNOSIS — R519 Headache, unspecified: Secondary | ICD-10-CM

## 2022-11-01 ENCOUNTER — Ambulatory Visit: Payer: Medicare PPO | Admitting: Cardiology

## 2022-11-07 ENCOUNTER — Ambulatory Visit
Admission: RE | Admit: 2022-11-07 | Discharge: 2022-11-07 | Disposition: A | Discharge status: Home / Self Care | Payer: Medicare PPO | Source: Ambulatory Visit | Attending: Family Medicine | Admitting: Family Medicine

## 2022-11-07 DIAGNOSIS — R928 Other abnormal and inconclusive findings on diagnostic imaging of breast: Secondary | ICD-10-CM

## 2022-11-07 DIAGNOSIS — R922 Inconclusive mammogram: Secondary | ICD-10-CM | POA: Diagnosis not present

## 2022-11-19 ENCOUNTER — Other Ambulatory Visit: Payer: Self-pay

## 2022-11-19 ENCOUNTER — Emergency Department (HOSPITAL_BASED_OUTPATIENT_CLINIC_OR_DEPARTMENT_OTHER): Payer: Medicare PPO | Admitting: Radiology

## 2022-11-19 ENCOUNTER — Emergency Department (HOSPITAL_BASED_OUTPATIENT_CLINIC_OR_DEPARTMENT_OTHER)
Admission: EM | Admit: 2022-11-19 | Discharge: 2022-11-19 | Disposition: A | Discharge status: Home / Self Care | Payer: Medicare PPO | Attending: Emergency Medicine | Admitting: Emergency Medicine

## 2022-11-19 ENCOUNTER — Encounter (HOSPITAL_BASED_OUTPATIENT_CLINIC_OR_DEPARTMENT_OTHER): Payer: Self-pay | Admitting: Emergency Medicine

## 2022-11-19 ENCOUNTER — Ambulatory Visit: Payer: Medicare PPO | Admitting: Cardiology

## 2022-11-19 ENCOUNTER — Other Ambulatory Visit (HOSPITAL_BASED_OUTPATIENT_CLINIC_OR_DEPARTMENT_OTHER): Payer: Self-pay

## 2022-11-19 DIAGNOSIS — M79674 Pain in right toe(s): Secondary | ICD-10-CM | POA: Insufficient documentation

## 2022-11-19 MED ORDER — IBUPROFEN 400 MG PO TABS
400.0000 mg | ORAL_TABLET | Freq: Once | ORAL | Status: AC
  Administered 2022-11-19: 400 mg via ORAL
  Filled 2022-11-19: qty 1

## 2022-11-19 MED ORDER — PREDNISONE 10 MG PO TABS
20.0000 mg | ORAL_TABLET | Freq: Every day | ORAL | 0 refills | Status: AC
  Filled 2022-11-19: qty 20, 10d supply, fill #0

## 2022-11-19 NOTE — ED Provider Notes (Signed)
[facility] EMERGENCY DEPARTMENT AT [facility] Provider Note   CSN: [id number] Arrival date & time: 11/19/22  1150     History  Chief Complaint  Patient presents with   Toe Pain    [name] is a 77 y.o. female.  HPI 77 yo female co right great toe pain.  No known injury.  Patient thinks she had gout previously.  No fever, chills spreading redness.     Home Medications Prior to Admission medications   Medication Sig Start Date End Date Taking? Authorizing Provider  predniSONE (DELTASONE) 10 MG tablet Take 2 tablets (20 mg total) by mouth daily for 10 days. 11/19/22 11/29/22 Yes Margarita Grizzle, MD  acetaminophen (TYLENOL) 650 MG CR tablet Take 1,300 mg by mouth every 8 (eight) hours as needed for pain.    [provider]  amLODipine (NORVASC) 5 MG tablet TAKE 1 TABLET BY MOUTH EVERYDAY AT BEDTIME 02/22/22   Latrelle Dodrill, MD  ferrous sulfate 325 (65 FE) MG tablet TAKE 1 TABLET BY MOUTH EVERY OTHER DAY 08/02/22   Carney Living, MD  meclizine (ANTIVERT) 25 MG tablet Take 1 tablet (25 mg total) by mouth 3 (three) times daily as needed for dizziness. 09/14/22   [place], Adam, DO  XARELTO 20 MG TABS tablet TAKE 1 TABLET BY MOUTH EVERY DAY 02/19/22   Latrelle Dodrill, MD  Zoster Vaccine Adjuvanted [facility]) injection Administer Shingrix vaccination now and repeat in two months Patient not taking: Reported on 09/13/2022 12/18/21   Latrelle Dodrill, MD      Allergies    Patient has no known allergies.    Review of Systems   Review of Systems  Physical Exam Updated Vital Signs BP (!) 140/65 (BP Location: Left Arm)   Pulse 75   Temp 98.2 F (36.8 C) (Oral)   Resp 18   SpO2 100%  Physical Exam Vitals and nursing note reviewed.  HENT:     Head: Normocephalic.     Nose: Nose normal.  Eyes:     Pupils: Pupils are equal, round, and reactive to light.  Cardiovascular:     Rate and Rhythm: Normal rate.     Pulses: Normal pulses.   Pulmonary:     Effort: Pulmonary effort is normal.  Abdominal:     Palpations: Abdomen is soft.  Musculoskeletal:     Cervical back: Normal range of motion.     Comments: Right great toe mtp joint with some warmth and ttp No surrounding erythema or fluctuance No ttp of proximal leg  Skin:    General: Skin is warm and dry.     Capillary Refill: Capillary refill takes less than 2 seconds.  Neurological:     Mental Status: She is alert. Mental status is at baseline.  Psychiatric:        Mood and Affect: Mood normal.     ED Results / Procedures / Treatments   Labs (all labs ordered are listed, but only abnormal results are displayed) Labs Reviewed - No data to display  EKG None  Radiology DG Toe Great Right  Result Date: 11/19/2022 CLINICAL DATA:  Pain. EXAM: RIGHT GREAT TOE three views COMPARISON:  None Available. FINDINGS: Osteopenia. No fracture or dislocation. Preserved joint spaces. Vascular calcifications are seen. No soft tissue densities identified. No erosive change. IMPRESSION: No acute osseous abnormality. No erosive changes. No soft tissue masses. Electronically Signed   By: Karen Kays M.D.   On: 11/19/2022 14:58  Procedures Procedures    Medications Ordered in ED Medications  ibuprofen (ADVIL) tablet 400 mg (400 mg Oral Given 11/19/22 1459)    ED Course/ Medical Decision Making/ A&P Clinical Course as of 11/19/22 1506  Tue Nov 19, 2022  1506 Right great toe x-Thena Devora reviewed interpreted no acute abnormality noted on my interpretation radiologist interpretation concurs [DR]    Clinical Course User Index [DR] Margarita Grizzle, MD                             Medical Decision Making Amount and/or Complexity of Data Reviewed Radiology: ordered.           Final Clinical Impression(s) / ED Diagnoses Final diagnoses:  Pain of toe of right foot    Rx / DC Orders ED Discharge Orders          Ordered    predniSONE (DELTASONE) 10 MG tablet  Daily         11/19/22 1452              Margarita Grizzle, MD 11/19/22 1506

## 2022-11-19 NOTE — ED Triage Notes (Signed)
Pt arrived POV with son, caox4, ambulatory in triage c/o redness, swelling, and pain in the R big toe x5 days. Pt denies any recent injury. Denies fever/chills.

## 2022-11-19 NOTE — ED Notes (Signed)
Pt given discharge instructions and reviewed prescriptions. Opportunities given for questions. Pt verbalizes understanding. Adely Facer R, RN 

## 2022-11-25 ENCOUNTER — Telehealth: Payer: Self-pay

## 2022-11-25 NOTE — Telephone Encounter (Signed)
     Patient  visit on 11/19/2022  at [facility] was for right toe  pain.  Have you been able to follow up with your primary care physician? Yes  The patient was or was not able to obtain any needed medicine or equipment. Patient was able to obtain medication.  Are there diet recommendations that you are having difficulty following? No  Patient expresses understanding of discharge instructions and education provided has no other needs at this time. Yes   Laureen Frederic [facility]  [facility] Population Health Community Resource Care Guide   [email or web address]  ?? [phone]   Website: [email or web address]  [email or web address]

## 2022-12-06 ENCOUNTER — Telehealth: Payer: Self-pay

## 2022-12-06 MED ORDER — PREDNISONE 20 MG PO TABS: 20.0000 mg | ORAL_TABLET | Freq: Every day | ORAL | 0 refills | Status: DC

## 2022-12-06 NOTE — Telephone Encounter (Signed)
Patients son calls nurse line requesting a round of prednisone.   He reports she was seen by UC on 4/16 and given a round for gout.   He reports she is having a gout flare again and would like the same prescription. Reports symptoms started ~ 2 days ago.   Will forward to PCP for advisement.

## 2022-12-06 NOTE — Telephone Encounter (Signed)
Returned call to son and advised of message per Dr. Pollie Meyer. He has no further questions at this time.   Veronda Prude, RN

## 2022-12-06 NOTE — Telephone Encounter (Signed)
I sent in a five day course of prednisone 20mg  daily. It looks like they previously treated her with prednisone for 10 days, but I don't want to repeat steroids for that long without seeing her in the office.  Please inform son if this does not work she'll need to be seen in our office for further evaluation  Thanks! Latrelle Dodrill, MD

## 2022-12-07 ENCOUNTER — Emergency Department (HOSPITAL_BASED_OUTPATIENT_CLINIC_OR_DEPARTMENT_OTHER)
Admission: EM | Admit: 2022-12-07 | Discharge: 2022-12-07 | Disposition: A | Discharge status: Home / Self Care | Payer: Medicare PPO | Attending: Emergency Medicine | Admitting: Emergency Medicine

## 2022-12-07 ENCOUNTER — Encounter (HOSPITAL_BASED_OUTPATIENT_CLINIC_OR_DEPARTMENT_OTHER): Payer: Self-pay

## 2022-12-07 ENCOUNTER — Emergency Department (HOSPITAL_BASED_OUTPATIENT_CLINIC_OR_DEPARTMENT_OTHER): Payer: Medicare PPO

## 2022-12-07 DIAGNOSIS — J0120 Acute ethmoidal sinusitis, unspecified: Secondary | ICD-10-CM | POA: Insufficient documentation

## 2022-12-07 DIAGNOSIS — M109 Gout, unspecified: Secondary | ICD-10-CM

## 2022-12-07 DIAGNOSIS — Z7901 Long term (current) use of anticoagulants: Secondary | ICD-10-CM | POA: Diagnosis not present

## 2022-12-07 DIAGNOSIS — I10 Essential (primary) hypertension: Secondary | ICD-10-CM | POA: Diagnosis not present

## 2022-12-07 DIAGNOSIS — Z86711 Personal history of pulmonary embolism: Secondary | ICD-10-CM | POA: Insufficient documentation

## 2022-12-07 DIAGNOSIS — Z20822 Contact with and (suspected) exposure to covid-19: Secondary | ICD-10-CM | POA: Insufficient documentation

## 2022-12-07 DIAGNOSIS — M10071 Idiopathic gout, right ankle and foot: Secondary | ICD-10-CM | POA: Insufficient documentation

## 2022-12-07 DIAGNOSIS — Z79899 Other long term (current) drug therapy: Secondary | ICD-10-CM | POA: Diagnosis not present

## 2022-12-07 DIAGNOSIS — R519 Headache, unspecified: Secondary | ICD-10-CM | POA: Diagnosis not present

## 2022-12-07 LAB — CBC WITH DIFFERENTIAL/PLATELET
Abs Immature Granulocytes: 0.06 10*3/uL (ref 0.00–0.07)
Basophils Absolute: 0 10*3/uL (ref 0.0–0.1)
Basophils Relative: 1 %
Eosinophils Absolute: 0.1 10*3/uL (ref 0.0–0.5)
Eosinophils Relative: 1 %
HCT: 35.5 % — ABNORMAL LOW (ref 36.0–46.0)
Hemoglobin: 11 g/dL — ABNORMAL LOW (ref 12.0–15.0)
Immature Granulocytes: 1 %
Lymphocytes Relative: 22 %
Lymphs Abs: 1.7 10*3/uL (ref 0.7–4.0)
MCH: 27.8 pg (ref 26.0–34.0)
MCHC: 31 g/dL (ref 30.0–36.0)
MCV: 89.6 fL (ref 80.0–100.0)
Monocytes Absolute: 0.5 10*3/uL (ref 0.1–1.0)
Monocytes Relative: 7 %
Neutro Abs: 5 10*3/uL (ref 1.7–7.7)
Neutrophils Relative %: 68 %
Platelets: 282 10*3/uL (ref 150–400)
RBC: 3.96 MIL/uL (ref 3.87–5.11)
RDW: 15.2 % (ref 11.5–15.5)
WBC: 7.4 10*3/uL (ref 4.0–10.5)
nRBC: 0 % (ref 0.0–0.2)

## 2022-12-07 LAB — SARS CORONAVIRUS 2 BY RT PCR: SARS Coronavirus 2 by RT PCR: NEGATIVE

## 2022-12-07 LAB — URINALYSIS, ROUTINE W REFLEX MICROSCOPIC
Bilirubin Urine: NEGATIVE
Glucose, UA: NEGATIVE mg/dL
Hgb urine dipstick: NEGATIVE
Ketones, ur: NEGATIVE mg/dL
Leukocytes,Ua: NEGATIVE
Nitrite: NEGATIVE
Protein, ur: NEGATIVE mg/dL
Specific Gravity, Urine: 1.024 (ref 1.005–1.030)
pH: 5.5 (ref 5.0–8.0)

## 2022-12-07 LAB — BASIC METABOLIC PANEL
Anion gap: 8 (ref 5–15)
BUN: 14 mg/dL (ref 8–23)
CO2: 27 mmol/L (ref 22–32)
Calcium: 9.4 mg/dL (ref 8.9–10.3)
Chloride: 106 mmol/L (ref 98–111)
Creatinine, Ser: 1.04 mg/dL — ABNORMAL HIGH (ref 0.44–1.00)
GFR, Estimated: 56 mL/min — ABNORMAL LOW (ref >60)
Glucose, Bld: 92 mg/dL (ref 70–99)
Potassium: 3.9 mmol/L (ref 3.5–5.1)
Sodium: 141 mmol/L (ref 135–145)

## 2022-12-07 LAB — TROPONIN I (HIGH SENSITIVITY): Troponin I (High Sensitivity): 3 ng/L (ref <18)

## 2022-12-07 MED ORDER — PREDNISONE 20 MG PO TABS
40.0000 mg | ORAL_TABLET | Freq: Once | ORAL | Status: AC
  Administered 2022-12-07: 40 mg via ORAL
  Filled 2022-12-07: qty 2

## 2022-12-07 MED ORDER — COLCHICINE 0.6 MG PO TABS
0.6000 mg | ORAL_TABLET | Freq: Once | ORAL | Status: AC
  Administered 2022-12-07: 0.6 mg via ORAL
  Filled 2022-12-07: qty 1

## 2022-12-07 MED ORDER — PREDNISONE 10 MG (21) PO TBPK: ORAL_TABLET | Freq: Every day | ORAL | 0 refills | Status: DC

## 2022-12-07 MED ORDER — AMOXICILLIN-POT CLAVULANATE 875-125 MG PO TABS: 1.0000 | ORAL_TABLET | Freq: Two times a day (BID) | ORAL | 0 refills | Status: DC

## 2022-12-07 MED ORDER — ACETAMINOPHEN 325 MG PO TABS
650.0000 mg | ORAL_TABLET | Freq: Once | ORAL | Status: AC
  Administered 2022-12-07: 650 mg via ORAL
  Filled 2022-12-07: qty 2

## 2022-12-07 MED ORDER — COLCHICINE 0.6 MG PO TABS
1.2000 mg | ORAL_TABLET | Freq: Once | ORAL | Status: AC
  Administered 2022-12-07: 1.2 mg via ORAL
  Filled 2022-12-07: qty 2

## 2022-12-07 NOTE — Discharge Instructions (Addendum)
You came to the emergency department today with headache and chills.  Your workup is reassuring and you did not have a UTI.  Your head CT did show a sinusitis which may be causing you to have headaches and viral symptoms such as body aches and chills.  I am treating you with Augmentin for this.  Take it with food as it may upset your stomach.  Your physical exam is consistent with gout.  You were treated with steroids and a medication called colchicine in the emergency department however I am starting you on an outpatient course of steroids.  Please follow the directions on the package over the next 10 days.  Do not pick up steroids from your PCP, only take the longer course since I was able to see you in person.  I suggest you follow-up with your PCP about your gout and she can decide if you need to continue steroid treatment or need a preventative medication for your recurrent gout.  They can also reevaluate your sinusitis.    Do not hesitate to return to the emergency department with any worsening symptoms.

## 2022-12-07 NOTE — ED Provider Notes (Signed)
[place] EMERGENCY DEPARTMENT AT [facility] Provider Note   CSN: [id number] Arrival date & time: 12/07/22  4098     History  Chief Complaint  Patient presents with   UTI     [name] is a 77 y.o. female with a past medical history of hypertension, hyperlipidemia, GERD and PE on Xarelto presenting today with concern for UTI.  Her son is at bedside and reports that she called him this morning saying that she had a headache and chills.  He was concerned it was a urinary tract infection because that is what it was the last time she had the symptoms.  Patient denies any dysuria, hematuria, urgency or frequency.  No abdominal pain.  She does report that she has a frontal headache that she usually takes Tylenol for but was not able to take any before coming here today.  Son also believes her gout in her right foot is flaring for the past 2 weeks.  HPI     Home Medications Prior to Admission medications   Medication Sig Start Date End Date Taking? Authorizing Provider  acetaminophen (TYLENOL) 650 MG CR tablet Take 1,300 mg by mouth every 8 (eight) hours as needed for pain.    [provider]  amLODipine (NORVASC) 5 MG tablet TAKE 1 TABLET BY MOUTH EVERYDAY AT BEDTIME 02/22/22   Latrelle Dodrill, MD  ferrous sulfate 325 (65 FE) MG tablet TAKE 1 TABLET BY MOUTH EVERY OTHER DAY 08/02/22   Carney Living, MD  meclizine (ANTIVERT) 25 MG tablet Take 1 tablet (25 mg total) by mouth 3 (three) times daily as needed for dizziness. 09/14/22   Curatolo, Adam, DO  predniSONE (DELTASONE) 20 MG tablet Take 1 tablet (20 mg total) by mouth daily with breakfast for 5 days. 12/06/22 12/11/22  Latrelle Dodrill, MD  XARELTO 20 MG TABS tablet TAKE 1 TABLET BY MOUTH EVERY DAY 02/19/22   Latrelle Dodrill, MD  Zoster Vaccine Adjuvanted [facility]) injection Administer Shingrix vaccination now and repeat in two months Patient not taking: Reported on 09/13/2022 12/18/21   Latrelle Dodrill, MD      Allergies    Patient has no known allergies.    Review of Systems   Review of Systems  Physical Exam Updated Vital Signs BP 118/71   Pulse 72   Temp 98.3 F (36.8 C) (Oral)   Resp 15   SpO2 100%  Physical Exam Vitals and nursing note reviewed.  Constitutional:      Appearance: Normal appearance.  HENT:     Head: Normocephalic and atraumatic.  Eyes:     General: No scleral icterus.    Conjunctiva/sclera: Conjunctivae normal.  Cardiovascular:     Rate and Rhythm: Normal rate and regular rhythm.  Pulmonary:     Effort: Pulmonary effort is normal. No respiratory distress.  Abdominal:     General: Abdomen is flat.     Palpations: Abdomen is soft.     Tenderness: There is no abdominal tenderness.  Musculoskeletal:     Comments: Warmth and erythema to the first MTP  Skin:    Findings: No rash.  Neurological:     Mental Status: She is alert.     Comments: Cranial nerves II through XII grossly intact.  Moving all extremities with normal strength throughout.  No facial droop or aphasia.  Psychiatric:        Mood and Affect: Mood normal.     ED Results / Procedures /  Treatments   Labs (all labs ordered are listed, but only abnormal results are displayed) Labs Reviewed  CBC WITH DIFFERENTIAL/PLATELET - Abnormal; Notable for the following components:      Result Value   Hemoglobin 11.0 (*)    HCT 35.5 (*)    All other components within normal limits  BASIC METABOLIC PANEL - Abnormal; Notable for the following components:   Creatinine, Ser 1.04 (*)    GFR, Estimated 56 (*)    All other components within normal limits  SARS CORONAVIRUS 2 BY RT PCR  URINALYSIS, ROUTINE W REFLEX MICROSCOPIC  TROPONIN I (HIGH SENSITIVITY)    EKG None  Radiology No results found.  Procedures Procedures   Medications Ordered in ED Medications  colchicine tablet 0.6 mg (has no administration in time range)  acetaminophen (TYLENOL) tablet 650 mg (650 mg Oral  Given 12/07/22 1013)  predniSONE (DELTASONE) tablet 40 mg (40 mg Oral Given 12/07/22 1013)  colchicine tablet 1.2 mg (1.2 mg Oral Given 12/07/22 1013)    ED Course/ Medical Decision Making/ A&P                             Medical Decision Making Amount and/or Complexity of Data Reviewed Labs: ordered. Radiology: ordered. ECG/medicine tests: ordered.  Risk OTC drugs. Prescription drug management.   77 year old female presenting today with concern for for UTI per her family.  She originally complained of a headache and chills.  Differential includes but is not limited to viral illness such as COVID, sinusitis, tension headache, migraine headache, intracranial hemorrhage, CVA  This is not an exhaustive differential.    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  Baseline creatinine appears to be 1.04, GFR 56  She was also treated with gout with prednisone 2 weeks ago and continued to have a flare.     Physical Exam: Pertinent physical exam findings include Normal head to toe neurologic exam Swelling and erythema to the first MTP of the right foot  Lab Tests: I ordered, and personally interpreted labs.  The pertinent results include: No leukocytosis Creatinine 1.04, GFR 56.  Around her baseline.   Imaging Studies: Head CT ordered by attending and reveals ethmoid sinusitis   Cardiac Monitoring:  The patient was maintained on a cardiac monitor.  I viewed and interpreted the cardiac monitored which showed an underlying rhythm of: Sinus   Medications: Tylenol for headache Prednisone and colchicine for gout  MDM/Disposition: This is a 77 year old female presenting today with headache, chills and the concern for UTI.  Urinalysis negative for UTI.  Lab work reassuring, low suspicion ACS causing her chills.  Troponin negative x 1 with a  nonischemic EKG.  Patient is afebrile, not tachycardic and does not have a leukocytosis.  No abdominal tenderness either.  Doubt intra-abdominal infection causing the symptoms.  She denied any urinary symptoms to begin with, has no flank pain and again has stable vital signs.  Low suspicion pyelonephritis from a brewing cystitis.  CT of her head revealing of sinusitis.  This may be contributing to her headaches.  This was treated successfully with Tylenol.  PE also consistent with gout.  She is on Xarelto so I did not treat her with NSAIDs.  She was given colchicine being that she seems to have recently failed outpatient prednisone treatment.  She will continue with Tylenol and another course of steroids  After patient's work-up today, I feel that she has been reasonably screened for emergent condition requiring further evaluation and intervention.  I spoke about her case with her family at bedside and they are agreeable with discharge with prednisone and Augmentin and PCP follow-up.    I discussed this case with my attending physician Dr. Manus Gunning who cosigned this note including patient's presenting symptoms, physical exam, and planned diagnostics and interventions. Attending physician stated agreement with plan or made changes to plan which were implemented.     Final Clinical Impression(s) / ED Diagnoses Final diagnoses:  Acute gout involving toe of right foot, unspecified cause  Acute non-recurrent ethmoidal sinusitis    Rx / DC Orders ED Discharge Orders          Ordered    predniSONE (STERAPRED UNI-PAK 21 TAB) 10 MG (21) TBPK tablet  Daily        12/07/22 1008    amoxicillin-clavulanate (AUGMENTIN) 875-125 MG tablet  Every 12 hours        12/07/22 1209           Results and diagnoses were explained to the patient. Return precautions discussed in full. Patient had no additional questions and expressed complete understanding.   This chart was dictated using voice recognition  software.  Despite best efforts to proofread,  errors can occur which can change the documentation meaning.    Woodroe Chen 12/07/22 1215    Glynn Octave, MD 12/07/22 1515

## 2022-12-07 NOTE — ED Triage Notes (Signed)
She has had a mild h/a along with mild dysuria x 2 days. She recognizes this as what has been dx with u.t.I. in the past. She is alert and in no distress.

## 2022-12-25 NOTE — Progress Notes (Signed)
Cardiology Clinic Note   Date: 12/27/2022 ID: [name], [name] 1945-11-19, MRN [id number]  Primary Cardiologist:  Thomasene Ripple, DO  Patient Profile    [name] is a 77 y.o. female who presents to the clinic today for routine follow up.  Past medical history significant for: Hypertension.  Hyperlipidemia. Lipid panel 11/04/2021: LDL 137, HDL 63, TG 52, total 210. Syncope.  Echo 03/11/2021: EF 65-70%. Mild LVH. Grade I DD. Normal RV function. Trivial MR.  14 day ZIO 05/04/2021: Min HR 53 bpm, max HR 155 bpm, avg HR 75 bpm. Predominant rhythm sinus. Rare PAC/PVCs. No significant arrhythmias.  Recurrent PE/DVT.  Lower extremity venous US 11/02/2021: Findings consistent with age indeterminate DVT involving right femoral vein, and right popliteal vein. No evidence of superficial venous thrombosis or popliteal cyst in bilateral lower extremities. OSA.  GERD.  CKD stage III.   History of Present Illness    [name] was first evaluated by Dr. Servando Salina on 04/12/2021 for syncope.  Echo in August 2022 showed normal LV/RV function with mild LVH and Grade I DD.  She wore 14-day ZIO which did not show any significant arrhythmias (details above).  He continues to be followed for the above outlined history.  She was last seen in the office by Dr. Servando Salina on 10/31/2021 follow-up.  She was doing well with no further episodes of syncope and no changes were made.  Today, patient is accompanied by her son. She is doing well. Patient denies shortness of breath or dyspnea on exertion. No chest pain, pressure, or tightness. Denies lower extremity edema, orthopnea, or PND. No palpitations.  She denies further episodes of presyncope or syncope. She has been dealing with a bitemporal headache for 6-7 weeks that worsens with bending forward. She was seen in urgent care and told she had sinusitis. She reports continued pressure around her eyes. She is going to follow up with PCP in June. BP today 149/77 and  136/70 at recheck. They do not typically check BP at home, as they do not have a cuff at home. They can check it a local [facility]. I do not think it is necessary to keep at log at this point.     ROS: All other systems reviewed and are otherwise negative except as noted in History of Present Illness.  Studies Reviewed    ECG is not ordered today.    Physical Exam    VS:  BP (!) 149/77   Pulse 76   Ht 5\' 4"  (1.626 m)   Wt 175 lb (79.4 kg)   SpO2 99%   BMI 30.04 kg/m  , BMI Body mass index is 30.04 kg/m.  GEN: Well nourished, well developed, in no acute distress. Neck: No JVD or carotid bruits. Cardiac:  RRR. No murmurs. No rubs or gallops.   Respiratory:  Respirations regular and unlabored. Clear to auscultation without rales, wheezing or rhonchi. GI: Soft, nontender, nondistended. Extremities: Radials/DP/PT 2+ and equal bilaterally. No clubbing or cyanosis. No edema.  Skin: Warm and dry, no rash. Neuro: Strength intact.  Assessment & Plan    Syncope.  Echo August 2022 showed normal LV/RV function, mild LVH, Grade I DD with no valvular abnormalities.  14-day ZIO September 2022 did not show any significant arrhythmias.  Patient denies any further episodes of presyncope/syncope. No palpitations.  Hypertension. BP today 149/77 on intake and 136/70 on my recheck. Patient denies dizziness or vision changes. Continue amlodipine. Hyperlipidemia.  LDL April 2023 137.  Patient is not on cholesterol medications at this time.  Defer to PCP. Recurrent PE/DVT.  Lower extremity venous ultrasound March 2023 showed age-indeterminate DVT involving right femoral vein and right popliteal vein.  Patient denies shortness of breath or DOE. No extremity swelling or pain.  Denies spontaneous bleeding concerns.  Continue Xarelto.  Followed by PCP.  Disposition: Return in 1 year or sooner as needed.          Signed, Etta Grandchild. Sharon Catanzaro, DNP, NP-C

## 2022-12-27 ENCOUNTER — Encounter: Payer: Self-pay | Admitting: Student

## 2022-12-27 ENCOUNTER — Ambulatory Visit: Payer: Medicare PPO | Attending: Cardiology | Admitting: Student

## 2022-12-27 VITALS — BP 136/70 | HR 76 | Ht 64.0 in | Wt 175.0 lb

## 2022-12-27 DIAGNOSIS — E782 Mixed hyperlipidemia: Secondary | ICD-10-CM | POA: Diagnosis not present

## 2022-12-27 DIAGNOSIS — I82409 Acute embolism and thrombosis of unspecified deep veins of unspecified lower extremity: Secondary | ICD-10-CM

## 2022-12-27 DIAGNOSIS — R55 Syncope and collapse: Secondary | ICD-10-CM

## 2022-12-27 DIAGNOSIS — I10 Essential (primary) hypertension: Secondary | ICD-10-CM

## 2022-12-27 DIAGNOSIS — I2699 Other pulmonary embolism without acute cor pulmonale: Secondary | ICD-10-CM

## 2022-12-27 NOTE — Patient Instructions (Signed)
Medication Instructions:  Your physician recommends that you continue on your current medications as directed. Please refer to the Current Medication list given to you today.  *If you need a refill on your cardiac medications before your next appointment, please call your pharmacy*   Lab Work: NONE If you have labs (blood work) drawn today and your tests are completely normal, you will receive your results only by: MyChart Message (if you have MyChart) OR A paper copy in the mail If you have any lab test that is abnormal or we need to change your treatment, we will call you to review the results.   Testing/Procedures: NONE   Follow-Up: At [facility], you and your health needs are our priority.  As part of our continuing mission to provide you with exceptional heart care, we have created designated Provider Care Teams.  These Care Teams include your primary Cardiologist (physician) and Advanced Practice Providers (APPs -  Physician Assistants and Nurse Practitioners) who all work together to provide you with the care you need, when you need it.  We recommend signing up for the patient portal called "MyChart".  Sign up information is provided on this After Visit Summary.  MyChart is used to connect with patients for Virtual Visits (Telemedicine).  Patients are able to view lab/test results, encounter notes, upcoming appointments, etc.  Non-urgent messages can be sent to your provider as well.   To learn more about what you can do with MyChart, go to [email or web address].    Your next appointment:   1 year(s)  Provider:   Kardie Tobb, DO    

## 2023-01-23 ENCOUNTER — Other Ambulatory Visit: Payer: Self-pay | Admitting: Family Medicine

## 2023-01-27 ENCOUNTER — Other Ambulatory Visit: Payer: Self-pay

## 2023-01-27 ENCOUNTER — Encounter: Payer: Self-pay | Admitting: Family Medicine

## 2023-01-27 ENCOUNTER — Ambulatory Visit (INDEPENDENT_AMBULATORY_CARE_PROVIDER_SITE_OTHER): Payer: Medicare PPO | Admitting: Family Medicine

## 2023-01-27 VITALS — BP 127/66 | HR 97 | Ht 64.0 in | Wt 170.4 lb

## 2023-01-27 DIAGNOSIS — I2699 Other pulmonary embolism without acute cor pulmonale: Secondary | ICD-10-CM

## 2023-01-27 DIAGNOSIS — J309 Allergic rhinitis, unspecified: Secondary | ICD-10-CM | POA: Diagnosis not present

## 2023-01-27 DIAGNOSIS — I10 Essential (primary) hypertension: Secondary | ICD-10-CM | POA: Diagnosis not present

## 2023-01-27 NOTE — Patient Instructions (Signed)
It was great to see you again today.  Get back on zyrtec 5mg  daily for sinuses/allergies  Otherwise continue present medications  Follow up in 6 months, sooner if needed  Be well, Dr. Pollie Meyer

## 2023-01-27 NOTE — Progress Notes (Unsigned)
  Date of Visit: 01/27/2023   SUBJECTIVE:   HPI:  [name] presents today for routine follow-up.  Recurrent PE: Currently taking Xarelto 20 mg daily.  Tolerating this well.  Denies any bleeding issues.  Also takes iron 325 mg every other day.  Hypertension: Currently taking amlodipine 5 mg daily.  Tolerating this well.  Seasonal allergies: Has had increased pressure in her sinuses recently.  Also with some congestion and drainage.  No fevers.  Ongoing for several weeks.  Has taken Zyrtec intermittently in the past and it did help.  Son says he can get more of this for her.  OBJECTIVE:   BP 127/66   Pulse 97   Ht 5\' 4"  (1.626 m)   Wt 170 lb 6.4 oz (77.3 kg)   SpO2 100%   BMI 29.25 kg/m  Gen: No acute distress, pleasant, cooperative, well-appearing HEENT: Normocephalic, atraumatic, nares with mild irritation of nasal mucosa, oropharynx clear and moist, tympanic membranes normal bilaterally, no anterior cervical lymphadenopathy Heart: Regular rate and rhythm, no murmur Lungs: Clear to auscultation bilaterally, normal effort Neuro: Grossly nonfocal, speech normal Ext: No edema  ASSESSMENT/PLAN:   Health maintenance:  -Planning to get second dose of Shingrix at her pharmacy  Recurrent pulmonary embolism (HCC) Doing well, continue current anticoagulant with Xarelto.  Hypertension Well-controlled, continue amlodipine.  Allergic rhinitis Advised son to restart Zyrtec as this was previously helping.  Follow-up if not improving.  FOLLOW UP: Follow up in 6 months for routine medical issues  [place] J. Pollie Meyer, MD [facility]

## 2023-01-29 DIAGNOSIS — J309 Allergic rhinitis, unspecified: Secondary | ICD-10-CM | POA: Insufficient documentation

## 2023-01-29 NOTE — Assessment & Plan Note (Signed)
Well controlled, continue amlodipine.

## 2023-01-29 NOTE — Assessment & Plan Note (Signed)
Doing well, continue current anticoagulant with Xarelto.

## 2023-01-29 NOTE — Assessment & Plan Note (Addendum)
Advised son to restart Zyrtec as this was previously helping.  Follow-up if not improving.

## 2023-02-11 ENCOUNTER — Other Ambulatory Visit: Payer: Self-pay | Admitting: Family Medicine

## 2023-04-10 ENCOUNTER — Telehealth: Payer: Self-pay

## 2023-04-10 NOTE — Telephone Encounter (Signed)
Son calls nurse line requesting to speak with PCP.   He reports he would like to discuss with her starting a new medication. He reports she has been "more aggressive" than usual. He reports mildly "running out of patience." He reports he has noticed symptoms more in the morning and denies irritability "all day."   PCP does not have any apt until the end of September. He would like to start something before then.   Will forward to PCP for advisement.

## 2023-04-14 NOTE — Telephone Encounter (Signed)
Attempted to return call to son, no answer, Left HIPAA-compliant voicemail asking him to call back I would need to see patient in office before starting medications for behavior, since that has not been an issue for her in the past but I am happy to speak with him on the phone to hear his concerns.  Latrelle Dodrill, MD

## 2023-04-14 NOTE — Telephone Encounter (Signed)
Son called back Reports his mom has occasionally gotten very irritated or agitated over small things, not every day He is interested in trying some medication with minimal side effects to help her be less agitated  Advised I would need to see patient in office and speak with her first before doing that I scheduled them a visit for 10/3 at 11:10a  Son appreciative  Latrelle Dodrill, MD

## 2023-05-08 ENCOUNTER — Encounter: Payer: Self-pay | Admitting: Family Medicine

## 2023-05-08 ENCOUNTER — Ambulatory Visit (INDEPENDENT_AMBULATORY_CARE_PROVIDER_SITE_OTHER): Payer: Medicare HMO | Admitting: Family Medicine

## 2023-05-08 ENCOUNTER — Other Ambulatory Visit: Payer: Self-pay

## 2023-05-08 VITALS — BP 123/64 | HR 66 | Ht 64.0 in | Wt 168.6 lb

## 2023-05-08 DIAGNOSIS — Z23 Encounter for immunization: Secondary | ICD-10-CM | POA: Diagnosis not present

## 2023-05-08 DIAGNOSIS — Z13220 Encounter for screening for lipoid disorders: Secondary | ICD-10-CM | POA: Diagnosis not present

## 2023-05-08 DIAGNOSIS — F439 Reaction to severe stress, unspecified: Secondary | ICD-10-CM

## 2023-05-08 DIAGNOSIS — I2699 Other pulmonary embolism without acute cor pulmonale: Secondary | ICD-10-CM | POA: Diagnosis not present

## 2023-05-08 MED ORDER — SERTRALINE HCL 25 MG PO TABS: 25.0000 mg | ORAL_TABLET | Freq: Every day | ORAL | 1 refills | Status: DC

## 2023-05-08 NOTE — Patient Instructions (Signed)
It was great to see you again today.  Start sertraline 25mg  daily Follow up with me in 1 month, sooner if needed  Be well, Dr. Pollie Meyer

## 2023-05-08 NOTE — Assessment & Plan Note (Signed)
Doing well. Currently on Xarelto. CBC ordered today.

## 2023-05-08 NOTE — Progress Notes (Signed)
    SUBJECTIVE:   CHIEF COMPLAINT / HPI:   [name] is a 77 y.o. female presenting with her son and daughter to discuss agitation. Hx provided by both son and patient.  Agitation: Patient's son reports increased stressors for patient recently related to her other son being in the hospital for about 10 months, taking care of child with Down Syndrome (daughter who accompanies patient to visit today) and recent spam calls that has lead to patient being threatened. Reports he has called [employer] and only has certain phone numbers that will go through to patient's house given recent events.  Additionally, patient reports recent feelings of anxiety and sadness. Patient reports she was very close with her sister and still grieves her loss. Mentioned her sister's birthday is next month. Patient denies SI, HI or hx of mood disorder. Patient reports memory is intact.   Of note, patient was seen in clinic on 02/14/22 for memory impairment and had a MoCA score of 18/30.  PERTINENT  PMH / PSH: HTN, Hx of PE, CKD, Hyperlipidemia  OBJECTIVE:   BP 123/64   Pulse 66   Ht 5\' 4"  (1.626 m)   Wt 168 lb 9.6 oz (76.5 kg)   SpO2 100%   BMI 28.94 kg/m    General: NAD, pleasant, cooperative Cardiac: RRR, no murmurs Respiratory: Clear to auscultation bilaterally, normal work of breathing Extremities: 2+ dp equal bilaterally Psych: normal range of affect, well groomed, speech normal in rate and volume, normal eye contact   ASSESSMENT/PLAN:   Stress Suspect recent feelings of agitation and anxiety are secondary to situational factors related to son's health, care-taking responsibilities and recent scam-caller threats. Additionally, patient reports some sadness related to grief with loss of sister. Medical causes considered include thyroid etiology or electrolyte imbalance. Will order TSH and BMP. Trial of sertraline 25 mg sent to pharmacy. Patient advised medication may take time to improve symptoms.  Patient interested in seeing a therapist. Resources to be provided to patient regarding local therapists.   Hyperlipidemia Last lipid panel 11/04/21. Lipid panel ordered today.  Recurrent pulmonary embolism (HCC) Doing well. Currently on Xarelto. CBC ordered today.    Health Maintenance: - Flu vaccine offered; given today - COVID-19 vaccine offered; given today - Shingles vaccine discussed; patient's son agrees to assist patient to get vaccine    FOLLOW-UP: Patient to follow up in 1 month to discuss mood or sooner if needed  Bubba Hales, Medical Student [facility]  Patient seen along with medical student Filomena Jungling. I personally evaluated this patient along with the student, and verified all aspects of the history, physical exam, and medical decision making as documented by the student. I agree with the student's documentation and have made all necessary edits.  Levert Feinstein, MD  [facility]

## 2023-05-08 NOTE — Assessment & Plan Note (Addendum)
Suspect recent feelings of agitation and anxiety are secondary to situational factors related to son's health, care-taking responsibilities and recent scam-caller threats. Additionally, patient reports some sadness related to grief with loss of sister. Medical causes considered include thyroid etiology or electrolyte imbalance. Will order TSH and BMP. Trial of sertraline 25 mg sent to pharmacy. Patient advised medication may take time to improve symptoms. Patient interested in seeing a therapist. Resources to be provided to patient regarding local therapists.

## 2023-05-08 NOTE — Assessment & Plan Note (Signed)
Last lipid panel 11/04/21. Lipid panel ordered today.

## 2023-05-09 LAB — LIPID PANEL
Chol/HDL Ratio: 2.9 {ratio} (ref 0.0–4.4)
Cholesterol, Total: 209 mg/dL — ABNORMAL HIGH (ref 100–199)
HDL: 73 mg/dL (ref >39)
LDL Chol Calc (NIH): 122 mg/dL — ABNORMAL HIGH (ref 0–99)
Triglycerides: 80 mg/dL (ref 0–149)
VLDL Cholesterol Cal: 14 mg/dL (ref 5–40)

## 2023-05-09 LAB — CBC
Hematocrit: 37.7 % (ref 34.0–46.6)
Hemoglobin: 12 g/dL (ref 11.1–15.9)
MCH: 28.2 pg (ref 26.6–33.0)
MCHC: 31.8 g/dL (ref 31.5–35.7)
MCV: 89 fL (ref 79–97)
Platelets: 273 10*3/uL (ref 150–450)
RBC: 4.25 x10E6/uL (ref 3.77–5.28)
RDW: 14.5 % (ref 11.7–15.4)
WBC: 4.5 10*3/uL (ref 3.4–10.8)

## 2023-05-09 LAB — BASIC METABOLIC PANEL
BUN/Creatinine Ratio: 25 (ref 12–28)
BUN: 23 mg/dL (ref 8–27)
CO2: 24 mmol/L (ref 20–29)
Calcium: 10 mg/dL (ref 8.7–10.3)
Chloride: 105 mmol/L (ref 96–106)
Creatinine, Ser: 0.91 mg/dL (ref 0.57–1.00)
Glucose: 93 mg/dL (ref 70–99)
Potassium: 4.2 mmol/L (ref 3.5–5.2)
Sodium: 142 mmol/L (ref 134–144)
eGFR: 65 mL/min/{1.73_m2} (ref >59)

## 2023-05-09 LAB — TSH RFX ON ABNORMAL TO FREE T4: TSH: 1.08 u[IU]/mL (ref 0.450–4.500)

## 2023-05-13 DIAGNOSIS — Z7901 Long term (current) use of anticoagulants: Secondary | ICD-10-CM | POA: Diagnosis not present

## 2023-05-13 DIAGNOSIS — Z8249 Family history of ischemic heart disease and other diseases of the circulatory system: Secondary | ICD-10-CM | POA: Diagnosis not present

## 2023-05-13 DIAGNOSIS — Z008 Encounter for other general examination: Secondary | ICD-10-CM | POA: Diagnosis not present

## 2023-05-13 DIAGNOSIS — I10 Essential (primary) hypertension: Secondary | ICD-10-CM | POA: Diagnosis not present

## 2023-05-13 DIAGNOSIS — G3184 Mild cognitive impairment, so stated: Secondary | ICD-10-CM | POA: Diagnosis not present

## 2023-05-13 DIAGNOSIS — Z833 Family history of diabetes mellitus: Secondary | ICD-10-CM | POA: Diagnosis not present

## 2023-05-13 DIAGNOSIS — Z86711 Personal history of pulmonary embolism: Secondary | ICD-10-CM | POA: Diagnosis not present

## 2023-05-13 DIAGNOSIS — Z809 Family history of malignant neoplasm, unspecified: Secondary | ICD-10-CM | POA: Diagnosis not present

## 2023-05-13 DIAGNOSIS — Z86718 Personal history of other venous thrombosis and embolism: Secondary | ICD-10-CM | POA: Diagnosis not present

## 2023-05-13 DIAGNOSIS — M1990 Unspecified osteoarthritis, unspecified site: Secondary | ICD-10-CM | POA: Diagnosis not present

## 2023-05-13 DIAGNOSIS — R2689 Other abnormalities of gait and mobility: Secondary | ICD-10-CM | POA: Diagnosis not present

## 2023-05-30 ENCOUNTER — Other Ambulatory Visit: Payer: Self-pay | Admitting: Family Medicine

## 2023-06-03 ENCOUNTER — Encounter: Payer: Self-pay | Admitting: Family Medicine

## 2023-06-17 ENCOUNTER — Encounter: Payer: Self-pay | Admitting: Family Medicine

## 2023-06-17 ENCOUNTER — Ambulatory Visit: Payer: Medicare HMO | Admitting: Family Medicine

## 2023-06-17 VITALS — BP 110/72 | HR 88 | Ht 64.0 in | Wt 162.8 lb

## 2023-06-17 DIAGNOSIS — Z0000 Encounter for general adult medical examination without abnormal findings: Secondary | ICD-10-CM

## 2023-06-17 DIAGNOSIS — E782 Mixed hyperlipidemia: Secondary | ICD-10-CM | POA: Diagnosis not present

## 2023-06-17 DIAGNOSIS — F439 Reaction to severe stress, unspecified: Secondary | ICD-10-CM

## 2023-06-17 MED ORDER — ATORVASTATIN CALCIUM 40 MG PO TABS: 40.0000 mg | ORAL_TABLET | Freq: Every day | ORAL | 3 refills | Status: DC

## 2023-06-17 MED ORDER — SERTRALINE HCL 25 MG PO TABS: 25.0000 mg | ORAL_TABLET | Freq: Every day | ORAL | 1 refills | Status: DC

## 2023-06-17 NOTE — Patient Instructions (Signed)
It was great to see you again today.  Sent in refills of your medicine Continue on the sertraline 25 mg daily Reordered atorvastatin (Lipitor) to reduce your risk of cardiovascular disease  Follow-up in 3 months, sooner if needed  Be well, Dr. Pollie Meyer

## 2023-06-17 NOTE — Progress Notes (Unsigned)
    SUBJECTIVE:   CHIEF COMPLAINT / HPI:   [name] is a 77 year old female presenting with her son and daughter for a follow-up visit regarding mood liability.   Mood: Since starting Zoloft, patient's son says she has had a remarkable improvement. She is back to cooking and cleaning. Patient says she feels great and "can't complain." Patient denies any side effects. Denies SI/HI.  HLD: Currently not taking any medications but amenable to restarting statin.  Health maintenance: Son states she had the 1st dose of Shingles vaccine, has not gotten 2nd dose.  PERTINENT  PMH / PSH: History of PE, HLD  OBJECTIVE:   BP 110/72   Pulse 88   Ht 5\' 4"  (1.626 m)   Wt 162 lb 12.8 oz (73.8 kg)   SpO2 100%   BMI 27.94 kg/m   General: Well-appearing, cooperative CV: RRR, no m/r/g Pulm: CTAB Psych: pleasant affect, well groomed  ASSESSMENT/PLAN:   Assessment & Plan Stress Patient's mood seems to have improved significantly on Zoloft 25mg  daily. Patient reports positive mood which is congruent with her affect on exam. Will continue this regimen.  Mixed hyperlipidemia Uncontrolled based on recent results from 10/3 which show LDL of 122 and total Cholesterol of 209. Based on an elevated ASCVD risk and given age and history of PE,  shared decision making was used to decide to restart a statin. Patient started on lipitor 40 mg today Routine adult health maintenance Reminded to get 2nd dose of shingrix    Oscar La, Medical Student [facility]   Patient seen along with medical student  [place]. I personally evaluated this patient along with the student, and verified all aspects of the history, physical exam, and medical decision making as documented by the student. I agree with the student's documentation and have made all necessary edits.  Latrelle Dodrill, MD, IBCLC [facility]

## 2023-06-19 DIAGNOSIS — Z0000 Encounter for general adult medical examination without abnormal findings: Secondary | ICD-10-CM | POA: Insufficient documentation

## 2023-06-19 NOTE — Assessment & Plan Note (Signed)
Patient's mood seems to have improved significantly on Zoloft 25mg  daily. Patient reports positive mood which is congruent with her affect on exam. Will continue this regimen.

## 2023-06-19 NOTE — Assessment & Plan Note (Signed)
Reminded to get 2nd dose of shingrix

## 2023-06-19 NOTE — Assessment & Plan Note (Signed)
Uncontrolled based on recent results from 10/3 which show LDL of 122 and total Cholesterol of 209. Based on an elevated ASCVD risk and given age and history of PE,  shared decision making was used to decide to restart a statin. Patient started on lipitor 40 mg today

## 2023-08-21 ENCOUNTER — Telehealth: Payer: Self-pay

## 2023-08-21 NOTE — Telephone Encounter (Signed)
We can increase the dose but I need to speak with patient first herself about it. Can they schedule a virtual visit with me to discuss? Thanks Latrelle Dodrill, MD

## 2023-08-21 NOTE — Telephone Encounter (Signed)
Son returns call to nurse line.   Advised on the need of visit with PCP.   He reports they already have a visit scheduled with her on 2/13.  He reports they will wait until then.

## 2023-08-21 NOTE — Telephone Encounter (Signed)
Tried contacting patient's son [name], he did not answer. LVM informing him to call back. Penni Bombard CMA

## 2023-08-21 NOTE — Telephone Encounter (Signed)
Patient's son, [name], calls nurse line regarding patient.   Son is asking if Zoloft dosage can be increased. Feels that medication is not working as well as it was and is asking if provider can increase the dosage.   Patient has PCP follow up on 09/18/23.  Will forward request to PCP.   Veronda Prude, RN

## 2023-09-01 ENCOUNTER — Other Ambulatory Visit: Payer: Self-pay | Admitting: Family Medicine

## 2023-09-07 ENCOUNTER — Emergency Department (HOSPITAL_BASED_OUTPATIENT_CLINIC_OR_DEPARTMENT_OTHER)
Admission: EM | Admit: 2023-09-07 | Discharge: 2023-09-07 | Disposition: A | Discharge status: Home / Self Care | Payer: No Typology Code available for payment source | Attending: Emergency Medicine | Admitting: Emergency Medicine

## 2023-09-07 ENCOUNTER — Emergency Department (HOSPITAL_BASED_OUTPATIENT_CLINIC_OR_DEPARTMENT_OTHER): Payer: No Typology Code available for payment source

## 2023-09-07 ENCOUNTER — Encounter (HOSPITAL_BASED_OUTPATIENT_CLINIC_OR_DEPARTMENT_OTHER): Payer: Self-pay | Admitting: Emergency Medicine

## 2023-09-07 ENCOUNTER — Other Ambulatory Visit: Payer: Self-pay

## 2023-09-07 DIAGNOSIS — M79671 Pain in right foot: Secondary | ICD-10-CM | POA: Diagnosis not present

## 2023-09-07 DIAGNOSIS — Z79899 Other long term (current) drug therapy: Secondary | ICD-10-CM | POA: Insufficient documentation

## 2023-09-07 DIAGNOSIS — M7731 Calcaneal spur, right foot: Secondary | ICD-10-CM | POA: Diagnosis not present

## 2023-09-07 DIAGNOSIS — M109 Gout, unspecified: Secondary | ICD-10-CM | POA: Diagnosis not present

## 2023-09-07 MED ORDER — NAPROXEN 250 MG PO TABS
500.0000 mg | ORAL_TABLET | Freq: Once | ORAL | Status: AC
  Administered 2023-09-07: 500 mg via ORAL
  Filled 2023-09-07: qty 2

## 2023-09-07 MED ORDER — METHYLPREDNISOLONE 4 MG PO TBPK: ORAL_TABLET | ORAL | 0 refills | Status: DC

## 2023-09-07 NOTE — ED Provider Notes (Signed)
[place] EMERGENCY DEPARTMENT AT [facility] Provider Note   CSN: [id number] Arrival date & time: 09/07/23  1340     History  No chief complaint on file.   [name] is a 78 y.o. female.  This is a 78 year old female with a history of gout who is here today for pain in her right foot.  Patient's previously had gout, says this feels similar.  Pain began about 3 days ago after she ate some fish.  No fever, no chills.  Denies any trauma to the area.        Home Medications Prior to Admission medications   Medication Sig Start Date End Date Taking? Authorizing Provider  methylPREDNISolone (MEDROL DOSEPAK) 4 MG TBPK tablet Take as instructed by dose packaging. 09/07/23  Yes Arletha Pili, DO  acetaminophen (TYLENOL) 650 MG CR tablet Take 1,300 mg by mouth every 8 (eight) hours as needed for pain.    [provider]  amLODipine (NORVASC) 5 MG tablet TAKE 1 TABLET BY MOUTH EVERYDAY AT BEDTIME 02/11/23   Latrelle Dodrill, MD  atorvastatin (LIPITOR) 40 MG tablet Take 1 tablet (40 mg total) by mouth daily. 06/17/23   Latrelle Dodrill, MD  ferrous sulfate 325 (65 FE) MG tablet TAKE 1 TABLET BY MOUTH EVERY OTHER DAY 09/03/23   Latrelle Dodrill, MD  sertraline (ZOLOFT) 25 MG tablet Take 1 tablet (25 mg total) by mouth daily. 06/17/23   Latrelle Dodrill, MD  XARELTO 20 MG TABS tablet TAKE 1 TABLET BY MOUTH EVERY DAY 02/11/23   Latrelle Dodrill, MD  Zoster Vaccine Adjuvanted [facility]) injection Administer Shingrix vaccination now and repeat in two months 12/18/21   Latrelle Dodrill, MD      Allergies    Patient has no known allergies.    Review of Systems   Review of Systems  Physical Exam Updated Vital Signs BP (!) 149/70 (BP Location: Right Arm)   Pulse 74   Temp 98.1 F (36.7 C) (Oral)   Resp 14   SpO2 96%  Physical Exam Vitals reviewed.  Constitutional:      Appearance: She is not toxic-appearing.  Musculoskeletal:     Comments:  No swelling of the right tibial tarsal joint, however patient has tenderness with palpation and movement.  No swelling of the foot, no redness, no crepitus.     ED Results / Procedures / Treatments   Labs (all labs ordered are listed, but only abnormal results are displayed) Labs Reviewed - No data to display  EKG None  Radiology DG Foot 2 Views Right Result Date: 09/07/2023 CLINICAL DATA:  Right foot pain EXAM: RIGHT FOOT - 2 VIEW COMPARISON:  11/19/2022 FINDINGS: There is no evidence of fracture or dislocation. Joint spaces are relatively well preserved. No erosions. Small plantar calcaneal spur. Atherosclerotic vascular calcifications. No focal soft tissue swelling. IMPRESSION: Negative. Electronically Signed   By: Duanne Guess D.O.   On: 09/07/2023 15:38    Procedures Procedures    Medications Ordered in ED Medications  naproxen (NAPROSYN) tablet 500 mg (has no administration in time range)    ED Course/ Medical Decision Making/ A&P                                 Medical Decision Making 78 year old female here today with right foot pain.  Differential diagnose include gout, septic arthritis,.  Less likely DVT.  Plan #  patient's exam and history is most consistent with gout.  She has had issues with this joint in the past.  Will obtain plain films.  Look back to the patient's labs, no history of renal dysfunction.  Patient not systemically ill.  Unlikely to be septic arthritis.  Exam not consistent with DVT.  Reassessment 3:45 PM-patient's plain films negative.  Patient is advanced age, will prescribe her with medrol for her gout flare.  Amount and/or Complexity of Data Reviewed Radiology: ordered.  Risk Prescription drug management.           Final Clinical Impression(s) / ED Diagnoses Final diagnoses:  Acute gout of right ankle, unspecified cause    Rx / DC Orders ED Discharge Orders          Ordered    methylPREDNISolone (MEDROL DOSEPAK) 4 MG  TBPK tablet        09/07/23 1549              Anders Simmonds T, DO 09/07/23 1549

## 2023-09-07 NOTE — ED Triage Notes (Signed)
Right foot  started hurting 3 days ago, suspect gout

## 2023-09-07 NOTE — Discharge Instructions (Addendum)
Based on your symptoms today, I think that your foot pain is due to gout.  Your x-ray of your foot was negative.  I have sent you with a prescription for medication called Medrol.  You should take this as the dose packaging instructs.  You can also take 1000 g of Tylenol every 8 hours.  Please follow-up with your primary care doctor.

## 2023-09-14 ENCOUNTER — Emergency Department (HOSPITAL_COMMUNITY)
Admission: EM | Admit: 2023-09-14 | Discharge: 2023-09-14 | Disposition: A | Discharge status: Home / Self Care | Payer: No Typology Code available for payment source | Attending: Emergency Medicine | Admitting: Emergency Medicine

## 2023-09-14 ENCOUNTER — Emergency Department (HOSPITAL_COMMUNITY): Payer: No Typology Code available for payment source

## 2023-09-14 ENCOUNTER — Encounter (HOSPITAL_COMMUNITY): Payer: Self-pay | Admitting: *Deleted

## 2023-09-14 ENCOUNTER — Other Ambulatory Visit: Payer: Self-pay

## 2023-09-14 DIAGNOSIS — R519 Headache, unspecified: Secondary | ICD-10-CM | POA: Diagnosis not present

## 2023-09-14 DIAGNOSIS — Z79899 Other long term (current) drug therapy: Secondary | ICD-10-CM | POA: Insufficient documentation

## 2023-09-14 DIAGNOSIS — R42 Dizziness and giddiness: Secondary | ICD-10-CM | POA: Diagnosis not present

## 2023-09-14 DIAGNOSIS — I10 Essential (primary) hypertension: Secondary | ICD-10-CM | POA: Insufficient documentation

## 2023-09-14 DIAGNOSIS — R079 Chest pain, unspecified: Secondary | ICD-10-CM | POA: Diagnosis not present

## 2023-09-14 DIAGNOSIS — I959 Hypotension, unspecified: Secondary | ICD-10-CM | POA: Diagnosis not present

## 2023-09-14 DIAGNOSIS — Z7901 Long term (current) use of anticoagulants: Secondary | ICD-10-CM | POA: Insufficient documentation

## 2023-09-14 DIAGNOSIS — R064 Hyperventilation: Secondary | ICD-10-CM | POA: Diagnosis not present

## 2023-09-14 DIAGNOSIS — G4489 Other headache syndrome: Secondary | ICD-10-CM | POA: Diagnosis not present

## 2023-09-14 DIAGNOSIS — R110 Nausea: Secondary | ICD-10-CM | POA: Insufficient documentation

## 2023-09-14 LAB — CBC
HCT: 37.7 % (ref 36.0–46.0)
Hemoglobin: 12.3 g/dL (ref 12.0–15.0)
MCH: 29.2 pg (ref 26.0–34.0)
MCHC: 32.6 g/dL (ref 30.0–36.0)
MCV: 89.5 fL (ref 80.0–100.0)
Platelets: 384 10*3/uL (ref 150–400)
RBC: 4.21 MIL/uL (ref 3.87–5.11)
RDW: 14.7 % (ref 11.5–15.5)
WBC: 7.9 10*3/uL (ref 4.0–10.5)
nRBC: 0 % (ref 0.0–0.2)

## 2023-09-14 LAB — PROTIME-INR
INR: 1.2 (ref 0.8–1.2)
Prothrombin Time: 15.1 s (ref 11.4–15.2)

## 2023-09-14 LAB — BASIC METABOLIC PANEL
Anion gap: 12 (ref 5–15)
BUN: 13 mg/dL (ref 8–23)
CO2: 23 mmol/L (ref 22–32)
Calcium: 9.9 mg/dL (ref 8.9–10.3)
Chloride: 102 mmol/L (ref 98–111)
Creatinine, Ser: 0.97 mg/dL (ref 0.44–1.00)
GFR, Estimated: >60 mL/min (ref >60)
Glucose, Bld: 99 mg/dL (ref 70–99)
Potassium: 4 mmol/L (ref 3.5–5.1)
Sodium: 137 mmol/L (ref 135–145)

## 2023-09-14 LAB — TROPONIN I (HIGH SENSITIVITY)
Troponin I (High Sensitivity): 3 ng/L (ref <18)
Troponin I (High Sensitivity): 4 ng/L (ref <18)

## 2023-09-14 MED ORDER — SODIUM CHLORIDE 0.9 % IV BOLUS
1000.0000 mL | Freq: Once | INTRAVENOUS | Status: AC
  Administered 2023-09-14: 1000 mL via INTRAVENOUS

## 2023-09-14 MED ORDER — ACETAMINOPHEN 500 MG PO TABS
1000.0000 mg | ORAL_TABLET | Freq: Once | ORAL | Status: AC
  Administered 2023-09-14: 1000 mg via ORAL
  Filled 2023-09-14: qty 2

## 2023-09-14 MED ORDER — METOCLOPRAMIDE HCL 5 MG/ML IJ SOLN
5.0000 mg | Freq: Once | INTRAMUSCULAR | Status: AC
  Administered 2023-09-14: 5 mg via INTRAVENOUS
  Filled 2023-09-14: qty 2

## 2023-09-14 MED ORDER — ONDANSETRON HCL 4 MG/2ML IJ SOLN
4.0000 mg | Freq: Once | INTRAMUSCULAR | Status: AC
  Administered 2023-09-14: 4 mg via INTRAVENOUS
  Filled 2023-09-14: qty 2

## 2023-09-14 NOTE — ED Provider Triage Note (Signed)
 Emergency Medicine Provider Triage Evaluation Note  MOOREA BOISSONNEAULT , a 78 y.o. female  was evaluated in triage.  Pt complains of chest pain and lightheadedness.  Onset this morning.  Review of Systems  Positive: Chest pain Negative: Fevers  Physical Exam  BP 124/60 (BP Location: Right Arm)   Pulse 68   Temp 98.5 F (36.9 C) (Oral)   Resp 15   Ht 5' 4 (1.626 m)   Wt 73.8 kg   SpO2 97%   BMI 27.93 kg/m  Gen:   Awake, no distress   Resp:  Normal effort  MSK:   Moves extremities without difficulty  Other:  No motor deficits normal sensation in extremities  Medical Decision Making  Medically screening exam initiated at 1:29 PM.  Appropriate orders placed.  ELDORIS BEISER was informed that the remainder of the evaluation will be completed by another provider, this initial triage assessment does not replace that evaluation, and the importance of remaining in the ED until their evaluation is complete.  78 year old presenting emergency department with lightheadedness, and chest pain.  No localizing neurodeficits.   Neysa Caron PARAS, DO 09/14/23 1331

## 2023-09-14 NOTE — Discharge Instructions (Addendum)
 You were seen today for dizziness.  I did recommend some additional CT imaging which we discussed and you and your family declined.  I recommend you follow close with your primary care doctor.  If you develop any new or concerning symptoms including chest pain, shortness of breath, dizziness I does not go away or any strokelike symptoms you need to return to the ED.

## 2023-09-14 NOTE — ED Triage Notes (Signed)
 Pt woke up at 4 am with chest pain, headache and dizziness.  Both headache and chest pain are R sided.  Pt was having a panic attack once fire arrived and was placed temporarily on. En-route, pt began c/o posterior R knee pain.  Hx of blood clots.  Given 1 0.4 mg sublingual nitroglycerin and 324 asa with minimal relief.

## 2023-09-14 NOTE — ED Notes (Signed)
 Patient is in 2B stated she was getting dizzier and like she was going pass out

## 2023-09-14 NOTE — ED Provider Notes (Signed)
 [place] EMERGENCY DEPARTMENT AT [facility] Provider Note   CSN: [id number] Arrival date & time: 09/14/23  1311     History  Chief Complaint  Patient presents with   Dizziness    [name] is a 78 y.o. female.   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  Patient states around 4 AM she woke up and she had right-sided headache, dizziness which she describes as feeling like she does not pass out, feeling like she is spinning, and feeling nauseous.  Has been intermittent, somewhat worse with head movement.  She is not any focal weakness or numbness or vision changes or difficulty with speech.  Chest pain has not subsided.  She does feel short of breath but this is not new.  She states she has a history of recurrent PE, but has not missed any doses of her blood thinners.  She is quite anxious.  No exertional component to her chest pain.  No abdominal pain.  Some nausea but no vomiting or diarrhea.  Has been eating and drinking normally.     Home Medications Prior to Admission medications   Medication Sig Start Date End Date Taking? Authorizing Provider  acetaminophen  (TYLENOL ) 650 MG CR tablet Take 1,300 mg by mouth every 8 (eight) hours as needed for pain.    [provider]  amLODipine  (NORVASC ) 5 MG tablet TAKE 1 TABLET BY MOUTH EVERYDAY AT BEDTIME 02/11/23   Donah Laymon PARAS, MD  atorvastatin  (LIPITOR) 40 MG tablet Take 1 tablet (40 mg total) by mouth daily. 06/17/23   Donah Laymon PARAS, MD  ferrous sulfate  325 (65 FE) MG tablet TAKE 1 TABLET BY MOUTH EVERY OTHER DAY 09/03/23   Donah Laymon PARAS, MD  methylPREDNISolone  (MEDROL  DOSEPAK) 4 MG TBPK tablet Take as instructed by dose packaging. 09/07/23   Mannie Pac T, DO  sertraline  (ZOLOFT ) 25 MG tablet Take 1 tablet (25 mg total) by mouth daily. 06/17/23   Donah Laymon PARAS, MD   XARELTO  20 MG TABS tablet TAKE 1 TABLET BY MOUTH EVERY DAY 02/11/23   Donah Laymon PARAS, MD  Zoster Vaccine Adjuvanted (SHINGRIX ) injection Administer Shingrix  vaccination now and repeat in two months 12/18/21   Donah Laymon PARAS, MD      Allergies    Patient has no known allergies.    Review of Systems   Review of Systems  Neurological:  Positive for dizziness.  Review of systems completed and notable as per HPI.  ROS otherwise negative.   Physical Exam Updated Vital Signs BP 124/66   Pulse 67   Temp 98 F (36.7 C) (Oral)   Resp 19   Ht 5' 4 (1.626 m)   Wt 73.8 kg   SpO2 100%   BMI 27.93 kg/m  Physical Exam Vitals and nursing note reviewed.  Constitutional:      General: She is not in acute distress.    Appearance: She is well-developed.  HENT:     Head: Normocephalic and atraumatic.     Nose: Nose normal.     Mouth/Throat:     Mouth: Mucous membranes are moist.     Pharynx: Oropharynx is clear.  Eyes:     Extraocular Movements: Extraocular movements intact.     Conjunctiva/sclera: Conjunctivae normal.     Pupils: Pupils are equal, round, and reactive to light.  Cardiovascular:     Rate and Rhythm: Normal rate and regular rhythm.  Pulses: Normal pulses.     Heart sounds: Normal heart sounds. No murmur heard. Pulmonary:     Effort: Pulmonary effort is normal. No respiratory distress.     Breath sounds: Normal breath sounds.  Abdominal:     Palpations: Abdomen is soft.     Tenderness: There is no abdominal tenderness.  Musculoskeletal:        General: No swelling.     Cervical back: Normal range of motion and neck supple. No rigidity or tenderness.     Right lower leg: No edema.     Left lower leg: No edema.  Skin:    General: Skin is warm and dry.     Capillary Refill: Capillary refill takes less than 2 seconds.  Neurological:     General: No focal deficit present.     Mental Status: She is alert and oriented to person, place, and time. Mental  status is at baseline.     Cranial Nerves: No cranial nerve deficit.     Sensory: No sensory deficit.     Motor: No weakness.     Coordination: Coordination normal.     Comments: Awake and alert.  Oriented to person, place, time, situation.  Normal strength and sensation all extremities.  Normal finger-to-nose bilaterally.  No visual field cuts.  No aphasia, no neglect.  Normal speech.  Psychiatric:        Mood and Affect: Mood normal.     ED Results / Procedures / Treatments   Labs (all labs ordered are listed, but only abnormal results are displayed) Labs Reviewed  BASIC METABOLIC PANEL  CBC  PROTIME-INR  URINALYSIS, ROUTINE W REFLEX MICROSCOPIC  TROPONIN I (HIGH SENSITIVITY)  TROPONIN I (HIGH SENSITIVITY)    EKG EKG Interpretation Date/Time:  Sunday September 14 2023 13:24:27 EST Ventricular Rate:  72 PR Interval:  160 QRS Duration:  98 QT Interval:  404 QTC Calculation: 442 R Axis:   -48  Text Interpretation: Normal sinus rhythm Incomplete right bundle branch block Left anterior fascicular block Abnormal ECG When compared with ECG of 07-Dec-2022 11:07, PREVIOUS ECG IS PRESENT No significant change since last tracing Confirmed by Nicholaus Dolphin [phone]) on 09/14/2023 3:04:24 PM  Radiology DG Chest 2 View Result Date: 09/14/2023 CLINICAL DATA:  Chest pain, headache, dizziness EXAM: CHEST - 2 VIEW COMPARISON:  09/14/2022 FINDINGS: Frontal and lateral views of the chest demonstrate a stable cardiac silhouette. No airspace disease, effusion, or pneumothorax. No acute bony abnormalities. IMPRESSION: 1. No acute intrathoracic process. Electronically Signed   By: Ozell Daring M.D.   On: 09/14/2023 15:26    Procedures Procedures    Medications Ordered in ED Medications  ondansetron  (ZOFRAN ) injection 4 mg (4 mg Intravenous Given 09/14/23 1521)  sodium chloride  0.9 % bolus 1,000 mL (0 mLs Intravenous Stopped 09/14/23 1625)  acetaminophen  (TYLENOL ) tablet 1,000 mg (1,000 mg Oral  Given 09/14/23 1520)  metoCLOPramide  (REGLAN ) injection 5 mg (5 mg Intravenous Given 09/14/23 1626)    ED Course/ Medical Decision Making/ A&P Clinical Course as of 09/14/23 1739  Sun Sep 14, 2023  1613 On reassessment daughters at bedside.  Daughter states they are actually more concerned because patient was having labored breathing earlier.  They are concerned about PE.  Patient now is not dizzy at all, she still has some headache.  Will change order to a noncontrast CT head rule out mass or intracranial bleeding, lower concern for central vertigo given complete resolution of dizziness and will evaluate for PE given  her history of recurrent PE with CTA chest. [JD]    Clinical Course User Index [JD] Nicholaus Cassondra DEL, MD                                 Medical Decision Making Amount and/or Complexity of Data Reviewed Labs: ordered. Radiology: ordered.  Risk OTC drugs. Prescription drug management.   Medical Decision Making:   GRACEANNE GUIN is a 78 y.o. female who presented to the ED today with chest pain, headache, dizziness.  All signs reviewed.  On exam she feels quite dizzy but chest pain is resolved.  EKG is nonischemic, no show troponin is normal have lower concern for ACS.  She is already anticoagulated and adherent to her regimen without tachycardia, hypoxia or new shortness of breath and chest pain is resolved and lower concern for PE or anticoagulation failure.  She is mostly concerned about this dizziness.  She has not hard time describing it, so not sound like presyncope symptoms sounds more like vertigo.  She had 1 visit last year for vertigo.  Could be peripheral, however given her risk factors will obtain CTA to evaluate for dissection, vascular abnormality.  She is outside of TNK window and has no deficits on exam.  Does not meet LVO criteria.   Patient placed on continuous vitals and telemetry monitoring while in ED which was reviewed periodically.  Reviewed and confirmed  nursing documentation for past medical history, family history, social history.  Reassessment and Plan:   On reassessment patient is completely asymptomatic.  The daughter initially noted patient was having some labored breathing earlier and they were concerned about her breathing.  I added on a CTA to evaluate possible PE although she is not hypoxic or tachycardic.  Patient's son Macdonald who is her power of attorney came to bedside as well as patient's daughter.  He states he was with her earlier and he thinks this is all due to anxiety.  He states she has actually had frequent episodes like this where she gets short of breath and anxious and symptoms will have some dizziness associated with this.  He does not want her to get any more testing as he feels like this is altered anxiety and she has PCP follow-up later this week.  Patient and her daughter are agreeable to this as well.  Her lab work is quite reassuring.  Troponin negative x 2, CBC and BMP are unremarkable.  Chest x-ray is unremarkable.  Her dizziness, chest pain have completely resolved.  She has no hypoxia or tachycardia and she has been adherent to her medication regimen for her anticoagulation.  I discussed with him that without the CT scan of her head or chest I cannot fully rule out stroke, intracranial bleeding, pneumonia, PE.  I do have relatively lower suspicion given complete resolution of her symptoms but I did discuss with him that we could miss these findings which could be potentially severe or life-threatening.  With patient's children and patient voiced understanding of this and they would like to discharge home.  I told him that they need to follow closely with her primary care doctor and return for any new or worsening symptoms.  They voiced understanding.  They were consistent in their choice to leave, I did offer additional workup which they declined.   Patient's presentation is most consistent with acute complicated illness /  injury requiring diagnostic workup.  Final Clinical Impression(s) / ED Diagnoses Final diagnoses:  Dizziness    Rx / DC Orders ED Discharge Orders     None         Nicholaus Cassondra DEL, MD 09/14/23 1739

## 2023-09-18 ENCOUNTER — Ambulatory Visit (INDEPENDENT_AMBULATORY_CARE_PROVIDER_SITE_OTHER): Payer: No Typology Code available for payment source | Admitting: Family Medicine

## 2023-09-18 ENCOUNTER — Encounter: Payer: Self-pay | Admitting: Family Medicine

## 2023-09-18 VITALS — BP 117/71 | HR 75 | Ht 64.0 in | Wt 157.8 lb

## 2023-09-18 DIAGNOSIS — F439 Reaction to severe stress, unspecified: Secondary | ICD-10-CM | POA: Diagnosis not present

## 2023-09-18 DIAGNOSIS — Z0000 Encounter for general adult medical examination without abnormal findings: Secondary | ICD-10-CM | POA: Diagnosis not present

## 2023-09-18 DIAGNOSIS — M109 Gout, unspecified: Secondary | ICD-10-CM

## 2023-09-18 MED ORDER — SERTRALINE HCL 50 MG PO TABS: 50.0000 mg | ORAL_TABLET | Freq: Every day | ORAL | 1 refills | Status: DC

## 2023-09-18 NOTE — Progress Notes (Signed)
  Date of Visit: 09/18/2023   SUBJECTIVE:   HPI:  [name] presents today for follow up. She is accompanied by her son [name].  Stress - taking sertraline 25mg  daily. Initially this helped her quite a bit but lately seems to be helping somewhat less. Has had some losses in her life of those she is close to and they have hit her pretty hard. Denies thoughts of harming herself or others. Son notes she is more tearful lately and had a "panic attack" over the weekend that prompted her to go to the ED.  Presumed gout - has been treated in ED several times for suspected gout. No recent uric acid levels. Notes flares more likely after ingesting fish.  OBJECTIVE:   BP 117/71   Pulse 75   Ht 5\' 4"  (1.626 m)   Wt 157 lb 12.8 oz (71.6 kg)   SpO2 100%   BMI 27.09 kg/m  Gen: no acute distress, pleasant cooperative HEENT: normocephalic, atraumatic  Heart: regular rate and rhythm, no murmur Lungs: clear to auscultation bilaterally, normal work of breathing  Neuro: alert, grossly nonfocal, speech normal Psych: normal range of affect, well groomed, speech normal in rate and volume, normal eye contact   ASSESSMENT/PLAN:   Assessment & Plan Gout, unspecified cause, unspecified chronicity, unspecified site Reported clinical dx of gout based on prior exams in ED. Last uric acid level was done in 2014 and was elevated at 9.5. not on any urate lowering medications at present. Check uric acid level today to guide whether she should be on allopurinol. Stress Uncontrolled; increase sertraline to 50mg  daily Follow up in 6 weeks to reassess, sooner if needed Routine adult health maintenance Reminded of shingrix vaccine available at pharmacy    FOLLOW UP: Follow up in 6 weeks for mood  [place] J. Pollie Meyer, MD [facility]

## 2023-09-18 NOTE — Patient Instructions (Signed)
It was great to see you again today.  Increase sertraline to 50mg  daily  Checking uric acid level for gout  Follow up with me in 6 weeks, sooner if needed  Be well, Dr. Pollie Meyer

## 2023-09-19 LAB — URIC ACID: Uric Acid: 5.2 mg/dL (ref 3.1–7.9)

## 2023-09-20 DIAGNOSIS — M109 Gout, unspecified: Secondary | ICD-10-CM | POA: Insufficient documentation

## 2023-09-20 NOTE — Assessment & Plan Note (Signed)
Reported clinical dx of gout based on prior exams in ED. Last uric acid level was done in 2014 and was elevated at 9.5. not on any urate lowering medications at present. Check uric acid level today to guide whether she should be on allopurinol.

## 2023-09-20 NOTE — Assessment & Plan Note (Signed)
Uncontrolled; increase sertraline to 50mg  daily Follow up in 6 weeks to reassess, sooner if needed

## 2023-09-20 NOTE — Assessment & Plan Note (Signed)
Reminded of shingrix vaccine available at pharmacy

## 2023-09-25 ENCOUNTER — Ambulatory Visit (INDEPENDENT_AMBULATORY_CARE_PROVIDER_SITE_OTHER): Payer: Medicare PPO

## 2023-09-25 ENCOUNTER — Encounter: Payer: Self-pay | Admitting: Family Medicine

## 2023-09-25 VITALS — Ht 64.0 in | Wt 157.0 lb

## 2023-09-25 DIAGNOSIS — Z0000 Encounter for general adult medical examination without abnormal findings: Secondary | ICD-10-CM

## 2023-09-25 NOTE — Patient Instructions (Addendum)
[name] , Thank you for taking time to come for your Medicare Wellness Visit. I appreciate your ongoing commitment to your health goals. Please review the following plan we discussed and let me know if I can assist you in the future.   Referrals/Orders/Follow-Ups/Clinician Recommendations: Yes; A message was sent to your PCP to resend prescription for gout to [facility] and ti place a referral to an eye doctor.  Keep maintaining your health by keeping your appointments with Dr. Pollie Meyer and any specialists that you may see.  Call us if you need anything.  Have a great year!!!!  This is a list of the screening recommended for you and due dates:  Health Maintenance  Topic Date Due   Zoster (Shingles) Vaccine (2 of 2) 04/11/2022   COVID-19 Vaccine (6 - 2024-25 season) 07/03/2023   Medicare Annual Wellness Visit  09/24/2024   DTaP/Tdap/Td vaccine (3 - Td or Tdap) 11/30/2029   Pneumonia Vaccine  Completed   Flu Shot  Completed   DEXA scan (bone density measurement)  Completed   Hepatitis C Screening  Completed   HPV Vaccine  Aged Out    Advanced directives: (Copy Requested) Please bring a copy of your health care power of attorney and living will to the office to be added to your chart at your convenience.  Next Medicare Annual Wellness Visit scheduled for next year: Yes

## 2023-09-25 NOTE — Progress Notes (Signed)
Subjective:   [name] is a 78 y.o. who presents for a Medicare Wellness preventive visit.  Visit Complete: Virtual I connected with  [name] and son, [name] on 09/25/23 by a audio enabled telemedicine application and verified that I am speaking with the correct person using two identifiers.  Patient Location: Home  Provider Location: Home Office  I discussed the limitations of evaluation and management by telemedicine. The patient expressed understanding and agreed to proceed.  Vital Signs: Because this visit was a virtual/telehealth visit, some criteria may be missing or patient reported. Any vitals not documented were not able to be obtained and vitals that have been documented are patient reported.  VideoDeclined- This patient declined Librarian, academic. Therefore the visit was completed with audio only.  AWV Questionnaire: No: Patient Medicare AWV questionnaire was not completed prior to this visit.  Cardiac Risk Factors include: advanced age (>46men, >75 women);dyslipidemia;family history of premature cardiovascular disease;hypertension;sedentary lifestyle     Objective:    Today's Vitals   09/25/23 1123  Weight: 157 lb (71.2 kg)  Height: 5\' 4"  (1.626 m)  PainSc: 0-No pain   Body mass index is 26.95 kg/m.     09/25/2023   11:28 AM 09/18/2023   11:50 AM 09/14/2023    1:21 PM 09/07/2023    1:52 PM 06/17/2023   11:12 AM 12/07/2022    8:59 AM 11/19/2022   12:26 PM  Advanced Directives  Does Patient Have a Medical Advance Directive? Yes No No No No No No  Type of Estate agent of [place];Living will        Copy of Healthcare Power of Attorney in Chart? No - copy requested        Would patient like information on creating a medical advance directive? No - Patient declined No - Patient declined No - Patient declined No - Patient declined No - Patient declined No - Patient declined No - Patient declined     Current Medications (verified) Outpatient Encounter Medications as of 09/25/2023  Medication Sig   acetaminophen (TYLENOL) 650 MG CR tablet Take 1,300 mg by mouth every 8 (eight) hours as needed for pain.   amLODipine (NORVASC) 5 MG tablet TAKE 1 TABLET BY MOUTH EVERYDAY AT BEDTIME   atorvastatin (LIPITOR) 40 MG tablet Take 1 tablet (40 mg total) by mouth daily.   ferrous sulfate 325 (65 FE) MG tablet TAKE 1 TABLET BY MOUTH EVERY OTHER DAY   sertraline (ZOLOFT) 50 MG tablet Take 1 tablet (50 mg total) by mouth daily.   XARELTO 20 MG TABS tablet TAKE 1 TABLET BY MOUTH EVERY DAY   Zoster Vaccine Adjuvanted [facility]) injection Administer Shingrix vaccination now and repeat in two months   No facility-administered encounter medications on file as of 09/25/2023.    Allergies (verified) Patient has no known allergies.   History: Past Medical History:  Diagnosis Date   Ambulates with cane    Anemia    hx of yrs ago   Anemia due to acute blood loss 10/10/2020   ANEMIA, OTHER, UNSPECIFIED 10/02/2006   Pt taking iron supplement.  Last cbc showed normal hgb.  Will recheck today.  If normal can d/c iron supplement.      Anticoagulant-induced bleeding (HCC) 10/09/2020   Arm numbness 01/27/2013   Stoke/TIA work up on 6/25-6/26 does not show any evidence of stroke on MRI, carotid dopplers normal.  Would consider cervical myelopathy as next best potential diagnosis, and neck  MRI may be useful next step   DJD (degenerative joint disease) of hip    Ganglion cyst 02/14/2013   GERD (gastroesophageal reflux disease)    h/o in the 1990s   Heart murmur    mild no cardiologist   Hyperlipidemia    Hypertension    sees Dr. 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  had retroperiotneal hemorrahge with IVC filter placement after recurrent PE/DVT   Recurrent pulmonary embolism [facility])    see pulmonologist Dr. Shan Levans   Sleep apnea    sleep study 2017 no cpap given   VTE (venous thromboembolism) 08/24/2009   Has h/o DVT and PE.  Pt on lifelong anticoagulation, currently on xarelto.  Also has IVC filter.    Wears glasses for reading   Wears partial dentures upper   Past Surgical History:  Procedure Laterality Date   BREAST BIOPSY Right 10/17/2020   ductal papilloma   CARPAL TUNNEL RELEASE  09/02/2012   Procedure: CARPAL TUNNEL RELEASE;  Surgeon: Carmela Hurt, MD;  Location: [facility];  Service: Neurosurgery;  Laterality: Right;  RIGHT carpal tunnel release   DILATION AND CURETTAGE OF UTERUS  02/21/2012   Procedure: DILATATION AND CURETTAGE;  Surgeon: Allie Bossier, MD;  Location: [facility];  Service: Gynecology;  Laterality: N/A;   Femoral vein ligation  12/2002   [place] filter  11/03/2003   R Jugular   HYSTEROSCOPY WITH D & C N/A 10/03/2020   Procedure: DILATATION AND CURETTAGE /HYSTEROSCOPY WITH MYOSURE;  Surgeon: Catalina Antigua, MD;  Location: [facility];  Service: Gynecology;  Laterality: N/A;   SHOULDER SURGERY  Left 07/15/08   Dr Elita Quick   TOTAL HIP ARTHROPLASTY  R 05/2203, L 2010   TRANSTHORACIC ECHOCARDIOGRAM     EF 55-60%, RV mod dilated, mildly increased LV wall thickness   TUBAL LIGATION  1981   Family History  Problem Relation Age of Onset   Heart disease Mother    Heart attack Mother    Heart disease Father    Heart attack Father    Cancer Sister    Alcohol abuse Sister    Cancer Brother    Colon cancer Neg Hx    Colon polyps Neg Hx    Esophageal cancer Neg Hx    Rectal cancer Neg Hx    Stomach cancer Neg Hx    Social History    Socioeconomic History   Marital status: Widowed    Spouse name: Not on file   Number of children: 4   Years of education: College   Highest education level: Associate degree: occupational, Scientist, product/process development, or vocational program  Occupational History    Employer: RETIRED  Tobacco Use   Smoking status: Never    Passive exposure: Never   Smokeless tobacco: Never  Vaping Use   Vaping status: Never Used  Substance and Sexual Activity   Alcohol use: No   Drug use: No   Sexual activity: Not Currently    Birth control/protection: Post-menopausal  Other Topics Concern   Not on file  Social  History Narrative   Patient lives with her son and adopted daughter with special needs.    Patient is active in her church and with her family.   Patient enjoys sewing, crocheting, cooking, and spending time with family.    Patient has 2 biological sons and 2 adopted daughters.          Social Drivers of Corporate investment banker Strain: Low Risk  (09/25/2023)   Overall Financial Resource Strain (CARDIA)    Difficulty of Paying Living Expenses: Not hard at all  Food Insecurity: No Food Insecurity (09/25/2023)   Hunger Vital Sign    Worried About Running Out of Food in the Last Year: Never true    Ran Out of Food in the Last Year: Never true  Transportation Needs: No Transportation Needs (09/25/2023)   PRAPARE - Administrator, Civil Service (Medical): No    Lack of Transportation (Non-Medical): No  Physical Activity: Inactive (09/25/2023)   Exercise Vital Sign    Days of Exercise per Week: 0 days    Minutes of Exercise per Session: 0 min  Stress: No Stress Concern Present (09/25/2023)   [employer] of Occupational Health - Occupational Stress Questionnaire    Feeling of Stress : Not at all  Social Connections: Moderately Integrated (09/25/2023)   Social Connection and Isolation Panel [NHANES]    Frequency of Communication with Friends and Family: More than three times a week     Frequency of Social Gatherings with Friends and Family: More than three times a week    Attends Religious Services: More than 4 times per year    Active Member of [employer] or Organizations: Yes    Attends Banker Meetings: Never    Marital Status: Widowed    Tobacco Counseling Counseling given: Not Answered    Clinical Intake:  Pre-visit preparation completed: Yes  Pain : No/denies pain Pain Score: 0-No pain     Nutritional Risks: None Diabetes: No  How often do you need to have someone help you when you read instructions, pamphlets, or other written materials from your doctor or pharmacy?: 1 - Never What is the last grade level you completed in school?: HSG; COSMETOLOGY GRADUATE  Interpreter Needed?: No  Information entered by :: Yadriel Kerrigan N. Danford Tat, LPN.   Activities of Daily Living     09/25/2023   11:32 AM  In your present state of health, do you have any difficulty performing the following activities:  Hearing? 0  Vision? 0  Difficulty concentrating or making decisions? 1  Walking or climbing stairs? 1  Dressing or bathing? 0  Doing errands, shopping? 1  Comment Patient does not drive; Patient has a nurse to help her  Preparing Food and eating ? N  Using the Toilet? N  In the past six months, have you accidently leaked urine? N  Do you have problems with loss of bowel control? N  Managing your Medications? Y  Comment uses a pill reminder box  Managing your Finances? Y  Comment son in charge of finances  Housekeeping or managing your Housekeeping? Y    Patient Care Team: Latrelle Dodrill, MD as PCP - General (Pediatrics) Thomasene Ripple, DO as PCP - Cardiology (Cardiology) Lupita Leash, MD as Consulting Physician (Pulmonary Disease) Constant, Gigi Gin, MD as Consulting Physician (Obstetrics and Gynecology)  Indicate any recent Medical Services you may have received from other than [facility] providers in the past year (date may be  approximate).  Assessment:   This is a routine wellness examination for Kalia.  Hearing/Vision screen Hearing Screening - Comments:: Denies hearing difficulties. No hearing aids.   Vision Screening - Comments:: Wears rx glasses for reading- not up to date with routine eye exams. Requesting referral to ophthalmologist.    Goals Addressed             This Visit's Progress    Client and son understands the importance of follow-up with providers by attending scheduled visits.         Depression Screen     09/25/2023   11:30 AM 09/18/2023   11:48 AM 06/17/2023   11:11 AM 10/04/2022   10:45 AM 09/13/2022    3:15 PM 07/04/2022   10:30 AM 02/14/2022    1:46 PM  PHQ 2/9 Scores  PHQ - 2 Score  0 0 0 0 0 0  PHQ- 9 Score  0 0 0  3 1  Exception Documentation Other- indicate reason in comment box        Not completed Patient has a diagnosis of cognitive impairment.  Patient not able to complete depression screening.          Fall Risk     09/25/2023   11:29 AM 09/18/2023   11:48 AM 06/17/2023   11:11 AM 05/08/2023   11:12 AM 10/04/2022   10:45 AM  Fall Risk   Falls in the past year? 0 0 0 0 0  Number falls in past yr: 0 0 0 0 0  Injury with Fall? 0 0 0  0  Risk for fall due to : No Fall Risks No Fall Risks No Fall Risks    Follow up Falls prevention discussed;Falls evaluation completed Falls evaluation completed Falls evaluation completed      MEDICARE RISK AT HOME:  Medicare Risk at Home Any stairs in or around the home?: No If so, are there any without handrails?: No Home free of loose throw rugs in walkways, pet beds, electrical cords, etc?: Yes Adequate lighting in your home to reduce risk of falls?: Yes Life alert?: No (cameras in the home) Use of a cane, walker or w/c?: Yes Grab bars in the bathroom?: No Shower chair or bench in shower?: Yes Elevated toilet seat or a handicapped toilet?: Yes (extender over the commode)  TIMED UP AND GO:  Was the test performed?   No  Cognitive Function: Unable to complete due to cognitive impairment.    09/25/2023   11:34 AM  MMSE - Mini Mental State Exam  Not completed: Unable to complete        09/25/2023   11:34 AM 09/13/2022    3:17 PM 10/05/2020    9:35 AM  6CIT Screen  What Year? 0 points 0 points 0 points  What month? 0 points 0 points 0 points  What time? 0 points 0 points 0 points  Count back from 20 0 points 0 points 0 points  Months in reverse 0 points 0 points 0 points  Repeat phrase 0 points 10 points 0 points  Total Score 0 points 10 points 0 points    Immunizations Immunization History  Administered Date(s) Administered   Fluad Quad(high Dose 65+) 06/14/2021   Fluad Trivalent(High Dose 65+) 05/08/2023   Influenza Split 08/15/2011   Influenza Whole 05/06/2008, 08/24/2009, 05/05/2012   Influenza, High Dose Seasonal PF 04/08/2018   Influenza,inj,Quad PF,6+ Mos 04/22/2013, 08/15/2014, 05/19/2015, 05/05/2016, 05/09/2017   Influenza-Unspecified 04/04/2020, 06/05/2022   PFIZER(Purple Top)SARS-COV-2 Vaccination 02/03/2020, 02/24/2020,  06/26/2020   Pfizer(Comirnaty)Fall Seasonal Vaccine 12 years and older 06/05/2022, 05/08/2023   Pneumococcal Conjugate-13 08/15/2014   Pneumococcal Polysaccharide-23 07/05/2009, 05/09/2017   Td 08/06/1999   Tdap 12/01/2019   Zoster Recombinant(Shingrix) 02/14/2022    Screening Tests Health Maintenance  Topic Date Due   Zoster Vaccines- Shingrix (2 of 2) 04/11/2022   COVID-19 Vaccine (6 - 2024-25 season) 07/03/2023   Medicare Annual Wellness (AWV)  09/24/2024   DTaP/Tdap/Td (3 - Td or Tdap) 11/30/2029   Pneumonia Vaccine 52+ Years old  Completed   INFLUENZA VACCINE  Completed   DEXA SCAN  Completed   Hepatitis C Screening  Completed   HPV VACCINES  Aged Out    Health Maintenance  Health Maintenance Due  Topic Date Due   Zoster Vaccines- Shingrix (2 of 2) 04/11/2022   COVID-19 Vaccine (6 - 2024-25 season) 07/03/2023   Health Maintenance Items  Addressed: Patient is due for her second Shingrix vaccine.  Patient's son is requesting a referral to an opthmaologist for eye exam.  Additional Screening:  Vision Screening: Recommended annual ophthalmology exams for early detection of glaucoma and other disorders of the eye.  Dental Screening: Recommended annual dental exams for proper oral hygiene  Community Resource Referral / Chronic Care Management: CRR required this visit?  No   CCM required this visit?  No     Plan:     I have personally reviewed and noted the following in the patient's chart:   Medical and social history Use of alcohol, tobacco or illicit drugs  Current medications and supplements including opioid prescriptions. Patient is not currently taking opioid prescriptions. Functional ability and status Nutritional status Physical activity Advanced directives List of other physicians Hospitalizations, surgeries, and ER visits in previous 12 months Vitals Screenings to include cognitive, depression, and falls Referrals and appointments  In addition, I have reviewed and discussed with patient certain preventive protocols, quality metrics, and best practice recommendations. A written personalized care plan for preventive services as well as general preventive health recommendations were provided to patient.     Mickeal Needy, LPN   [id number]   After Visit Summary: (MyChart) Due to this being a telephonic visit, the after visit summary with patients personalized plan was offered to patient via MyChart   Notes: Please refer to Routing Comments.

## 2023-09-30 ENCOUNTER — Telehealth: Payer: Self-pay

## 2023-09-30 NOTE — Telephone Encounter (Signed)
 Patient's son calls nurse line regarding gout medication.   He states that he contacted the pharmacy, however, they did not have this prescription.   Advised of normal uric acid level and that I was unsure if provider was still going to be sending in medication given these results.   Will forward to PCP for clarification.   Veronda Prude, RN

## 2023-10-01 NOTE — Telephone Encounter (Signed)
 Called patients son.   Discussed medication is not needed with normal uric acid levels.  He was appreciative of call.

## 2023-10-11 ENCOUNTER — Other Ambulatory Visit: Payer: Self-pay | Admitting: Family Medicine

## 2023-10-13 ENCOUNTER — Other Ambulatory Visit: Payer: Self-pay | Admitting: Family Medicine

## 2023-10-13 DIAGNOSIS — Z1231 Encounter for screening mammogram for malignant neoplasm of breast: Secondary | ICD-10-CM

## 2023-10-30 ENCOUNTER — Ambulatory Visit: Payer: No Typology Code available for payment source | Admitting: Family Medicine

## 2023-11-11 ENCOUNTER — Ambulatory Visit

## 2023-11-13 ENCOUNTER — Ambulatory Visit: Payer: No Typology Code available for payment source | Admitting: Family Medicine

## 2023-12-23 ENCOUNTER — Encounter: Payer: Self-pay | Admitting: Student

## 2023-12-23 ENCOUNTER — Ambulatory Visit (INDEPENDENT_AMBULATORY_CARE_PROVIDER_SITE_OTHER): Admitting: Student

## 2023-12-23 VITALS — BP 151/60 | HR 87 | Ht 64.0 in | Wt 161.5 lb

## 2023-12-23 DIAGNOSIS — M25562 Pain in left knee: Secondary | ICD-10-CM

## 2023-12-23 DIAGNOSIS — M25561 Pain in right knee: Secondary | ICD-10-CM | POA: Diagnosis not present

## 2023-12-23 DIAGNOSIS — M170 Bilateral primary osteoarthritis of knee: Secondary | ICD-10-CM | POA: Diagnosis not present

## 2023-12-23 MED ORDER — ACETAMINOPHEN 325 MG PO TABS: 650.0000 mg | ORAL_TABLET | Freq: Once | ORAL | Status: DC

## 2023-12-23 MED ORDER — ACETAMINOPHEN 500 MG PO TABS
500.0000 mg | ORAL_TABLET | Freq: Once | ORAL | Status: AC
Start: 2023-12-23 — End: 2023-12-23
  Administered 2023-12-23: 500 mg via ORAL

## 2023-12-23 NOTE — Assessment & Plan Note (Addendum)
 Patient comes in for follow-up knee pain.  Patient appreciates she had a recent weekend full of walking up stairs, walking long distances that it flared up her arthritis.  Patient appreciates pain in both knees, more so on the right, that is treated with 650 of Tylenol  every 8 hours.  Suspect patient is having arthritis flare, this seems to be well treated by Tylenol .  Will recommend Tylenol  every 8 hours for pain.  Will also recommend patient follow-up with sports medicine for steroid injection if symptoms persist past 2 to 3 days.  Located in right knee, left knee with benign exam.  Patient had tenderness to medial lateral and patellar areas of knee, concerning most for OA.  Low concern for septic arthritis/infection given lack of symptoms and response to Tylenol . - 650 mg Tylenol  every 8 hours as needed x 2 to 3 days - Follow-up sports medicine for steroid injection as needed if flare continues past 2 to 3 days

## 2023-12-23 NOTE — Progress Notes (Signed)
  SUBJECTIVE:   CHIEF COMPLAINT / HPI:   B/l leg pain Patient comes in with concern for knee pain, this been an issue for years, but worse now acutely.  Patient appreciates that she was walking up steps for her family's home multiple times over the weekend, and knee started to hurt after that.  Patient also appreciates that she had to walk long distance the other night and pain was worse after that.  Patient appreciates that she takes Tylenol , 650 mg every 8 hours, and this improves the pain entirely.  Patient denies any falls, or injuries, but does appreciate buckling of knees at times.  PERTINENT  PMH / PSH:   OBJECTIVE:  BP (!) 151/60   Pulse 87   Ht 5\' 4"  (1.626 m)   Wt 161 lb 8 oz (73.3 kg)   SpO2 100%   BMI 27.72 kg/m  Physical Exam Musculoskeletal:     Right knee: No swelling, deformity, effusion, erythema, ecchymosis, lacerations, bony tenderness or crepitus. Normal range of motion. Tenderness present over the medial joint line, lateral joint line and patellar tendon. No MCL, LCL, ACL or PCL tenderness. No LCL laxity, MCL laxity, ACL laxity or PCL laxity. Normal alignment.     Instability Tests: Anterior drawer test negative. Posterior drawer test negative. Anterior Lachman test negative.     Left knee: Normal. No swelling, deformity, effusion, erythema, lacerations, bony tenderness or crepitus. Normal range of motion. No tenderness. No LCL laxity, MCL laxity, ACL laxity or PCL laxity.Normal alignment.     Instability Tests: Anterior drawer test negative. Posterior drawer test negative. Anterior Lachman test negative.      ASSESSMENT/PLAN:   Assessment & Plan Osteoarthritis of both knees, unspecified osteoarthritis type Patient comes in for follow-up knee pain.  Patient appreciates she had a recent weekend full of walking up stairs, walking long distances that it flared up her arthritis.  Patient appreciates pain in both knees, more so on the right, that is treated with 650 of  Tylenol  every 8 hours.  Suspect patient is having arthritis flare, this seems to be well treated by Tylenol .  Will recommend Tylenol  every 8 hours for pain.  Will also recommend patient follow-up with sports medicine for steroid injection if symptoms persist past 2 to 3 days.  Located in right knee, left knee with benign exam.  Patient had tenderness to medial lateral and patellar areas of knee, concerning most for OA.  Low concern for septic arthritis/infection given lack of symptoms and response to Tylenol . - 650 mg Tylenol  every 8 hours as needed x 2 to 3 days - Follow-up sports medicine for steroid injection as needed if flare continues past 2 to 3 days No follow-ups on file. Wilhemena Harbour, MD 12/23/2023, 3:57 PM PGY-3, [facility]

## 2023-12-23 NOTE — Patient Instructions (Signed)
 It was great to see you! Thank you for allowing me to participate in your care!  I recommend that you always bring your medications to each appointment as this makes it easy to ensure we are on the correct medications and helps us  not miss when refills are needed.  Our plans for today:  - Knee Pain  It looks like you have a flare of your arthritis going on.  The Tylenol  will help, and this can get better on its own.  If not we will send you to sports medicine.   Take Tylenol  500-650 mg every 8 hours as needed   Make follow up appointment with Sport's Med, if flare last next 2-3 days.     [facility]    Address: [address] [place], [place], [place] [address] Phone: [phone]  Take care and seek immediate care sooner if you develop any concerns.   Dr. Wilhemena Harbour, MD [facility]

## 2024-01-09 ENCOUNTER — Other Ambulatory Visit: Payer: Self-pay | Admitting: Family Medicine

## 2024-01-10 ENCOUNTER — Other Ambulatory Visit: Payer: Self-pay | Admitting: Family Medicine

## 2024-01-13 NOTE — Telephone Encounter (Signed)
Please let patient know I am refilling this medication, but she needs to schedule an appointment with me.   Thanks, Leeanne Rio, MD

## 2024-01-27 ENCOUNTER — Encounter: Payer: Self-pay | Admitting: Family Medicine

## 2024-01-27 ENCOUNTER — Ambulatory Visit (INDEPENDENT_AMBULATORY_CARE_PROVIDER_SITE_OTHER): Admitting: Family Medicine

## 2024-01-27 VITALS — BP 136/65 | HR 81 | Ht 64.0 in | Wt 168.2 lb

## 2024-01-27 DIAGNOSIS — Z0000 Encounter for general adult medical examination without abnormal findings: Secondary | ICD-10-CM | POA: Diagnosis not present

## 2024-01-27 DIAGNOSIS — F439 Reaction to severe stress, unspecified: Secondary | ICD-10-CM

## 2024-01-27 DIAGNOSIS — M79604 Pain in right leg: Secondary | ICD-10-CM | POA: Diagnosis not present

## 2024-01-27 MED ORDER — SERTRALINE HCL 50 MG PO TABS: 50.0000 mg | ORAL_TABLET | Freq: Every day | ORAL | 1 refills | Status: DC

## 2024-01-27 NOTE — Progress Notes (Unsigned)
  Date of Visit: 01/27/2024   SUBJECTIVE:   HPI:  [name] presents today for follow-up.  Accompanied by her adult son Lenwood.  Leg pain: Was seen previously for pain in her right leg.  Was advised to take Tylenol , which she is taking daily.  It greatly helps.  Pain is now manageable.  Mood: Taking sertraline  50 mg daily.  Tolerating this well.  Reports mood is well-controlled.  Denies thoughts of harming herself or others.   OBJECTIVE:   BP 136/65   Pulse 81   Ht 5' 4 (1.626 m)   Wt 168 lb 3.2 oz (76.3 kg)   SpO2 100%   BMI 28.87 kg/m  Gen: No acute distress, pleasant, cooperative, well-appearing HEENT: Normocephalic, atraumatic Heart: Regular rate and rhythm, no murmur Lungs: Clear bilaterally, normal effort Neuro: Grossly nonfocal, speech normal Ext: Right leg without warmth or swelling.  Mild tenderness palpation of R knee joint line. Psych: normal range of affect, well groomed, speech normal in rate and volume, normal eye contact   ASSESSMENT/PLAN:   Assessment & Plan Stress Doing well, continue current dosing of sertraline .  Refilled. Routine adult health maintenance Completed shingrix  series, dates of vaccines abstracted into EMR from [facility] Right leg pain Improved with daily Tylenol .  Advised okay to continue this.    FOLLOW UP: Follow up in 6 months for above issues  [place] J. Donah, MD [facility]

## 2024-01-27 NOTE — Patient Instructions (Signed)
It was great to see you again today.  Stay on current medications Follow up with me in 6 months, sooner if needed  Be well, Dr. Pollie Meyer

## 2024-01-29 NOTE — Assessment & Plan Note (Signed)
 Completed shingrix  series, dates of vaccines abstracted into EMR from [facility]

## 2024-01-29 NOTE — Assessment & Plan Note (Signed)
 Improved with daily Tylenol .  Advised okay to continue this.

## 2024-01-29 NOTE — Assessment & Plan Note (Signed)
 Doing well, continue current dosing of sertraline .  Refilled.

## 2024-04-04 ENCOUNTER — Other Ambulatory Visit: Payer: Self-pay | Admitting: Family Medicine

## 2024-04-09 ENCOUNTER — Other Ambulatory Visit: Payer: Self-pay | Admitting: Family Medicine

## 2024-04-12 MED ORDER — SERTRALINE HCL 50 MG PO TABS: 50.0000 mg | ORAL_TABLET | Freq: Every day | ORAL | 1 refills | Status: AC

## 2024-05-17 ENCOUNTER — Telehealth: Payer: Self-pay

## 2024-05-17 NOTE — Telephone Encounter (Signed)
 Patient's son LVM nurse line regarding concerns with patient's medications.   Patient was given medications twice today and yesterday.   Attempted to call him back, however, he did not answer. LVM asking him to call back to office.   Chiquita JAYSON English, RN

## 2024-05-17 NOTE — Telephone Encounter (Signed)
 Patients son returns call to nurse line.   He reports she took amlodipine , atorvastatin , sertraline  and xarelto  ~1:30pm yesterday and another round of above medications ~30 minutes later.  He reports miscommunication with his cousin who also helps care for her.   He reports she took above medications again today as normal.   He reports she is acting normal, no apparent adverse side effects.   He reports he is with her now and she is up doing her hair.   ED precautions discussed with son for dizziness and abnormal bleeding.   Advised will forward to PCP and Dr. Koval.

## 2024-05-17 NOTE — Telephone Encounter (Signed)
 Discussed recommendations with patients son.

## 2024-06-25 ENCOUNTER — Encounter: Payer: Self-pay | Admitting: Pharmacist

## 2024-06-25 NOTE — Progress Notes (Signed)
 This patient is appearing on a report for being at risk of failing the adherence measure for cholesterol (statin) medications this calendar year.   Medication: atorvastatin  40 mg  Last fill date: 04/12/24 for 90 day supply  Chart review completed

## 2024-07-04 ENCOUNTER — Other Ambulatory Visit: Payer: Self-pay | Admitting: Family Medicine
